# Patient Record
Sex: Female | Born: 1935 | Race: Black or African American | Hispanic: No | Smoking: Former smoker
Health system: Southern US, Community
[De-identification: ages and names within clinical notes are randomized; demographics above are authoritative.]

## PROBLEM LIST (undated history)

## (undated) DIAGNOSIS — R12 Heartburn: Secondary | ICD-10-CM

## (undated) DIAGNOSIS — K219 Gastro-esophageal reflux disease without esophagitis: Secondary | ICD-10-CM

## (undated) DIAGNOSIS — R51 Headache: Secondary | ICD-10-CM

## (undated) DIAGNOSIS — M1990 Unspecified osteoarthritis, unspecified site: Secondary | ICD-10-CM

## (undated) DIAGNOSIS — F419 Anxiety disorder, unspecified: Secondary | ICD-10-CM

## (undated) DIAGNOSIS — R0600 Dyspnea, unspecified: Secondary | ICD-10-CM

## (undated) DIAGNOSIS — N950 Postmenopausal bleeding: Secondary | ICD-10-CM

## (undated) DIAGNOSIS — M4310 Spondylolisthesis, site unspecified: Secondary | ICD-10-CM

## (undated) DIAGNOSIS — G4730 Sleep apnea, unspecified: Secondary | ICD-10-CM

## (undated) DIAGNOSIS — T7840XA Allergy, unspecified, initial encounter: Secondary | ICD-10-CM

## (undated) DIAGNOSIS — E039 Hypothyroidism, unspecified: Secondary | ICD-10-CM

## (undated) DIAGNOSIS — F0390 Unspecified dementia without behavioral disturbance: Secondary | ICD-10-CM

## (undated) DIAGNOSIS — C50919 Malignant neoplasm of unspecified site of unspecified female breast: Secondary | ICD-10-CM

## (undated) DIAGNOSIS — I10 Essential (primary) hypertension: Secondary | ICD-10-CM

## (undated) DIAGNOSIS — F329 Major depressive disorder, single episode, unspecified: Secondary | ICD-10-CM

## (undated) DIAGNOSIS — D649 Anemia, unspecified: Secondary | ICD-10-CM

## (undated) DIAGNOSIS — R252 Cramp and spasm: Secondary | ICD-10-CM

## (undated) DIAGNOSIS — R011 Cardiac murmur, unspecified: Secondary | ICD-10-CM

## (undated) DIAGNOSIS — G2581 Restless legs syndrome: Secondary | ICD-10-CM

## (undated) DIAGNOSIS — F32A Depression, unspecified: Secondary | ICD-10-CM

## (undated) DIAGNOSIS — N189 Chronic kidney disease, unspecified: Secondary | ICD-10-CM

## (undated) HISTORY — PX: OTHER SURGICAL HISTORY: SHX169

## (undated) HISTORY — DX: Spondylolisthesis, site unspecified: M43.10

## (undated) HISTORY — DX: Cramp and spasm: R25.2

## (undated) HISTORY — DX: Unspecified osteoarthritis, unspecified site: M19.90

## (undated) HISTORY — DX: Gastro-esophageal reflux disease without esophagitis: K21.9

## (undated) HISTORY — DX: Anxiety disorder, unspecified: F41.9

## (undated) HISTORY — DX: Restless legs syndrome: G25.81

## (undated) HISTORY — DX: Postmenopausal bleeding: N95.0

## (undated) HISTORY — DX: Hypothyroidism, unspecified: E03.9

## (undated) HISTORY — DX: Chronic kidney disease, unspecified: N18.9

## (undated) HISTORY — DX: Anemia, unspecified: D64.9

## (undated) HISTORY — DX: Major depressive disorder, single episode, unspecified: F32.9

## (undated) HISTORY — DX: Allergy, unspecified, initial encounter: T78.40XA

## (undated) HISTORY — DX: Cardiac murmur, unspecified: R01.1

## (undated) HISTORY — DX: Malignant neoplasm of unspecified site of unspecified female breast: C50.919

## (undated) HISTORY — DX: Depression, unspecified: F32.A

## (undated) HISTORY — DX: Heartburn: R12

## (undated) HISTORY — PX: BREAST LUMPECTOMY: SHX2

## (undated) HISTORY — DX: Essential (primary) hypertension: I10

## (undated) HISTORY — PX: CATARACT EXTRACTION, BILATERAL: SHX1313

## (undated) HISTORY — DX: Unspecified dementia, unspecified severity, without behavioral disturbance, psychotic disturbance, mood disturbance, and anxiety: F03.90

## (undated) HISTORY — PX: BREAST SURGERY: SHX581

---

## 2011-01-15 HISTORY — PX: BREAST SURGERY: SHX581

## 2013-09-13 LAB — HM MAMMOGRAPHY: HM Mammogram: NORMAL (ref 0–4)

## 2013-09-13 LAB — HM COLONOSCOPY

## 2014-01-22 DIAGNOSIS — I10 Essential (primary) hypertension: Secondary | ICD-10-CM | POA: Diagnosis not present

## 2014-01-22 DIAGNOSIS — M839 Adult osteomalacia, unspecified: Secondary | ICD-10-CM | POA: Diagnosis not present

## 2014-01-22 DIAGNOSIS — N184 Chronic kidney disease, stage 4 (severe): Secondary | ICD-10-CM | POA: Diagnosis not present

## 2014-01-25 DIAGNOSIS — I10 Essential (primary) hypertension: Secondary | ICD-10-CM | POA: Diagnosis not present

## 2014-01-25 DIAGNOSIS — M839 Adult osteomalacia, unspecified: Secondary | ICD-10-CM | POA: Diagnosis not present

## 2014-01-25 DIAGNOSIS — R7309 Other abnormal glucose: Secondary | ICD-10-CM | POA: Diagnosis not present

## 2014-01-25 DIAGNOSIS — N183 Chronic kidney disease, stage 3 (moderate): Secondary | ICD-10-CM | POA: Diagnosis not present

## 2014-01-25 DIAGNOSIS — E785 Hyperlipidemia, unspecified: Secondary | ICD-10-CM | POA: Diagnosis not present

## 2014-03-04 DIAGNOSIS — I10 Essential (primary) hypertension: Secondary | ICD-10-CM | POA: Diagnosis not present

## 2014-03-04 DIAGNOSIS — M25512 Pain in left shoulder: Secondary | ICD-10-CM | POA: Diagnosis not present

## 2014-03-19 DIAGNOSIS — R7309 Other abnormal glucose: Secondary | ICD-10-CM | POA: Diagnosis not present

## 2014-03-19 DIAGNOSIS — I10 Essential (primary) hypertension: Secondary | ICD-10-CM | POA: Diagnosis not present

## 2014-03-19 DIAGNOSIS — N183 Chronic kidney disease, stage 3 (moderate): Secondary | ICD-10-CM | POA: Diagnosis not present

## 2014-03-19 DIAGNOSIS — M839 Adult osteomalacia, unspecified: Secondary | ICD-10-CM | POA: Diagnosis not present

## 2014-03-19 DIAGNOSIS — E785 Hyperlipidemia, unspecified: Secondary | ICD-10-CM | POA: Diagnosis not present

## 2014-03-22 DIAGNOSIS — N183 Chronic kidney disease, stage 3 (moderate): Secondary | ICD-10-CM | POA: Diagnosis not present

## 2014-03-22 DIAGNOSIS — I10 Essential (primary) hypertension: Secondary | ICD-10-CM | POA: Diagnosis not present

## 2014-03-22 DIAGNOSIS — R7309 Other abnormal glucose: Secondary | ICD-10-CM | POA: Diagnosis not present

## 2014-03-22 DIAGNOSIS — M839 Adult osteomalacia, unspecified: Secondary | ICD-10-CM | POA: Diagnosis not present

## 2014-03-26 DIAGNOSIS — Z1231 Encounter for screening mammogram for malignant neoplasm of breast: Secondary | ICD-10-CM | POA: Diagnosis not present

## 2014-04-18 DIAGNOSIS — C50912 Malignant neoplasm of unspecified site of left female breast: Secondary | ICD-10-CM | POA: Diagnosis not present

## 2014-05-12 DIAGNOSIS — Z8669 Personal history of other diseases of the nervous system and sense organs: Secondary | ICD-10-CM | POA: Diagnosis not present

## 2014-05-12 DIAGNOSIS — H6122 Impacted cerumen, left ear: Secondary | ICD-10-CM | POA: Diagnosis not present

## 2014-05-12 DIAGNOSIS — R42 Dizziness and giddiness: Secondary | ICD-10-CM | POA: Diagnosis not present

## 2014-06-02 DIAGNOSIS — I10 Essential (primary) hypertension: Secondary | ICD-10-CM | POA: Diagnosis not present

## 2014-06-02 DIAGNOSIS — E785 Hyperlipidemia, unspecified: Secondary | ICD-10-CM | POA: Diagnosis not present

## 2014-06-02 DIAGNOSIS — H8113 Benign paroxysmal vertigo, bilateral: Secondary | ICD-10-CM | POA: Diagnosis not present

## 2014-06-02 DIAGNOSIS — L749 Eccrine sweat disorder, unspecified: Secondary | ICD-10-CM | POA: Diagnosis not present

## 2014-06-02 DIAGNOSIS — M25512 Pain in left shoulder: Secondary | ICD-10-CM | POA: Diagnosis not present

## 2014-09-14 DIAGNOSIS — M25551 Pain in right hip: Secondary | ICD-10-CM | POA: Diagnosis not present

## 2014-09-14 DIAGNOSIS — Z853 Personal history of malignant neoplasm of breast: Secondary | ICD-10-CM | POA: Diagnosis not present

## 2014-09-14 DIAGNOSIS — G2581 Restless legs syndrome: Secondary | ICD-10-CM | POA: Diagnosis not present

## 2014-09-14 DIAGNOSIS — I10 Essential (primary) hypertension: Secondary | ICD-10-CM | POA: Diagnosis not present

## 2014-10-04 DIAGNOSIS — M170 Bilateral primary osteoarthritis of knee: Secondary | ICD-10-CM | POA: Diagnosis not present

## 2014-10-05 DIAGNOSIS — M222X2 Patellofemoral disorders, left knee: Secondary | ICD-10-CM | POA: Diagnosis not present

## 2014-10-05 DIAGNOSIS — M222X1 Patellofemoral disorders, right knee: Secondary | ICD-10-CM | POA: Diagnosis not present

## 2014-10-05 DIAGNOSIS — F338 Other recurrent depressive disorders: Secondary | ICD-10-CM | POA: Diagnosis not present

## 2014-10-05 DIAGNOSIS — R262 Difficulty in walking, not elsewhere classified: Secondary | ICD-10-CM | POA: Diagnosis not present

## 2014-10-07 DIAGNOSIS — E2839 Other primary ovarian failure: Secondary | ICD-10-CM | POA: Diagnosis not present

## 2014-10-07 DIAGNOSIS — G2581 Restless legs syndrome: Secondary | ICD-10-CM | POA: Diagnosis not present

## 2014-10-07 DIAGNOSIS — F419 Anxiety disorder, unspecified: Secondary | ICD-10-CM | POA: Diagnosis not present

## 2014-10-07 DIAGNOSIS — I10 Essential (primary) hypertension: Secondary | ICD-10-CM | POA: Diagnosis not present

## 2014-10-07 DIAGNOSIS — Z0000 Encounter for general adult medical examination without abnormal findings: Secondary | ICD-10-CM | POA: Diagnosis not present

## 2014-10-07 HISTORY — DX: Restless legs syndrome: G25.81

## 2014-10-10 DIAGNOSIS — F338 Other recurrent depressive disorders: Secondary | ICD-10-CM | POA: Diagnosis not present

## 2014-10-10 DIAGNOSIS — R262 Difficulty in walking, not elsewhere classified: Secondary | ICD-10-CM | POA: Diagnosis not present

## 2014-10-10 DIAGNOSIS — M222X2 Patellofemoral disorders, left knee: Secondary | ICD-10-CM | POA: Diagnosis not present

## 2014-10-10 DIAGNOSIS — M222X1 Patellofemoral disorders, right knee: Secondary | ICD-10-CM | POA: Diagnosis not present

## 2014-10-12 DIAGNOSIS — R262 Difficulty in walking, not elsewhere classified: Secondary | ICD-10-CM | POA: Diagnosis not present

## 2014-10-12 DIAGNOSIS — M222X2 Patellofemoral disorders, left knee: Secondary | ICD-10-CM | POA: Diagnosis not present

## 2014-10-12 DIAGNOSIS — M222X1 Patellofemoral disorders, right knee: Secondary | ICD-10-CM | POA: Diagnosis not present

## 2014-10-12 DIAGNOSIS — F338 Other recurrent depressive disorders: Secondary | ICD-10-CM | POA: Diagnosis not present

## 2014-10-13 ENCOUNTER — Other Ambulatory Visit: Payer: Self-pay | Admitting: Family Medicine

## 2014-10-13 DIAGNOSIS — E2839 Other primary ovarian failure: Secondary | ICD-10-CM

## 2014-10-19 DIAGNOSIS — M222X1 Patellofemoral disorders, right knee: Secondary | ICD-10-CM | POA: Diagnosis not present

## 2014-10-19 DIAGNOSIS — F338 Other recurrent depressive disorders: Secondary | ICD-10-CM | POA: Diagnosis not present

## 2014-10-19 DIAGNOSIS — M222X2 Patellofemoral disorders, left knee: Secondary | ICD-10-CM | POA: Diagnosis not present

## 2014-10-19 DIAGNOSIS — R262 Difficulty in walking, not elsewhere classified: Secondary | ICD-10-CM | POA: Diagnosis not present

## 2014-10-25 DIAGNOSIS — R262 Difficulty in walking, not elsewhere classified: Secondary | ICD-10-CM | POA: Diagnosis not present

## 2014-10-25 DIAGNOSIS — M222X1 Patellofemoral disorders, right knee: Secondary | ICD-10-CM | POA: Diagnosis not present

## 2014-10-25 DIAGNOSIS — M222X2 Patellofemoral disorders, left knee: Secondary | ICD-10-CM | POA: Diagnosis not present

## 2014-10-25 DIAGNOSIS — F338 Other recurrent depressive disorders: Secondary | ICD-10-CM | POA: Diagnosis not present

## 2014-10-26 DIAGNOSIS — R2681 Unsteadiness on feet: Secondary | ICD-10-CM | POA: Diagnosis not present

## 2014-10-26 DIAGNOSIS — R42 Dizziness and giddiness: Secondary | ICD-10-CM | POA: Diagnosis not present

## 2014-10-26 DIAGNOSIS — Z23 Encounter for immunization: Secondary | ICD-10-CM | POA: Diagnosis not present

## 2014-10-26 DIAGNOSIS — R51 Headache: Secondary | ICD-10-CM | POA: Diagnosis not present

## 2014-10-26 DIAGNOSIS — I10 Essential (primary) hypertension: Secondary | ICD-10-CM | POA: Diagnosis not present

## 2014-10-31 ENCOUNTER — Ambulatory Visit
Admission: RE | Admit: 2014-10-31 | Discharge: 2014-10-31 | Disposition: A | Discharge status: Home / Self Care | Payer: Medicare Other | Source: Ambulatory Visit | Attending: Family Medicine | Admitting: Family Medicine

## 2014-10-31 DIAGNOSIS — E2839 Other primary ovarian failure: Secondary | ICD-10-CM

## 2014-10-31 DIAGNOSIS — Z780 Asymptomatic menopausal state: Secondary | ICD-10-CM | POA: Diagnosis not present

## 2014-10-31 LAB — HM DEXA SCAN

## 2014-11-10 ENCOUNTER — Other Ambulatory Visit: Payer: Self-pay | Admitting: Family Medicine

## 2014-11-10 DIAGNOSIS — R51 Headache: Principal | ICD-10-CM

## 2014-11-10 DIAGNOSIS — R519 Headache, unspecified: Secondary | ICD-10-CM

## 2014-11-10 DIAGNOSIS — R2681 Unsteadiness on feet: Secondary | ICD-10-CM

## 2014-11-10 DIAGNOSIS — R42 Dizziness and giddiness: Secondary | ICD-10-CM

## 2014-11-14 ENCOUNTER — Other Ambulatory Visit: Payer: Self-pay | Admitting: Family Medicine

## 2014-11-14 DIAGNOSIS — R51 Headache: Principal | ICD-10-CM

## 2014-11-14 DIAGNOSIS — R519 Headache, unspecified: Secondary | ICD-10-CM

## 2014-11-14 DIAGNOSIS — R2681 Unsteadiness on feet: Secondary | ICD-10-CM

## 2014-11-14 DIAGNOSIS — R42 Dizziness and giddiness: Secondary | ICD-10-CM

## 2014-12-19 ENCOUNTER — Other Ambulatory Visit: Payer: Medicare Other

## 2014-12-19 ENCOUNTER — Ambulatory Visit (INDEPENDENT_AMBULATORY_CARE_PROVIDER_SITE_OTHER): Payer: Medicare Other | Admitting: Family

## 2014-12-19 ENCOUNTER — Encounter: Payer: Self-pay | Admitting: Family

## 2014-12-19 VITALS — BP 142/68 | HR 56 | Temp 97.8°F | Resp 16 | Ht 62.0 in | Wt 202.8 lb

## 2014-12-19 DIAGNOSIS — R350 Frequency of micturition: Secondary | ICD-10-CM

## 2014-12-19 DIAGNOSIS — M62830 Muscle spasm of back: Secondary | ICD-10-CM | POA: Insufficient documentation

## 2014-12-19 LAB — POCT URINALYSIS DIPSTICK
Bilirubin, UA: NEGATIVE
Blood, UA: NEGATIVE
Glucose, UA: NEGATIVE
Leukocytes, UA: NEGATIVE
Nitrite, UA: NEGATIVE
Protein, UA: NEGATIVE
Spec Grav, UA: 1.025
Urobilinogen, UA: NEGATIVE
pH, UA: 5.5

## 2014-12-19 MED ORDER — LOSARTAN POTASSIUM 100 MG PO TABS: 100.0000 mg | ORAL_TABLET | Freq: Every day | ORAL | Status: DC

## 2014-12-19 NOTE — Progress Notes (Signed)
Subjective:    Patient ID: Regina Porter, female    DOB: 1934-08-01, 80 y.o.   MRN: [id number]  Chief Complaint  Patient presents with  . Establish Care    urinary frequency and back pain that goes across the middle of her back that radiates down to both of her hips, pain has been going on for months and does not know if it is muscle spasms    HPI:  Regina Porter is a 80 y.o. female who  has a past medical history of Thyroid disease; Hypertension; Depression; and Breast cancer ([place]). and presents today for an office visit to establish care.    1.) Back pain - Associated symptom of pain located in her upper back across her shoulder blades and occurs when she starts lifting something and has been going on for about 6 months. Pain is described as sharp that comes and goes. Modifying factors include Aleve which does help ease the the discomfort as well as sitting and resting. Severity is enough to take her breath away at times. Denies any trauma or sounds/sensations that were heard or felt. Notes that she is also has some lower back pain and urinary frequency.    No Known Allergies   No outpatient prescriptions prior to visit.   No facility-administered medications prior to visit.     Past Medical History  Diagnosis Date  . Thyroid disease   . Hypertension   . Depression   . Breast cancer ([place])      History reviewed. No pertinent past surgical history.   Family History  Problem Relation Age of Onset  . Hypertension Mother   . Arthritis Mother   . Hypertension Father      Social History   Social History  . Marital Status: Single    Spouse Name: N/A  . Number of Children: 5  . Years of Education: 14   Occupational History  . Retired    Social History Main Topics  . Smoking status: Former Smoker -- 0.10 packs/day for 20 years  . Smokeless tobacco: Never Used  . Alcohol Use: No  . Drug Use: No  . Sexual Activity: Not on file   Other Topics Concern  . Not on file    Social History Narrative   Denies abuse and feels safe at home.      Review of Systems  Constitutional: Negative for fever and chills.  Genitourinary: Positive for frequency. Negative for dysuria, hematuria and flank pain.  Musculoskeletal: Positive for back pain and neck stiffness.  Neurological: Negative for weakness, numbness and headaches.      Objective:    BP 142/68 mmHg  Pulse 56  Temp(Src) 97.8 F (36.6 C) (Oral)  Resp 16  Ht 5\' 2"  (1.575 m)  Wt 202 lb 12.8 oz (91.989 kg)  BMI 37.08 kg/m2  SpO2 97% Nursing note and vital signs reviewed.  Physical Exam  Constitutional: She is oriented to person, place, and time. She appears well-developed and well-nourished. No distress.  Cardiovascular: Normal rate, regular rhythm, normal heart sounds and intact distal pulses.   Pulmonary/Chest: Effort normal and breath sounds normal.  Musculoskeletal:       Back:  Neurological: She is alert and oriented to person, place, and time.  Skin: Skin is warm and dry.  Psychiatric: She has a normal mood and affect. Her behavior is normal. Judgment and thought content normal.       Assessment & Plan:   Problem List Items Addressed This Visit  Other   Muscle spasm of back    Symptoms and exam consistent with mid/upper back muscle spasm. Continue previously prescribed tizanidine. Recommend conservative treatment with moist heat and home exercise therapy. Follow up if symptoms worsen or fail to improve for further imaging.       Urinary frequency - Primary    In office UA negative for leukocytes, nitrites and hematuria. Urine sent for culture. Unlikely cystitis with antibiotics if needed pending urine culture. Cannot rule out overactive bladder. Follow up pending urine culture results.       Relevant Orders   POCT urinalysis dipstick (Completed)   Urine culture

## 2014-12-19 NOTE — Assessment & Plan Note (Signed)
Symptoms and exam consistent with mid/upper back muscle spasm. Continue previously prescribed tizanidine. Recommend conservative treatment with moist heat and home exercise therapy. Follow up if symptoms worsen or fail to improve for further imaging.

## 2014-12-19 NOTE — Patient Instructions (Addendum)
Thank you for choosing [facility].  Summary/Instructions:  Your prescription(s) have been submitted to your pharmacy or been printed and provided for you. Please take as directed and contact our office if you believe you are having problem(s) with the medication(s) or have any questions.  If your symptoms worsen or fail to improve, please contact our office for further instruction, or in case of emergency go directly to the emergency room at the closest medical facility.   Please use moist heat, icy/hot, and continue to take your muscle relaxor as needed.   Mid-Back Strain With Rehab  A strain is an injury in which a tendon or muscle is torn. The muscles and tendons of the mid-back are vulnerable to strains. However, these muscles and tendons are very strong and require a great force to be injured. The muscles of the mid-back are responsible for stabilizing the spinal column, as well as spinal twisting (rotation). Strains are classified into three categories. Grade 1 strains cause pain, but the tendon is not lengthened. Grade 2 strains include a lengthened ligament, due to the ligament being stretched or partially ruptured. With grade 2 strains there is still function, although the function may be decreased. Grade 3 strains involve a complete tear of the tendon or muscle, and function is usually impaired. SYMPTOMS   Pain in the middle of the back.  Pain that may affect only one side, and is worse with movement.  Muscle spasms, and often swelling in the back.  Loss of strength of the back muscles.  Crackling sound (crepitation) when the muscles are touched. CAUSES  Mid-back strains occur when a force is placed on the muscles or tendons that is greater than they can handle. Common causes of injury include:  Ongoing overuse of the muscle-tendon units in the middle back, usually from incorrect body posture.  A single violent injury or force applied to the back. RISK INCREASES  WITH:  Sports that involve twisting forces on the spine or a lot of bending at the waist (football, rugby, weightlifting, bowling, golf, tennis, speed skating, racquetball, swimming, running, gymnastics, diving).  Poor strength and flexibility.  Failure to warm up properly before activity.  Family history of low back pain or disk disorders.  Previous back injury or surgery (especially fusion). PREVENTION  Learn and use proper sports technique.  Warm up and stretch properly before activity.  Allow for adequate recovery between workouts.  Maintain physical fitness:  Strength, flexibility, and endurance.  Cardiovascular fitness. PROGNOSIS  If treated properly, mid-back strains usually heal within 6 weeks. RELATED COMPLICATIONS   Frequently recurring symptoms, resulting in a chronic problem. Properly treating the problem the first time decreases frequency of recurrence.  Chronic inflammation, scarring, and partial muscle-tendon tear.  Delayed healing or resolution of symptoms, especially if activity is resumed too soon.  Prolonged disability. TREATMENT Treatment first involves the use of ice and medicine, to reduce pain and inflammation. As the pain begins to subside, you may begin strengthening and stretching exercises to improve body posture and sport technique. These exercises may be performed at home or with a therapist. Severe injuries may require referral to a therapist for further evaluation and treatment, such as ultrasound. Corticosteroid injections may be given to help reduce inflammation. Biofeedback (watching monitors of your body processes) and psychotherapy may also be prescribed. Prolonged bed rest is felt to do more harm than good. Massage may help break the muscle spasms. Sometimes, an injection of cortisone, with or without local anesthetics, may be  given to help relieve the pain and spasms. MEDICATION   If pain medicine is needed, nonsteroidal anti-inflammatory  medicines (aspirin and ibuprofen), or other minor pain relievers (acetaminophen), are often advised.  Do not take pain medicine for 7 days before surgery.  Prescription pain relievers may be given, if your caregiver thinks they are needed. Use only as directed and only as much as you need.  Ointments applied to the skin may be helpful.  Corticosteroid injections may be given by your caregiver. These injections should be reserved for the most serious cases, because they may only be given a certain number of times. HEAT AND COLD:   Cold treatment (icing) should be applied for 10 to 15 minutes every 2 to 3 hours for inflammation and pain, and immediately after activity that aggravates your symptoms. Use ice packs or an ice massage.  Heat treatment may be used before performing stretching and strengthening activities prescribed by your caregiver, physical therapist, or athletic trainer. Use a heat pack or a warm water soak. SEEK IMMEDIATE MEDICAL CARE IF:  Symptoms get worse or do not improve in 2 to 4 weeks, despite treatment.  You develop numbness, weakness, or loss of bowel or bladder function.  New, unexplained symptoms develop. (Drugs used in treatment may produce side effects.) EXERCISES RANGE OF MOTION (ROM) AND STRETCHING EXERCISES - Mid-Back Strain These exercises may help you when beginning to rehabilitate your injury. In order to successfully resolve your symptoms, you must improve your posture. These exercises are designed to help reduce the forward-head and rounded-shoulder posture which contributes to this condition. Your symptoms may resolve with or without further involvement from your physician, physical therapist or athletic trainer. While completing these exercises, remember:   Restoring tissue flexibility helps normal motion to return to the joints. This allows healthier, less painful movement and activity.  An effective stretch should be held for at least 30 seconds.  A  stretch should never be painful. You should only feel a gentle lengthening or release in the stretched tissue. STRETCH - Axial Extension  Stand or sit on a firm surface. Assume a good posture: chest up, shoulders drawn back, stomach muscles slightly tense, knees unlocked (if standing) and feet hip width apart.  Slowly retract your chin, so your head slides back and your chin slightly lowers. Continue to look straight ahead.  You should feel a gentle stretch in the back of your head. Be certain not to feel an aggressive stretch since this can cause headaches later.  Hold for __________ seconds. Repeat __________ times. Complete this exercise __________ times per day. RANGE OF MOTION- Upper Thoracic Extension  Sit on a firm chair with a high back. Assume a good posture: chest up, shoulders drawn back, abdominal muscles slightly tense, and feet hip width apart. Place a small pillow or folded towel in the curve of your lower back, if you are having difficulty maintaining good posture.  Gently brace your neck with your hands, allowing your arms to rest on your chest.  Continue to support your neck and slowly extend your back over the chair. You will feel a stretch across your upper back.  Hold __________ seconds. Slowly return to the starting position. Repeat __________ times. Complete this exercise __________ times per day. RANGE OF MOTION- Mid-Thoracic Extension  Roll a towel so that it is about 4 inches in diameter.  Position the towel lengthwise. Lay on the towel so that your spine, but not your shoulder blades, are supported.  You  should feel your mid-back arching toward the floor. To increase the stretch, extend your arms away from your body.  Hold for __________ seconds. Repeat exercise __________ times, __________ times per day. STRENGTHENING EXERCISES - Mid-Back Strain These exercises may help you when beginning to rehabilitate your injury. They may resolve your symptoms with or  without further involvement from your physician, physical therapist or athletic trainer. While completing these exercises, remember:   Muscles can gain both the endurance and the strength needed for everyday activities through controlled exercises.  Complete these exercises as instructed by your physician, physical therapist or athletic trainer. Increase the resistance and repetitions only as guided by your caregiver.  You may experience muscle soreness or fatigue, but the pain or discomfort you are trying to eliminate should never worsen during these exercises. If this pain does worsen, stop and make certain you are following the directions exactly. If the pain is still present after adjustments, discontinue the exercise until you can discuss the trouble with your caregiver. STRENGTHENING - Quadruped, Opposite UE/LE Lift  Assume a hands and knees position on a firm surface. Keep your hands under your shoulders and your knees under your hips. You may place padding under your knees for comfort.  Find your neutral spine and gently tense your abdominal muscles so that you can maintain this position. Your shoulders and hips should form a rectangle that is parallel with the floor and is not twisted.  Keeping your trunk steady, lift your right hand no higher than your shoulder and then your left leg no higher than your hip. Make sure you are not holding your breath. Hold this position __________ seconds.  Continuing to keep your abdominal muscles tense and your back steady, slowly return to your starting position. Repeat with the opposite arm and leg. Repeat __________ times. Complete this exercise __________ times per day.  STRENGTH - Shoulder Extensors  Secure a rubber exercise band or tubing to a fixed object (table, pole) so that it is at the height of your shoulders when you are either standing, or sitting on a firm armless chair.  With a thumbs-up grip, grasp an end of the band in each hand.  Straighten your elbows and lift your hands straight in front of you at shoulder height. Step back away from the secured end of band, until it becomes tense.  Squeezing your shoulder blades together, pull your hands down to the sides of your thighs. Do not allow your hands to go behind you.  Hold for __________ seconds. Slowly ease the tension on the band, as you reverse the directions and return to the starting position. Repeat __________ times. Complete this exercise __________ times per day.  STRENGTH - Horizontal Abductors Choose one of the two positions to complete this exercise. Prone: lying on stomach:  Lie on your stomach on a firm surface so that your right / left arm overhangs the edge. Rest your forehead on your opposite forearm. With your palm facing the floor and your elbow straight, hold a __________ weight in your hand.  Squeeze your right / left shoulder blade to your mid-back spine and then slowly raise your arm to the height of the bed.  Hold for __________ seconds. Slowly reverse the directions and return to the starting position, controlling the weight as you lower your arm. Repeat __________ times. Complete this exercise __________ times per day. Standing:   Secure a rubber exercise band or tubing, so that it is at the height of your  shoulders when you are either standing, or sitting on a firm armless chair.  Grasp an end of the band in each hand and have your palms face each other. Straighten your elbows and lift your hands straight in front of you at shoulder height. Step back away from the secured end of band, until it becomes tense.  Squeeze your shoulder blades together. Keeping your elbows locked and your hands at shoulder height, spread your arms apart, forming a "T" shape with your body. Hold __________ seconds. Slowly ease the tension on the band, as you reverse the directions and return to the starting position. Repeat __________ times. Complete this exercise  __________ times per day. STRENGTH - Scapular Retractors and External Rotators, Rowing  Secure a rubber exercise band or tubing, so that it is at the height of your shoulders when you are either standing, or sitting on a firm armless chair.  With a palm-down grip, grasp an end of the band in each hand. Straighten your elbows and lift your hands straight in front of you at shoulder height. Step back away from the secured end of band, until it becomes tense.  Step 1: Squeeze your shoulder blades together. Bending your elbows, draw your hands to your chest as if you are rowing a boat. At the end of this motion, your hands and elbow should be at shoulder height and your elbows should be out to your sides.  Step 2: Rotate your shoulder to raise your hands above your head. Your forearms should be vertical and your upper arms should be horizontal.  Hold for __________ seconds. Slowly ease the tension on the band, as you reverse the directions and return to the starting position. Repeat __________ times. Complete this exercise __________ times per day.  POSTURE AND BODY MECHANICS CONSIDERATIONS - Mid-Back Strain Keeping correct posture when sitting, standing or completing your activities will reduce the stress put on different body tissues, allowing injured tissues a chance to heal and limiting painful experiences. The following are general guidelines for improved posture. Your physician or physical therapist will provide you with any instructions specific to your needs. While reading these guidelines, remember:  The exercises prescribed by your provider will help you have the flexibility and strength to maintain correct postures.  The correct posture provides the best environment for your joints to work. All of your joints have less wear and tear when properly supported by a spine with good posture. This means you will experience a healthier, less painful body.  Correct posture must be practiced with  all of your activities, especially prolonged sitting and standing. Correct posture is as important when doing repetitive low-stress activities (typing) as it is when doing a single heavy-load activity (lifting). PROPER SITTING POSTURE In order to minimize stress and discomfort on your spine, you must sit with correct posture. Sitting with good posture should be effortless for a healthy body. Returning to good posture is a gradual process. Many people can work toward this most comfortably by using various supports until they have the flexibility and strength to maintain this posture on their own. When sitting with proper posture, your ears will fall over your shoulders and your shoulders will fall over your hips. You should use the back of the chair to support your upper back. Your lower back will be in a neutral position, just slightly arched. You may place a small pillow or folded towel at the base of your low back for  support.  When working at  a desk, create an environment that supports good, upright posture. Without extra support, muscles fatigue and lead to excessive strain on joints and other tissues. Keep these recommendations in mind: CHAIR:  A chair should be able to slide under your desk when your back makes contact with the back of the chair. This allows you to work closely.  The chair's height should allow your eyes to be level with the upper part of your monitor and your hands to be slightly lower than your elbows. BODY POSITION  Your feet should make contact with the floor. If this is not possible, use a foot rest.  Keep your ears over your shoulders. This will reduce stress on your neck and lower back. INCORRECT SITTING POSTURES If you are feeling tired and unable to assume a healthy sitting posture, do not slouch or slump. This puts excessive strain on your back tissues, causing more damage and pain. Healthier options include:  Using more support, like a lumbar pillow.  Switching  tasks to something that requires you to be upright or walking.  Talking a brief walk.  Lying down to rest in a neutral-spine position. CORRECT STANDING POSTURES Proper standing posture should be assumed with all daily activities, even if they only take a few moments, like when brushing your teeth. As in sitting, your ears should fall over your shoulders and your shoulders should fall over your hips. You should keep a slight tension in your abdominal muscles to brace your spine. Your tailbone should point down to the ground, not behind your body, resulting in an over-extended swayback posture.  INCORRECT STANDING POSTURES Common incorrect standing postures include a forward head, locked knees, and an excessive swayback. WALKING Walk with an upright posture. Your ears, shoulders and hips should all line-up. CORRECT LIFTING TECHNIQUES DO :   Assume a wide stance. This will provide you more stability and the opportunity to get as close as possible to the object which you are lifting.  Tense your abdominals to brace your spine. Bend at the knees and hips. Keeping your back locked in a neutral-spine position, lift using your leg muscles. Lift with your legs, keeping your back straight.  Test the weight of unknown objects before attempting to lift them.  Try to keep your elbows locked down at your sides in order get the best strength from your shoulders when carrying an object.  Always ask for help when lifting heavy or awkward objects. INCORRECT LIFTING TECHNIQUES DO NOT:   Lock your knees when lifting, even if it is a small object.  Bend and twist. Pivot at your feet or move your feet when needing to change directions.  Assume that you can safely pick up even a paperclip without proper posture.   This information is not intended to replace advice given to you by your health care provider. Make sure you discuss any questions you have with your health care provider.   Document Released:  12/31/2004 Document Revised: 05/17/2014 Document Reviewed: 04/14/2008 [employer] Interactive Patient Education 2016 [employer]  Cervical Strain and Sprain With Rehab Cervical strain and sprain are injuries that commonly occur with "whiplash" injuries. Whiplash occurs when the neck is forcefully whipped backward or forward, such as during a motor vehicle accident or during contact sports. The muscles, ligaments, tendons, discs, and nerves of the neck are susceptible to injury when this occurs. RISK FACTORS Risk of having a whiplash injury increases if:  Osteoarthritis of the spine.  Situations that make head or neck  accidents or trauma more likely.  High-risk sports (football, rugby, wrestling, hockey, auto racing, gymnastics, diving, contact karate, or boxing).  Poor strength and flexibility of the neck.  Previous neck injury.  Poor tackling technique.  Improperly fitted or padded equipment. SYMPTOMS   Pain or stiffness in the front or back of neck or both.  Symptoms may present immediately or up to 24 hours after injury.  Dizziness, headache, nausea, and vomiting.  Muscle spasm with soreness and stiffness in the neck.  Tenderness and swelling at the injury site. PREVENTION  Learn and use proper technique (avoid tackling with the head, spearing, and head-butting; use proper falling techniques to avoid landing on the head).  Warm up and stretch properly before activity.  Maintain physical fitness:  Strength, flexibility, and endurance.  Cardiovascular fitness.  Wear properly fitted and padded protective equipment, such as padded soft collars, for participation in contact sports. PROGNOSIS  Recovery from cervical strain and sprain injuries is dependent on the extent of the injury. These injuries are usually curable in 1 week to 3 months with appropriate treatment.  RELATED COMPLICATIONS   Temporary numbness and weakness may occur if the nerve roots are damaged, and  this may persist until the nerve has completely healed.  Chronic pain due to frequent recurrence of symptoms.  Prolonged healing, especially if activity is resumed too soon (before complete recovery). TREATMENT  Treatment initially involves the use of ice and medication to help reduce pain and inflammation. It is also important to perform strengthening and stretching exercises and modify activities that worsen symptoms so the injury does not get worse. These exercises may be performed at home or with a therapist. For patients who experience severe symptoms, a soft, padded collar may be recommended to be worn around the neck.  Improving your posture may help reduce symptoms. Posture improvement includes pulling your chin and abdomen in while sitting or standing. If you are sitting, sit in a firm chair with your buttocks against the back of the chair. While sleeping, try replacing your pillow with a small towel rolled to 2 inches in diameter, or use a cervical pillow or soft cervical collar. Poor sleeping positions delay healing.  For patients with nerve root damage, which causes numbness or weakness, the use of a cervical traction apparatus may be recommended. Surgery is rarely necessary for these injuries. However, cervical strain and sprains that are present at birth (congenital) may require surgery. MEDICATION   If pain medication is necessary, nonsteroidal anti-inflammatory medications, such as aspirin and ibuprofen, or other minor pain relievers, such as acetaminophen, are often recommended.  Do not take pain medication for 7 days before surgery.  Prescription pain relievers may be given if deemed necessary by your caregiver. Use only as directed and only as much as you need. HEAT AND COLD:   Cold treatment (icing) relieves pain and reduces inflammation. Cold treatment should be applied for 10 to 15 minutes every 2 to 3 hours for inflammation and pain and immediately after any activity that  aggravates your symptoms. Use ice packs or an ice massage.  Heat treatment may be used prior to performing the stretching and strengthening activities prescribed by your caregiver, physical therapist, or athletic trainer. Use a heat pack or a warm soak. SEEK MEDICAL CARE IF:   Symptoms get worse or do not improve in 2 weeks despite treatment.  New, unexplained symptoms develop (drugs used in treatment may produce side effects). EXERCISES RANGE OF MOTION (ROM) AND STRETCHING EXERCISES -  Cervical Strain and Sprain These exercises may help you when beginning to rehabilitate your injury. In order to successfully resolve your symptoms, you must improve your posture. These exercises are designed to help reduce the forward-head and rounded-shoulder posture which contributes to this condition. Your symptoms may resolve with or without further involvement from your physician, physical therapist or athletic trainer. While completing these exercises, remember:   Restoring tissue flexibility helps normal motion to return to the joints. This allows healthier, less painful movement and activity.  An effective stretch should be held for at least 20 seconds, although you may need to begin with shorter hold times for comfort.  A stretch should never be painful. You should only feel a gentle lengthening or release in the stretched tissue. STRETCH- Axial Extensors  Lie on your back on the floor. You may bend your knees for comfort. Place a rolled-up hand towel or dish towel, about 2 inches in diameter, under the part of your head that makes contact with the floor.  Gently tuck your chin, as if trying to make a "double chin," until you feel a gentle stretch at the base of your head.  Hold __________ seconds. Repeat __________ times. Complete this exercise __________ times per day.  STRETCH - Axial Extension   Stand or sit on a firm surface. Assume a good posture: chest up, shoulders drawn back, abdominal  muscles slightly tense, knees unlocked (if standing) and feet hip width apart.  Slowly retract your chin so your head slides back and your chin slightly lowers. Continue to look straight ahead.  You should feel a gentle stretch in the back of your head. Be certain not to feel an aggressive stretch since this can cause headaches later.  Hold for __________ seconds. Repeat __________ times. Complete this exercise __________ times per day. STRETCH - Cervical Side Bend   Stand or sit on a firm surface. Assume a good posture: chest up, shoulders drawn back, abdominal muscles slightly tense, knees unlocked (if standing) and feet hip width apart.  Without letting your nose or shoulders move, slowly tip your right / left ear to your shoulder until your feel a gentle stretch in the muscles on the opposite side of your neck.  Hold __________ seconds. Repeat __________ times. Complete this exercise __________ times per day. STRETCH - Cervical Rotators   Stand or sit on a firm surface. Assume a good posture: chest up, shoulders drawn back, abdominal muscles slightly tense, knees unlocked (if standing) and feet hip width apart.  Keeping your eyes level with the ground, slowly turn your head until you feel a gentle stretch along the back and opposite side of your neck.  Hold __________ seconds. Repeat __________ times. Complete this exercise __________ times per day. RANGE OF MOTION - Neck Circles   Stand or sit on a firm surface. Assume a good posture: chest up, shoulders drawn back, abdominal muscles slightly tense, knees unlocked (if standing) and feet hip width apart.  Gently roll your head down and around from the back of one shoulder to the back of the other. The motion should never be forced or painful.  Repeat the motion 10-20 times, or until you feel the neck muscles relax and loosen. Repeat __________ times. Complete the exercise __________ times per day. STRENGTHENING EXERCISES -  Cervical Strain and Sprain These exercises may help you when beginning to rehabilitate your injury. They may resolve your symptoms with or without further involvement from your physician, physical therapist, or athletic trainer.  While completing these exercises, remember:   Muscles can gain both the endurance and the strength needed for everyday activities through controlled exercises.  Complete these exercises as instructed by your physician, physical therapist, or athletic trainer. Progress the resistance and repetitions only as guided.  You may experience muscle soreness or fatigue, but the pain or discomfort you are trying to eliminate should never worsen during these exercises. If this pain does worsen, stop and make certain you are following the directions exactly. If the pain is still present after adjustments, discontinue the exercise until you can discuss the trouble with your clinician. STRENGTH - Cervical Flexors, Isometric  Face a wall, standing about 6 inches away. Place a small pillow, a ball about 6-8 inches in diameter, or a folded towel between your forehead and the wall.  Slightly tuck your chin and gently push your forehead into the soft object. Push only with mild to moderate intensity, building up tension gradually. Keep your jaw and forehead relaxed.  Hold 10 to 20 seconds. Keep your breathing relaxed.  Release the tension slowly. Relax your neck muscles completely before you start the next repetition. Repeat __________ times. Complete this exercise __________ times per day. STRENGTH- Cervical Lateral Flexors, Isometric   Stand about 6 inches away from a wall. Place a small pillow, a ball about 6-8 inches in diameter, or a folded towel between the side of your head and the wall.  Slightly tuck your chin and gently tilt your head into the soft object. Push only with mild to moderate intensity, building up tension gradually. Keep your jaw and forehead relaxed.  Hold 10 to  20 seconds. Keep your breathing relaxed.  Release the tension slowly. Relax your neck muscles completely before you start the next repetition. Repeat __________ times. Complete this exercise __________ times per day. STRENGTH - Cervical Extensors, Isometric   Stand about 6 inches away from a wall. Place a small pillow, a ball about 6-8 inches in diameter, or a folded towel between the back of your head and the wall.  Slightly tuck your chin and gently tilt your head back into the soft object. Push only with mild to moderate intensity, building up tension gradually. Keep your jaw and forehead relaxed.  Hold 10 to 20 seconds. Keep your breathing relaxed.  Release the tension slowly. Relax your neck muscles completely before you start the next repetition. Repeat __________ times. Complete this exercise __________ times per day. POSTURE AND BODY MECHANICS CONSIDERATIONS - Cervical Strain and Sprain Keeping correct posture when sitting, standing or completing your activities will reduce the stress put on different body tissues, allowing injured tissues a chance to heal and limiting painful experiences. The following are general guidelines for improved posture. Your physician or physical therapist will provide you with any instructions specific to your needs. While reading these guidelines, remember:  The exercises prescribed by your provider will help you have the flexibility and strength to maintain correct postures.  The correct posture provides the optimal environment for your joints to work. All of your joints have less wear and tear when properly supported by a spine with good posture. This means you will experience a healthier, less painful body.  Correct posture must be practiced with all of your activities, especially prolonged sitting and standing. Correct posture is as important when doing repetitive low-stress activities (typing) as it is when doing a single heavy-load activity  (lifting). PROLONGED STANDING WHILE SLIGHTLY LEANING FORWARD When completing a task that requires you to  lean forward while standing in one place for a long time, place either foot up on a stationary 2- to 4-inch high object to help maintain the best posture. When both feet are on the ground, the low back tends to lose its slight inward curve. If this curve flattens (or becomes too large), then the back and your other joints will experience too much stress, fatigue more quickly, and can cause pain.  RESTING POSITIONS Consider which positions are most painful for you when choosing a resting position. If you have pain with flexion-based activities (sitting, bending, stooping, squatting), choose a position that allows you to rest in a less flexed posture. You would want to avoid curling into a fetal position on your side. If your pain worsens with extension-based activities (prolonged standing, working overhead), avoid resting in an extended position such as sleeping on your stomach. Most people will find more comfort when they rest with their spine in a more neutral position, neither too rounded nor too arched. Lying on a non-sagging bed on your side with a pillow between your knees, or on your back with a pillow under your knees will often provide some relief. Keep in mind, being in any one position for a prolonged period of time, no matter how correct your posture, can still lead to stiffness. WALKING Walk with an upright posture. Your ears, shoulders, and hips should all line up. OFFICE WORK When working at a desk, create an environment that supports good, upright posture. Without extra support, muscles fatigue and lead to excessive strain on joints and other tissues. CHAIR:  A chair should be able to slide under your desk when your back makes contact with the back of the chair. This allows you to work closely.  The chair's height should allow your eyes to be level with the upper part of your monitor  and your hands to be slightly lower than your elbows.  Body position:  Your feet should make contact with the floor. If this is not possible, use a foot rest.  Keep your ears over your shoulders. This will reduce stress on your neck and low back.   This information is not intended to replace advice given to you by your health care provider. Make sure you discuss any questions you have with your health care provider.   Document Released: 12/31/2004 Document Revised: 01/21/2014 Document Reviewed: 04/14/2008 Elsevier Interactive Patient Education [employer].

## 2014-12-19 NOTE — Assessment & Plan Note (Signed)
In office UA negative for leukocytes, nitrites and hematuria. Urine sent for culture. Unlikely cystitis with antibiotics if needed pending urine culture. Cannot rule out overactive bladder. Follow up pending urine culture results.

## 2014-12-20 LAB — URINE CULTURE
Colony Count: NO GROWTH
Organism ID, Bacteria: NO GROWTH

## 2014-12-21 ENCOUNTER — Telehealth: Payer: Self-pay | Admitting: Family

## 2014-12-21 NOTE — Telephone Encounter (Signed)
Please inform patient that her urine culture showed no evidence of infection. Therefore if she continues to experience symptoms please let us know.

## 2014-12-23 NOTE — Telephone Encounter (Signed)
Pt aware of results 

## 2014-12-29 ENCOUNTER — Telehealth: Payer: Self-pay | Admitting: Family

## 2014-12-29 NOTE — Telephone Encounter (Signed)
Pt called request to speak to the assistant concern about pain medication, she said its not working. Please advise.

## 2014-12-30 NOTE — Telephone Encounter (Signed)
Called pt know to verify what was going on. She states the pain medication that she is taking, which looks like is specifically for her restless legs, are not working for her back. She said that you told her to take that for her back and its not working

## 2015-01-02 MED ORDER — TIZANIDINE HCL 2 MG PO TABS: 4.0000 mg | ORAL_TABLET | Freq: Three times a day (TID) | ORAL | Status: DC | PRN

## 2015-01-02 NOTE — Telephone Encounter (Signed)
LVM letting pt know.  

## 2015-01-02 NOTE — Telephone Encounter (Signed)
I will send in the Zanaflex for her to take.

## 2015-01-03 ENCOUNTER — Telehealth: Payer: Self-pay | Admitting: Family

## 2015-01-03 MED ORDER — METHOCARBAMOL 500 MG PO TABS: 500.0000 mg | ORAL_TABLET | Freq: Three times a day (TID) | ORAL | Status: DC | PRN

## 2015-01-03 NOTE — Telephone Encounter (Signed)
Robaxin sent to pharmacy. Please have her discontinue taking the Zanaflex

## 2015-01-03 NOTE — Telephone Encounter (Signed)
Patient would like call back in regards to pain medication

## 2015-01-03 NOTE — Telephone Encounter (Signed)
Returned pts call and she stated that she told me the wrong medication. She has already taking the tizanidine and it was not working. Wants something else for her back. Please advise

## 2015-01-04 NOTE — Telephone Encounter (Signed)
Left detailed message letting pt know.

## 2015-01-13 DIAGNOSIS — I10 Essential (primary) hypertension: Secondary | ICD-10-CM | POA: Diagnosis not present

## 2015-01-13 DIAGNOSIS — M546 Pain in thoracic spine: Secondary | ICD-10-CM | POA: Diagnosis not present

## 2015-01-19 DIAGNOSIS — M546 Pain in thoracic spine: Secondary | ICD-10-CM | POA: Diagnosis not present

## 2015-02-01 ENCOUNTER — Ambulatory Visit: Payer: Medicare Other | Admitting: Family

## 2015-02-16 ENCOUNTER — Other Ambulatory Visit: Payer: Self-pay

## 2015-02-24 DIAGNOSIS — R262 Difficulty in walking, not elsewhere classified: Secondary | ICD-10-CM | POA: Diagnosis not present

## 2015-02-24 DIAGNOSIS — M546 Pain in thoracic spine: Secondary | ICD-10-CM | POA: Diagnosis not present

## 2015-02-24 DIAGNOSIS — R293 Abnormal posture: Secondary | ICD-10-CM | POA: Diagnosis not present

## 2015-02-24 DIAGNOSIS — Z7409 Other reduced mobility: Secondary | ICD-10-CM | POA: Diagnosis not present

## 2015-03-01 DIAGNOSIS — M546 Pain in thoracic spine: Secondary | ICD-10-CM | POA: Diagnosis not present

## 2015-03-01 DIAGNOSIS — R262 Difficulty in walking, not elsewhere classified: Secondary | ICD-10-CM | POA: Diagnosis not present

## 2015-03-01 DIAGNOSIS — Z7409 Other reduced mobility: Secondary | ICD-10-CM | POA: Diagnosis not present

## 2015-03-01 DIAGNOSIS — R293 Abnormal posture: Secondary | ICD-10-CM | POA: Diagnosis not present

## 2015-03-06 DIAGNOSIS — M546 Pain in thoracic spine: Secondary | ICD-10-CM | POA: Diagnosis not present

## 2015-03-06 DIAGNOSIS — Z7409 Other reduced mobility: Secondary | ICD-10-CM | POA: Diagnosis not present

## 2015-03-06 DIAGNOSIS — R293 Abnormal posture: Secondary | ICD-10-CM | POA: Diagnosis not present

## 2015-03-06 DIAGNOSIS — R262 Difficulty in walking, not elsewhere classified: Secondary | ICD-10-CM | POA: Diagnosis not present

## 2015-03-08 DIAGNOSIS — M546 Pain in thoracic spine: Secondary | ICD-10-CM | POA: Diagnosis not present

## 2015-03-08 DIAGNOSIS — R293 Abnormal posture: Secondary | ICD-10-CM | POA: Diagnosis not present

## 2015-03-08 DIAGNOSIS — Z7409 Other reduced mobility: Secondary | ICD-10-CM | POA: Diagnosis not present

## 2015-03-08 DIAGNOSIS — R262 Difficulty in walking, not elsewhere classified: Secondary | ICD-10-CM | POA: Diagnosis not present

## 2015-03-13 DIAGNOSIS — R293 Abnormal posture: Secondary | ICD-10-CM | POA: Diagnosis not present

## 2015-03-13 DIAGNOSIS — M546 Pain in thoracic spine: Secondary | ICD-10-CM | POA: Diagnosis not present

## 2015-03-13 DIAGNOSIS — R262 Difficulty in walking, not elsewhere classified: Secondary | ICD-10-CM | POA: Diagnosis not present

## 2015-03-13 DIAGNOSIS — Z7409 Other reduced mobility: Secondary | ICD-10-CM | POA: Diagnosis not present

## 2015-03-15 DIAGNOSIS — R262 Difficulty in walking, not elsewhere classified: Secondary | ICD-10-CM | POA: Diagnosis not present

## 2015-03-15 DIAGNOSIS — M546 Pain in thoracic spine: Secondary | ICD-10-CM | POA: Diagnosis not present

## 2015-03-15 DIAGNOSIS — R293 Abnormal posture: Secondary | ICD-10-CM | POA: Diagnosis not present

## 2015-03-15 DIAGNOSIS — Z7409 Other reduced mobility: Secondary | ICD-10-CM | POA: Diagnosis not present

## 2015-04-04 DIAGNOSIS — Z853 Personal history of malignant neoplasm of breast: Secondary | ICD-10-CM | POA: Diagnosis not present

## 2015-04-07 ENCOUNTER — Other Ambulatory Visit: Payer: Self-pay

## 2015-04-07 DIAGNOSIS — Z1231 Encounter for screening mammogram for malignant neoplasm of breast: Secondary | ICD-10-CM

## 2015-04-07 DIAGNOSIS — Z853 Personal history of malignant neoplasm of breast: Secondary | ICD-10-CM | POA: Diagnosis not present

## 2015-04-07 DIAGNOSIS — N950 Postmenopausal bleeding: Secondary | ICD-10-CM | POA: Diagnosis not present

## 2015-04-07 DIAGNOSIS — Z124 Encounter for screening for malignant neoplasm of cervix: Secondary | ICD-10-CM | POA: Diagnosis not present

## 2015-04-07 DIAGNOSIS — N939 Abnormal uterine and vaginal bleeding, unspecified: Secondary | ICD-10-CM | POA: Diagnosis not present

## 2015-04-07 DIAGNOSIS — N8501 Benign endometrial hyperplasia: Secondary | ICD-10-CM | POA: Diagnosis not present

## 2015-04-07 DIAGNOSIS — Z01419 Encounter for gynecological examination (general) (routine) without abnormal findings: Secondary | ICD-10-CM | POA: Diagnosis not present

## 2015-04-14 DIAGNOSIS — N950 Postmenopausal bleeding: Secondary | ICD-10-CM | POA: Diagnosis not present

## 2015-04-17 DIAGNOSIS — R4189 Other symptoms and signs involving cognitive functions and awareness: Secondary | ICD-10-CM | POA: Diagnosis not present

## 2015-04-17 DIAGNOSIS — I10 Essential (primary) hypertension: Secondary | ICD-10-CM | POA: Diagnosis not present

## 2015-04-17 DIAGNOSIS — G479 Sleep disorder, unspecified: Secondary | ICD-10-CM | POA: Diagnosis not present

## 2015-04-20 DIAGNOSIS — Z1231 Encounter for screening mammogram for malignant neoplasm of breast: Secondary | ICD-10-CM | POA: Diagnosis not present

## 2015-04-20 DIAGNOSIS — N939 Abnormal uterine and vaginal bleeding, unspecified: Secondary | ICD-10-CM | POA: Diagnosis not present

## 2015-04-20 DIAGNOSIS — C50919 Malignant neoplasm of unspecified site of unspecified female breast: Secondary | ICD-10-CM | POA: Diagnosis not present

## 2015-04-20 DIAGNOSIS — Z79810 Long term (current) use of selective estrogen receptor modulators (SERMs): Secondary | ICD-10-CM | POA: Diagnosis not present

## 2015-04-20 DIAGNOSIS — Z853 Personal history of malignant neoplasm of breast: Secondary | ICD-10-CM | POA: Diagnosis not present

## 2015-04-20 DIAGNOSIS — R079 Chest pain, unspecified: Secondary | ICD-10-CM | POA: Diagnosis not present

## 2015-04-20 DIAGNOSIS — Z79899 Other long term (current) drug therapy: Secondary | ICD-10-CM | POA: Diagnosis not present

## 2015-04-24 DIAGNOSIS — Z9841 Cataract extraction status, right eye: Secondary | ICD-10-CM | POA: Diagnosis not present

## 2015-04-24 DIAGNOSIS — N858 Other specified noninflammatory disorders of uterus: Secondary | ICD-10-CM | POA: Diagnosis not present

## 2015-04-24 DIAGNOSIS — N9489 Other specified conditions associated with female genital organs and menstrual cycle: Secondary | ICD-10-CM | POA: Diagnosis not present

## 2015-04-24 DIAGNOSIS — N189 Chronic kidney disease, unspecified: Secondary | ICD-10-CM | POA: Diagnosis not present

## 2015-04-24 DIAGNOSIS — I129 Hypertensive chronic kidney disease with stage 1 through stage 4 chronic kidney disease, or unspecified chronic kidney disease: Secondary | ICD-10-CM | POA: Diagnosis not present

## 2015-04-24 DIAGNOSIS — N950 Postmenopausal bleeding: Secondary | ICD-10-CM | POA: Diagnosis not present

## 2015-04-24 DIAGNOSIS — Z79899 Other long term (current) drug therapy: Secondary | ICD-10-CM | POA: Diagnosis not present

## 2015-04-24 DIAGNOSIS — E079 Disorder of thyroid, unspecified: Secondary | ICD-10-CM | POA: Diagnosis not present

## 2015-04-24 DIAGNOSIS — N840 Polyp of corpus uteri: Secondary | ICD-10-CM | POA: Diagnosis not present

## 2015-04-24 DIAGNOSIS — Z9842 Cataract extraction status, left eye: Secondary | ICD-10-CM | POA: Diagnosis not present

## 2015-04-24 DIAGNOSIS — Z87891 Personal history of nicotine dependence: Secondary | ICD-10-CM | POA: Diagnosis not present

## 2015-05-01 DIAGNOSIS — Z9889 Other specified postprocedural states: Secondary | ICD-10-CM | POA: Diagnosis not present

## 2015-05-01 DIAGNOSIS — N6459 Other signs and symptoms in breast: Secondary | ICD-10-CM | POA: Diagnosis not present

## 2015-05-01 DIAGNOSIS — R911 Solitary pulmonary nodule: Secondary | ICD-10-CM | POA: Diagnosis not present

## 2015-05-01 DIAGNOSIS — Z853 Personal history of malignant neoplasm of breast: Secondary | ICD-10-CM | POA: Diagnosis not present

## 2015-05-11 DIAGNOSIS — Z853 Personal history of malignant neoplasm of breast: Secondary | ICD-10-CM | POA: Diagnosis not present

## 2015-05-11 DIAGNOSIS — M898X9 Other specified disorders of bone, unspecified site: Secondary | ICD-10-CM | POA: Diagnosis not present

## 2015-05-11 DIAGNOSIS — Z1231 Encounter for screening mammogram for malignant neoplasm of breast: Secondary | ICD-10-CM | POA: Diagnosis not present

## 2015-05-17 DIAGNOSIS — R944 Abnormal results of kidney function studies: Secondary | ICD-10-CM | POA: Diagnosis not present

## 2015-05-25 DIAGNOSIS — Z9889 Other specified postprocedural states: Secondary | ICD-10-CM | POA: Diagnosis not present

## 2015-05-25 DIAGNOSIS — Z853 Personal history of malignant neoplasm of breast: Secondary | ICD-10-CM | POA: Diagnosis not present

## 2015-06-01 DIAGNOSIS — M5414 Radiculopathy, thoracic region: Secondary | ICD-10-CM | POA: Diagnosis not present

## 2015-06-01 DIAGNOSIS — M9903 Segmental and somatic dysfunction of lumbar region: Secondary | ICD-10-CM | POA: Diagnosis not present

## 2015-06-01 DIAGNOSIS — M5386 Other specified dorsopathies, lumbar region: Secondary | ICD-10-CM | POA: Diagnosis not present

## 2015-06-01 DIAGNOSIS — M9902 Segmental and somatic dysfunction of thoracic region: Secondary | ICD-10-CM | POA: Diagnosis not present

## 2015-06-01 DIAGNOSIS — M9901 Segmental and somatic dysfunction of cervical region: Secondary | ICD-10-CM | POA: Diagnosis not present

## 2015-06-01 DIAGNOSIS — M531 Cervicobrachial syndrome: Secondary | ICD-10-CM | POA: Diagnosis not present

## 2015-06-05 DIAGNOSIS — M9903 Segmental and somatic dysfunction of lumbar region: Secondary | ICD-10-CM | POA: Diagnosis not present

## 2015-06-05 DIAGNOSIS — M531 Cervicobrachial syndrome: Secondary | ICD-10-CM | POA: Diagnosis not present

## 2015-06-05 DIAGNOSIS — M9902 Segmental and somatic dysfunction of thoracic region: Secondary | ICD-10-CM | POA: Diagnosis not present

## 2015-06-05 DIAGNOSIS — M5386 Other specified dorsopathies, lumbar region: Secondary | ICD-10-CM | POA: Diagnosis not present

## 2015-06-05 DIAGNOSIS — M5414 Radiculopathy, thoracic region: Secondary | ICD-10-CM | POA: Diagnosis not present

## 2015-06-05 DIAGNOSIS — M9901 Segmental and somatic dysfunction of cervical region: Secondary | ICD-10-CM | POA: Diagnosis not present

## 2015-06-06 ENCOUNTER — Ambulatory Visit: Payer: Medicare Other | Admitting: Family Medicine

## 2015-06-14 DIAGNOSIS — M5386 Other specified dorsopathies, lumbar region: Secondary | ICD-10-CM | POA: Diagnosis not present

## 2015-06-14 DIAGNOSIS — M9902 Segmental and somatic dysfunction of thoracic region: Secondary | ICD-10-CM | POA: Diagnosis not present

## 2015-06-14 DIAGNOSIS — M531 Cervicobrachial syndrome: Secondary | ICD-10-CM | POA: Diagnosis not present

## 2015-06-14 DIAGNOSIS — M9903 Segmental and somatic dysfunction of lumbar region: Secondary | ICD-10-CM | POA: Diagnosis not present

## 2015-06-14 DIAGNOSIS — M5414 Radiculopathy, thoracic region: Secondary | ICD-10-CM | POA: Diagnosis not present

## 2015-06-14 DIAGNOSIS — M9901 Segmental and somatic dysfunction of cervical region: Secondary | ICD-10-CM | POA: Diagnosis not present

## 2015-06-16 DIAGNOSIS — M5414 Radiculopathy, thoracic region: Secondary | ICD-10-CM | POA: Diagnosis not present

## 2015-06-16 DIAGNOSIS — M9901 Segmental and somatic dysfunction of cervical region: Secondary | ICD-10-CM | POA: Diagnosis not present

## 2015-06-16 DIAGNOSIS — M9902 Segmental and somatic dysfunction of thoracic region: Secondary | ICD-10-CM | POA: Diagnosis not present

## 2015-06-16 DIAGNOSIS — M9903 Segmental and somatic dysfunction of lumbar region: Secondary | ICD-10-CM | POA: Diagnosis not present

## 2015-06-16 DIAGNOSIS — M531 Cervicobrachial syndrome: Secondary | ICD-10-CM | POA: Diagnosis not present

## 2015-06-16 DIAGNOSIS — M5386 Other specified dorsopathies, lumbar region: Secondary | ICD-10-CM | POA: Diagnosis not present

## 2015-06-19 DIAGNOSIS — M9903 Segmental and somatic dysfunction of lumbar region: Secondary | ICD-10-CM | POA: Diagnosis not present

## 2015-06-19 DIAGNOSIS — M531 Cervicobrachial syndrome: Secondary | ICD-10-CM | POA: Diagnosis not present

## 2015-06-19 DIAGNOSIS — M5386 Other specified dorsopathies, lumbar region: Secondary | ICD-10-CM | POA: Diagnosis not present

## 2015-06-19 DIAGNOSIS — M5414 Radiculopathy, thoracic region: Secondary | ICD-10-CM | POA: Diagnosis not present

## 2015-06-19 DIAGNOSIS — M9901 Segmental and somatic dysfunction of cervical region: Secondary | ICD-10-CM | POA: Diagnosis not present

## 2015-06-19 DIAGNOSIS — M9902 Segmental and somatic dysfunction of thoracic region: Secondary | ICD-10-CM | POA: Diagnosis not present

## 2015-06-21 DIAGNOSIS — M9902 Segmental and somatic dysfunction of thoracic region: Secondary | ICD-10-CM | POA: Diagnosis not present

## 2015-06-21 DIAGNOSIS — M9903 Segmental and somatic dysfunction of lumbar region: Secondary | ICD-10-CM | POA: Diagnosis not present

## 2015-06-21 DIAGNOSIS — M5414 Radiculopathy, thoracic region: Secondary | ICD-10-CM | POA: Diagnosis not present

## 2015-06-21 DIAGNOSIS — M531 Cervicobrachial syndrome: Secondary | ICD-10-CM | POA: Diagnosis not present

## 2015-06-21 DIAGNOSIS — M9901 Segmental and somatic dysfunction of cervical region: Secondary | ICD-10-CM | POA: Diagnosis not present

## 2015-06-21 DIAGNOSIS — M5386 Other specified dorsopathies, lumbar region: Secondary | ICD-10-CM | POA: Diagnosis not present

## 2015-06-23 ENCOUNTER — Ambulatory Visit: Payer: Medicare Other | Admitting: Family Medicine

## 2015-06-23 DIAGNOSIS — M5414 Radiculopathy, thoracic region: Secondary | ICD-10-CM | POA: Diagnosis not present

## 2015-06-23 DIAGNOSIS — M9903 Segmental and somatic dysfunction of lumbar region: Secondary | ICD-10-CM | POA: Diagnosis not present

## 2015-06-23 DIAGNOSIS — M9902 Segmental and somatic dysfunction of thoracic region: Secondary | ICD-10-CM | POA: Diagnosis not present

## 2015-06-23 DIAGNOSIS — M5386 Other specified dorsopathies, lumbar region: Secondary | ICD-10-CM | POA: Diagnosis not present

## 2015-06-23 DIAGNOSIS — M531 Cervicobrachial syndrome: Secondary | ICD-10-CM | POA: Diagnosis not present

## 2015-06-23 DIAGNOSIS — M9901 Segmental and somatic dysfunction of cervical region: Secondary | ICD-10-CM | POA: Diagnosis not present

## 2015-06-26 DIAGNOSIS — M9901 Segmental and somatic dysfunction of cervical region: Secondary | ICD-10-CM | POA: Diagnosis not present

## 2015-06-26 DIAGNOSIS — M5414 Radiculopathy, thoracic region: Secondary | ICD-10-CM | POA: Diagnosis not present

## 2015-06-26 DIAGNOSIS — M9902 Segmental and somatic dysfunction of thoracic region: Secondary | ICD-10-CM | POA: Diagnosis not present

## 2015-06-26 DIAGNOSIS — M5386 Other specified dorsopathies, lumbar region: Secondary | ICD-10-CM | POA: Diagnosis not present

## 2015-06-26 DIAGNOSIS — M9903 Segmental and somatic dysfunction of lumbar region: Secondary | ICD-10-CM | POA: Diagnosis not present

## 2015-06-26 DIAGNOSIS — M531 Cervicobrachial syndrome: Secondary | ICD-10-CM | POA: Diagnosis not present

## 2015-06-28 DIAGNOSIS — M9901 Segmental and somatic dysfunction of cervical region: Secondary | ICD-10-CM | POA: Diagnosis not present

## 2015-06-28 DIAGNOSIS — M9903 Segmental and somatic dysfunction of lumbar region: Secondary | ICD-10-CM | POA: Diagnosis not present

## 2015-06-28 DIAGNOSIS — M5386 Other specified dorsopathies, lumbar region: Secondary | ICD-10-CM | POA: Diagnosis not present

## 2015-06-28 DIAGNOSIS — M9902 Segmental and somatic dysfunction of thoracic region: Secondary | ICD-10-CM | POA: Diagnosis not present

## 2015-06-28 DIAGNOSIS — M531 Cervicobrachial syndrome: Secondary | ICD-10-CM | POA: Diagnosis not present

## 2015-06-28 DIAGNOSIS — M5414 Radiculopathy, thoracic region: Secondary | ICD-10-CM | POA: Diagnosis not present

## 2015-06-30 DIAGNOSIS — M9902 Segmental and somatic dysfunction of thoracic region: Secondary | ICD-10-CM | POA: Diagnosis not present

## 2015-06-30 DIAGNOSIS — M9903 Segmental and somatic dysfunction of lumbar region: Secondary | ICD-10-CM | POA: Diagnosis not present

## 2015-06-30 DIAGNOSIS — M531 Cervicobrachial syndrome: Secondary | ICD-10-CM | POA: Diagnosis not present

## 2015-06-30 DIAGNOSIS — M9901 Segmental and somatic dysfunction of cervical region: Secondary | ICD-10-CM | POA: Diagnosis not present

## 2015-06-30 DIAGNOSIS — M5386 Other specified dorsopathies, lumbar region: Secondary | ICD-10-CM | POA: Diagnosis not present

## 2015-06-30 DIAGNOSIS — M5414 Radiculopathy, thoracic region: Secondary | ICD-10-CM | POA: Diagnosis not present

## 2015-07-03 DIAGNOSIS — M9901 Segmental and somatic dysfunction of cervical region: Secondary | ICD-10-CM | POA: Diagnosis not present

## 2015-07-03 DIAGNOSIS — M5414 Radiculopathy, thoracic region: Secondary | ICD-10-CM | POA: Diagnosis not present

## 2015-07-03 DIAGNOSIS — M5386 Other specified dorsopathies, lumbar region: Secondary | ICD-10-CM | POA: Diagnosis not present

## 2015-07-03 DIAGNOSIS — M9902 Segmental and somatic dysfunction of thoracic region: Secondary | ICD-10-CM | POA: Diagnosis not present

## 2015-07-03 DIAGNOSIS — M531 Cervicobrachial syndrome: Secondary | ICD-10-CM | POA: Diagnosis not present

## 2015-07-03 DIAGNOSIS — M9903 Segmental and somatic dysfunction of lumbar region: Secondary | ICD-10-CM | POA: Diagnosis not present

## 2015-07-06 DIAGNOSIS — M5414 Radiculopathy, thoracic region: Secondary | ICD-10-CM | POA: Diagnosis not present

## 2015-07-06 DIAGNOSIS — M9903 Segmental and somatic dysfunction of lumbar region: Secondary | ICD-10-CM | POA: Diagnosis not present

## 2015-07-06 DIAGNOSIS — M531 Cervicobrachial syndrome: Secondary | ICD-10-CM | POA: Diagnosis not present

## 2015-07-06 DIAGNOSIS — M9901 Segmental and somatic dysfunction of cervical region: Secondary | ICD-10-CM | POA: Diagnosis not present

## 2015-07-06 DIAGNOSIS — M9902 Segmental and somatic dysfunction of thoracic region: Secondary | ICD-10-CM | POA: Diagnosis not present

## 2015-07-06 DIAGNOSIS — M5386 Other specified dorsopathies, lumbar region: Secondary | ICD-10-CM | POA: Diagnosis not present

## 2015-07-10 DIAGNOSIS — M531 Cervicobrachial syndrome: Secondary | ICD-10-CM | POA: Diagnosis not present

## 2015-07-10 DIAGNOSIS — M5414 Radiculopathy, thoracic region: Secondary | ICD-10-CM | POA: Diagnosis not present

## 2015-07-10 DIAGNOSIS — M9902 Segmental and somatic dysfunction of thoracic region: Secondary | ICD-10-CM | POA: Diagnosis not present

## 2015-07-10 DIAGNOSIS — M9901 Segmental and somatic dysfunction of cervical region: Secondary | ICD-10-CM | POA: Diagnosis not present

## 2015-07-10 DIAGNOSIS — M9903 Segmental and somatic dysfunction of lumbar region: Secondary | ICD-10-CM | POA: Diagnosis not present

## 2015-07-10 DIAGNOSIS — M5386 Other specified dorsopathies, lumbar region: Secondary | ICD-10-CM | POA: Diagnosis not present

## 2015-08-10 ENCOUNTER — Encounter: Payer: Self-pay | Admitting: Nurse Practitioner

## 2015-08-10 ENCOUNTER — Ambulatory Visit (INDEPENDENT_AMBULATORY_CARE_PROVIDER_SITE_OTHER): Payer: Medicare Other | Admitting: Nurse Practitioner

## 2015-08-10 VITALS — BP 132/68 | HR 64 | Temp 97.7°F | Resp 17 | Ht 62.5 in | Wt 194.6 lb

## 2015-08-10 DIAGNOSIS — F0390 Unspecified dementia without behavioral disturbance: Secondary | ICD-10-CM

## 2015-08-10 DIAGNOSIS — Z853 Personal history of malignant neoplasm of breast: Secondary | ICD-10-CM | POA: Insufficient documentation

## 2015-08-10 DIAGNOSIS — I10 Essential (primary) hypertension: Secondary | ICD-10-CM | POA: Diagnosis not present

## 2015-08-10 DIAGNOSIS — E034 Atrophy of thyroid (acquired): Secondary | ICD-10-CM

## 2015-08-10 DIAGNOSIS — M5136 Other intervertebral disc degeneration, lumbar region: Secondary | ICD-10-CM

## 2015-08-10 DIAGNOSIS — C50912 Malignant neoplasm of unspecified site of left female breast: Secondary | ICD-10-CM

## 2015-08-10 DIAGNOSIS — E038 Other specified hypothyroidism: Secondary | ICD-10-CM | POA: Diagnosis not present

## 2015-08-10 DIAGNOSIS — E039 Hypothyroidism, unspecified: Secondary | ICD-10-CM | POA: Insufficient documentation

## 2015-08-10 DIAGNOSIS — R12 Heartburn: Secondary | ICD-10-CM | POA: Diagnosis not present

## 2015-08-10 DIAGNOSIS — C50919 Malignant neoplasm of unspecified site of unspecified female breast: Secondary | ICD-10-CM | POA: Insufficient documentation

## 2015-08-10 DIAGNOSIS — F329 Major depressive disorder, single episode, unspecified: Secondary | ICD-10-CM | POA: Diagnosis not present

## 2015-08-10 DIAGNOSIS — F32A Depression, unspecified: Secondary | ICD-10-CM | POA: Insufficient documentation

## 2015-08-10 LAB — CBC WITH DIFFERENTIAL/PLATELET
Basophils Absolute: 0 cells/uL (ref 0–200)
Basophils Relative: 0 %
Eosinophils Absolute: 224 cells/uL (ref 15–500)
Eosinophils Relative: 4 %
HCT: 32.1 % — ABNORMAL LOW (ref 35.0–45.0)
Hemoglobin: 10.5 g/dL — ABNORMAL LOW (ref 11.7–15.5)
Lymphocytes Relative: 38 %
Lymphs Abs: 2128 cells/uL (ref 850–3900)
MCH: 29.7 pg (ref 27.0–33.0)
MCHC: 32.7 g/dL (ref 32.0–36.0)
MCV: 90.7 fL (ref 80.0–100.0)
MPV: 10.1 fL (ref 7.5–12.5)
Monocytes Absolute: 560 cells/uL (ref 200–950)
Monocytes Relative: 10 %
Neutro Abs: 2688 cells/uL (ref 1500–7800)
Neutrophils Relative %: 48 %
Platelets: 255 10*3/uL (ref 140–400)
RBC: 3.54 MIL/uL — ABNORMAL LOW (ref 3.80–5.10)
RDW: 14.5 % (ref 11.0–15.0)
WBC: 5.6 10*3/uL (ref 3.8–10.8)

## 2015-08-10 LAB — COMPLETE METABOLIC PANEL WITH GFR
ALT: 14 U/L (ref 6–29)
AST: 21 U/L (ref 10–35)
Albumin: 3.7 g/dL (ref 3.6–5.1)
Alkaline Phosphatase: 49 U/L (ref 33–130)
BUN: 26 mg/dL — ABNORMAL HIGH (ref 7–25)
CO2: 24 mmol/L (ref 20–31)
Calcium: 9.1 mg/dL (ref 8.6–10.4)
Chloride: 104 mmol/L (ref 98–110)
Creat: 1.86 mg/dL — ABNORMAL HIGH (ref 0.60–0.93)
GFR, Est African American: 29 mL/min — ABNORMAL LOW (ref >=60)
GFR, Est Non African American: 25 mL/min — ABNORMAL LOW (ref >=60)
Glucose, Bld: 97 mg/dL (ref 65–99)
Potassium: 4.4 mmol/L (ref 3.5–5.3)
Sodium: 138 mmol/L (ref 135–146)
Total Bilirubin: 0.2 mg/dL (ref 0.2–1.2)
Total Protein: 7.2 g/dL (ref 6.1–8.1)

## 2015-08-10 LAB — TSH: TSH: 0.83 mIU/L

## 2015-08-10 LAB — LIPID PANEL
Cholesterol: 148 mg/dL (ref 125–200)
HDL: 42 mg/dL — ABNORMAL LOW (ref >=46)
LDL Cholesterol: 81 mg/dL (ref <130)
Total CHOL/HDL Ratio: 3.5 Ratio (ref <=5.0)
Triglycerides: 125 mg/dL (ref <150)
VLDL: 25 mg/dL (ref <30)

## 2015-08-10 NOTE — Progress Notes (Signed)
PCP: Dorena Dew, FNP  Advanced Directive information Does patient have an advance directive?: No, Would patient like information on creating an advanced directive?: No - patient declined information  Allergies  Allergen Reactions  . Neomy-Bacit-Polymyx-Pramoxine     Not sure which antibiotic she has a reaction to    Chief Complaint  Patient presents with  . Medical Management of Chronic Issues    Establish as a new patient.      HPI: Patient is a 81 y.o. female seen in the office today to establish care.  Pt with hx of dementia, breast cancer following with oncology, htn, hypothyroidism, GERD, Depression and anxiety, insomnia and others  Previously at Dr Luciana Axe looking for another PCP Unsure of what she is taking, just looks at her list, does her own medication per pt however in previous note from PCP daughters are now helping her with this.  Has had a procedure that was like a hysterectomy but not one. (will await records) seeing Dr Lucita Lora GYN and Dr Bache Skene  Pt does have hx of breast cancer and noted vaginal bleeding.  Pt is from [place] so she is not sure of where a lot of her providers are located. Currently living in [place] at this time with her daughter.    Reports her only real issue is her back- been to chiropractors and PT nothing has helped. xrays done in January 2017 which shows degenerative changes.  Feels like it is getting worse. Not taking medication for this  Previous pain medication helped but she said she did not want to get addicted to them.    Review of Systems:  Review of Systems  Constitutional: Negative for activity change, appetite change, fatigue and unexpected weight change.  HENT: Positive for tinnitus. Negative for congestion and hearing loss.        Long standing hx of tinnitus   Eyes: Negative.   Respiratory: Negative for cough and shortness of breath.   Cardiovascular: Negative for chest pain, palpitations and leg swelling.   Gastrointestinal: Negative for abdominal pain, constipation and diarrhea.  Genitourinary: Negative for difficulty urinating and dysuria.  Musculoskeletal: Negative for arthralgias and myalgias.  Skin: Negative for color change and wound.  Allergic/Immunologic: Positive for environmental allergies.  Neurological: Negative for dizziness and weakness.  Psychiatric/Behavioral: Positive for confusion and sleep disturbance. Negative for agitation and behavioral problems. The patient is nervous/anxious.        Anxiety and depression controlled on medication    Past Medical History:  Diagnosis Date  . Breast cancer ([place])   . Dementia   . Depression   . Heartburn   . Hypertension   . Leg cramps   . Thyroid disease    Past Surgical History:  Procedure Laterality Date  . hysteroscopy biopsy     Social History:   reports that she has quit smoking. She has a 2.00 pack-year smoking history. She has never used smokeless tobacco. She reports that she does not drink alcohol or use drugs.  Family History  Problem Relation Age of Onset  . Hypertension Mother   . Arthritis Mother   . Hypertension Father   . Cancer Brother     Medications: Patient's Medications  New Prescriptions   No medications on file  Previous Medications   AMLODIPINE (NORVASC) 5 MG TABLET    Take 5 mg by mouth daily.   BUSPIRONE (BUSPAR) 7.5 MG TABLET    Take by mouth 2 (two) times daily.  COD LIVER OIL CAPS    Take 1 capsule by mouth 2 (two) times daily.   DONEPEZIL (ARICEPT) 5 MG TABLET    Take 5 mg by mouth at bedtime.   FLUOXETINE (PROZAC) 20 MG CAPSULE    Take 20 mg by mouth daily.   GARLIC 10 MG CAPS    Take 1 capsule by mouth 2 (two) times daily.   HYDROCHLOROTHIAZIDE (HYDRODIURIL) 25 MG TABLET    Take 25 mg by mouth daily.    LEVOTHYROXINE (SYNTHROID, LEVOTHROID) 100 MCG TABLET    Take 100 mcg by mouth daily before breakfast.   LOSARTAN (COZAAR) 50 MG TABLET    Take 50 mg by mouth daily.   MAGNESIUM ([employer]  TRIPLE MAGNESIUM COMPLEX) 400 MG CAPS    Take by mouth.   METOPROLOL SUCCINATE (TOPROL-XL) 50 MG 24 HR TABLET    Take 50 mg by mouth daily. Take with or immediately following a meal.   OMEPRAZOLE (PRILOSEC) 20 MG CAPSULE    Take 20 mg by mouth daily.   ROPINIROLE (REQUIP) 1 MG TABLET    Take 1 mg by mouth at bedtime.    TAMOXIFEN (NOLVADEX) 20 MG TABLET    Take 20 mg by mouth daily.   VITAMIN A (CVS VITAMIN A) 10000 UNIT CAPSULE    Take by mouth.  Modified Medications   No medications on file  Discontinued Medications   AMLODIPINE (NORVASC) 10 MG TABLET    Take 10 mg by mouth daily.   COD LIVER OIL 5000-500 UNIT/5ML OIL    Take by mouth.   GARLIC 10 MG CAPS    Take by mouth.   TIZANIDINE (ZANAFLEX) 4 MG CAPSULE    Take 4 mg by mouth at bedtime.    TRAZODONE (DESYREL) 50 MG TABLET    Take 50 mg by mouth at bedtime.     Physical Exam:  Vitals:   08/10/15 0928  BP: 132/68  Pulse: 64  Resp: 17  Temp: 97.7 F (36.5 C)  TempSrc: Oral  SpO2: 97%  Weight: 194 lb 9.6 oz (88.3 kg)  Height: 5' 2.5" (1.588 m)   Body mass index is 35.03 kg/m.  Physical Exam  Constitutional: She is oriented to person, place, and time. She appears well-developed and well-nourished. No distress.  HENT:  Head: Normocephalic and atraumatic.  Mouth/Throat: Oropharynx is clear and moist.  Eyes: Conjunctivae are normal. Pupils are equal, round, and reactive to light.  Neck: Normal range of motion. Neck supple.  Cardiovascular: Normal rate, regular rhythm and normal heart sounds.   Pulmonary/Chest: Effort normal and breath sounds normal.  Abdominal: Soft. Bowel sounds are normal.  Musculoskeletal: She exhibits no edema or tenderness.  Uses a cane due to balance and lower back pain  Neurological: She is alert and oriented to person, place, and time.  Skin: Skin is warm and dry. She is not diaphoretic.  Psychiatric: She has a normal mood and affect.  Memory loss noted    Labs reviewed: Basic Metabolic  Panel: No results for input(s): NA, K, CL, CO2, GLUCOSE, BUN, CREATININE, CALCIUM, MG, PHOS, TSH in the last 8760 hours. Liver Function Tests: No results for input(s): AST, ALT, ALKPHOS, BILITOT, PROT, ALBUMIN in the last 8760 hours. No results for input(s): LIPASE, AMYLASE in the last 8760 hours. No results for input(s): AMMONIA in the last 8760 hours. CBC: No results for input(s): WBC, NEUTROABS, HGB, HCT, MCV, PLT in the last 8760 hours. Lipid Panel: No results for input(s): CHOL, HDL,  LDLCALC, TRIG, CHOLHDL, LDLDIRECT in the last 8760 hours. TSH: No results for input(s): TSH in the last 8760 hours. A1C: No results found for: HGBA1C   Assessment/Plan 1. Hypothyroidism due to acquired atrophy of thyroid -conts on synthroid 100 mcg daily, will follow up TSH at this time - TSH  2. Essential hypertension -controlled on todays reading, will cont current medications and monitor  - COMPLETE METABOLIC PANEL WITH GFR - CBC with Differential/Platelet - Lipid panel  3. Heartburn Controlled on prilosec, to cont medication as well as diet modifications   4. Depression In remission, reports good control on prozac  5. Dementia, without behavioral disturbance Will get MMSE at next visit, memory loss apparent -conts on aricept 5 mg qhs, will increase to 10 mg at next visit   6. Malignant neoplasm of left female breast, unspecified site of breast ([employer]) Continues to follow up with oncology and gyn  7. Degenerative disc disease -may use tylenol 1000 mg q 8 hours as needed for pain  Will get blood work today, follow up in 4 weeks for EV with MMSE   Jessica K. Harle Battiest  [facility] [phone] 8 am - 5 pm) [phone] (after hours)

## 2015-08-10 NOTE — Patient Instructions (Addendum)
Try tylenol 1000 mg every 8 hours as needed for back pain  Will get blood work today  To follow up in 4 weeks for a complete physical

## 2015-08-16 ENCOUNTER — Other Ambulatory Visit: Payer: Self-pay | Admitting: *Deleted

## 2015-08-16 MED ORDER — HYDROCHLOROTHIAZIDE 25 MG PO TABS: 25.0000 mg | ORAL_TABLET | Freq: Every day | ORAL | 1 refills | Status: DC

## 2015-08-16 MED ORDER — OMEPRAZOLE 20 MG PO CPDR: 20.0000 mg | DELAYED_RELEASE_CAPSULE | Freq: Every day | ORAL | 1 refills | Status: DC

## 2015-08-16 MED ORDER — FLUOXETINE HCL 20 MG PO CAPS: 20.0000 mg | ORAL_CAPSULE | Freq: Every day | ORAL | 1 refills | Status: DC

## 2015-08-16 NOTE — Telephone Encounter (Signed)
Patient requested 90 day supply of medication be sent to pharmacy.

## 2015-09-11 ENCOUNTER — Ambulatory Visit (INDEPENDENT_AMBULATORY_CARE_PROVIDER_SITE_OTHER): Payer: Medicare Other | Admitting: Nurse Practitioner

## 2015-09-11 ENCOUNTER — Encounter: Payer: Self-pay | Admitting: Nurse Practitioner

## 2015-09-11 VITALS — BP 128/70 | HR 61 | Temp 98.0°F | Resp 12 | Ht 62.5 in | Wt 192.6 lb

## 2015-09-11 DIAGNOSIS — E034 Atrophy of thyroid (acquired): Secondary | ICD-10-CM

## 2015-09-11 DIAGNOSIS — E038 Other specified hypothyroidism: Secondary | ICD-10-CM | POA: Diagnosis not present

## 2015-09-11 DIAGNOSIS — N184 Chronic kidney disease, stage 4 (severe): Secondary | ICD-10-CM

## 2015-09-11 DIAGNOSIS — K219 Gastro-esophageal reflux disease without esophagitis: Secondary | ICD-10-CM

## 2015-09-11 DIAGNOSIS — Z0100 Encounter for examination of eyes and vision without abnormal findings: Secondary | ICD-10-CM

## 2015-09-11 DIAGNOSIS — F0390 Unspecified dementia without behavioral disturbance: Secondary | ICD-10-CM

## 2015-09-11 DIAGNOSIS — Z23 Encounter for immunization: Secondary | ICD-10-CM | POA: Diagnosis not present

## 2015-09-11 DIAGNOSIS — I10 Essential (primary) hypertension: Secondary | ICD-10-CM

## 2015-09-11 MED ORDER — OMEPRAZOLE 20 MG PO CPDR: 20.0000 mg | DELAYED_RELEASE_CAPSULE | Freq: Every day | ORAL | 1 refills | Status: DC

## 2015-09-11 MED ORDER — DONEPEZIL HCL 10 MG PO TABS: 10.0000 mg | ORAL_TABLET | Freq: Every day | ORAL | 3 refills | Status: DC

## 2015-09-11 MED ORDER — METOPROLOL SUCCINATE ER 50 MG PO TB24: 50.0000 mg | ORAL_TABLET | Freq: Every day | ORAL | 1 refills | Status: DC

## 2015-09-11 NOTE — Patient Instructions (Addendum)
To increase Aricept to 10 mg daily for memory  STOP hydrochlorothiazide due to elevated BUN/Cr (kidney) Bring blood pressure machine with you to next OV If possible take blood pressure at home (after you have taken medication)  and bring readings with you to next office visit   Remember you need to eat 3 healthy meals a day Try to have nutritious snacks between meals Ensure between meals daily

## 2015-09-11 NOTE — Progress Notes (Signed)
Careteam: Patient Care Team: [name], NP as PCP - General (Geriatric Medicine) Dustin Folks, MD as Consulting Physician (Hematology and Oncology) Joellyn Haff, MD as Consulting Physician (Obstetrics and Gynecology)  Advanced Directive information Does patient have an advance directive?: No, Would patient like information on creating an advanced directive?: No - patient declined information  Allergies  Allergen Reactions  . Neomy-Bacit-Polymyx-Pramoxine     Not sure which antibiotic she has a reaction to    Chief Complaint  Patient presents with  . Medical Management of Chronic Issues    1 month follow-up, MMSE 27/30- failed clock drawing. Last Annual 10/07/2014  . Referral    Eye doctor referral- prefers [employer] area  . Immunizations    Will wait on records from previous PCP     HPI: Patient is a 81 y.o. female seen in the office today for routine follow up. Physical not due until the end of September.  MMSE - Mini Mental State Exam 09/11/2015  Orientation to time 4  Orientation to Place 4  Registration 3  Attention/ Calculation 5  Recall 2  Language- name 2 objects 2  Language- repeat 1  Language- follow 3 step command 3  Language- read & follow direction 1  Write a sentence 1  Copy design 1  Total score 27  having a hard time remembering county, even though she has been told several times. Has lived here 1 year after a move from [place].   Increase acid reflux since she has been out of her medication.   Blood pressure - stable in office, does not take blood pressure at home BUN and Cr elevated on recent labs Reports she drinks a 4 glasses of water a day Does not take nsaids  Takes tylenol as needed for back pain, works well.  Back pain when she is up moving around.  Has had imagining done of back at old PCP- do not have records yet.   Think she has had her pneumococcal vaccine  Depression- in remission on prozac  Review  of Systems:  Review of Systems  Constitutional: Negative for activity change, appetite change, fatigue and unexpected weight change.  HENT: Positive for tinnitus. Negative for congestion and hearing loss.        Long standing hx of tinnitus   Eyes: Negative.   Respiratory: Negative for cough and shortness of breath.   Cardiovascular: Negative for chest pain, palpitations and leg swelling.  Gastrointestinal: Negative for abdominal pain, constipation and diarrhea.       GERD   Genitourinary: Negative for difficulty urinating and dysuria.  Musculoskeletal: Negative for arthralgias and myalgias.  Skin: Negative for color change and wound.  Allergic/Immunologic: Positive for environmental allergies.  Neurological: Negative for dizziness and weakness.  Psychiatric/Behavioral: Positive for confusion and sleep disturbance. Negative for agitation and behavioral problems. The patient is nervous/anxious.        Anxiety and depression controlled on medication    Past Medical History:  Diagnosis Date  . Breast cancer ([place])   . Dementia   . Depression   . Heartburn   . Hypertension   . Hypothyroidism   . Leg cramps    Past Surgical History:  Procedure Laterality Date  . hysteroscopy biopsy     Social History:   reports that she has quit smoking. She has a 2.00 pack-year smoking history. She has never used smokeless tobacco. She reports that she does not drink alcohol or use drugs.  Family History  Problem Relation Age of Onset  . Hypertension Mother   . Arthritis Mother   . Hypertension Father   . Cancer Brother     Medications: Patient's Medications  New Prescriptions   No medications on file  Previous Medications   AMLODIPINE (NORVASC) 5 MG TABLET    Take 5 mg by mouth daily.   BUSPIRONE (BUSPAR) 7.5 MG TABLET    Take by mouth 2 (two) times daily.    COD LIVER OIL CAPS    Take 1 capsule by mouth 2 (two) times daily.   DONEPEZIL (ARICEPT) 5 MG TABLET    Take 5 mg by mouth at  bedtime.   FLUOXETINE (PROZAC) 20 MG CAPSULE    Take 1 capsule (20 mg total) by mouth daily.   GARLIC 10 MG CAPS    Take 1 capsule by mouth 2 (two) times daily.   HYDROCHLOROTHIAZIDE (HYDRODIURIL) 25 MG TABLET    Take 1 tablet (25 mg total) by mouth daily.   LEVOTHYROXINE (SYNTHROID, LEVOTHROID) 100 MCG TABLET    Take 100 mcg by mouth daily before breakfast.   LOSARTAN (COZAAR) 50 MG TABLET    Take 50 mg by mouth daily.   MAGNESIUM ([employer]S TRIPLE MAGNESIUM COMPLEX) 400 MG CAPS    Take by mouth.   METOPROLOL SUCCINATE (TOPROL-XL) 50 MG 24 HR TABLET    Take 50 mg by mouth daily. Take with or immediately following a meal.   OMEPRAZOLE (PRILOSEC) 20 MG CAPSULE    Take 1 capsule (20 mg total) by mouth daily.   ROPINIROLE (REQUIP) 1 MG TABLET    Take 1 mg by mouth at bedtime.    TAMOXIFEN (NOLVADEX) 20 MG TABLET    Take 20 mg by mouth daily.   TRAZODONE (DESYREL) 50 MG TABLET    Take 50 mg by mouth at bedtime.   VITAMIN A (CVS VITAMIN A) 10000 UNIT CAPSULE    Take by mouth.  Modified Medications   No medications on file  Discontinued Medications   No medications on file     Physical Exam:  Vitals:   09/11/15 1352  BP: 128/70  Pulse: 61  Resp: 12  Temp: 98 F (36.7 C)  TempSrc: Oral  Weight: 192 lb 9.6 oz (87.4 kg)  Height: 5' 2.5" (1.588 m)   Body mass index is 34.67 kg/m.  Physical Exam  Constitutional: She is oriented to person, place, and time. She appears well-developed and well-nourished. No distress.  HENT:  Head: Normocephalic and atraumatic.  Mouth/Throat: Oropharynx is clear and moist.  Eyes: Conjunctivae are normal. Pupils are equal, round, and reactive to light.  Neck: Normal range of motion. Neck supple.  Cardiovascular: Normal rate, regular rhythm and normal heart sounds.   Pulmonary/Chest: Effort normal and breath sounds normal.  Abdominal: Soft. Bowel sounds are normal.  Musculoskeletal: She exhibits no edema or tenderness.  Uses a cane due to balance and lower  back pain  Neurological: She is alert and oriented to person, place, and time.  Skin: Skin is warm and dry. She is not diaphoretic.  Psychiatric: She has a normal mood and affect.  Memory loss noted    Labs reviewed: Basic Metabolic Panel:  Recent Labs  08/10/15 1013  NA 138  K 4.4  CL 104  CO2 24  GLUCOSE 97  BUN 26*  CREATININE 1.86*  CALCIUM 9.1  TSH 0.83   Liver Function Tests:  Recent Labs  08/10/15 1013  AST 21  ALT 14  ALKPHOS 49  BILITOT 0.2  PROT 7.2  ALBUMIN 3.7   No results for input(s): LIPASE, AMYLASE in the last 8760 hours. No results for input(s): AMMONIA in the last 8760 hours. CBC:  Recent Labs  08/10/15 1013  WBC 5.6  NEUTROABS 2,688  HGB 10.5*  HCT 32.1*  MCV 90.7  PLT 255   Lipid Panel:  Recent Labs  08/10/15 1013  CHOL 148  HDL 42*  LDLCALC 81  TRIG 125  CHOLHDL 3.5   TSH:  Recent Labs  08/10/15 1013  TSH 0.83   A1C: No results found for: HGBA1C   Assessment/Plan 1. Essential hypertension -stable at this time, however stopping HCTZ due to elevated BUN and CR  -to check blood pressure and bring to next OV -heart healthy diet -cont Toprol, norvasc and cozaar  - metoprolol succinate (TOPROL-XL) 50 MG 24 hr tablet; Take 1 tablet (50 mg total) by mouth daily. Take with or immediately following a meal.  Dispense: 90 tablet; Refill: 1  2. Gastroesophageal reflux disease without esophagitis -to cont lifestyle modifications and medication - omeprazole (PRILOSEC) 20 MG capsule; Take 1 capsule (20 mg total) by mouth daily.  Dispense: 90 capsule; Refill: 1  3. Dementia, without behavioral disturbance -MMSE of 27/30 however very poor historian, repeats same question multiple times during visit and unsure about most of her medical history.  -will increase aricept 10 mg daily - donepezil (ARICEPT) 10 MG tablet; Take 1 tablet (10 mg total) by mouth at bedtime.  Dispense: 30 tablet; Refill: 3  4. Hypothyroidism due to  acquired atrophy of thyroid TSH stable, cont current synthroid  5. CKD (chronic kidney disease) stage 4, GFR 15-29 ml/min (HCC) Reviewed care everywhere and it appears renal function has been stable from previous labs. Will stop HCTZ at this time due to elevated BUN/CR  6. Encounter for eye exam - Ambulatory referral to Ophthalmology   Follow up in 2 weeks on blood pressure  Chala Gul K. Harle Battiest  [facility] [phone] 8 am - 5 pm) [phone] (after hours)

## 2015-09-13 ENCOUNTER — Other Ambulatory Visit: Payer: Self-pay | Admitting: Nurse Practitioner

## 2015-09-13 NOTE — Telephone Encounter (Signed)
Please advise if you will be refilling this medication

## 2015-09-26 ENCOUNTER — Encounter: Payer: Self-pay | Admitting: Nurse Practitioner

## 2015-09-26 ENCOUNTER — Ambulatory Visit (INDEPENDENT_AMBULATORY_CARE_PROVIDER_SITE_OTHER): Payer: Medicare Other | Admitting: Nurse Practitioner

## 2015-09-26 VITALS — BP 132/82 | HR 72 | Temp 98.1°F | Resp 18 | Ht 63.0 in | Wt 197.2 lb

## 2015-09-26 DIAGNOSIS — C50912 Malignant neoplasm of unspecified site of left female breast: Secondary | ICD-10-CM | POA: Diagnosis not present

## 2015-09-26 DIAGNOSIS — I10 Essential (primary) hypertension: Secondary | ICD-10-CM

## 2015-09-26 DIAGNOSIS — F0390 Unspecified dementia without behavioral disturbance: Secondary | ICD-10-CM | POA: Diagnosis not present

## 2015-09-26 DIAGNOSIS — F329 Major depressive disorder, single episode, unspecified: Secondary | ICD-10-CM | POA: Diagnosis not present

## 2015-09-26 DIAGNOSIS — F32A Depression, unspecified: Secondary | ICD-10-CM

## 2015-09-26 NOTE — Patient Instructions (Addendum)
Decrease prozac to 10 mg (1/2 tablet) daily for 1 week, then decrease to 1/2 tablet every other day for 1 week then stop  Cont on losartan daily, metoprolol succinate daily, and amlodipine daily for blood pressure  Will follow up lab work today  need a bigger cuff to get an accurate blood pressure reading

## 2015-09-26 NOTE — Progress Notes (Signed)
Careteam: Patient Care Team: [name], NP as PCP - General (Geriatric Medicine) Dustin Folks, MD as Consulting Physician (Hematology and Oncology) Joellyn Haff, MD as Consulting Physician (Obstetrics and Gynecology)  Advanced Directive information Does patient have an advance directive?: No, Would patient like information on creating an advanced directive?: No - patient declined information  Allergies  Allergen Reactions  . Neomy-Bacit-Polymyx-Pramoxine     Not sure which antibiotic she has a reaction to    Chief Complaint  Patient presents with  . Medical Management of Chronic Issues    2 week follow up for blood pressure  . Other    Paperwork from [place]      HPI: Patient is a 81 y.o. female seen in the office today to follow up blood pressure. Was instructed to stop HTCZ at last visit, reports she did take it out but unsure of the name of medication she stopped when asked .   Blood pressure cuff does not correlate with manual blood pressure in office. Cuff appears too small.   [facility] script sends information due to pt being on prozac and tamoxifen being administered together and reduce the effectiveness of the tamoxifen.  She has been on these together for a few years Has been on prozac for many years due to depression. Has not had any depression recently. Mood is good and she is generally happy.   Increased Aricept to 10 mg daily, has tolerated well. No side effects noted Review of Systems:  Review of Systems  Constitutional: Negative for activity change, appetite change, fatigue and unexpected weight change.  HENT: Negative for congestion and hearing loss.        Long standing hx of tinnitus   Eyes: Negative.   Respiratory: Negative for cough and shortness of breath.   Cardiovascular: Negative for chest pain, palpitations and leg swelling.  Gastrointestinal: Negative for abdominal pain, constipation and diarrhea.       GERD     Genitourinary: Negative for difficulty urinating and dysuria.  Musculoskeletal: Negative for arthralgias and myalgias.  Skin: Negative for color change and wound.  Neurological: Negative for dizziness and weakness.  Psychiatric/Behavioral: Positive for confusion. Negative for agitation and behavioral problems. The patient is not nervous/anxious.        Anxiety and depression controlled on medication    Past Medical History:  Diagnosis Date  . Anemia   . Anxiety   . Arthritis   . Breast cancer ([place])   . Chronic kidney disease   . Dementia   . Depression   . Gastroesophageal reflux disease   . Heartburn   . Hypertension   . Hypothyroidism   . Leg cramps   . Post-menopause bleeding   . Restless leg syndrome 10/07/2014   Past Surgical History:  Procedure Laterality Date  . BREAST SURGERY Left    Lumpectomy   . CATARACT EXTRACTION, BILATERAL    . hysteroscopy biopsy     Social History:   reports that she has quit smoking. She has a 2.00 pack-year smoking history. She has never used smokeless tobacco. She reports that she does not drink alcohol or use drugs.  Family History  Problem Relation Age of Onset  . Hypertension Mother   . Arthritis Mother   . Heart disease Mother   . Hypertension Father   . Heart disease Father   . Cancer Brother   . Breast cancer Paternal Aunt     Medications: Patient's Medications  New Prescriptions  No medications on file  Previous Medications   AMLODIPINE (NORVASC) 5 MG TABLET    Take 5 mg by mouth daily.   ASPIRIN EC 81 MG TABLET    Take 81 mg by mouth daily.   BUSPIRONE (BUSPAR) 7.5 MG TABLET    Take by mouth 2 (two) times daily.    COD LIVER OIL CAPS    Take 1 capsule by mouth 2 (two) times daily.   DONEPEZIL (ARICEPT) 10 MG TABLET    Take 1 tablet (10 mg total) by mouth at bedtime.   FLUOXETINE (PROZAC) 20 MG CAPSULE    Take 1 capsule (20 mg total) by mouth daily.   GARLIC 10 MG CAPS    Take 1 capsule by mouth 2 (two) times  daily.   LEVOTHYROXINE (SYNTHROID, LEVOTHROID) 100 MCG TABLET    Take 100 mcg by mouth daily before breakfast.   LOSARTAN (COZAAR) 50 MG TABLET    Take 50 mg by mouth daily.   MAGNESIUM (CVS TRIPLE MAGNESIUM COMPLEX) 400 MG CAPS    Take by mouth.   METOPROLOL SUCCINATE (TOPROL-XL) 50 MG 24 HR TABLET    Take 1 tablet (50 mg total) by mouth daily. Take with or immediately following a meal.   OMEPRAZOLE (PRILOSEC) 20 MG CAPSULE    Take 1 capsule (20 mg total) by mouth daily.   ROPINIROLE (REQUIP) 1 MG TABLET    Take 1 mg by mouth at bedtime.    TAMOXIFEN (NOLVADEX) 20 MG TABLET    Take 20 mg by mouth daily.   TRAZODONE (DESYREL) 50 MG TABLET    Take 50 mg by mouth at bedtime.   VITAMIN A (CVS VITAMIN A) 10000 UNIT CAPSULE    Take by mouth.  Modified Medications   No medications on file  Discontinued Medications   HYDROCHLOROTHIAZIDE (HYDRODIURIL) 25 MG TABLET    Take 1 tablet (25 mg total) by mouth daily.     Physical Exam:  Vitals:   09/26/15 1416  BP: 132/82  Pulse: 72  Resp: 18  Temp: 98.1 F (36.7 C)  TempSrc: Oral  SpO2: 95%  Weight: 197 lb 3.2 oz (89.4 kg)  Height: 5\' 3"  (1.6 m)   Body mass index is 34.93 kg/m.  Physical Exam  Constitutional: She is oriented to person, place, and time. She appears well-developed and well-nourished. No distress.  HENT:  Head: Normocephalic and atraumatic.  Mouth/Throat: Oropharynx is clear and moist.  Eyes: Conjunctivae are normal. Pupils are equal, round, and reactive to light.  Neck: Normal range of motion. Neck supple.  Cardiovascular: Normal rate, regular rhythm and normal heart sounds.   Pulmonary/Chest: Effort normal and breath sounds normal.  Abdominal: Soft. Bowel sounds are normal.  Musculoskeletal: She exhibits no edema.  Neurological: She is alert and oriented to person, place, and time.  Skin: Skin is warm and dry. She is not diaphoretic.  Psychiatric: She has a normal mood and affect.  Memory loss noted    Labs  reviewed: Basic Metabolic Panel:  Recent Labs  08/10/15 1013  NA 138  K 4.4  CL 104  CO2 24  GLUCOSE 97  BUN 26*  CREATININE 1.86*  CALCIUM 9.1  TSH 0.83   Liver Function Tests:  Recent Labs  08/10/15 1013  AST 21  ALT 14  ALKPHOS 49  BILITOT 0.2  PROT 7.2  ALBUMIN 3.7   No results for input(s): LIPASE, AMYLASE in the last 8760 hours. No results for input(s): AMMONIA in the last  8760 hours. CBC:  Recent Labs  08/10/15 1013  WBC 5.6  NEUTROABS 2,688  HGB 10.5*  HCT 32.1*  MCV 90.7  PLT 255   Lipid Panel:  Recent Labs  08/10/15 1013  CHOL 148  HDL 42*  LDLCALC 81  TRIG 125  CHOLHDL 3.5   TSH:  Recent Labs  08/10/15 1013  TSH 0.83   A1C: No results found for: HGBA1C   Assessment/Plan 1. Essential hypertension -blood pressure controlled off HCTZ, to cont current regimen - Basic metabolic panel  2. Depression -to titrate off prozac, to notify for worsening anxiety or depression  3. Malignant neoplasm of left female breast, unspecified site of breast ([place]) conts on tamoxifen per oncology, prozac may decrease plasma concentrations of tamoxifen.   4. Dementia, without behavioral disturbance Has increased aricept to 10 mg daily, tolerating medication well without side effects  folllow up in 6 weeks  Hatsuko Bizzarro K. Harle Battiest  [facility] [phone] 8 am - 5 pm) [phone] (after hours)

## 2015-09-27 LAB — BASIC METABOLIC PANEL
BUN: 16 mg/dL (ref 7–25)
CO2: 24 mmol/L (ref 20–31)
Calcium: 9 mg/dL (ref 8.6–10.4)
Chloride: 109 mmol/L (ref 98–110)
Creat: 1.51 mg/dL — ABNORMAL HIGH (ref 0.60–0.93)
Glucose, Bld: 100 mg/dL — ABNORMAL HIGH (ref 65–99)
Potassium: 5.1 mmol/L (ref 3.5–5.3)
Sodium: 140 mmol/L (ref 135–146)

## 2015-09-28 ENCOUNTER — Telehealth: Payer: Self-pay

## 2015-09-28 MED ORDER — FLUOXETINE HCL 10 MG PO TABS: ORAL_TABLET | ORAL | 0 refills | Status: DC

## 2015-09-28 NOTE — Telephone Encounter (Signed)
Medication Concern   Patient was told to take 1/2 tablet of prozac- Prozac is a capsules how to titrate

## 2015-09-28 NOTE — Telephone Encounter (Signed)
RX sent, [name] aware

## 2015-09-28 NOTE — Telephone Encounter (Signed)
Okay to call in prozac 10 mg tablet #30/0, this way if she has trouble with the titration we can cut the 10 mg in half as well.

## 2015-10-02 ENCOUNTER — Telehealth: Payer: Self-pay | Admitting: *Deleted

## 2015-10-02 NOTE — Telephone Encounter (Signed)
At last visit she reported she was tolerating the aricept at 10 mg, has she started to titrate off prozac? This may be causing the headaches She can take the aricept during the day to help with the dreams She has been taken off her fluid pill because it was adversely effecting her kidney function She was referred to ophthalmology for diabetic eye exam

## 2015-10-02 NOTE — Telephone Encounter (Signed)
1. [name], called and stated that ever since the increase in patient's Aricept patient has been having headaches, nightmares and talking in her sleep.  2. Also wants to go back on her water pills, feels better on them. BP been running good  3. Also referral for eye exam, patient thought it was just to be for eye exam but the Dr. She was referred to is a retina specialist only.   Please Advise.

## 2015-10-03 NOTE — Telephone Encounter (Signed)
Elder Love stated that patient is taking her medications for over 40 years, thinks she is so use to taking the medications for so long she thinks she is giving some push back. Patient just started last night tapering off of the prozac. Patient stated that her mother and father died of fluid build up on the heart and that's why patient feels like she should be taking them. Patient would rather take all of her medications instead of not taking them. Diet is not so well, patient would rather take something to fix the problem. Daughter just wanted to let you know.

## 2015-10-03 NOTE — Telephone Encounter (Signed)
Noted, we are titration her off the prozac due to interaction with her medication for her breast cancer (makes medication less effective) if they do not want to titrate off and understand the risks she can cont the prozac and we will note that

## 2015-10-09 DIAGNOSIS — H40013 Open angle with borderline findings, low risk, bilateral: Secondary | ICD-10-CM | POA: Diagnosis not present

## 2015-10-09 DIAGNOSIS — H43393 Other vitreous opacities, bilateral: Secondary | ICD-10-CM | POA: Diagnosis not present

## 2015-10-27 ENCOUNTER — Encounter: Payer: Self-pay | Admitting: Nurse Practitioner

## 2015-10-28 ENCOUNTER — Other Ambulatory Visit: Payer: Self-pay | Admitting: Nurse Practitioner

## 2015-11-01 ENCOUNTER — Telehealth: Payer: Self-pay | Admitting: Nurse Practitioner

## 2015-11-01 NOTE — Telephone Encounter (Signed)
left msg asking pt if she can come at 1:15 on 10/30 to meet w/ nurse for AWV before seeing doctor. VDM (dee-dee)

## 2015-11-09 ENCOUNTER — Encounter: Payer: Self-pay | Admitting: Internal Medicine

## 2015-11-09 ENCOUNTER — Ambulatory Visit (INDEPENDENT_AMBULATORY_CARE_PROVIDER_SITE_OTHER): Payer: Medicare Other | Admitting: Internal Medicine

## 2015-11-09 ENCOUNTER — Ambulatory Visit
Admission: RE | Admit: 2015-11-09 | Discharge: 2015-11-09 | Disposition: A | Discharge status: Home / Self Care | Payer: Medicare Other | Source: Ambulatory Visit | Attending: Internal Medicine | Admitting: Internal Medicine

## 2015-11-09 VITALS — BP 150/60 | HR 60 | Temp 97.8°F | Ht 63.0 in | Wt 196.0 lb

## 2015-11-09 DIAGNOSIS — Z170 Estrogen receptor positive status [ER+]: Secondary | ICD-10-CM

## 2015-11-09 DIAGNOSIS — G3184 Mild cognitive impairment, so stated: Secondary | ICD-10-CM

## 2015-11-09 DIAGNOSIS — G4762 Sleep related leg cramps: Secondary | ICD-10-CM | POA: Diagnosis not present

## 2015-11-09 DIAGNOSIS — F325 Major depressive disorder, single episode, in full remission: Secondary | ICD-10-CM | POA: Diagnosis not present

## 2015-11-09 DIAGNOSIS — G8929 Other chronic pain: Secondary | ICD-10-CM

## 2015-11-09 DIAGNOSIS — M545 Low back pain, unspecified: Secondary | ICD-10-CM

## 2015-11-09 DIAGNOSIS — C50912 Malignant neoplasm of unspecified site of left female breast: Secondary | ICD-10-CM | POA: Diagnosis not present

## 2015-11-09 DIAGNOSIS — I10 Essential (primary) hypertension: Secondary | ICD-10-CM | POA: Diagnosis not present

## 2015-11-09 DIAGNOSIS — E034 Atrophy of thyroid (acquired): Secondary | ICD-10-CM | POA: Diagnosis not present

## 2015-11-09 DIAGNOSIS — M47816 Spondylosis without myelopathy or radiculopathy, lumbar region: Secondary | ICD-10-CM | POA: Diagnosis not present

## 2015-11-09 MED ORDER — DONEPEZIL HCL 5 MG PO TABS: 5.0000 mg | ORAL_TABLET | Freq: Every day | ORAL | 3 refills | Status: DC

## 2015-11-09 NOTE — Progress Notes (Signed)
Location:  [facility] clinic Provider:  Clarabel Marion L. Mariea Clonts, D.O., C.M.D. PCP:  Sherrie Mustache, NP  Code Status: full code Goals of Care:   Need to be addressed Advanced Directives 09/26/2015  Does patient have an advance directive? No  Would patient like information on creating an advanced directive? No - patient declined information   Chief Complaint  Patient presents with  . Follow-up    depression, discuss aricept dosage    HPI: Patient is a 81 y.o. female with h/o htn, depression, breast cancer on tamoxifen, and dementia seen today for medical management of chronic diseases/physician visit.  She is here to f/u on depression and possibly to change her aricept dosage.  She is feeling ok.  She is off the prozac now.  She has had some slight headaches, but dealing with inner ear infections and allergies with some rhinitis.    She says she started to get hallucination with the 10mg  of aricept--sounds like they were nightmares of people walking into her room and through windows and standing there by her bed.  Does take trazodone for sleep.  Falls asleep but the sleep is restless w/o it.    27/30 on memory test here in august.    Reflux better lately--no symptoms.  Takes tylenol regularly for back pain.  She calls it arthritis.  It's worse if she is going upstairs, standing a long time, better bending over.    She's had postmenopausal bleeding and had an endometrial polyp and atrophic endometrium after hysteroscopy and d/c.  No malignancy. She had a pap 11/09/15.   Past Medical History:  Diagnosis Date  . Anemia   . Anxiety   . Arthritis   . Breast cancer ([place])   . Chronic kidney disease   . Dementia   . Depression   . Gastroesophageal reflux disease   . Heartburn   . Hypertension   . Hypothyroidism   . Leg cramps   . Post-menopause bleeding   . Restless leg syndrome 10/07/2014    Past Surgical History:  Procedure Laterality Date  . BREAST SURGERY Left    Lumpectomy   .  CATARACT EXTRACTION, BILATERAL    . hysteroscopy biopsy      Allergies  Allergen Reactions  . Neomy-Bacit-Polymyx-Pramoxine     Not sure which antibiotic she has a reaction to      Medication List       Accurate as of 11/09/15  1:25 PM. Always use your most recent med list.          amLODipine 5 MG tablet Commonly known as:  NORVASC Take 5 mg by mouth daily.   aspirin EC 81 MG tablet Take 81 mg by mouth daily.   busPIRone 7.5 MG tablet Commonly known as:  BUSPAR Take by mouth 2 (two) times daily.   Cod Liver Oil Caps Take 1 capsule by mouth 2 (two) times daily.   [employer] TRIPLE MAGNESIUM COMPLEX 400 MG Caps Generic drug:  Magnesium Take by mouth.   [employer]S VITAMIN A 16109 UNIT capsule Generic drug:  vitamin A Take by mouth.   donepezil 10 MG tablet Commonly known as:  ARICEPT Take 1 tablet (10 mg total) by mouth at bedtime.   Garlic 10 MG Caps Take 1 capsule by mouth 2 (two) times daily.   levothyroxine 100 MCG tablet Commonly known as:  SYNTHROID, LEVOTHROID Take 100 mcg by mouth daily before breakfast.   losartan 50 MG tablet Commonly known as:  COZAAR Take 50 mg  by mouth daily.   metoprolol succinate 50 MG 24 hr tablet Commonly known as:  TOPROL-XL Take 1 tablet (50 mg total) by mouth daily. Take with or immediately following a meal.   omeprazole 20 MG capsule Commonly known as:  PRILOSEC Take 1 capsule (20 mg total) by mouth daily.   rOPINIRole 1 MG tablet Commonly known as:  REQUIP Take 1 mg by mouth at bedtime.   tamoxifen 20 MG tablet Commonly known as:  NOLVADEX Take 20 mg by mouth daily.   traZODone 50 MG tablet Commonly known as:  DESYREL Take 50 mg by mouth at bedtime.       Review of Systems:  Review of Systems  Constitutional: Negative for chills, fever, malaise/fatigue and weight loss.  HENT: Negative for congestion.   Eyes: Negative for blurred vision.  Respiratory: Negative for cough and shortness of breath.     Cardiovascular: Negative for chest pain, palpitations and leg swelling.  Gastrointestinal: Negative for abdominal pain, blood in stool, constipation and melena.  Genitourinary: Negative for dysuria.  Musculoskeletal: Positive for back pain and joint pain. Negative for falls.  Skin: Negative for itching and rash.  Neurological: Positive for headaches. Negative for dizziness, loss of consciousness and weakness.  Psychiatric/Behavioral: Positive for memory loss. Negative for depression. The patient is not nervous/anxious and does not have insomnia.     Health Maintenance  Topic Date Due  . TETANUS/TDAP  04/13/1955  . ZOSTAVAX  04/12/1996  . PNA vac Low Risk Adult (2 of 2 - PPSV23) 10/26/2015  . INFLUENZA VACCINE  Completed  . DEXA SCAN  Completed    Physical Exam: Vitals:   11/09/15 1311  BP: (!) 150/60  Pulse: 60  Temp: 97.8 F (36.6 C)  TempSrc: Oral  SpO2: 98%  Weight: 196 lb (88.9 kg)  Height: 5\' 3"  (1.6 m)   Body mass index is 34.72 kg/m. Physical Exam  Constitutional: She appears well-developed and well-nourished. No distress.  HENT:  Head: Normocephalic and atraumatic.  Cardiovascular: Normal rate, regular rhythm, normal heart sounds and intact distal pulses.   Pulmonary/Chest: Effort normal and breath sounds normal. No respiratory distress.  Abdominal: Soft. Bowel sounds are normal.  Musculoskeletal: Normal range of motion.  Tenderness of sacroiliac regions bilaterally, left knee  Neurological: She is alert.  Some mild short term memory loss  Skin: Skin is warm and dry.  Psychiatric: She has a normal mood and affect.    Labs reviewed: Basic Metabolic Panel:  Recent Labs  08/10/15 1013 09/26/15 1518  NA 138 140  K 4.4 5.1  CL 104 109  CO2 24 24  GLUCOSE 97 100*  BUN 26* 16  CREATININE 1.86* 1.51*  CALCIUM 9.1 9.0  TSH 0.83  --    Liver Function Tests:  Recent Labs  08/10/15 1013  AST 21  ALT 14  ALKPHOS 49  BILITOT 0.2  PROT 7.2  ALBUMIN  3.7   No results for input(s): LIPASE, AMYLASE in the last 8760 hours. No results for input(s): AMMONIA in the last 8760 hours. CBC:  Recent Labs  08/10/15 1013  WBC 5.6  NEUTROABS 2,688  HGB 10.5*  HCT 32.1*  MCV 90.7  PLT 255   Lipid Panel:  Recent Labs  08/10/15 1013  CHOL 148  HDL 42*  LDLCALC 81  TRIG 125  CHOLHDL 3.5   Assessment/Plan 1. Major depressive disorder with single episode, in full remission ([place]) -resolved, off prozac, monitor, continues buspar for anxiety  2. Mild cognitive  impairment with memory loss -did not tolerate 10mg  aricept due to nightmares so reduce dose to 5mg  -pt also wants 90 day supplies of all meds - donepezil (ARICEPT) 5 MG tablet; Take 1 tablet (5 mg total) by mouth at bedtime.  Dispense: 90 tablet; Refill: 3  3. Essential hypertension -bp elevated today with repeat still 140/70 -has appt coming up with Janett Billow to review this  4. Hypothyroidism due to acquired atrophy of thyroid -cont current levothyroxine,  Lab Results  Component Value Date   TSH 0.83 08/10/2015    5. Malignant neoplasm of left breast in female, estrogen receptor positive, unspecified site of breast ([place]) -s/p lumpectomy, on tamoxifen (had been held temporarily during postmenopausal bleeding workup and restarted)  6. Nocturnal leg cramps -ongoing, cont requip, would prefer not to increase due to risks of worsening confusion from higher doses  7. Chronic midline low back pain without sciatica - cont use of tylenol for pain - Ambulatory referral to Physical Therapy for stretches and exercises for her lower back - DG Lumbar Spine Complete; Future to r/o bone mets with her h/o breast cancer  Had flu shot at specialist thru [facility] 09/11/15  Labs/tests ordered:   Orders Placed This Encounter  Procedures  . DG Lumbar Spine Complete    Standing Status:   Future    Number of Occurrences:   1    Standing Expiration Date:   01/08/2017    Order Specific Question:    Reason for Exam (SYMPTOM  OR DIAGNOSIS REQUIRED)    Answer:   low back pain, worse with activity and prolonged standing, breast cancer on tamoxifen    Order Specific Question:   Preferred imaging location?    Answer:   [facility]  . Ambulatory referral to Physical Therapy    Referral Priority:   Routine    Referral Type:   Physical Medicine    Referral Reason:   Specialty Services Required    Requested Specialty:   Physical Therapy    Number of Visits Requested:   1   Next appt:  11/13/2015   Raykwon Hobbs L. Kani Chauvin, D.O. [facility] [address], [place] [address] Cell Phone (Mon-Fri 8am-5pm):  [phone] On Call:  [phone] & follow prompts after 5pm & weekends Office Phone:  [phone] Office Fax:  [phone]

## 2015-11-10 ENCOUNTER — Encounter: Payer: Self-pay | Admitting: *Deleted

## 2015-11-11 ENCOUNTER — Other Ambulatory Visit: Payer: Self-pay | Admitting: Nurse Practitioner

## 2015-11-13 ENCOUNTER — Ambulatory Visit (INDEPENDENT_AMBULATORY_CARE_PROVIDER_SITE_OTHER): Payer: Medicare Other

## 2015-11-13 ENCOUNTER — Ambulatory Visit (INDEPENDENT_AMBULATORY_CARE_PROVIDER_SITE_OTHER): Payer: Medicare Other | Admitting: Nurse Practitioner

## 2015-11-13 ENCOUNTER — Encounter: Payer: Self-pay | Admitting: Nurse Practitioner

## 2015-11-13 VITALS — BP 168/70 | HR 67 | Temp 98.3°F | Ht 64.0 in | Wt 195.0 lb

## 2015-11-13 VITALS — BP 152/74 | HR 67 | Temp 98.3°F | Resp 18 | Ht 64.0 in | Wt 195.0 lb

## 2015-11-13 DIAGNOSIS — G3184 Mild cognitive impairment, so stated: Secondary | ICD-10-CM

## 2015-11-13 DIAGNOSIS — I10 Essential (primary) hypertension: Secondary | ICD-10-CM | POA: Diagnosis not present

## 2015-11-13 DIAGNOSIS — Z23 Encounter for immunization: Secondary | ICD-10-CM

## 2015-11-13 DIAGNOSIS — Z0000 Encounter for general adult medical examination without abnormal findings: Secondary | ICD-10-CM | POA: Diagnosis not present

## 2015-11-13 MED ORDER — BUSPIRONE HCL 7.5 MG PO TABS: 7.5000 mg | ORAL_TABLET | Freq: Two times a day (BID) | ORAL | 1 refills | Status: DC

## 2015-11-13 MED ORDER — LEVOTHYROXINE SODIUM 100 MCG PO TABS
100.0000 ug | ORAL_TABLET | Freq: Every day | ORAL | 1 refills | Status: DC
Start: 2015-11-13 — End: 2016-05-05

## 2015-11-13 MED ORDER — AMLODIPINE BESYLATE 10 MG PO TABS: 10.0000 mg | ORAL_TABLET | Freq: Every day | ORAL | 3 refills | Status: DC

## 2015-11-13 MED ORDER — ROPINIROLE HCL 1 MG PO TABS: 1.0000 mg | ORAL_TABLET | Freq: Every day | ORAL | 1 refills | Status: DC

## 2015-11-13 MED ORDER — TIZANIDINE HCL 4 MG PO TABS: 4.0000 mg | ORAL_TABLET | Freq: Every day | ORAL | 1 refills | Status: DC

## 2015-11-13 NOTE — Progress Notes (Signed)
Subjective:   Regina Porter is a 81 y.o. female who presents for an Initial Medicare Annual Wellness Visit.  Review of Systems     Cardiac Risk Factors include: advanced age (>46men, >25 women);hypertension     Objective:    Today's Vitals   11/13/15 1318  BP: (!) 168/70  Pulse: 67  Temp: 98.3 F (36.8 C)  TempSrc: Oral  SpO2: 97%  Weight: 195 lb (88.5 kg)  Height: 5\' 4"  (1.626 m)  PainSc: 7    Body mass index is 33.47 kg/m.   Current Medications (verified) Outpatient Encounter Prescriptions as of 11/13/2015  Medication Sig  . acetaminophen (TYLENOL) 325 MG tablet Take 650 mg by mouth as needed.  Marland Kitchen amLODipine (NORVASC) 5 MG tablet Take 5 mg by mouth daily.  Marland Kitchen aspirin EC 81 MG tablet Take 81 mg by mouth daily.  . busPIRone (BUSPAR) 7.5 MG tablet Take by mouth 2 (two) times daily.   Marland Kitchen Cod Liver Oil CAPS Take 1 capsule by mouth 2 (two) times daily.  Marland Kitchen donepezil (ARICEPT) 5 MG tablet Take 1 tablet (5 mg total) by mouth at bedtime.  . Garlic 10 MG CAPS Take 1 capsule by mouth 2 (two) times daily.  Marland Kitchen levothyroxine (SYNTHROID, LEVOTHROID) 100 MCG tablet Take 100 mcg by mouth daily before breakfast.  . losartan (COZAAR) 50 MG tablet Take 50 mg by mouth daily.  . Magnesium (CVS TRIPLE MAGNESIUM COMPLEX) 400 MG CAPS Take by mouth.  . metoprolol succinate (TOPROL-XL) 50 MG 24 hr tablet Take 1 tablet (50 mg total) by mouth daily. Take with or immediately following a meal.  . omeprazole (PRILOSEC) 20 MG capsule Take 1 capsule (20 mg total) by mouth daily.  Marland Kitchen rOPINIRole (REQUIP) 1 MG tablet Take 1 mg by mouth at bedtime.   . tamoxifen (NOLVADEX) 20 MG tablet Take 20 mg by mouth daily.  Marland Kitchen tiZANidine (ZANAFLEX) 4 MG tablet Take 4 mg by mouth once.  . traZODone (DESYREL) 50 MG tablet TAKE 1 TABLET (50 MG TOTAL) BY MOUTH NIGHTLY. FOR SLEEP  . vitamin A (CVS VITAMIN A) 10000 UNIT capsule Take by mouth.   No facility-administered encounter medications on file as of 11/13/2015.      Allergies (verified) Neomy-bacit-polymyx-pramoxine   History: Past Medical History:  Diagnosis Date  . Anemia   . Anxiety   . Arthritis   . Breast cancer ([place])   . Chronic kidney disease   . Dementia   . Depression   . Gastroesophageal reflux disease   . Heartburn   . Hypertension   . Hypothyroidism   . Leg cramps   . Post-menopause bleeding   . Restless leg syndrome 10/07/2014   Past Surgical History:  Procedure Laterality Date  . BREAST SURGERY Left    Lumpectomy   . CATARACT EXTRACTION, BILATERAL    . hysteroscopy biopsy     Family History  Problem Relation Age of Onset  . Hypertension Mother   . Arthritis Mother   . Heart disease Mother   . Hypertension Father   . Heart disease Father   . Cancer Brother   . Breast cancer Paternal Aunt    Social History   Occupational History  . Retired    Social History Main Topics  . Smoking status: Former Smoker    Packs/day: 0.10    Years: 20.00  . Smokeless tobacco: Never Used  . Alcohol use No  . Drug use: No  . Sexual activity: No    Tobacco  Counseling Counseling given: No   Activities of Daily Living In your present state of health, do you have any difficulty performing the following activities: 11/13/2015  Hearing? N  Vision? N  Difficulty concentrating or making decisions? N  Walking or climbing stairs? N  Dressing or bathing? N  Doing errands, shopping? N  Preparing Food and eating ? N  Using the Toilet? N  In the past six months, have you accidently leaked urine? N  Do you have problems with loss of bowel control? N  Managing your Medications? N  Managing your Finances? N  Housekeeping or managing your Housekeeping? Y  Some recent data might be hidden    Immunizations and Health Maintenance Immunization History  Administered Date(s) Administered  . Influenza,inj,Quad PF,36+ Mos 09/11/2015  . Influenza-Unspecified 10/26/2014  . Pneumococcal Conjugate-13 10/26/2014  . Pneumococcal  Polysaccharide-23 11/13/2015   Health Maintenance Due  Topic Date Due  . TETANUS/TDAP  04[birth year]13/1955  . ZOSTAVAX  04/12/1996    Patient Care Team: [name], NP as PCP - General (Geriatric Medicine) Dustin Folks, MD as Consulting Physician (Hematology and Oncology) Joellyn Haff, MD as Consulting Physician (Obstetrics and Gynecology)  Indicate any recent Medical Services you may have received from other than [facility] providers in the past year (date may be approximate).     Assessment:   This is a routine wellness examination for Regina Porter.   Hearing/Vision screen Hearing Screening Comments: Unsure of last hearing screen.  Vision Screening Comments: Next vision exam is on 11/15/15.   Dietary issues and exercise activities discussed: Current Exercise Habits: The patient does not participate in regular exercise at present  Goals    . Increase water intake          Starting 11/13/15, I will attempt to increase my water intake from 4 glasses to 6 glasses per day and try to add in exercise (walking around house) dailly.       Depression Screen PHQ 2/9 Scores 11/13/2015 08/10/2015  PHQ - 2 Score 0 0    Fall Risk Fall Risk  11/13/2015 09/26/2015 09/11/2015 08/10/2015  Falls in the past year? No No No No    Cognitive Function: MMSE - Mini Mental State Exam 09/11/2015  Orientation to time 4  Orientation to Place 4  Registration 3  Attention/ Calculation 5  Recall 2  Language- name 2 objects 2  Language- repeat 1  Language- follow 3 step command 3  Language- read & follow direction 1  Write a sentence 1  Copy design 1  Total score 27        Screening Tests Health Maintenance  Topic Date Due  . TETANUS/TDAP  04[birth year]13/1955  . ZOSTAVAX  04/12/1996  . INFLUENZA VACCINE  Completed  . DEXA SCAN  Completed  . PNA vac Low Risk Adult  Completed      Plan:    I have personally reviewed and addressed the Medicare Annual Wellness questionnaire and have noted the  following in the patient's chart:  A. Medical and social history B. Use of alcohol, tobacco or illicit drugs  C. Current medications and supplements D. Functional ability and status E.  Nutritional status F.  Physical activity G. Advance directives H. List of other physicians I.  Hospitalizations, surgeries, and ER visits in previous 12 months J.  [place] to include hearing, vision, cognitive, depression L. Referrals and appointments - none  In addition, I have reviewed and discussed with patient certain preventive protocols, quality metrics,  and best practice recommendations. A written personalized care plan for preventive services as well as general preventive health recommendations were provided to patient.  See attached scanned questionnaire for additional information.   Signed,   Allyn Kenner, LPN Health Advisor    I reviewed health advisors note, was available for consultation and agree with documentation and plan.  Carlos American. Harle Battiest  [facility] [phone] 8 am - 5 pm) [phone] (after hours)

## 2015-11-13 NOTE — Patient Instructions (Signed)
Regina Porter , Thank you for taking time to come for your Medicare Wellness Visit. I appreciate your ongoing commitment to your health goals. Please review the following plan we discussed and let me know if I can assist you in the future.   These are the goals we discussed: Goals    . Increase water intake          Starting 11/13/15, I will attempt to increase my water intake from 4 glasses to 6 glasses per day and try to add in exercise (walking around house) dailly.        This is a list of the screening recommended for you and due dates:  Health Maintenance  Topic Date Due  . Tetanus Vaccine  04/13/1953  . Shingles Vaccine  04/12/1996  . Flu Shot  Completed  . DEXA scan (bone density measurement)  Completed  . Pneumonia vaccines  Completed  Preventive Care for Adults  A healthy lifestyle and preventive care can promote health and wellness. Preventive health guidelines for adults include the following key practices.  . A routine yearly physical is a good way to check with your health care provider about your health and preventive screening. It is a chance to share any concerns and updates on your health and to receive a thorough exam.  . Visit your dentist for a routine exam and preventive care every 6 months. Brush your teeth twice a day and floss once a day. Good oral hygiene prevents tooth decay and gum disease.  . The frequency of eye exams is based on your age, health, family medical history, use  of contact lenses, and other factors. Follow your health care provider's ecommendations for frequency of eye exams.  . Eat a healthy diet. Foods like vegetables, fruits, whole grains, low-fat dairy products, and lean protein foods contain the nutrients you need without too many calories. Decrease your intake of foods high in solid fats, added sugars, and salt. Eat the right amount of calories for you. Get information about a proper diet from your health care provider, if necessary.  .  Regular physical exercise is one of the most important things you can do for your health. Most adults should get at least 150 minutes of moderate-intensity exercise (any activity that increases your heart rate and causes you to sweat) each week. In addition, most adults need muscle-strengthening exercises on 2 or more days a week.  Silver Sneakers may be a benefit available to you. To determine eligibility, you may visit the website: www.silversneakers.com or contact program at 716 241 2834 Mon-Fri between 8AM-8PM.   . Maintain a healthy weight. The body mass index (BMI) is a screening tool to identify possible weight problems. It provides an estimate of body fat based on height and weight. Your health care provider can find your BMI and can help you achieve or maintain a healthy weight.   For adults 20 years and older: ? A BMI below 18.5 is considered underweight. ? A BMI of 18.5 to 24.9 is normal. ? A BMI of 25 to 29.9 is considered overweight. ? A BMI of 30 and above is considered obese.   . Maintain normal blood lipids and cholesterol levels by exercising and minimizing your intake of saturated fat. Eat a balanced diet with plenty of fruit and vegetables. Blood tests for lipids and cholesterol should begin at age 50 and be repeated every 5 years. If your lipid or cholesterol levels are high, you are over 50, or you are at  high risk for heart disease, you may need your cholesterol levels checked more frequently. Ongoing high lipid and cholesterol levels should be treated with medicines if diet and exercise are not working.  . If you smoke, find out from your health care provider how to quit. If you do not use tobacco, please do not start.  . If you choose to drink alcohol, please do not consume more than 2 drinks per day. One drink is considered to be 12 ounces (355 mL) of beer, 5 ounces (148 mL) of wine, or 1.5 ounces (44 mL) of liquor.  . If you are 43-1 years old, ask your health care  provider if you should take aspirin to prevent strokes.  . Use sunscreen. Apply sunscreen liberally and repeatedly throughout the day. You should seek shade when your shadow is shorter than you. Protect yourself by wearing long sleeves, pants, a wide-brimmed hat, and sunglasses year round, whenever you are outdoors.  . Once a month, do a whole body skin exam, using a mirror to look at the skin on your back. Tell your health care provider of new moles, moles that have irregular borders, moles that are larger than a pencil eraser, or moles that have changed in shape or color.

## 2015-11-13 NOTE — Progress Notes (Signed)
Careteam: Patient Care Team: [name], NP as PCP - General (Geriatric Medicine) Dustin Folks, MD as Consulting Physician (Hematology and Oncology) Joellyn Haff, MD as Consulting Physician (Obstetrics and Gynecology)  Advanced Directive information Does patient have an advance directive?: No, Would patient like information on creating an advanced directive?: Yes - Educational materials given  Allergies  Allergen Reactions  . Neomy-Bacit-Polymyx-Pramoxine     Not sure which antibiotic she has a reaction to    Chief Complaint  Patient presents with  . Medical Management of Chronic Issues    2 week follow up for blood pressure     HPI: Patient is a 81 y.o. female seen in the office today to follow up blood pressure.  Pt reports he eats food high in sodium. Eats a lot of popcorn and slim jims. Eating a lot of process foods.  Eating sausage.  Does not eat breakfast.  First meal is after 2pm but stays up late doing puzzles until 3 am (which is early bedtime).  Feels like she sleeps okay once she decides to go to sleep.  Not eating properly due to this sleep schedule. Lives with daughter.  No lower extremity edema.  No shortness of breath or chest pains.  Did not receive results on xray of lumbar spine Did receive a phone call to set up therapist  Review of Systems:  Review of Systems  Constitutional: Negative for activity change, appetite change, fatigue and unexpected weight change.  HENT:       Long standing hx of tinnitus   Eyes: Negative.   Respiratory: Negative for cough and shortness of breath.   Cardiovascular: Negative for chest pain, palpitations and leg swelling.  Gastrointestinal: Negative for constipation and diarrhea.       GERD   Genitourinary: Negative for difficulty urinating.  Musculoskeletal: Positive for back pain.  Skin: Negative for color change and wound.  Neurological: Negative for dizziness and weakness.    Psychiatric/Behavioral: Positive for confusion. Negative for agitation and behavioral problems. The patient is not nervous/anxious.        Anxiety and depression controlled on medication    Past Medical History:  Diagnosis Date  . Anemia   . Anxiety   . Arthritis   . Breast cancer ([place])   . Chronic kidney disease   . Dementia   . Depression   . Gastroesophageal reflux disease   . Heartburn   . Hypertension   . Hypothyroidism   . Leg cramps   . Post-menopause bleeding   . Restless leg syndrome 10/07/2014   Past Surgical History:  Procedure Laterality Date  . BREAST SURGERY Left    Lumpectomy   . CATARACT EXTRACTION, BILATERAL    . hysteroscopy biopsy     Social History:   reports that she has quit smoking. She has a 2.00 pack-year smoking history. She has never used smokeless tobacco. She reports that she does not drink alcohol or use drugs.  Family History  Problem Relation Age of Onset  . Hypertension Mother   . Arthritis Mother   . Heart disease Mother   . Hypertension Father   . Heart disease Father   . Cancer Brother   . Breast cancer Paternal Aunt     Medications: Patient's Medications  New Prescriptions   No medications on file  Previous Medications   ACETAMINOPHEN (TYLENOL) 325 MG TABLET    Take 650 mg by mouth as needed.   AMLODIPINE (NORVASC) 5 MG TABLET  Take 5 mg by mouth daily.   ASPIRIN EC 81 MG TABLET    Take 81 mg by mouth daily.   BUSPIRONE (BUSPAR) 7.5 MG TABLET    Take by mouth 2 (two) times daily.    COD LIVER OIL CAPS    Take 1 capsule by mouth 2 (two) times daily.   DONEPEZIL (ARICEPT) 5 MG TABLET    Take 1 tablet (5 mg total) by mouth at bedtime.   GARLIC 10 MG CAPS    Take 1 capsule by mouth 2 (two) times daily.   LEVOTHYROXINE (SYNTHROID, LEVOTHROID) 100 MCG TABLET    Take 100 mcg by mouth daily before breakfast.   LOSARTAN (COZAAR) 50 MG TABLET    Take 50 mg by mouth daily.   MAGNESIUM ([employer]S TRIPLE MAGNESIUM COMPLEX) 400 MG CAPS     Take by mouth.   METOPROLOL SUCCINATE (TOPROL-XL) 50 MG 24 HR TABLET    Take 1 tablet (50 mg total) by mouth daily. Take with or immediately following a meal.   OMEPRAZOLE (PRILOSEC) 20 MG CAPSULE    Take 1 capsule (20 mg total) by mouth daily.   ROPINIROLE (REQUIP) 1 MG TABLET    Take 1 mg by mouth at bedtime.    TAMOXIFEN (NOLVADEX) 20 MG TABLET    Take 20 mg by mouth daily.   TIZANIDINE (ZANAFLEX) 4 MG TABLET    Take 4 mg by mouth once.   TRAZODONE (DESYREL) 50 MG TABLET    TAKE 1 TABLET (50 MG TOTAL) BY MOUTH NIGHTLY. FOR SLEEP   VITAMIN A (CVS VITAMIN A) 10000 UNIT CAPSULE    Take by mouth.  Modified Medications   No medications on file  Discontinued Medications   No medications on file     Physical Exam:  Vitals:   11/13/15 1413  BP: (!) 152/74  Pulse: 67  Resp: 18  Temp: 98.3 F (36.8 C)  TempSrc: Oral  SpO2: 97%  Weight: 195 lb (88.5 kg)  Height: 5\' 4"  (1.626 m)   Body mass index is 33.47 kg/m.  Physical Exam  Constitutional: She is oriented to person, place, and time. She appears well-developed and well-nourished. No distress.  HENT:  Head: Normocephalic and atraumatic.  Mouth/Throat: Oropharynx is clear and moist.  Eyes: Conjunctivae are normal. Pupils are equal, round, and reactive to light.  Cardiovascular: Normal rate, regular rhythm and normal heart sounds.   Pulmonary/Chest: Effort normal and breath sounds normal.  Musculoskeletal: She exhibits no edema.  Neurological: She is alert and oriented to person, place, and time.  Skin: Skin is warm and dry. She is not diaphoretic.  Psychiatric: She has a normal mood and affect.  Mild short term memory loss    Labs reviewed: Basic Metabolic Panel:  Recent Labs  08/10/15 1013 09/26/15 1518  NA 138 140  K 4.4 5.1  CL 104 109  CO2 24 24  GLUCOSE 97 100*  BUN 26* 16  CREATININE 1.86* 1.51*  CALCIUM 9.1 9.0  TSH 0.83  --    Liver Function Tests:  Recent Labs  08/10/15 1013  AST 21  ALT 14    ALKPHOS 49  BILITOT 0.2  PROT 7.2  ALBUMIN 3.7   No results for input(s): LIPASE, AMYLASE in the last 8760 hours. No results for input(s): AMMONIA in the last 8760 hours. CBC:  Recent Labs  08/10/15 1013  WBC 5.6  NEUTROABS 2,688  HGB 10.5*  HCT 32.1*  MCV 90.7  PLT 255  Lipid Panel:  Recent Labs  08/10/15 1013  CHOL 148  HDL 42*  LDLCALC 81  TRIG 125  CHOLHDL 3.5   TSH:  Recent Labs  08/10/15 1013  TSH 0.83   A1C: No results found for: HGBA1C   Assessment/Plan 1. Essential hypertension -conts to be elevated, pt has poor diet. Discussed heart healthy, low sodium diet -will increase Norvasc at this time.  - amLODipine (NORVASC) 10 MG tablet; Take 1 tablet (10 mg total) by mouth daily.  Dispense: 30 tablet; Refill: 3  2. Mild cognitive impairment with memory loss conts on aricept 5 mg qhs, tolerating well.    Follow up 2-4 weeks  Jessica K. Harle Battiest  [facility] [phone] 8 am - 5 pm) [phone] (after hours)

## 2015-11-13 NOTE — Patient Instructions (Addendum)
Follow up on blood pressure in 2-4 weeks   Please notify your pharmacy of any medication that was stopped that is on automatic refill.   To increase norvasc to 10 mg daily for blood pressure  Please work on changing diet for better blood pressure control   DASH Eating Plan DASH stands for "Dietary Approaches to Stop Hypertension." The DASH eating plan is a healthy eating plan that has been shown to reduce high blood pressure (hypertension). Additional health benefits may include reducing the risk of type 2 diabetes mellitus, heart disease, and stroke. The DASH eating plan may also help with weight loss. WHAT DO I NEED TO KNOW ABOUT THE DASH EATING PLAN? For the DASH eating plan, you will follow these general guidelines:  Choose foods with a percent daily value for sodium of less than 5% (as listed on the food label).  Use salt-free seasonings or herbs instead of table salt or sea salt.  Check with your health care provider or pharmacist before using salt substitutes.  Eat lower-sodium products, often labeled as "lower sodium" or "no salt added."  Eat fresh foods.  Eat more vegetables, fruits, and low-fat dairy products.  Choose whole grains. Look for the word "whole" as the first word in the ingredient list.  Choose fish and skinless chicken or [place] more often than red meat. Limit fish, poultry, and meat to 6 oz (170 g) each day.  Limit sweets, desserts, sugars, and sugary drinks.  Choose heart-healthy fats.  Limit cheese to 1 oz (28 g) per day.  Eat more home-cooked food and less restaurant, buffet, and fast food.  Limit fried foods.  Cook foods using methods other than frying.  Limit canned vegetables. If you do use them, rinse them well to decrease the sodium.  When eating at a restaurant, ask that your food be prepared with less salt, or no salt if possible. WHAT FOODS CAN I EAT? Seek help from a dietitian for individual calorie needs. Grains Whole grain or whole  wheat bread. Brown rice. Whole grain or whole wheat pasta. Quinoa, bulgur, and whole grain cereals. Low-sodium cereals. Corn or whole wheat flour tortillas. Whole grain cornbread. Whole grain crackers. Low-sodium crackers. Vegetables Fresh or frozen vegetables (raw, steamed, roasted, or grilled). Low-sodium or reduced-sodium tomato and vegetable juices. Low-sodium or reduced-sodium tomato sauce and paste. Low-sodium or reduced-sodium canned vegetables.  Fruits All fresh, canned (in natural juice), or frozen fruits. Meat and Other Protein Products Ground beef (85% or leaner), grass-fed beef, or beef trimmed of fat. Skinless chicken or [place]. Ground chicken or [place]. Pork trimmed of fat. All fish and seafood. Eggs. Dried beans, peas, or lentils. Unsalted nuts and seeds. Unsalted canned beans. Dairy Low-fat dairy products, such as skim or 1% milk, 2% or reduced-fat cheeses, low-fat ricotta or cottage cheese, or plain low-fat yogurt. Low-sodium or reduced-sodium cheeses. Fats and Oils Tub margarines without trans fats. Light or reduced-fat mayonnaise and salad dressings (reduced sodium). Avocado. Safflower, olive, or canola oils. Natural peanut or almond butter. Other Unsalted popcorn and pretzels. The items listed above may not be a complete list of recommended foods or beverages. Contact your dietitian for more options. WHAT FOODS ARE NOT RECOMMENDED? Grains White bread. White pasta. White rice. Refined cornbread. Bagels and croissants. Crackers that contain trans fat. Vegetables Creamed or fried vegetables. Vegetables in a cheese sauce. Regular canned vegetables. Regular canned tomato sauce and paste. Regular tomato and vegetable juices. Fruits Dried fruits. Canned fruit in light or heavy syrup.  Fruit juice. Meat and Other Protein Products Fatty cuts of meat. Ribs, chicken wings, bacon, sausage, bologna, salami, chitterlings, fatback, hot dogs, bratwurst, and packaged luncheon meats. Salted  nuts and seeds. Canned beans with salt. Dairy Whole or 2% milk, cream, half-and-half, and cream cheese. Whole-fat or sweetened yogurt. Full-fat cheeses or blue cheese. Nondairy creamers and whipped toppings. Processed cheese, cheese spreads, or cheese curds. Condiments Onion and garlic salt, seasoned salt, table salt, and sea salt. Canned and packaged gravies. Worcestershire sauce. Tartar sauce. Barbecue sauce. Teriyaki sauce. Soy sauce, including reduced sodium. Steak sauce. Fish sauce. Oyster sauce. Cocktail sauce. Horseradish. Ketchup and mustard. Meat flavorings and tenderizers. Bouillon cubes. Hot sauce. Tabasco sauce. Marinades. Taco seasonings. Relishes. Fats and Oils Butter, stick margarine, lard, shortening, ghee, and bacon fat. Coconut, palm kernel, or palm oils. Regular salad dressings. Other Pickles and olives. Salted popcorn and pretzels. The items listed above may not be a complete list of foods and beverages to avoid. Contact your dietitian for more information. WHERE CAN I FIND MORE INFORMATION? National Heart, Lung, and Blood Institute: travelstabloid.com   This information is not intended to replace advice given to you by your health care provider. Make sure you discuss any questions you have with your health care provider.   Document Released: 12/20/2010 Document Revised: 01/21/2014 Document Reviewed: 11/04/2012 Elsevier Interactive Patient Education [employer].

## 2015-11-15 DIAGNOSIS — H409 Unspecified glaucoma: Secondary | ICD-10-CM | POA: Diagnosis not present

## 2015-11-23 ENCOUNTER — Encounter: Payer: Self-pay | Admitting: Physical Therapy

## 2015-11-23 ENCOUNTER — Ambulatory Visit: Payer: Medicare Other | Attending: Internal Medicine | Admitting: Physical Therapy

## 2015-11-23 DIAGNOSIS — R262 Difficulty in walking, not elsewhere classified: Secondary | ICD-10-CM | POA: Diagnosis not present

## 2015-11-23 DIAGNOSIS — M546 Pain in thoracic spine: Secondary | ICD-10-CM | POA: Insufficient documentation

## 2015-11-23 DIAGNOSIS — M545 Low back pain: Secondary | ICD-10-CM | POA: Insufficient documentation

## 2015-11-23 DIAGNOSIS — G8929 Other chronic pain: Secondary | ICD-10-CM | POA: Insufficient documentation

## 2015-11-23 NOTE — Therapy (Signed)
[place], [place], [address] Phone: [phone]   Fax:  [phone]  Physical Therapy Evaluation  Patient Details  Name: Regina Porter MRN: [id number] Date of Birth: 04-04-1934 Referring Provider: Sherrie Mustache  Encounter Date: 11/23/2015      PT End of Session - 11/23/15 1049    Visit Number 1   Date for PT Re-Evaluation 01/18/16   PT Start Time 1005   PT Stop Time 1055   PT Time Calculation (min) 50 min   Activity Tolerance Patient tolerated treatment well   Behavior During Therapy [facility] for tasks assessed/performed      Past Medical History:  Diagnosis Date  . Anemia   . Anxiety   . Arthritis   . Breast cancer ([place])   . Chronic kidney disease   . Dementia   . Depression   . Gastroesophageal reflux disease   . Heartburn   . Hypertension   . Hypothyroidism   . Leg cramps   . Post-menopause bleeding   . Restless leg syndrome 10/07/2014    Past Surgical History:  Procedure Laterality Date  . BREAST SURGERY Left    Lumpectomy   . CATARACT EXTRACTION, BILATERAL    . hysteroscopy biopsy      There were no vitals filed for this visit.       Subjective Assessment - 11/23/15 1004    Subjective Patient comes the Physical therapy with low back pain and bilateral periscapular pain which "started about 5 years ago". She states that the pain comes when she "puts pressure" through the lower extremities. Patient states that her doctor told her she has "bone spurs". She also states that reaching up into cabinets.   Limitations Lifting;Standing;Walking;House hold activities   How long can you stand comfortably? 5 minutes   How long can you walk comfortably? 100 feet   Diagnostic tests Xray: positive for degenerative changes and "bone spurs"   Patient Stated Goals reduce pain, "strengthening body"   Currently in Pain? No/denies  while sitting, no pain   Pain Score --  at worst: 10/10    Pain Location Back   Pain Orientation Left;Right   Pain Descriptors / Indicators Aching;Dull;Constant   Pain Onset Other (comment)  "about 5 years ago"   Pain Frequency Intermittent   Aggravating Factors  standing, walking, movement   Multiple Pain Sites Yes   Pain Score 8   Pain Location Scapula   Pain Orientation Left;Right   Pain Descriptors / Indicators Sharp   Pain Type Chronic pain   Pain Onset Other (comment)  "2 years or so"   Pain Frequency Intermittent            [facility] PT Assessment - 11/23/15 0001      Assessment   Medical Diagnosis LBP from Degenerative changes    Referring Provider Sherrie Mustache   Next MD Visit 2 weeks   Prior Therapy yes  1 year ago     Balance Screen   Has the patient fallen in the past 6 months No     [place] residence   Living Arrangements Children   Type of [place] to enter   Entrance Stairs-Number of Steps 12   Entrance Stairs-Rails Right  going up     Prior Function   Level of Independence Independent   Vocation Retired   Leisure jigsaw puzzles, reading, sewing  ROM / Strength   AROM / PROM / Strength AROM;Strength     AROM   AROM Assessment Site Lumbar   Lumbar Flexion WFL, "relief"   Lumbar Extension WFL, P!   Lumbar - Right Side [facility], P!   Lumbar - Left Side Bend WFL, P!   Lumbar - Right Rotation WFL, "relief"   Lumbar - Left Rotation WFL, "relief"     Strength   Strength Assessment Site Hip;Knee   Right/Left Hip Right;Left   Right Hip Flexion 4+/5   Right Hip ABduction 4+/5  seated   Right Hip ADduction 4+/5  seated   Left Hip Flexion 4+/5   Left Hip ABduction 4+/5  seated   Left Hip ADduction 4+/5  seated   Right/Left Knee Right;Left   Right Knee Flexion 5/5   Right Knee Extension 5/5   Left Knee Flexion 5/5   Left Knee Extension 5/5     Flexibility   Soft Tissue Assessment /Muscle Length yes  gastrocs tight   Hamstrings  lack 20 degrees    Piriformis "tight" pt rports moreso on rt     Palpation   Patella mobility -                   [facility] Adult PT Treatment/Exercise - 11/23/15 0001      Manual Therapy   Manual Therapy Passive ROM   Passive ROM Lumbar rotation, piriformis stretch                PT Education - 11/23/15 1048    Education provided Yes   Education Details HEP: gastroc stretch, piriformis stretch, lumbar rotation in supine   Person(s) Educated Patient   Methods Explanation;Handout   Comprehension Verbalized understanding;Verbal cues required          PT Short Term Goals - 11/23/15 1101      PT SHORT TERM GOAL #1   Title Patient will demonstrate proper recall of HEP.   Time 1   Period Weeks   Status New           PT Long Term Goals - 11/23/15 1102      PT LONG TERM GOAL #1   Title Patient will report decreased pain while perfroming standing activities by 50%.   Time 8   Period Weeks   Status New     PT LONG TERM GOAL #2   Title Patient will demonstrate proper body mechanics while perfroming squats and lifting exercises in order to translate to more functional measures in home environment.   Time 8   Period Weeks   Status New     PT LONG TERM GOAL #3   Title Patient will be able to reach neutral with HS 90/90 lenght test to improve her flexibility.   Time 8   Period Weeks   Status New               Plan - 11/23/15 1050    Clinical Impression Statement Patient tolerated treatment well. Patient is within funtional limits with all lumbar movement and reports symptom relief with lumbar flexion and rotation. LE stretngth is good and she does not report any increased pain with strength assessment. Patient educated on the results of her imaging and the meaning of "degenerative changes" and bone spurs.  Patient also voiced pain in the periscapular region and basic strengthening exercises should be incorporated to strengthen the upper back region  and shoulders.   Rehab Potential Good   PT  Frequency 2x / week   PT Duration 8 weeks   PT Treatment/Interventions ADLs/Self Care Home Management;Moist Heat;Electrical Stimulation;Cryotherapy;Gait training;Stair training;Functional mobility training;Patient/family education;Balance training;Neuromuscular re-education;Therapeutic exercise;Therapeutic activities;Manual techniques;Passive range of motion   PT Next Visit Plan lumbar rotation, TA activation, piriformis stretch, gastroc stretch, scap retract   PT Home Exercise Plan piriformis stretch, gastroc stretch, lumbar supine rotation   Consulted and Agree with Plan of Care Patient      Patient will benefit from skilled therapeutic intervention in order to improve the following deficits and impairments:  Decreased activity tolerance, Decreased endurance, Hypomobility, Difficulty walking, Improper body mechanics, Postural dysfunction, Pain, Decreased balance  Visit Diagnosis: Chronic bilateral low back pain, with sciatica presence unspecified  Pain in thoracic spine  Difficulty in walking, not elsewhere classified     Problem List Patient Active Problem List   Diagnosis Date Noted  . Hypothyroidism   . Hypertension   . Heartburn   . Depression   . Dementia   . Breast cancer ([place])   . Muscle spasm of back 12/19/2014  . Urinary frequency 12/19/2014    [name], SPT 11/23/2015, 11:07 AM  [place], [place], [address] Phone: [phone]   Fax:  [phone]  Name: Regina Porter MRN: [id number] Date of Birth: [birth year]

## 2015-11-28 ENCOUNTER — Ambulatory Visit: Payer: Medicare Other | Admitting: Physical Therapy

## 2015-11-28 ENCOUNTER — Encounter: Payer: Self-pay | Admitting: Physical Therapy

## 2015-11-28 DIAGNOSIS — G8929 Other chronic pain: Secondary | ICD-10-CM | POA: Diagnosis not present

## 2015-11-28 DIAGNOSIS — M546 Pain in thoracic spine: Secondary | ICD-10-CM

## 2015-11-28 DIAGNOSIS — M545 Low back pain: Principal | ICD-10-CM

## 2015-11-28 DIAGNOSIS — R262 Difficulty in walking, not elsewhere classified: Secondary | ICD-10-CM | POA: Diagnosis not present

## 2015-11-28 NOTE — Therapy (Signed)
[facility] [address] [place], [place], [address] Phone: [phone]   Fax:  [phone]  Physical Therapy Treatment  Patient Details  Name: Regina Porter MRN: [id number] Date of Birth: 1934/10/06 Referring Provider: Sherrie Mustache  Encounter Date: 11/28/2015      PT End of Session - 11/28/15 1052    Visit Number 2   Date for PT Re-Evaluation 01/18/16   PT Start Time 1020   PT Stop Time 1110   PT Time Calculation (min) 50 min   Activity Tolerance Patient tolerated treatment well;No increased pain   Behavior During Therapy WFL for tasks assessed/performed      Past Medical History:  Diagnosis Date  . Anemia   . Anxiety   . Arthritis   . Breast cancer ([place])   . Chronic kidney disease   . Dementia   . Depression   . Gastroesophageal reflux disease   . Heartburn   . Hypertension   . Hypothyroidism   . Leg cramps   . Post-menopause bleeding   . Restless leg syndrome 10/07/2014    Past Surgical History:  Procedure Laterality Date  . BREAST SURGERY Left    Lumpectomy   . CATARACT EXTRACTION, BILATERAL    . hysteroscopy biopsy      There were no vitals filed for this visit.      Subjective Assessment - 11/28/15 1023    Subjective Patient states that the initial eval stretches and heat felt "excellent".   Limitations Lifting;Standing;Walking;House hold activities   How long can you stand comfortably? 5 minutes   How long can you walk comfortably? 100 feet   Diagnostic tests Xray: positive for degenerative changes and "bone spurs"   Patient Stated Goals reduce pain, "strengthening body"   Currently in Pain? Yes   Pain Score 5    Pain Location Back   Pain Orientation Right;Left   Pain Descriptors / Indicators Aching;Sore   Pain Type Chronic pain   Pain Onset Other (comment)                         [facility] Adult PT Treatment/Exercise - 11/28/15 0001      High Level Balance    High Level Balance Activities Other (comment)  static stadn on airex, tandem stand on airex   High Level Balance Comments 45 sec stands     Exercises   Exercises Knee/Hip;Lumbar;Shoulder     Lumbar Exercises: Stretches   Lower Trunk Rotation 60 seconds     Lumbar Exercises: Aerobic   Stationary Bike NuStep 6 minutes     Lumbar Exercises: Supine   Other Supine Lumbar Exercises TA activation, 5 sec hold, 10 times, pelvic tilt 3 sec hold, 10 times   Other Supine Lumbar Exercises TA activation with marches x20,      Knee/Hip Exercises: Standing   Knee Flexion AROM;Strengthening;Both  marching, 20x     Knee/Hip Exercises: Supine   Hip Adduction Isometric Strengthening;1 set;Both     Shoulder Exercises: Supine   Flexion 10 reps  with pelvic tilt, red ball     Manual Therapy   Manual Therapy Passive ROM   Passive ROM Gastroc, HS, Piriformis, SKC                PT Education - 11/28/15 1052    Education provided No          PT Short Term Goals - 11/23/15 1101  PT SHORT TERM GOAL #1   Title Patient will demonstrate proper recall of HEP.   Time 1   Period Weeks   Status New           PT Long Term Goals - 11/23/15 1102      PT LONG TERM GOAL #1   Title Patient will report decreased pain while perfroming standing activities by 50%.   Time 8   Period Weeks   Status New     PT LONG TERM GOAL #2   Title Patient will demonstrate proper body mechanics while perfroming squats and lifting exercises in order to translate to more functional measures in home environment.   Time 8   Period Weeks   Status New     PT LONG TERM GOAL #3   Title Patient will be able to reach neutral with HS 90/90 lenght test to improve her flexibility.   Time 8   Period Weeks   Status New               Plan - 11/28/15 1053    Clinical Impression Statement Patient tolerated treatment well and was able to complete all exercises without increased pain/symptoms. Patient  was able to perform airex standing progression with stand by assist and reported "medium" difficulty. Patient demonstrated proper recall with pelvic tilt and TA activation exercises. Continue to preogress per patient tolerance.   Rehab Potential Good   PT Frequency 2x / week   PT Duration 8 weeks   PT Treatment/Interventions ADLs/Self Care Home Management;Moist Heat;Electrical Stimulation;Cryotherapy;Gait training;Stair training;Functional mobility training;Patient/family education;Balance training;Neuromuscular re-education;Therapeutic exercise;Therapeutic activities;Manual techniques;Passive range of motion   PT Next Visit Plan lumbar rotation, TA activation/piriformis stretch, gastroc stretch, scap retract   PT Home Exercise Plan piriformis stretch, gastroc stretch, lumbar supine rotation   Consulted and Agree with Plan of Care Patient      Patient will benefit from skilled therapeutic intervention in order to improve the following deficits and impairments:  Decreased activity tolerance, Decreased endurance, Hypomobility, Difficulty walking, Improper body mechanics, Postural dysfunction, Pain, Decreased balance  Visit Diagnosis: Chronic bilateral low back pain, with sciatica presence unspecified  Pain in thoracic spine  Difficulty in walking, not elsewhere classified     Problem List Patient Active Problem List   Diagnosis Date Noted  . Hypothyroidism   . Hypertension   . Heartburn   . Depression   . Dementia   . Breast cancer ([place])   . Muscle spasm of back 12/19/2014  . Urinary frequency 12/19/2014    Regina Porter, SPT 11/28/2015, 11:02 AM  [place] [address] [place] [address] [place], [place], [address] Phone: [phone]   Fax:  [phone]  Name: Regina Porter MRN: [id number] Date of Birth: November 13, 1934

## 2015-12-01 ENCOUNTER — Ambulatory Visit: Payer: Medicare Other | Admitting: Physical Therapy

## 2015-12-04 ENCOUNTER — Ambulatory Visit: Payer: Medicare Other | Admitting: Nurse Practitioner

## 2015-12-04 ENCOUNTER — Encounter: Payer: Self-pay | Admitting: Nurse Practitioner

## 2015-12-05 ENCOUNTER — Encounter: Payer: Self-pay | Admitting: Physical Therapy

## 2015-12-05 ENCOUNTER — Ambulatory Visit: Payer: Medicare Other | Admitting: Physical Therapy

## 2015-12-05 DIAGNOSIS — G8929 Other chronic pain: Secondary | ICD-10-CM | POA: Diagnosis not present

## 2015-12-05 DIAGNOSIS — R262 Difficulty in walking, not elsewhere classified: Secondary | ICD-10-CM

## 2015-12-05 DIAGNOSIS — M545 Low back pain: Principal | ICD-10-CM

## 2015-12-05 DIAGNOSIS — M546 Pain in thoracic spine: Secondary | ICD-10-CM

## 2015-12-05 NOTE — Therapy (Signed)
[address] [place], [place], [address] Phone: [phone]   Fax:  [phone]  Physical Therapy Treatment  Patient Details  Name: Regina Porter MRN: [id number] Date of Birth: [birth year] Referring Provider: Sherrie Mustache  Encounter Date: 12/05/2015      PT End of Session - 12/05/15 1137    Visit Number 3   Date for PT Re-Evaluation 01/18/16   PT Start Time 1100   PT Stop Time 1157   PT Time Calculation (min) 57 min   Activity Tolerance Patient tolerated treatment well;No increased pain   Behavior During Therapy WFL for tasks assessed/performed      Past Medical History:  Diagnosis Date  . Anemia   . Anxiety   . Arthritis   . Breast cancer ([place])   . Chronic kidney disease   . Dementia   . Depression   . Gastroesophageal reflux disease   . Heartburn   . Hypertension   . Hypothyroidism   . Leg cramps   . Post-menopause bleeding   . Restless leg syndrome 10/07/2014    Past Surgical History:  Procedure Laterality Date  . BREAST SURGERY Left    Lumpectomy   . CATARACT EXTRACTION, BILATERAL    . hysteroscopy biopsy      There were no vitals filed for this visit.      Subjective Assessment - 12/05/15 1101    Subjective Pt reports that it has been painful and miserable    Currently in Pain? Yes   Pain Score 9    Pain Location Back                         [facility] Adult PT Treatment/Exercise - 12/05/15 0001      Lumbar Exercises: Stretches   Passive Hamstring Stretch 3 reps;10 seconds   Single Knee to Chest Stretch 1 rep;10 seconds   Double Knee to Chest Stretch 2 reps;10 seconds   Lower Trunk Rotation 3 reps;10 seconds     Lumbar Exercises: Standing   Shoulder Extension 15 reps;Theraband   Theraband Level (Shoulder Extension) Level 2 (Red)   Other Standing Lumbar Exercises Shoulder hori abduction red x15   Other Standing Lumbar Exercises overhead ext red ball x10     Lumbar Exercises: Seated   Sit to Stand 10 reps  no UE support     Lumbar Exercises: Supine   Bridge 15 reps;2 seconds  x2   Other Supine Lumbar Exercises Seated sit fit pelvic rotations x10 each;  on sit fit Tband Rows red 2x15      Knee/Hip Exercises: Aerobic   Stationary Bike L0 x6 min      Modalities   Modalities Moist Heat     Moist Heat Therapy   Number Minutes Moist Heat 15 Minutes   Moist Heat Location Lumbar Spine                  PT Short Term Goals - 11/23/15 1101      PT SHORT TERM GOAL #1   Title Patient will demonstrate proper recall of HEP.   Time 1   Period Weeks   Status New           PT Long Term Goals - 12/05/15 1140      PT LONG TERM GOAL #1   Title Patient will report decreased pain while perfroming standing activities by 50%.   Status On-going  PT LONG TERM GOAL #2   Title Patient will demonstrate proper body mechanics while perfroming squats and lifting exercises in order to translate to more functional measures in home environment.   Status On-going     PT LONG TERM GOAL #3   Title Patient will be able to reach neutral with HS 90/90 lenght test to improve her flexibility.   Status On-going               Plan - 12/05/15 1137    Clinical Impression Statement Pt entered clinic reporting 9/10 pain. Pt tolerated addition therapeutic exercises without c/o increase pain.  Pt does require cues to maintain appropriate posture throughout treatment session.    Rehab Potential Good   PT Frequency 2x / week   PT Duration 8 weeks   PT Treatment/Interventions ADLs/Self Care Home Management;Moist Heat;Electrical Stimulation;Cryotherapy;Gait training;Stair training;Functional mobility training;Patient/family education;Balance training;Neuromuscular re-education;Therapeutic exercise;Therapeutic activities;Manual techniques;Passive range of motion   PT Next Visit Plan lumbar rotation, TA activation/piriformis stretch, gastroc  stretch, scap retract      Patient will benefit from skilled therapeutic intervention in order to improve the following deficits and impairments:  Decreased activity tolerance, Decreased endurance, Hypomobility, Difficulty walking, Improper body mechanics, Postural dysfunction, Pain, Decreased balance  Visit Diagnosis: Chronic bilateral low back pain, with sciatica presence unspecified  Pain in thoracic spine  Difficulty in walking, not elsewhere classified     Problem List Patient Active Problem List   Diagnosis Date Noted  . Hypothyroidism   . Hypertension   . Heartburn   . Depression   . Dementia   . Breast cancer ([place])   . Muscle spasm of back 12/19/2014  . Urinary frequency 12/19/2014    Scot Jun, PTA 12/05/2015, 11:40 AM  [place] [address] [place], [place], [address] Phone: [phone]   Fax:  [phone]  Name: Regina Porter MRN: [id number] Date of Birth: 07-21-1934

## 2015-12-12 ENCOUNTER — Ambulatory Visit: Payer: Medicare Other | Admitting: Physical Therapy

## 2015-12-12 ENCOUNTER — Encounter: Payer: Self-pay | Admitting: Physical Therapy

## 2015-12-12 DIAGNOSIS — M546 Pain in thoracic spine: Secondary | ICD-10-CM

## 2015-12-12 DIAGNOSIS — G8929 Other chronic pain: Secondary | ICD-10-CM

## 2015-12-12 DIAGNOSIS — M545 Low back pain: Secondary | ICD-10-CM | POA: Diagnosis not present

## 2015-12-12 DIAGNOSIS — R262 Difficulty in walking, not elsewhere classified: Secondary | ICD-10-CM | POA: Diagnosis not present

## 2015-12-12 NOTE — Therapy (Signed)
[place] [address] [place], [place], [address] Phone: [phone]   Fax:  [phone]  Physical Therapy Treatment  Patient Details  Name: Regina Porter MRN: [id number] Date of Birth: 1934-09-20 Referring Provider: Sherrie Mustache  Encounter Date: 12/12/2015      PT End of Session - 12/12/15 1227    Visit Number 4   Date for PT Re-Evaluation 01/18/16   PT Start Time 0932   PT Stop Time 1237   PT Time Calculation (min) 52 min   Activity Tolerance Patient tolerated treatment well;No increased pain   Behavior During Therapy WFL for tasks assessed/performed      Past Medical History:  Diagnosis Date  . Anemia   . Anxiety   . Arthritis   . Breast cancer ([place])   . Chronic kidney disease   . Dementia   . Depression   . Gastroesophageal reflux disease   . Heartburn   . Hypertension   . Hypothyroidism   . Leg cramps   . Post-menopause bleeding   . Restless leg syndrome 10/07/2014    Past Surgical History:  Procedure Laterality Date  . BREAST SURGERY Left    Lumpectomy   . CATARACT EXTRACTION, BILATERAL    . hysteroscopy biopsy      There were no vitals filed for this visit.      Subjective Assessment - 12/12/15 1146    Subjective "We feeling like a 5"   Currently in Pain? Yes   Pain Score 5/10 in low back                          [facility] Adult PT Treatment/Exercise - 12/12/15 0001      Lumbar Exercises: Stretches   Passive Hamstring Stretch 3 reps;10 seconds   Single Knee to Chest Stretch 10 seconds;2 reps   Piriformis Stretch 3 reps;10 seconds     Lumbar Exercises: Aerobic   Stationary Bike NuStep L2 6 minutes     Lumbar Exercises: Seated   Long Arc Quad on Chair 2 sets;10 reps   LAQ on Chair Weights (lbs) 3   Hip Flexion on Ball 10 reps  3lb, not on ball 3x10   Sit to Stand 10 reps  x2, [place] assist from knees      Lumbar Exercises: Supine   Bridge 15 reps;2 seconds   Other Supine Lumbar Exercises Seated Tband rows black 2x15; seated back ext black tband 2x10      Modalities   Modalities Moist Heat     Moist Heat Therapy   Number Minutes Moist Heat 10 Minutes   Moist Heat Location Lumbar Spine                  PT Short Term Goals - 12/12/15 1152      PT SHORT TERM GOAL #1   Title Patient will demonstrate proper recall of HEP.   Status Achieved           PT Long Term Goals - 12/05/15 1140      PT LONG TERM GOAL #1   Title Patient will report decreased pain while performing standing activities by 50%.   Status On-going     PT LONG TERM GOAL #2   Title Patient will demonstrate proper body mechanics while performing squats and lifting exercises in order to translate to more functional measures in home environment.   Status On-going     PT  LONG TERM GOAL #3   Title Patient will be able to reach neutral with HS 90/90 lenght test to improve her flexibility.   Status On-going               Plan - 12/12/15 1228    Clinical Impression Statement Pt able to complete all off today's exercises without c/o symptoms. She does  report quad fatigue after sit to stand interventions.    Rehab Potential Good   PT Frequency 2x / week   PT Duration 8 weeks   PT Treatment/Interventions ADLs/Self Care Home Management;Moist Heat;Electrical Stimulation;Cryotherapy;Gait training;Stair training;Functional mobility training;Patient/family education;Balance training;Neuromuscular re-education;Therapeutic exercise;Therapeutic activities;Manual techniques;Passive range of motion   PT Next Visit Plan lumbar rotation, TA activation/piriformis stretch, gastroc stretch, scap retract      Patient will benefit from skilled therapeutic intervention in order to improve the following deficits and impairments:  Decreased activity tolerance, Decreased endurance, Hypomobility, Difficulty walking, Improper body mechanics, Postural dysfunction, Pain, Decreased  balance  Visit Diagnosis: Chronic bilateral low back pain, with sciatica presence unspecified  Pain in thoracic spine  Difficulty in walking, not elsewhere classified     Problem List Patient Active Problem List   Diagnosis Date Noted  . Hypothyroidism   . Hypertension   . Heartburn   . Depression   . Dementia   . Breast cancer ([place])   . Muscle spasm of back 12/19/2014  . Urinary frequency 12/19/2014    Scot Jun 12/12/2015, 12:30 PM  [place] [address] [place], [place], [address] Phone: [phone]   Fax:  [phone]  Name: Regina Porter MRN: [id number] Date of Birth: 1934/04/22

## 2015-12-14 ENCOUNTER — Ambulatory Visit: Payer: Medicare Other | Admitting: Physical Therapy

## 2015-12-14 ENCOUNTER — Encounter: Payer: Self-pay | Admitting: Physical Therapy

## 2015-12-14 DIAGNOSIS — R262 Difficulty in walking, not elsewhere classified: Secondary | ICD-10-CM

## 2015-12-14 DIAGNOSIS — M546 Pain in thoracic spine: Secondary | ICD-10-CM

## 2015-12-14 DIAGNOSIS — G8929 Other chronic pain: Secondary | ICD-10-CM

## 2015-12-14 DIAGNOSIS — M545 Low back pain: Principal | ICD-10-CM

## 2015-12-14 NOTE — Therapy (Signed)
[place] [address] [address] [place], [place], [address] Phone: [phone]   Fax:  [phone]  Physical Therapy Treatment  Patient Details  Name: Regina Porter MRN: [id number] Date of Birth: 13-Jun-1934 Referring Provider: Sherrie Mustache  Encounter Date: 12/14/2015      PT End of Session - 12/14/15 1225    Visit Number 5   Date for PT Re-Evaluation 01/18/16   PT Start Time 4680   PT Stop Time 3212   PT Time Calculation (min) 50 min   Activity Tolerance Patient tolerated treatment well;No increased pain   Behavior During Therapy WFL for tasks assessed/performed      Past Medical History:  Diagnosis Date  . Anemia   . Anxiety   . Arthritis   . Breast cancer ([place])   . Chronic kidney disease   . Dementia   . Depression   . Gastroesophageal reflux disease   . Heartburn   . Hypertension   . Hypothyroidism   . Leg cramps   . Post-menopause bleeding   . Restless leg syndrome 10/07/2014    Past Surgical History:  Procedure Laterality Date  . BREAST SURGERY Left    Lumpectomy   . CATARACT EXTRACTION, BILATERAL    . hysteroscopy biopsy      There were no vitals filed for this visit.      Subjective Assessment - 12/14/15 1146    Subjective "Im feeling better in my back"   Currently in Pain? Yes   Pain Score 4    Pain Location Back   Pain Orientation Left;Right                         [facility] Adult PT Treatment/Exercise - 12/14/15 0001      Lumbar Exercises: Stretches   Passive Hamstring Stretch 4 reps;10 seconds     Lumbar Exercises: Aerobic   Stationary Bike NuStep L3 6 minutes     Lumbar Exercises: Standing   Row Theraband;15 reps  x2   Theraband Level (Row) Level 2 (Red)   Shoulder Extension Theraband;10 reps  x2   Theraband Level (Shoulder Extension) Level 2 (Red)   Other Standing Lumbar Exercises Rows & Lats 20lb 2x10     Lumbar Exercises: Seated   Long Arc Quad on Chair 2  sets;10 reps   LAQ on Chair Weights (lbs) 3     Knee/Hip Exercises: Seated   Other Seated Knee/Hip Exercises 2x10   Marching Weights 3 lbs.   Hamstring Curl Both;2 sets;15 reps   Hamstring Limitations red Tband      Modalities   Modalities Moist Heat     Moist Heat Therapy   Number Minutes Moist Heat 10 Minutes   Moist Heat Location Lumbar Spine     Manual Therapy   Manual Therapy Passive ROM                  PT Short Term Goals - 12/12/15 1152      PT SHORT TERM GOAL #1   Title Patient will demonstrate proper recall of HEP.   Status Achieved           PT Long Term Goals - 12/14/15 1227      PT LONG TERM GOAL #1   Title Patient will report decreased pain while perfroming standing activities by 50%.   Status Partially Met     PT LONG TERM GOAL #2   Title Patient will  demonstrate proper body mechanics while perfroming squats and lifting exercises in order to translate to more functional measures in home environment.   Status On-going     PT LONG TERM GOAL #3   Title Patient will be able to reach neutral with HS 90/90 lenght test to improve her flexibility.   Status On-going               Plan - 12/14/15 1226    Clinical Impression Statement Pt able to progress to more intense interventions without subjective reports of increase pain. PT does report quat fatigue after LAQ and seated marching   Rehab Potential Good   PT Frequency 2x / week   PT Duration 8 weeks   PT Treatment/Interventions ADLs/Self Care Home Management;Moist Heat;Electrical Stimulation;Cryotherapy;Gait training;Stair training;Functional mobility training;Patient/family education;Balance training;Neuromuscular re-education;Therapeutic exercise;Therapeutic activities;Manual techniques;Passive range of motion   PT Next Visit Plan lumbar rotation, TA activation/piriformis stretch, gastroc stretch, scap retract      Patient will benefit from skilled therapeutic intervention in order  to improve the following deficits and impairments:  Decreased activity tolerance, Decreased endurance, Hypomobility, Difficulty walking, Improper body mechanics, Postural dysfunction, Pain, Decreased balance  Visit Diagnosis: Chronic bilateral low back pain, with sciatica presence unspecified  Pain in thoracic spine  Difficulty in walking, not elsewhere classified     Problem List Patient Active Problem List   Diagnosis Date Noted  . Hypothyroidism   . Hypertension   . Heartburn   . Depression   . Dementia   . Breast cancer ([place])   . Muscle spasm of back 12/19/2014  . Urinary frequency 12/19/2014    Scot Jun 12/14/2015, 12:28 PM  [facility] [place], [place], [address] Phone: [phone]   Fax:  [phone]  Name: Regina Porter MRN: [id number] Date of Birth: 1934-04-09

## 2015-12-27 ENCOUNTER — Other Ambulatory Visit: Payer: Self-pay | Admitting: Nurse Practitioner

## 2015-12-28 ENCOUNTER — Ambulatory Visit: Payer: Medicare Other | Admitting: Physical Therapy

## 2015-12-29 ENCOUNTER — Encounter: Payer: Self-pay | Admitting: Physical Therapy

## 2015-12-29 ENCOUNTER — Ambulatory Visit: Payer: Medicare Other | Attending: Internal Medicine | Admitting: Physical Therapy

## 2015-12-29 DIAGNOSIS — M545 Low back pain: Secondary | ICD-10-CM | POA: Insufficient documentation

## 2015-12-29 DIAGNOSIS — R262 Difficulty in walking, not elsewhere classified: Secondary | ICD-10-CM | POA: Diagnosis not present

## 2015-12-29 DIAGNOSIS — M546 Pain in thoracic spine: Secondary | ICD-10-CM

## 2015-12-29 DIAGNOSIS — G8929 Other chronic pain: Secondary | ICD-10-CM | POA: Diagnosis not present

## 2015-12-29 NOTE — Therapy (Signed)
[place] [address] [place], [place], [address] Phone: [phone]   Fax:  [phone]  Physical Therapy Treatment  Patient Details  Name: Regina Porter MRN: [id number] Date of Birth: May 05, 1934 Referring Provider: Sherrie Mustache  Encounter Date: 12/29/2015      PT End of Session - 12/29/15 1044    Visit Number 6   Date for PT Re-Evaluation 01/18/16   PT Start Time 6962   PT Stop Time 1055   PT Time Calculation (min) 41 min   Activity Tolerance Patient tolerated treatment well;No increased pain   Behavior During Therapy WFL for tasks assessed/performed      Past Medical History:  Diagnosis Date  . Anemia   . Anxiety   . Arthritis   . Breast cancer ([place])   . Chronic kidney disease   . Dementia   . Depression   . Gastroesophageal reflux disease   . Heartburn   . Hypertension   . Hypothyroidism   . Leg cramps   . Post-menopause bleeding   . Restless leg syndrome 10/07/2014    Past Surgical History:  Procedure Laterality Date  . BREAST SURGERY Left    Lumpectomy   . CATARACT EXTRACTION, BILATERAL    . hysteroscopy biopsy      There were no vitals filed for this visit.      Subjective Assessment - 12/29/15 1012    Subjective "I think I been doing pretty good"   Currently in Pain? Yes   Pain Score 6    Pain Location Back   Pain Orientation Lower                         [facility] Adult PT Treatment/Exercise - 12/29/15 0001      Lumbar Exercises: Aerobic   Stationary Bike NuStep L3 6 minutes     Lumbar Exercises: Machines for Strengthening   Cybex Knee Extension 10lb 2x10   Cybex Knee Flexion 20lb 2x10   Other Lumbar Machine Exercise Rows & Lats 20lb 2x10     Lumbar Exercises: Standing   Row Theraband;15 reps  x2   Theraband Level (Row) Level 3 (Green)   Shoulder Extension Theraband;15 reps  22   Theraband Level (Shoulder Extension) Level 3 (Green)     Knee/Hip  Exercises: Aerobic   Stationary Bike L0 x 5 min    Nustep L3 x6 min      Modalities   Modalities Moist Heat     Moist Heat Therapy   Number Minutes Moist Heat 10 Minutes   Moist Heat Location Lumbar Spine                  PT Short Term Goals - 12/12/15 1152      PT SHORT TERM GOAL #1   Title Patient will demonstrate proper recall of HEP.   Status Achieved           PT Long Term Goals - 12/29/15 1046      PT LONG TERM GOAL #1   Title Patient will report decreased pain while perfroming standing activities by 50%.     PT LONG TERM GOAL #2   Title Patient will demonstrate proper body mechanics while perfroming squats and lifting exercises in order to translate to more functional measures in home environment.   Status Partially Met     PT LONG TERM GOAL #3   Title Patient will be able to reach  neutral with HS 90/90 lenght test to improve her flexibility.   Status On-going               Plan - 12/29/15 1049    Clinical Impression Statement Pt reports that she has been unable to attend therapy due to transportation issues. She reports overall improvement but still has some low back pain. completed all of today's interventions well but does show signs of fatigue.   Rehab Potential Good   PT Frequency 2x / week   PT Duration 8 weeks   PT Treatment/Interventions ADLs/Self Care Home Management;Moist Heat;Electrical Stimulation;Cryotherapy;Gait training;Stair training;Functional mobility training;Patient/family education;Balance training;Neuromuscular re-education;Therapeutic exercise;Therapeutic activities;Manual techniques;Passive range of motion   PT Next Visit Plan lumbar rotation, TA activation/piriformis stretch, gastroc stretch, scap retract      Patient will benefit from skilled therapeutic intervention in order to improve the following deficits and impairments:  Decreased activity tolerance, Decreased endurance, Hypomobility, Difficulty walking, Improper  body mechanics, Postural dysfunction, Pain, Decreased balance  Visit Diagnosis: Chronic bilateral low back pain, with sciatica presence unspecified  Pain in thoracic spine  Difficulty in walking, not elsewhere classified     Problem List Patient Active Problem List   Diagnosis Date Noted  . Hypothyroidism   . Hypertension   . Heartburn   . Depression   . Dementia   . Breast cancer ([place])   . Muscle spasm of back 12/19/2014  . Urinary frequency 12/19/2014    Scot Jun 12/29/2015, 10:51 AM  [place], [place], [address] Phone: [phone]   Fax:  [phone]  Name: Regina Porter MRN: [id number] Date of Birth: Oct 30, 1934

## 2016-01-04 ENCOUNTER — Ambulatory Visit: Payer: Medicare Other | Admitting: Physical Therapy

## 2016-01-04 ENCOUNTER — Encounter: Payer: Self-pay | Admitting: Physical Therapy

## 2016-01-04 DIAGNOSIS — R262 Difficulty in walking, not elsewhere classified: Secondary | ICD-10-CM | POA: Diagnosis not present

## 2016-01-04 DIAGNOSIS — M546 Pain in thoracic spine: Secondary | ICD-10-CM | POA: Diagnosis not present

## 2016-01-04 DIAGNOSIS — G8929 Other chronic pain: Secondary | ICD-10-CM

## 2016-01-04 DIAGNOSIS — M545 Low back pain: Principal | ICD-10-CM

## 2016-01-04 NOTE — Therapy (Signed)
[address] [place], [place], [address] Phone: [phone]   Fax:  [phone]  Physical Therapy Treatment  Patient Details  Name: Regina Porter MRN: [id number] Date of Birth: [birth year] Referring Provider: Sherrie Mustache  Encounter Date: 01/04/2016      PT End of Session - 01/04/16 1054    Visit Number 7   Date for PT Re-Evaluation 01/18/16   PT Start Time 1016   PT Stop Time 1102   PT Time Calculation (min) 46 min   Activity Tolerance Patient tolerated treatment well;No increased pain   Behavior During Therapy WFL for tasks assessed/performed      Past Medical History:  Diagnosis Date  . Anemia   . Anxiety   . Arthritis   . Breast cancer ([place])   . Chronic kidney disease   . Dementia   . Depression   . Gastroesophageal reflux disease   . Heartburn   . Hypertension   . Hypothyroidism   . Leg cramps   . Post-menopause bleeding   . Restless leg syndrome 10/07/2014    Past Surgical History:  Procedure Laterality Date  . BREAST SURGERY Left    Lumpectomy   . CATARACT EXTRACTION, BILATERAL    . hysteroscopy biopsy      There were no vitals filed for this visit.      Subjective Assessment - 01/04/16 1018    Subjective Pt states she has been doin gvery good. She has been able to walk without much pain at all.   Currently in Pain? Yes   Pain Score 3    Pain Location Back                         [facility] Adult PT Treatment/Exercise - 01/04/16 0001      High Level Balance   High Level Balance Activities Other (comment)   High Level Balance Comments setaed on physioball; side to side, rotation clockwise and counterclockwise   lost balance once     Lumbar Exercises: Aerobic   Stationary Bike NuStep lvl 4, 6 minutes     Lumbar Exercises: Machines for Strengthening   Cybex Knee Extension 10 lb, 2x10   Cybex Knee Flexion 20 lb 2x10   Leg Press 20 lb, 2x10   Other Lumbar  Machine Exercise Rows and Lats 20 lb 2x10     Lumbar Exercises: Standing   Shoulder Extension Strengthening;Both;10 reps;Theraband  2 sets   Theraband Level (Shoulder Extension) Level 3 (Green)     Moist Heat Therapy   Number Minutes Moist Heat 10 Minutes   Moist Heat Location Lumbar Spine                PT Education - 01/04/16 1054    Education provided No          PT Short Term Goals - 12/12/15 1152      PT SHORT TERM GOAL #1   Title Patient will demonstrate proper recall of HEP.   Status Achieved           PT Long Term Goals - 01/04/16 1056      PT LONG TERM GOAL #1   Status Partially Met     PT LONG TERM GOAL #2   Status On-going     PT LONG TERM GOAL #3   Status On-going               Plan -  01/04/16 1054    Clinical Impression Statement Pt tolerated treatment well and was able to complete all exercises without increased pain. Pt lost balance on physioball one time and needed min assist to regain balance. Pt was then able to complete the exercise with contact guard assist. Progress per pt tolerance.   Rehab Potential Good   PT Frequency 2x / week   PT Duration 8 weeks   PT Treatment/Interventions ADLs/Self Care Home Management;Moist Heat;Electrical Stimulation;Cryotherapy;Gait training;Stair training;Functional mobility training;Patient/family education;Balance training;Neuromuscular re-education;Therapeutic exercise;Therapeutic activities;Manual techniques;Passive range of motion   PT Next Visit Plan lumbar rotation, TA activation/piriformis stretch, gastroc stretch, scap retract   Consulted and Agree with Plan of Care Patient      Patient will benefit from skilled therapeutic intervention in order to improve the following deficits and impairments:  Decreased activity tolerance, Decreased endurance, Hypomobility, Difficulty walking, Improper body mechanics, Postural dysfunction, Pain, Decreased balance  Visit Diagnosis: Chronic bilateral  low back pain, with sciatica presence unspecified  Pain in thoracic spine  Difficulty in walking, not elsewhere classified     Problem List Patient Active Problem List   Diagnosis Date Noted  . Hypothyroidism   . Hypertension   . Heartburn   . Depression   . Dementia   . Breast cancer ([place])   . Muscle spasm of back 12/19/2014  . Urinary frequency 12/19/2014    [name], SPT 01/04/2016, 10:57 AM  [address] [place], [place], [address] Phone: [phone]   Fax:  [phone]  Name: Regina Porter MRN: [id number] Date of Birth: Sep 14, 1934

## 2016-01-16 ENCOUNTER — Telehealth: Payer: Self-pay | Admitting: Physical Therapy

## 2016-01-16 NOTE — Telephone Encounter (Signed)
01/16/16 pt called and cxl all PT appts made at this time

## 2016-01-17 ENCOUNTER — Ambulatory Visit: Payer: Medicare Other | Admitting: Physical Therapy

## 2016-01-22 ENCOUNTER — Ambulatory Visit: Payer: Medicare Other | Admitting: Physical Therapy

## 2016-01-25 ENCOUNTER — Other Ambulatory Visit: Payer: Self-pay | Admitting: Nurse Practitioner

## 2016-01-25 NOTE — Telephone Encounter (Signed)
Okay to give 30 day supply but must schedule follow up for any additional refills.

## 2016-02-07 ENCOUNTER — Other Ambulatory Visit: Payer: Self-pay | Admitting: Nurse Practitioner

## 2016-02-12 ENCOUNTER — Other Ambulatory Visit: Payer: Self-pay | Admitting: *Deleted

## 2016-02-12 DIAGNOSIS — I10 Essential (primary) hypertension: Secondary | ICD-10-CM

## 2016-02-12 MED ORDER — AMLODIPINE BESYLATE 10 MG PO TABS: 10.0000 mg | ORAL_TABLET | Freq: Every day | ORAL | 0 refills | Status: DC

## 2016-02-12 NOTE — Telephone Encounter (Signed)
[facility]  

## 2016-02-25 ENCOUNTER — Other Ambulatory Visit: Payer: Self-pay | Admitting: Nurse Practitioner

## 2016-02-26 NOTE — Telephone Encounter (Signed)
Okay to refill without additional refills until follow up

## 2016-03-07 ENCOUNTER — Other Ambulatory Visit: Payer: Self-pay | Admitting: Nurse Practitioner

## 2016-03-07 DIAGNOSIS — K219 Gastro-esophageal reflux disease without esophagitis: Secondary | ICD-10-CM

## 2016-03-07 DIAGNOSIS — I10 Essential (primary) hypertension: Secondary | ICD-10-CM

## 2016-03-08 ENCOUNTER — Other Ambulatory Visit: Payer: Self-pay | Admitting: *Deleted

## 2016-03-08 DIAGNOSIS — I10 Essential (primary) hypertension: Secondary | ICD-10-CM

## 2016-03-11 ENCOUNTER — Other Ambulatory Visit: Payer: Self-pay | Admitting: *Deleted

## 2016-03-11 DIAGNOSIS — I10 Essential (primary) hypertension: Secondary | ICD-10-CM

## 2016-03-11 NOTE — Telephone Encounter (Signed)
error 

## 2016-04-10 DIAGNOSIS — Z853 Personal history of malignant neoplasm of breast: Secondary | ICD-10-CM | POA: Diagnosis not present

## 2016-04-15 ENCOUNTER — Ambulatory Visit (INDEPENDENT_AMBULATORY_CARE_PROVIDER_SITE_OTHER): Payer: Medicare Other | Admitting: Nurse Practitioner

## 2016-04-15 ENCOUNTER — Encounter: Payer: Self-pay | Admitting: Nurse Practitioner

## 2016-04-15 VITALS — BP 148/74 | HR 74 | Temp 98.7°F | Resp 17 | Ht 64.0 in | Wt 194.4 lb

## 2016-04-15 DIAGNOSIS — G8929 Other chronic pain: Secondary | ICD-10-CM

## 2016-04-15 DIAGNOSIS — G301 Alzheimer's disease with late onset: Secondary | ICD-10-CM | POA: Diagnosis not present

## 2016-04-15 DIAGNOSIS — C50912 Malignant neoplasm of unspecified site of left female breast: Secondary | ICD-10-CM | POA: Diagnosis not present

## 2016-04-15 DIAGNOSIS — M545 Low back pain, unspecified: Secondary | ICD-10-CM

## 2016-04-15 DIAGNOSIS — Z170 Estrogen receptor positive status [ER+]: Secondary | ICD-10-CM | POA: Diagnosis not present

## 2016-04-15 DIAGNOSIS — F0280 Dementia in other diseases classified elsewhere without behavioral disturbance: Secondary | ICD-10-CM

## 2016-04-15 DIAGNOSIS — R002 Palpitations: Secondary | ICD-10-CM

## 2016-04-15 DIAGNOSIS — I10 Essential (primary) hypertension: Secondary | ICD-10-CM

## 2016-04-15 LAB — COMPLETE METABOLIC PANEL WITH GFR
ALT: 15 U/L (ref 6–29)
AST: 21 U/L (ref 10–35)
Albumin: 3.7 g/dL (ref 3.6–5.1)
Alkaline Phosphatase: 47 U/L (ref 33–130)
BUN: 22 mg/dL (ref 7–25)
CO2: 25 mmol/L (ref 20–31)
Calcium: 9.1 mg/dL (ref 8.6–10.4)
Chloride: 106 mmol/L (ref 98–110)
Creat: 1.83 mg/dL — ABNORMAL HIGH (ref 0.60–0.88)
GFR, Est African American: 30 mL/min — ABNORMAL LOW (ref >=60)
GFR, Est Non African American: 26 mL/min — ABNORMAL LOW (ref >=60)
Glucose, Bld: 97 mg/dL (ref 65–99)
Potassium: 4.4 mmol/L (ref 3.5–5.3)
Sodium: 139 mmol/L (ref 135–146)
Total Bilirubin: 0.2 mg/dL (ref 0.2–1.2)
Total Protein: 7 g/dL (ref 6.1–8.1)

## 2016-04-15 LAB — CBC WITH DIFFERENTIAL/PLATELET
Basophils Absolute: 0 cells/uL (ref 0–200)
Basophils Relative: 0 %
Eosinophils Absolute: 100 cells/uL (ref 15–500)
Eosinophils Relative: 2 %
HCT: 33.6 % — ABNORMAL LOW (ref 35.0–45.0)
Hemoglobin: 10.8 g/dL — ABNORMAL LOW (ref 11.7–15.5)
Lymphocytes Relative: 44 %
Lymphs Abs: 2200 cells/uL (ref 850–3900)
MCH: 29.9 pg (ref 27.0–33.0)
MCHC: 32.1 g/dL (ref 32.0–36.0)
MCV: 93.1 fL (ref 80.0–100.0)
MPV: 10.1 fL (ref 7.5–12.5)
Monocytes Absolute: 500 cells/uL (ref 200–950)
Monocytes Relative: 10 %
Neutro Abs: 2200 cells/uL (ref 1500–7800)
Neutrophils Relative %: 44 %
Platelets: 261 10*3/uL (ref 140–400)
RBC: 3.61 MIL/uL — ABNORMAL LOW (ref 3.80–5.10)
RDW: 14.3 % (ref 11.0–15.0)
WBC: 5 10*3/uL (ref 3.8–10.8)

## 2016-04-15 NOTE — Progress Notes (Signed)
Careteam: Patient Care Team: [name], NP as PCP - General (Geriatric Medicine) Dustin Folks, MD as Consulting Physician (Hematology and Oncology) Joellyn Haff, MD as Consulting Physician (Obstetrics and Gynecology)  Advanced Directive information Does Patient Have a Medical Advance Directive?: No  Allergies  Allergen Reactions  . Fish-Derived Products     shellfish  . Neomy-Bacit-Polymyx-Pramoxine     Not sure which antibiotic she has a reaction to    Chief Complaint  Patient presents with  . Medical Management of Chronic Issues    dizzines with heart flutters x 1 month.   . Other    Would like referral to back specialist     HPI: Patient is a 82 y.o. female seen in the office today for routine follow up. Felt it was need for a check up. Missed follow up appt.  At last visit norvasc was increase to 10 mg daily, reports she did this.  Daughter here at visit today but does not normally bring her.  Did not bring medication into visit.  Has tired to cut back on sodium intake.  Having palpitations, no pain noted.  No shortness of breath.   Chronic back pain, worse when she gets up. Takes tylenol with good effect however would like to see a back specialist to see if anything can be done to take the pain away. PT helped but she does not cont with the exercise.  Does not wish to go to orthopedic     Review of Systems:  Review of Systems  Constitutional: Negative for activity change, appetite change, fatigue and unexpected weight change.  HENT: Positive for tinnitus.        Long standing hx of tinnitus   Eyes: Negative.   Respiratory: Negative for cough and shortness of breath.   Cardiovascular: Positive for palpitations. Negative for chest pain and leg swelling.  Gastrointestinal: Negative for constipation and diarrhea.       GERD   Genitourinary: Negative for difficulty urinating.  Musculoskeletal: Positive for back pain.  Skin: Negative for  color change and wound.  Neurological: Positive for dizziness and light-headedness. Negative for weakness.  Psychiatric/Behavioral: Positive for confusion. Negative for agitation and behavioral problems. The patient is not nervous/anxious.        Anxiety and depression controlled on medication    Past Medical History:  Diagnosis Date  . Anemia   . Anxiety   . Arthritis   . Breast cancer ([place])   . Chronic kidney disease   . Dementia   . Depression   . Gastroesophageal reflux disease   . Heartburn   . Hypertension   . Hypothyroidism   . Leg cramps   . Post-menopause bleeding   . Restless leg syndrome 10/07/2014   Past Surgical History:  Procedure Laterality Date  . BREAST SURGERY Left    Lumpectomy   . CATARACT EXTRACTION, BILATERAL    . hysteroscopy biopsy     Social History:   reports that she has quit smoking. She has a 2.00 pack-year smoking history. She has never used smokeless tobacco. She reports that she does not drink alcohol or use drugs.  Family History  Problem Relation Age of Onset  . Hypertension Mother   . Arthritis Mother   . Heart disease Mother   . Hypertension Father   . Heart disease Father   . Cancer Brother   . Breast cancer Paternal Aunt     Medications: Patient's Medications  New Prescriptions  No medications on file  Previous Medications   ACETAMINOPHEN (TYLENOL) 325 MG TABLET    Take 650 mg by mouth as needed.   AMLODIPINE (NORVASC) 10 MG TABLET    Take 1 tablet (10 mg total) by mouth daily. Patient needs to schedule an appointment before anymore refills can be given   ASPIRIN EC 81 MG TABLET    Take 81 mg by mouth daily.   BUSPIRONE (BUSPAR) 7.5 MG TABLET    Take 1 tablet (7.5 mg total) by mouth 2 (two) times daily.   COD LIVER OIL CAPS    Take 1 capsule by mouth 2 (two) times daily.   DONEPEZIL (ARICEPT) 5 MG TABLET    Take 1 tablet (5 mg total) by mouth at bedtime.   GARLIC 10 MG CAPS    Take 1 capsule by mouth 2 (two) times daily.    LEVOTHYROXINE (SYNTHROID, LEVOTHROID) 100 MCG TABLET    Take 1 tablet (100 mcg total) by mouth daily before breakfast.   LOSARTAN (COZAAR) 50 MG TABLET    Take 50 mg by mouth daily.   MAGNESIUM ([facility]S TRIPLE MAGNESIUM COMPLEX) 400 MG CAPS    Take by mouth.   METOPROLOL SUCCINATE (TOPROL-XL) 50 MG 24 HR TABLET    TAKE 1 TABLET BY MOUTH DAILY WITH OR IMMEDIATELY FOLLOWING A MEAL   OMEPRAZOLE (PRILOSEC) 20 MG CAPSULE    TAKE ONE CAPSULE BY MOUTH DAILY   ROPINIROLE (REQUIP) 1 MG TABLET    Take 1 tablet (1 mg total) by mouth at bedtime.   TAMOXIFEN (NOLVADEX) 20 MG TABLET    Take 20 mg by mouth daily.   TIZANIDINE (ZANAFLEX) 4 MG TABLET    Take 1 tablet (4 mg total) by mouth daily.   TRAZODONE (DESYREL) 50 MG TABLET    Take 1 tablet (50 mg total) by mouth at bedtime.   VITAMIN A (CVS VITAMIN A) 10000 UNIT CAPSULE    Take by mouth.  Modified Medications   No medications on file  Discontinued Medications   No medications on file     Physical Exam:  Vitals:   04/15/16 1338 04/15/16 1357 04/15/16 1358  BP: (!) 148/74 (!) 142/70 (!) 148/74  Pulse: 64 62 74  Resp: 17    Temp: 98.7 F (37.1 C)    TempSrc: Oral    SpO2: 98%    Weight: 194 lb 6.4 oz (88.2 kg)    Height: '5\' 4"'$  (1.626 m)     Body mass index is 33.37 kg/m.  Physical Exam  Constitutional: She is oriented to person, place, and time. She appears well-developed and well-nourished. No distress.  HENT:  Head: Normocephalic and atraumatic.  Mouth/Throat: Oropharynx is clear and moist.  Eyes: Conjunctivae are normal. Pupils are equal, round, and reactive to light.  Cardiovascular: Normal rate, regular rhythm and normal heart sounds.   Pulmonary/Chest: Effort normal and breath sounds normal.  Abdominal: Soft. Bowel sounds are normal.  Musculoskeletal: She exhibits no edema or tenderness.  Neurological: She is alert and oriented to person, place, and time.  Skin: Skin is warm and dry. She is not diaphoretic.  Psychiatric: She has a  normal mood and affect.  Mild short term memory loss    Labs reviewed: Basic Metabolic Panel:  Recent Labs  08/10/15 1013 09/26/15 1518  NA 138 140  K 4.4 5.1  CL 104 109  CO2 24 24  GLUCOSE 97 100*  BUN 26* 16  CREATININE 1.86* 1.51*  CALCIUM 9.1 9.0  TSH 0.83  --    Liver Function Tests:  Recent Labs  08/10/15 1013  AST 21  ALT 14  ALKPHOS 49  BILITOT 0.2  PROT 7.2  ALBUMIN 3.7   No results for input(s): LIPASE, AMYLASE in the last 8760 hours. No results for input(s): AMMONIA in the last 8760 hours. CBC:  Recent Labs  08/10/15 1013  WBC 5.6  NEUTROABS 2,688  HGB 10.5*  HCT 32.1*  MCV 90.7  PLT 255   Lipid Panel:  Recent Labs  08/10/15 1013  CHOL 148  HDL 42*  LDLCALC 81  TRIG 125  CHOLHDL 3.5   TSH:  Recent Labs  08/10/15 1013  TSH 0.83   A1C: No results found for: HGBA1C   Assessment/Plan 1. Essential hypertension Blood pressure remains elevated however improved from previous, reports she is complaint with medication.  - EKG 12-Lead with noted to T wave abnormality, no previous EKG to compare, inverted t wave in lead 1,2, V3-6 -noted to have palpitations but no chest pains.  -referral to cardiology made  2. Late onset Alzheimer's disease without behavioral disturbance conts on aricept 5 mg daily. Will cont current regimen at this time.   3. Palpitation - Ambulatory referral to Cardiology - CMP with eGFR - CBC with Differential/Platelets - TSH  4. Malignant neoplasm of left breast in female, estrogen receptor positive, unspecified site of breast [facility]) Following with Dr Wendee Beavers oncologist, conts on tamoxifen.   5. Chronic midline low back pain without sciatica Would like a second opinion on her back pain, been to orthopedic due to pain and physical therapy. Tylenol is effective but wants something to take away the pain. Discussed recent xray of lumbar spine with pt and daughter. Would like referral. To cont tylenol PRN,  heat, and exercise.  - Ambulatory referral to Neurosurgery  Edyn Popoca K. Harle Battiest  [facility] [phone] 8 am - 5 pm) [phone] (after hours)

## 2016-04-15 NOTE — Patient Instructions (Signed)
Follow up in 3 months for routine follow up, sooner if needed

## 2016-04-16 DIAGNOSIS — Z961 Presence of intraocular lens: Secondary | ICD-10-CM | POA: Diagnosis not present

## 2016-04-16 DIAGNOSIS — H04123 Dry eye syndrome of bilateral lacrimal glands: Secondary | ICD-10-CM | POA: Diagnosis not present

## 2016-04-16 LAB — TSH: TSH: 0.54 mIU/L

## 2016-04-19 ENCOUNTER — Encounter: Payer: Self-pay | Admitting: Physician Assistant

## 2016-04-22 ENCOUNTER — Encounter (INDEPENDENT_AMBULATORY_CARE_PROVIDER_SITE_OTHER): Payer: Self-pay

## 2016-04-22 ENCOUNTER — Encounter: Payer: Self-pay | Admitting: Physician Assistant

## 2016-04-22 ENCOUNTER — Ambulatory Visit (INDEPENDENT_AMBULATORY_CARE_PROVIDER_SITE_OTHER): Payer: Medicare Other | Admitting: Physician Assistant

## 2016-04-22 VITALS — BP 112/58 | HR 67 | Ht 64.0 in | Wt 193.0 lb

## 2016-04-22 DIAGNOSIS — I10 Essential (primary) hypertension: Secondary | ICD-10-CM | POA: Diagnosis not present

## 2016-04-22 DIAGNOSIS — R0989 Other specified symptoms and signs involving the circulatory and respiratory systems: Secondary | ICD-10-CM | POA: Diagnosis not present

## 2016-04-22 DIAGNOSIS — R002 Palpitations: Secondary | ICD-10-CM | POA: Diagnosis not present

## 2016-04-22 NOTE — Progress Notes (Signed)
Cardiology Office Note    Date:  04/22/2016   ID:  Regina Porter, DOB 07-18-1934, MRN [id number]  PCP:  [name], NP  Cardiologist: new to establish with Dr. Irish Lack  Chief Complaint  Patient presents with  . New Patient (Initial Visit)    History of Present Illness:  Regina Porter is a 82 y.o. female with history of hypertension, anxiety depression, breast CA, chronic back pain, Alzheimer's, referred to [place] by primary care for evaluation of palpitations. Labs 04/15/16 creatinine 1.83 potassium 4.4 hemoglobin 10.8, TSH normal at 0.54. EKG not in system but reports those T-wave abnormality with inversion in 1, 2, V3 through V6. No prior EKGs to compare to.  Patient comes in today accompanied by her daughter. She has had palpitations for many years that usually occur once every 3 or 4 months. She had an upper respiratory infection and was taken Tylenol Sinus with a decongestant and developed 2 weeks of fast irregular heartbeats that were much harder than usual. She had associated dizziness with it. Since then it has lessened and she only had one episode that occurred last night and lasted less than 1 minute. She is not very active because of chronic back pain. She denies any chest pain, tightness, dyspnea, dyspnea on exertion or syncope. She has no edema. She quit smoking 6 years ago but has 30-pack-year history of cigarettes.     Past Medical History:  Diagnosis Date  . Anemia   . Anxiety   . Arthritis   . Breast cancer ([place])   . Chronic kidney disease   . Dementia   . Depression   . Gastroesophageal reflux disease   . Heartburn   . Hypertension   . Hypothyroidism   . Leg cramps   . Post-menopause bleeding   . Restless leg syndrome 10/07/2014    Past Surgical History:  Procedure Laterality Date  . BREAST SURGERY Left    Lumpectomy   . CATARACT EXTRACTION, BILATERAL    . hysteroscopy biopsy      Current Medications: Outpatient Medications Prior to Visit  Medication Sig  Dispense Refill  . acetaminophen (TYLENOL) 325 MG tablet Take 650 mg by mouth as needed.    Marland Kitchen aspirin EC 81 MG tablet Take 81 mg by mouth daily.    . busPIRone (BUSPAR) 7.5 MG tablet Take 1 tablet (7.5 mg total) by mouth 2 (two) times daily. 180 tablet 1  . Cod Liver Oil CAPS Take 1 capsule by mouth 2 (two) times daily.    . Garlic 10 MG CAPS Take 1 capsule by mouth 2 (two) times daily.    Marland Kitchen levothyroxine (SYNTHROID, LEVOTHROID) 100 MCG tablet Take 1 tablet (100 mcg total) by mouth daily before breakfast. 90 tablet 1  . losartan (COZAAR) 50 MG tablet Take 50 mg by mouth daily.    . Magnesium (CVS TRIPLE MAGNESIUM COMPLEX) 400 MG CAPS Take by mouth.    . metoprolol succinate (TOPROL-XL) 50 MG 24 hr tablet TAKE 1 TABLET BY MOUTH DAILY WITH OR IMMEDIATELY FOLLOWING A MEAL 90 tablet 1  . omeprazole (PRILOSEC) 20 MG capsule TAKE ONE CAPSULE BY MOUTH DAILY 90 capsule 1  . rOPINIRole (REQUIP) 1 MG tablet Take 1 tablet (1 mg total) by mouth at bedtime. 90 tablet 1  . tamoxifen (NOLVADEX) 20 MG tablet Take 20 mg by mouth daily.    Marland Kitchen tiZANidine (ZANAFLEX) 4 MG tablet Take 1 tablet (4 mg total) by mouth daily. 90 tablet 1  . traZODone (  DESYREL) 50 MG tablet Take 1 tablet (50 mg total) by mouth at bedtime. 30 tablet 0  . vitamin A (CVS VITAMIN A) 10000 UNIT capsule Take by mouth.    Marland Kitchen amLODipine (NORVASC) 10 MG tablet Take 1 tablet (10 mg total) by mouth daily. Patient needs to schedule an appointment before anymore refills can be given (Patient not taking: Reported on 04/22/2016) 30 tablet 0  . donepezil (ARICEPT) 5 MG tablet Take 1 tablet (5 mg total) by mouth at bedtime. (Patient not taking: Reported on 04/22/2016) 90 tablet 3   No facility-administered medications prior to visit.      Allergies:   Fish-derived products and Neomy-bacit-polymyx-pramoxine   Social History   Social History  . Marital status: Widowed    Spouse name: N/A  . Number of children: 5  . Years of education: 14   Occupational  History  . Retired    Social History Main Topics  . Smoking status: Former Smoker    Packs/day: 0.10    Years: 20.00  . Smokeless tobacco: Never Used  . Alcohol use No  . Drug use: No  . Sexual activity: No   Other Topics Concern  . None   Social History Narrative   Diet?       Do you drink/eat things with caffeine? yes      Marital status?                widow                    What year were you married?      Do you live in a house, apartment, assisted living, condo, trailer, etc.? house      Is it one or more stories? yes      How many persons live in your home? 2      Do you have any pets in your home? (please list) no      Current or past profession:      Do you exercise?         no                             Type & how often? none      Do you have a living will? no   Do you have a DNR form?  no                                If not, do you want to discuss one? no      Do you have signed POA/HPOA for forms?         Family History:  The patient's family history includes Arthritis in her mother; Breast cancer in her paternal aunt; Cancer in her brother; Heart disease in her father and mother; Hypertension in her father and mother.   ROS:   Please see the history of present illness.    Review of Systems  Constitution: Negative.  HENT: Negative.   Eyes: Negative.   Cardiovascular: Positive for palpitations.  Respiratory: Negative.   Hematologic/Lymphatic: Negative.   Musculoskeletal: Positive for back pain and myalgias. Negative for joint pain.  Gastrointestinal: Negative.   Genitourinary: Negative.   Neurological: Positive for dizziness.   All other systems reviewed and are negative.   PHYSICAL EXAM:   VS:  BP (!) 112/58   Pulse 67  Ht 5\' 4"  (1.626 m)   Wt 193 lb (87.5 kg)   SpO2 97%   BMI 33.13 kg/m   Physical Exam  GEN: Well nourished, well developed, in no acute distress  Neck:Left carotid and subclavian bruit no JVD,  or  masses Cardiac:RRR; no murmurs, rubs, or gallops  Respiratory:  clear to auscultation bilaterally, normal work of breathing GI: soft, nontender, nondistended, + BS Ext: without cyanosis, clubbing, or edema, Good distal pulses bilaterally Neuro:  Alert and Oriented x 3,  Psych: euthymic mood, full affect  Wt Readings from Last 3 Encounters:  04/22/16 193 lb (87.5 kg)  04/15/16 194 lb 6.4 oz (88.2 kg)  11/13/15 195 lb (88.5 kg)      Studies/Labs Reviewed:   EKG:  EKG is  ordered today.  The ekg ordered today demonstrates  Normal sinus rhythm at 64 bpm with first-degree AV block nonspecific ST-T wave changes  Recent Labs: 04/15/2016: ALT 15; BUN 22; Creat 1.83; Hemoglobin 10.8; Platelets 261; Potassium 4.4; Sodium 139; TSH 0.54   Lipid Panel    Component Value Date/Time   CHOL 148 08/10/2015 1013   TRIG 125 08/10/2015 1013   HDL 42 (L) 08/10/2015 1013   CHOLHDL 3.5 08/10/2015 1013   VLDL 25 08/10/2015 1013   LDLCALC 81 08/10/2015 1013    Additional studies/ records that were reviewed today include:  Labs reviewed above    ASSESSMENT:    1. Palpitations   2. Left carotid bruit   3. Essential hypertension      PLAN:  In order of problems listed above:  Palpitations for many years but became more frequent and harder with an upper respiratory infection and taking Tylenol Sinus with a decongestant. EKG without acute change. TSH normal. Discussed with Dr. Caryl Comes who concurs the patient should have about monitor to rule out atrial fibrillation or other arrhythmia. No further use of decongestants. Decrease caffeine intake. Also check 2-D echo to evaluate structural heart. F/U with Dr. Irish Lack to become established.  Left carotid bruit check Dopplers  Essential hypertension well controlled    Medication Adjustments/Labs and Tests Ordered: Current medicines are reviewed at length with the patient today.  Concerns regarding medicines are outlined above.  Medication changes,  Labs and Tests ordered today are listed in the Patient Instructions below. Patient Instructions  Medication Instructions:  None Ordered   Labwork: None Ordered   Testing/Procedures: Your physician has recommended that you wear an event monitor. Event monitors are medical devices that record the heart's electrical activity. Doctors most often [place] these monitors to diagnose arrhythmias. Arrhythmias are problems with the speed or rhythm of the heartbeat. The monitor is a small, portable device. You can wear one while you do your normal daily activities. This is usually used to diagnose what is causing palpitations/syncope (passing out).  Your physician has requested that you have an echocardiogram. Echocardiography is a painless test that uses sound waves to create images of your heart. It provides your doctor with information about the size and shape of your heart and how well your heart's chambers and valves are working. This procedure takes approximately one hour. There are no restrictions for this procedure.  Your physician has requested that you have a carotid duplex. This test is an ultrasound of the carotid arteries in your neck. It looks at blood flow through these arteries that supply the brain with blood. Allow one hour for this exam. There are no restrictions or special instructions.  Follow-Up: Your physician recommends that you schedule a follow-up appointment in: 1-2 months with Dr. Irish Lack  Any Other Special Instructions Will Be Listed Below (If Applicable).     If you need a refill on your cardiac medications before your next appointment, please call your pharmacy.      Signed, Ermalinda Barrios, PA-C  04/22/2016 1:56 PM    [facility] [place], [place], [place]  [address] Phone: [phone]; Fax: [phone]

## 2016-04-22 NOTE — Patient Instructions (Signed)
Medication Instructions:  None Ordered   Labwork: None Ordered   Testing/Procedures: Your physician has recommended that you wear an event monitor. Event monitors are medical devices that record the heart's electrical activity. Doctors most often [place] these monitors to diagnose arrhythmias. Arrhythmias are problems with the speed or rhythm of the heartbeat. The monitor is a small, portable device. You can wear one while you do your normal daily activities. This is usually used to diagnose what is causing palpitations/syncope (passing out).  Your physician has requested that you have an echocardiogram. Echocardiography is a painless test that uses sound waves to create images of your heart. It provides your doctor with information about the size and shape of your heart and how well your heart's chambers and valves are working. This procedure takes approximately one hour. There are no restrictions for this procedure.  Your physician has requested that you have a carotid duplex. This test is an ultrasound of the carotid arteries in your neck. It looks at blood flow through these arteries that supply the brain with blood. Allow one hour for this exam. There are no restrictions or special instructions.        Follow-Up: Your physician recommends that you schedule a follow-up appointment in: 1-2 months with Dr. Irish Lack  Any Other Special Instructions Will Be Listed Below (If Applicable).     If you need a refill on your cardiac medications before your next appointment, please call your pharmacy.

## 2016-04-23 NOTE — Addendum Note (Signed)
Addended by: Della Goo C on: 04/23/2016 01:37 PM   Modules accepted: Orders

## 2016-05-05 ENCOUNTER — Other Ambulatory Visit: Payer: Self-pay | Admitting: Nurse Practitioner

## 2016-05-07 ENCOUNTER — Ambulatory Visit (HOSPITAL_COMMUNITY): Payer: Medicare Other | Attending: Cardiovascular Disease

## 2016-05-07 ENCOUNTER — Other Ambulatory Visit: Payer: Self-pay

## 2016-05-07 ENCOUNTER — Ambulatory Visit (INDEPENDENT_AMBULATORY_CARE_PROVIDER_SITE_OTHER): Payer: Medicare Other

## 2016-05-07 ENCOUNTER — Other Ambulatory Visit: Payer: Self-pay | Admitting: Nurse Practitioner

## 2016-05-07 DIAGNOSIS — R002 Palpitations: Secondary | ICD-10-CM | POA: Diagnosis not present

## 2016-05-07 DIAGNOSIS — I10 Essential (primary) hypertension: Secondary | ICD-10-CM

## 2016-05-07 DIAGNOSIS — I340 Nonrheumatic mitral (valve) insufficiency: Secondary | ICD-10-CM | POA: Insufficient documentation

## 2016-05-10 ENCOUNTER — Ambulatory Visit (HOSPITAL_COMMUNITY)
Admission: RE | Admit: 2016-05-10 | Discharge: 2016-05-10 | Disposition: A | Discharge status: Home / Self Care | Payer: Medicare Other | Source: Ambulatory Visit | Attending: Cardiovascular Disease | Admitting: Cardiovascular Disease

## 2016-05-10 DIAGNOSIS — I6523 Occlusion and stenosis of bilateral carotid arteries: Secondary | ICD-10-CM | POA: Insufficient documentation

## 2016-05-10 DIAGNOSIS — R0989 Other specified symptoms and signs involving the circulatory and respiratory systems: Secondary | ICD-10-CM | POA: Diagnosis not present

## 2016-05-13 ENCOUNTER — Telehealth: Payer: Self-pay | Admitting: Physician Assistant

## 2016-05-13 DIAGNOSIS — I10 Essential (primary) hypertension: Secondary | ICD-10-CM | POA: Diagnosis not present

## 2016-05-13 DIAGNOSIS — Z6832 Body mass index (BMI) 32.0-32.9, adult: Secondary | ICD-10-CM | POA: Diagnosis not present

## 2016-05-13 DIAGNOSIS — M549 Dorsalgia, unspecified: Secondary | ICD-10-CM | POA: Diagnosis not present

## 2016-05-13 NOTE — Telephone Encounter (Signed)
Returned pts call and discussed her VAS US Carotid results. Pt verbalized appreciation for the call.

## 2016-05-13 NOTE — Telephone Encounter (Signed)
New Message ° °Pt voiced returning nurses call. °

## 2016-05-13 NOTE — Telephone Encounter (Signed)
-----   Message from Imogene Burn, PA-C sent at 05/13/2016  7:58 AM EDT ----- 1-39% bilateral carotid stenosis. Stable. Repeat in 1 yr

## 2016-05-14 ENCOUNTER — Encounter: Payer: Self-pay | Admitting: Interventional Cardiology

## 2016-05-15 ENCOUNTER — Other Ambulatory Visit: Payer: Self-pay | Admitting: Nurse Practitioner

## 2016-05-15 DIAGNOSIS — I10 Essential (primary) hypertension: Secondary | ICD-10-CM

## 2016-05-27 ENCOUNTER — Encounter (HOSPITAL_COMMUNITY): Payer: Self-pay | Admitting: Emergency Medicine

## 2016-05-27 ENCOUNTER — Emergency Department (HOSPITAL_COMMUNITY)
Admission: EM | Admit: 2016-05-27 | Discharge: 2016-05-28 | Disposition: A | Discharge status: Home / Self Care | Payer: Medicare Other | Attending: Emergency Medicine | Admitting: Emergency Medicine

## 2016-05-27 ENCOUNTER — Emergency Department (HOSPITAL_COMMUNITY): Payer: Medicare Other

## 2016-05-27 DIAGNOSIS — Z7982 Long term (current) use of aspirin: Secondary | ICD-10-CM | POA: Insufficient documentation

## 2016-05-27 DIAGNOSIS — E039 Hypothyroidism, unspecified: Secondary | ICD-10-CM | POA: Insufficient documentation

## 2016-05-27 DIAGNOSIS — Z87891 Personal history of nicotine dependence: Secondary | ICD-10-CM | POA: Diagnosis not present

## 2016-05-27 DIAGNOSIS — R42 Dizziness and giddiness: Secondary | ICD-10-CM | POA: Diagnosis not present

## 2016-05-27 DIAGNOSIS — I129 Hypertensive chronic kidney disease with stage 1 through stage 4 chronic kidney disease, or unspecified chronic kidney disease: Secondary | ICD-10-CM | POA: Diagnosis not present

## 2016-05-27 DIAGNOSIS — N189 Chronic kidney disease, unspecified: Secondary | ICD-10-CM | POA: Insufficient documentation

## 2016-05-27 DIAGNOSIS — R9431 Abnormal electrocardiogram [ECG] [EKG]: Secondary | ICD-10-CM | POA: Diagnosis not present

## 2016-05-27 DIAGNOSIS — Z853 Personal history of malignant neoplasm of breast: Secondary | ICD-10-CM | POA: Diagnosis not present

## 2016-05-27 LAB — COMPREHENSIVE METABOLIC PANEL
ALT: 24 U/L (ref 14–54)
AST: 31 U/L (ref 15–41)
Albumin: 3.9 g/dL (ref 3.5–5.0)
Alkaline Phosphatase: 51 U/L (ref 38–126)
Anion gap: 7 (ref 5–15)
BUN: 11 mg/dL (ref 6–20)
CO2: 23 mmol/L (ref 22–32)
Calcium: 9.2 mg/dL (ref 8.9–10.3)
Chloride: 108 mmol/L (ref 101–111)
Creatinine, Ser: 1.57 mg/dL — ABNORMAL HIGH (ref 0.44–1.00)
GFR calc Af Amer: 35 mL/min — ABNORMAL LOW (ref >60)
GFR calc non Af Amer: 30 mL/min — ABNORMAL LOW (ref >60)
Glucose, Bld: 110 mg/dL — ABNORMAL HIGH (ref 65–99)
Potassium: 4.1 mmol/L (ref 3.5–5.1)
Sodium: 138 mmol/L (ref 135–145)
Total Bilirubin: 0.3 mg/dL (ref 0.3–1.2)
Total Protein: 7.4 g/dL (ref 6.5–8.1)

## 2016-05-27 LAB — I-STAT CHEM 8, ED
BUN: 12 mg/dL (ref 6–20)
Calcium, Ion: 1.21 mmol/L (ref 1.15–1.40)
Chloride: 107 mmol/L (ref 101–111)
Creatinine, Ser: 1.6 mg/dL — ABNORMAL HIGH (ref 0.44–1.00)
Glucose, Bld: 110 mg/dL — ABNORMAL HIGH (ref 65–99)
HCT: 36 % (ref 36.0–46.0)
Hemoglobin: 12.2 g/dL (ref 12.0–15.0)
Potassium: 4.2 mmol/L (ref 3.5–5.1)
Sodium: 142 mmol/L (ref 135–145)
TCO2: 25 mmol/L (ref 0–100)

## 2016-05-27 LAB — CBC
HCT: 36.3 % (ref 36.0–46.0)
Hemoglobin: 12.2 g/dL (ref 12.0–15.0)
MCH: 31 pg (ref 26.0–34.0)
MCHC: 33.6 g/dL (ref 30.0–36.0)
MCV: 92.1 fL (ref 78.0–100.0)
Platelets: 236 10*3/uL (ref 150–400)
RBC: 3.94 MIL/uL (ref 3.87–5.11)
RDW: 14 % (ref 11.5–15.5)
WBC: 4.6 10*3/uL (ref 4.0–10.5)

## 2016-05-27 LAB — URINALYSIS, ROUTINE W REFLEX MICROSCOPIC
Bilirubin Urine: NEGATIVE
Glucose, UA: NEGATIVE mg/dL
Hgb urine dipstick: NEGATIVE
Ketones, ur: NEGATIVE mg/dL
Leukocytes, UA: NEGATIVE
Nitrite: NEGATIVE
Protein, ur: 100 mg/dL — AB
Specific Gravity, Urine: 1.014 (ref 1.005–1.030)
pH: 6 (ref 5.0–8.0)

## 2016-05-27 LAB — DIFFERENTIAL
Basophils Absolute: 0 10*3/uL (ref 0.0–0.1)
Basophils Relative: 0 %
Eosinophils Absolute: 0.1 10*3/uL (ref 0.0–0.7)
Eosinophils Relative: 1 %
Lymphocytes Relative: 41 %
Lymphs Abs: 1.9 10*3/uL (ref 0.7–4.0)
Monocytes Absolute: 0.3 10*3/uL (ref 0.1–1.0)
Monocytes Relative: 6 %
Neutro Abs: 2.4 10*3/uL (ref 1.7–7.7)
Neutrophils Relative %: 52 %

## 2016-05-27 LAB — APTT: aPTT: 31 seconds (ref 24–36)

## 2016-05-27 LAB — I-STAT TROPONIN, ED: Troponin i, poc: 0 ng/mL (ref 0.00–0.08)

## 2016-05-27 LAB — PROTIME-INR
INR: 1.04
Prothrombin Time: 13.6 seconds (ref 11.4–15.2)

## 2016-05-27 NOTE — ED Triage Notes (Signed)
Pt presents to ED for assessment dizziness since this morning at 2am, intermittent in nature.  No other neuro dificts noted.  Denies headache, denies werakness, denies numbness.  Hx of vertigo, states it feels similar.

## 2016-05-28 LAB — CBG MONITORING, ED: Glucose-Capillary: 80 mg/dL (ref 65–99)

## 2016-05-28 MED ORDER — MECLIZINE HCL 25 MG PO TABS
25.0000 mg | ORAL_TABLET | Freq: Once | ORAL | Status: AC
  Administered 2016-05-28: 25 mg via ORAL
  Filled 2016-05-28: qty 1

## 2016-05-28 MED ORDER — MECLIZINE HCL 25 MG PO TABS: 25.0000 mg | ORAL_TABLET | Freq: Three times a day (TID) | ORAL | 0 refills | Status: DC | PRN

## 2016-05-28 NOTE — Discharge Instructions (Signed)

## 2016-05-28 NOTE — ED Provider Notes (Signed)
[facility] DEPT Provider Note   CSN: [id number] Arrival date & time: 05/27/16  1825 By signing my name below, I, Georgette Shell, attest that this documentation has been prepared under the direction and in the presence of Ripley Fraise, MD. Electronically Signed: Georgette Shell, ED Scribe. 05/28/16. 12:22 AM.  History   Chief Complaint Chief Complaint  Patient presents with  . Dizziness    HPI The history is provided by the patient. No language interpreter was used.   HPI Comments: Regina Porter is a 82 y.o. female with h/o anemia, breast cancer, dementia, GERD, CKD, and HTN, who presents to the Emergency Department complaining of sudden onset, intermittent dizziness beginning yesterday ~2am. Pt also has associated generalized weakness. She had just showered at the time of onset. Pt reports that her initial episode lasted less than five minutes, noting she heard "noises" in her head. She also had tinnitus at the initial onset but notes it has since resolved. States the episodes following the first episode were more mild. Pt reports that her symptoms seem to worsen with movement. Has h/o vertigo and notes this feels similar. No h/o MI or CVA. Pt denies fever, chills, headache, neck pain, vomiting, visual disturbances, chest pain, abdominal pain, focal numbness or weakness, difficulty speaking, hearing loss, or any other associated symptoms.   Per chart review, pt is followed by a cardiologist for heart palpitations and has a Holter monitor in place at this time.  PCP: [name], NP Cardiologist: Murrell Converse  Past Medical History:  Diagnosis Date  . Anemia   . Anxiety   . Arthritis   . Breast cancer ([place])   . Chronic kidney disease   . Dementia   . Depression   . Gastroesophageal reflux disease   . Heartburn   . Hypertension   . Hypothyroidism   . Leg cramps   . Post-menopause bleeding   . Restless leg syndrome 10/07/2014    Patient Active Problem List   Diagnosis Date Noted  . Palpitations 04/22/2016  . Left carotid bruit 04/22/2016  . Hypothyroidism   . Hypertension   . Heartburn   . Depression   . Dementia   . Breast cancer ([place])   . Muscle spasm of back 12/19/2014  . Urinary frequency 12/19/2014    Past Surgical History:  Procedure Laterality Date  . BREAST SURGERY Left    Lumpectomy   . CATARACT EXTRACTION, BILATERAL    . hysteroscopy biopsy      OB History    No data available       Home Medications    Prior to Admission medications   Medication Sig Start Date End Date Taking? Authorizing Provider  acetaminophen (TYLENOL) 325 MG tablet Take 650 mg by mouth as needed.    [provider]  amLODipine (NORVASC) 10 MG tablet Take 1 tablet by mouth every day. 05/16/16   [name], NP  aspirin EC 81 MG tablet Take 81 mg by mouth daily.    [provider]  busPIRone (BUSPAR) 7.5 MG tablet Take 1 tablet (7.5 mg total) by mouth 2 (two) times daily. 11/13/15   [name], NP  Cod Liver Oil CAPS Take 1 capsule by mouth 2 (two) times daily.    [provider]  donepezil (ARICEPT) 10 MG tablet Take 10 mg by mouth at bedtime.    [provider]  donepezil (ARICEPT) 5 MG tablet Take 1 tablet (5 mg total) by mouth at bedtime. Patient  not taking: Reported on 04/22/2016 11/09/15   Reed, Tiffany L, DO  Garlic 10 MG CAPS Take 1 capsule by mouth 2 (two) times daily.    [provider]  levothyroxine (SYNTHROID, LEVOTHROID) 100 MCG tablet TAKE 1 TABLET (100 MCG TOTAL) BY MOUTH DAILY BEFORE BREAKFAST. 05/06/16   [name], NP  losartan (COZAAR) 50 MG tablet Take 50 mg by mouth daily.    [provider]  Magnesium ([employer] TRIPLE MAGNESIUM COMPLEX) 400 MG CAPS Take by mouth.    [provider]  metoprolol succinate (TOPROL-XL) 50 MG 24 hr tablet TAKE 1 TABLET BY MOUTH DAILY WITH OR IMMEDIATELY FOLLOWING A MEAL 03/07/16   [name], NP  omeprazole  (PRILOSEC) 20 MG capsule TAKE ONE CAPSULE BY MOUTH DAILY 03/07/16   [name], NP  rOPINIRole (REQUIP) 1 MG tablet Take 1 tablet (1 mg total) by mouth at bedtime. 11/13/15   [name], NP  tamoxifen (NOLVADEX) 20 MG tablet Take 20 mg by mouth daily.    [provider]  tiZANidine (ZANAFLEX) 4 MG tablet Take 1 tablet (4 mg total) by mouth daily. 11/13/15   [name], NP  traZODone (DESYREL) 50 MG tablet Take 1 tablet (50 mg total) by mouth at bedtime. 01/25/16   [name], NP  vitamin A ([employer]S VITAMIN A) 10000 UNIT capsule Take by mouth.    [provider]    Family History Family History  Problem Relation Age of Onset  . Hypertension Mother   . Arthritis Mother   . Heart disease Mother   . Hypertension Father   . Heart disease Father   . Cancer Brother   . Breast cancer Paternal Aunt     Social History Social History  Substance Use Topics  . Smoking status: Former Smoker    Packs/day: 0.10    Years: 20.00  . Smokeless tobacco: Never Used  . Alcohol use No     Allergies   Fish-derived products and Neomy-bacit-polymyx-pramoxine   Review of Systems Review of Systems  Constitutional: Negative for chills and fever.  HENT: Positive for tinnitus. Negative for hearing loss.   Eyes: Negative for visual disturbance.  Cardiovascular: Negative for chest pain.  Gastrointestinal: Negative for abdominal pain and vomiting.  Musculoskeletal: Negative for neck pain.  Neurological: Positive for dizziness and weakness. Negative for speech difficulty and headaches.  All other systems reviewed and are negative.    Physical Exam Updated Vital Signs BP (!) 173/45 (BP Location: Right Arm)   Pulse 70   Temp 98.1 F (36.7 C)   Resp 17   Ht 5\' 5"  (1.651 m)   Wt 190 lb (86.2 kg)   SpO2 99%   BMI 31.62 kg/m  Patient wearing holter monitor Physical Exam CONSTITUTIONAL: Elderly, no acute distress HEAD: Normocephalic/atraumatic EYES:  EOMI/PERRL, no nystagmus,no ptosis ENMT: Mucous membranes moist, bilateral TM clear/intact NECK: supple no meningeal signs, no loud bruits CV: S1/S2 noted, no harsh murmurs noted LUNGS: Lungs are clear to auscultation bilaterally, no apparent distress ABDOMEN: soft, nontender, no rebound or guarding GU:no cva tenderness NEURO:Awake/alert, face symmetric, no arm or leg drift is noted Equal 5/5 strength with shoulder abduction, elbow flex/extension Equal 5/5 strength with hip flexion,knee flex/extension, foot dorsi/plantar flexion Cranial nerves 3/4/5/6/07/22/08/11/12 tested and intact No past pointing Sensation to light touch intact in all extremities EXTREMITIES: pulses normal, full ROM SKIN: warm, color normal PSYCH: no abnormalities of mood noted   ED Treatments / Results  DIAGNOSTIC STUDIES: Oxygen Saturation is 99% on RA, normal by my interpretation.   COORDINATION OF CARE: 12:22 AM-Discussed next steps with pt. Pt verbalized understanding and is agreeable with the plan.   Labs (all labs ordered are listed, but only abnormal results are displayed) Labs Reviewed  URINALYSIS, ROUTINE W REFLEX MICROSCOPIC - Abnormal; Notable for the following:       Result Value   APPearance HAZY (*)    Protein, ur 100 (*)    Bacteria, UA RARE (*)    Squamous Epithelial / LPF 0-5 (*)    All other components within normal limits  COMPREHENSIVE METABOLIC PANEL - Abnormal; Notable for the following:    Glucose, Bld 110 (*)    Creatinine, Ser 1.57 (*)    GFR calc non Af Amer 30 (*)    GFR calc Af Amer 35 (*)    All other components within normal limits  I-STAT CHEM 8, ED - Abnormal; Notable for the following:    Creatinine, Ser 1.60 (*)    Glucose, Bld 110 (*)    All other components within normal limits  CBC  PROTIME-INR  APTT  DIFFERENTIAL  I-STAT TROPOININ, ED  CBG MONITORING, ED    EKG  EKG Interpretation  Date/Time:  Monday May 27 2016 19:29:21 EDT Ventricular Rate:  73 PR  Interval:  242 QRS Duration: 76 QT Interval:  392 QTC Calculation: 431 R Axis:   53 Text Interpretation:  Sinus rhythm with 1st degree A-V block Nonspecific T wave abnormality Abnormal ECG No previous ECGs available Confirmed by Ripley Fraise [phone]) on 05/28/2016 12:11:09 AM       Radiology Ct Head Wo Contrast  Result Date: 05/27/2016 CLINICAL DATA:  Pt presents to ED for assessment dizziness since this morning at 2am, intermittent in nature. No other neuro deficits noted. Denies headache, denies weakness, denies numbness. Hx of vertigo, states it feels similar. EXAM: CT HEAD WITHOUT CONTRAST TECHNIQUE: Contiguous axial images were obtained from the base of the skull through the vertex without intravenous contrast. COMPARISON:  None. FINDINGS: Brain: There is mild central and cortical atrophy. There is no intra or extra-axial fluid collection or mass lesion. The basilar cisterns and ventricles have a normal appearance. There is no CT evidence for acute infarction or hemorrhage. Vascular: There is atherosclerotic calcification of the carotid siphons. Skull: Normal. Negative for fracture or focal lesion. Sinuses/Orbits: No acute finding. Other: None IMPRESSION: 1. Mild atrophy. 2.  No evidence for acute intracranial abnormality. Electronically Signed   By: Nolon Nations M.D.   On: 05/27/2016 21:51    Procedures Procedures    Medications Ordered in ED Medications  meclizine (ANTIVERT) tablet 25 mg (25 mg Oral Given 05/28/16 0104)     Initial Impression / Assessment and Plan / ED Course  I have reviewed the triage vital signs and the nursing notes.  Pertinent labs  results that were available during my care of the patient were reviewed by me and considered in my medical decision making (see chart for details).     12:38 AM Pt stable, appears c/w vertigo tPA in stroke considered but not given due to: Onset over 3-4.5hours EKG on this visit, urgent care visit and also previous  cardiology visit reviewed and unchanged Urgent care notes reviewed Will give antivert and reassess   4:08 AM Pt improved She is awake/alert, no focal weakness She is ambulatory with her cane She reports feeling well Pt already seeing cardiology for recent palpitations and has holter  monitor in place.  For tonight's episode, I don't feel this is cardiac in nature Suspect peripheral vertigo My suspicion for stroke is low given history/exam  Will start antivert Precautions discussed concerning this med with patient/daughter  We discussed strict ER return precautions and need to call [phone] if any new weakness or difficulty walking  Final Clinical Impressions(s) / ED Diagnoses   Final diagnoses:  Vertigo    New Prescriptions New Prescriptions   MECLIZINE (ANTIVERT) 25 MG TABLET    Take 1 tablet (25 mg total) by mouth 3 (three) times daily as needed for dizziness.   I personally performed the services described in this documentation, which was scribed in my presence. The recorded information has been reviewed and is accurate.        Ripley Fraise, MD 05/28/16 [phone]

## 2016-05-28 NOTE — ED Notes (Signed)
Pt ambulated to the bathroom with her cane. Pt had not problems and reports that is her normal gait.

## 2016-05-28 NOTE — ED Notes (Signed)
Pt complains of some dizziness that started 22 hours ago. Pt states that she has a history of vertigo but does not take medicines for same.

## 2016-05-31 ENCOUNTER — Ambulatory Visit: Payer: Medicare Other | Admitting: Interventional Cardiology

## 2016-06-03 ENCOUNTER — Telehealth: Payer: Self-pay | Admitting: *Deleted

## 2016-06-03 NOTE — Telephone Encounter (Signed)
This is OTC, please make a follow up when able

## 2016-06-03 NOTE — Telephone Encounter (Signed)
I spoke with patient to let her know that she could get this medication OTC and she stated that she wanted a prescription so that she did not have to pay for it OTC. As I was asking patient to schedule a follow up appointment, she hung up the phone.

## 2016-06-03 NOTE — Telephone Encounter (Signed)
Patient called and requested a refill on a medication, Meclizine, that the ER gave her last Monday. Patient stated that she has inner ear and needs the medication.  I tried to schedule patient an appointment to follow up from the ER visit but patient refused and stated that she couldn't get in here. Wants a Rx called to pharmacy.  Please Advise.

## 2016-06-12 ENCOUNTER — Other Ambulatory Visit: Payer: Self-pay | Admitting: Neurosurgery

## 2016-06-12 DIAGNOSIS — M549 Dorsalgia, unspecified: Secondary | ICD-10-CM

## 2016-06-14 ENCOUNTER — Other Ambulatory Visit: Payer: Self-pay | Admitting: Nurse Practitioner

## 2016-06-14 DIAGNOSIS — I10 Essential (primary) hypertension: Secondary | ICD-10-CM

## 2016-06-18 DIAGNOSIS — R928 Other abnormal and inconclusive findings on diagnostic imaging of breast: Secondary | ICD-10-CM | POA: Diagnosis not present

## 2016-06-18 LAB — HM MAMMOGRAPHY

## 2016-06-19 ENCOUNTER — Encounter: Payer: Self-pay | Admitting: *Deleted

## 2016-06-20 ENCOUNTER — Other Ambulatory Visit: Payer: Self-pay | Admitting: Nurse Practitioner

## 2016-06-21 ENCOUNTER — Other Ambulatory Visit: Payer: Self-pay

## 2016-06-21 DIAGNOSIS — I10 Essential (primary) hypertension: Secondary | ICD-10-CM

## 2016-06-21 MED ORDER — AMLODIPINE BESYLATE 10 MG PO TABS: ORAL_TABLET | ORAL | 5 refills | Status: DC

## 2016-06-25 ENCOUNTER — Ambulatory Visit
Admission: RE | Admit: 2016-06-25 | Discharge: 2016-06-25 | Disposition: A | Discharge status: Home / Self Care | Payer: Medicare Other | Source: Ambulatory Visit | Attending: Neurosurgery | Admitting: Neurosurgery

## 2016-06-25 DIAGNOSIS — M549 Dorsalgia, unspecified: Secondary | ICD-10-CM

## 2016-06-25 DIAGNOSIS — M48061 Spinal stenosis, lumbar region without neurogenic claudication: Secondary | ICD-10-CM | POA: Diagnosis not present

## 2016-07-02 ENCOUNTER — Other Ambulatory Visit: Payer: Self-pay | Admitting: Nurse Practitioner

## 2016-07-02 DIAGNOSIS — I10 Essential (primary) hypertension: Secondary | ICD-10-CM

## 2016-07-03 ENCOUNTER — Other Ambulatory Visit: Payer: Self-pay | Admitting: *Deleted

## 2016-07-03 MED ORDER — LOSARTAN POTASSIUM 50 MG PO TABS: 50.0000 mg | ORAL_TABLET | Freq: Every day | ORAL | 3 refills | Status: DC

## 2016-07-03 NOTE — Telephone Encounter (Signed)
Patient requested refill Faxed to pharmacy.  

## 2016-07-24 DIAGNOSIS — M48062 Spinal stenosis, lumbar region with neurogenic claudication: Secondary | ICD-10-CM | POA: Diagnosis not present

## 2016-07-24 DIAGNOSIS — M4316 Spondylolisthesis, lumbar region: Secondary | ICD-10-CM | POA: Diagnosis not present

## 2016-07-24 DIAGNOSIS — Z6833 Body mass index (BMI) 33.0-33.9, adult: Secondary | ICD-10-CM | POA: Diagnosis not present

## 2016-07-24 DIAGNOSIS — R030 Elevated blood-pressure reading, without diagnosis of hypertension: Secondary | ICD-10-CM | POA: Diagnosis not present

## 2016-07-29 ENCOUNTER — Other Ambulatory Visit: Payer: Self-pay | Admitting: Nurse Practitioner

## 2016-08-17 ENCOUNTER — Other Ambulatory Visit: Payer: Self-pay | Admitting: Nurse Practitioner

## 2016-08-26 ENCOUNTER — Other Ambulatory Visit: Payer: Self-pay | Admitting: Nurse Practitioner

## 2016-08-29 ENCOUNTER — Other Ambulatory Visit: Payer: Self-pay | Admitting: *Deleted

## 2016-08-29 DIAGNOSIS — K219 Gastro-esophageal reflux disease without esophagitis: Secondary | ICD-10-CM

## 2016-08-29 MED ORDER — OMEPRAZOLE 20 MG PO CPDR: 20.0000 mg | DELAYED_RELEASE_CAPSULE | Freq: Every day | ORAL | 2 refills | Status: DC

## 2016-08-29 NOTE — Telephone Encounter (Signed)
[facility]  

## 2016-08-30 ENCOUNTER — Encounter: Payer: Self-pay | Admitting: Nurse Practitioner

## 2016-08-30 ENCOUNTER — Ambulatory Visit (INDEPENDENT_AMBULATORY_CARE_PROVIDER_SITE_OTHER): Payer: Medicare Other | Admitting: Nurse Practitioner

## 2016-08-30 VITALS — BP 162/86 | HR 78 | Temp 98.1°F | Resp 17 | Ht 65.0 in | Wt 205.4 lb

## 2016-08-30 DIAGNOSIS — I6523 Occlusion and stenosis of bilateral carotid arteries: Secondary | ICD-10-CM | POA: Insufficient documentation

## 2016-08-30 DIAGNOSIS — I10 Essential (primary) hypertension: Secondary | ICD-10-CM

## 2016-08-30 DIAGNOSIS — G8929 Other chronic pain: Secondary | ICD-10-CM | POA: Diagnosis not present

## 2016-08-30 DIAGNOSIS — N183 Chronic kidney disease, stage 3 unspecified: Secondary | ICD-10-CM | POA: Insufficient documentation

## 2016-08-30 DIAGNOSIS — M545 Low back pain, unspecified: Secondary | ICD-10-CM | POA: Insufficient documentation

## 2016-08-30 MED ORDER — TRAMADOL HCL 50 MG PO TABS: 50.0000 mg | ORAL_TABLET | Freq: Three times a day (TID) | ORAL | 0 refills | Status: DC | PRN

## 2016-08-30 NOTE — Progress Notes (Signed)
Careteam: Patient Care Team: [name], NP as PCP - General (Geriatric Medicine) Dustin Folks, MD as Consulting Physician (Hematology and Oncology) Joellyn Haff, MD as Consulting Physician (Obstetrics and Gynecology)  Advanced Directive information Does Patient Have a Medical Advance Directive?: No  Allergies  Allergen Reactions  . Fish-Derived Products     shellfish  . Neomy-Bacit-Polymyx-Pramoxine     Not sure which antibiotic she has a reaction to    Chief Complaint  Patient presents with  . Acute Visit    Pt is being seen due to medication concerns. She was prescribed meloxicam 7.5 mg tablet (unsure of presriber name) on 08/19/16 and it has been making her dizzy and off balance.   . Other    Daughter in room     HPI: Patient is a 82 y.o. female seen in the office today to discuss new medication. Pt seeing Dr Kathyrn Sheriff with neurosurgery. Pt was previously taking diclofenac but had her very dizzy therefore she was changed to meloxicam.  Reports her pain is somewhat better- been on meloxicam for 1 week. Felt like her blood pressure was up so she wanted this checked today.  Does not check blood pressure at home. Has a blood pressure cuff.  Has a little bit of pressure in the back of her head which she contributes to bp being up but also reports dizziness and went to ED for this, thought to be peripheral vertigo. Pt also reports blood pressure fluctuates a lot (reviewed epic and blood pressure varies from 170s-110/50-80s) Reports low sodium diet.  States she will occasionally have a headache.  Went to cardiology due to palpitations and Holter monitor did not show any arrhythmias. Appears she missed follow up with cardiology office.      Review of Systems:  Review of Systems  Constitutional: Negative for chills, fever, malaise/fatigue and weight loss.  HENT: Negative for congestion.   Eyes: Negative for blurred vision.  Respiratory: Negative for  cough and shortness of breath.   Cardiovascular: Negative for chest pain, palpitations and leg swelling.  Gastrointestinal: Negative for abdominal pain, blood in stool, constipation and melena.  Genitourinary: Negative for dysuria.  Musculoskeletal: Positive for back pain and joint pain. Negative for falls.  Skin: Negative for itching and rash.  Neurological: Positive for dizziness and headaches. Negative for loss of consciousness and weakness.  Psychiatric/Behavioral: Positive for memory loss. Negative for depression. The patient is not nervous/anxious and does not have insomnia.     Past Medical History:  Diagnosis Date  . Anemia   . Anxiety   . Arthritis   . Breast cancer ([place])   . Chronic kidney disease   . Dementia   . Depression   . Gastroesophageal reflux disease   . Heartburn   . Hypertension   . Hypothyroidism   . Leg cramps   . Post-menopause bleeding   . Restless leg syndrome 10/07/2014   Past Surgical History:  Procedure Laterality Date  . BREAST SURGERY Left    Lumpectomy   . CATARACT EXTRACTION, BILATERAL    . hysteroscopy biopsy     Social History:   reports that she has quit smoking. She has a 2.00 pack-year smoking history. She has never used smokeless tobacco. She reports that she does not drink alcohol or use drugs.  Family History  Problem Relation Age of Onset  . Hypertension Mother   . Arthritis Mother   . Heart disease Mother   . Hypertension Father   .  Heart disease Father   . Cancer Brother   . Breast cancer Paternal Aunt     Medications: Patient's Medications  New Prescriptions   No medications on file  Previous Medications   ACETAMINOPHEN (TYLENOL) 325 MG TABLET    Take 650 mg by mouth as needed.   AMLODIPINE (NORVASC) 10 MG TABLET    Take 1 tablet by mouth every day.   ASPIRIN EC 81 MG TABLET    Take 81 mg by mouth daily.   BUSPIRONE (BUSPAR) 7.5 MG TABLET    TAKE 1 TABLET (7.5 MG TOTAL) BY MOUTH 2 (TWO) TIMES DAILY.   COD LIVER OIL  CAPS    Take 1 capsule by mouth 2 (two) times daily.   DONEPEZIL (ARICEPT) 5 MG TABLET    Take 1 tablet (5 mg total) by mouth at bedtime.   GARLIC 10 MG CAPS    Take 1 capsule by mouth 2 (two) times daily.   LEVOTHYROXINE (SYNTHROID, LEVOTHROID) 100 MCG TABLET    TAKE 1 TABLET (100 MCG TOTAL) BY MOUTH DAILY BEFORE BREAKFAST.   LOSARTAN (COZAAR) 50 MG TABLET    Take 1 tablet (50 mg total) by mouth daily.   MAGNESIUM ([employer]S TRIPLE MAGNESIUM COMPLEX) 400 MG CAPS    Take by mouth.   MELOXICAM (MOBIC) 7.5 MG TABLET    Take 1 tablet by mouth daily.   METOPROLOL SUCCINATE (TOPROL-XL) 50 MG 24 HR TABLET    TAKE 1 TABLET BY MOUTH DAILY WITH OR IMMEDIATELY FOLLOWING A MEAL   OMEPRAZOLE (PRILOSEC) 20 MG CAPSULE    Take 1 capsule (20 mg total) by mouth daily.   ROPINIROLE (REQUIP) 1 MG TABLET    TAKE 1 TABLET (1 MG TOTAL) BY MOUTH AT BEDTIME.   TRAZODONE (DESYREL) 50 MG TABLET    TAKE 1 TABLET (50 MG TOTAL) BY MOUTH AT BEDTIME.   VITAMIN A ([employer]S VITAMIN A) 10000 UNIT CAPSULE    Take by mouth.  Modified Medications   No medications on file  Discontinued Medications   DONEPEZIL (ARICEPT) 10 MG TABLET    Take 10 mg by mouth at bedtime.   MECLIZINE (ANTIVERT) 25 MG TABLET    Take 1 tablet (25 mg total) by mouth 3 (three) times daily as needed for dizziness.   TAMOXIFEN (NOLVADEX) 20 MG TABLET    Take 20 mg by mouth daily.   TIZANIDINE (ZANAFLEX) 4 MG TABLET    Take 1 tablet (4 mg total) by mouth daily.     Physical Exam:  Vitals:   08/30/16 1101  BP: (!) 162/86  Pulse: 78  Resp: 17  Temp: 98.1 F (36.7 C)  TempSrc: Oral  SpO2: 98%  Weight: 205 lb 6.4 oz (93.2 kg)  Height: _0  (1.651 m)   Body mass index is 34.18 kg/m.  Physical Exam  Constitutional: She appears well-developed and well-nourished. No distress.  HENT:  Head: Normocephalic and atraumatic.  Mouth/Throat: Oropharynx is clear and moist.  Eyes: Pupils are equal, round, and reactive to light. Conjunctivae are normal.    Cardiovascular: Normal rate, regular rhythm and normal heart sounds.   Pulmonary/Chest: Effort normal and breath sounds normal.  Abdominal: Soft. Bowel sounds are normal.  Musculoskeletal: She exhibits no edema or tenderness.  Neurological: She is alert.  Skin: Skin is warm and dry. She is not diaphoretic.  Psychiatric: She has a normal mood and affect.  Mild short term memory loss    Labs reviewed: Basic Metabolic Panel:  Recent Labs  09/26/15 1518 04/15/16 1447 05/27/16 2000 05/27/16 2020  NA 140 139 138 142  K 5.1 4.4 4.1 4.2  CL 109 106 108 107  CO2 _0 --   GLUCOSE 100* 97 110* 110*  BUN _1 CREATININE 1.51* 1.83* 1.57* 1.60*  CALCIUM 9.0 9.1 9.2  --   TSH  --  0.54  --   --    Liver Function Tests:  Recent Labs  04/15/16 1447 05/27/16 2000  AST 21 31  ALT 15 24  ALKPHOS 47 51  BILITOT 0.2 0.3  PROT 7.0 7.4  ALBUMIN 3.7 3.9   No results for input(s): LIPASE, AMYLASE in the last 8760 hours. No results for input(s): AMMONIA in the last 8760 hours. CBC:  Recent Labs  04/15/16 1447 05/27/16 2000 05/27/16 2020  WBC 5.0 4.6  --   NEUTROABS 2,200 2.4  --   HGB 10.8* 12.2 12.2  HCT 33.6* 36.3 36.0  MCV 93.1 92.1  --   PLT 261 236  --    Lipid Panel: No results for input(s): CHOL, HDL, LDLCALC, TRIG, CHOLHDL, LDLDIRECT in the last 8760 hours. TSH:  Recent Labs  04/15/16 1447  TSH 0.54   A1C: No results found for: HGBA1C   Assessment/Plan 1. Essential hypertension -elevated today however blood pressure labile.  -to cont low sodium diet and medication -will stop meloxicam and have pt take blood pressures at home. To bring to follow up visit.  - CMP with eGFR; Future - CBC with Differential/Platelets; Future  2. Chronic midline low back pain without sciatica -neurospinal specialist placed her on meloxicam however due to CKD and elevated BP will stop at this time.  -will try tramadol- may need to increase frequency and does if  current not effective and without adverse effect.  - traMADol (ULTRAM) 50 MG tablet; Take 1 tablet (50 mg total) by mouth every 8 (eight) hours as needed.  Dispense: 30 tablet; Refill: 0  3. Bilateral carotid artery stenosis -carotid US stable, to follow up in a year per cardiologist  - Lipid Panel; Future  4. CKD (chronic kidney disease) stage 3, GFR 30-59 ml/min -to Avoid NSAIDS, Cr ranges from 1.6-1.8 in the last year.  -encouraged proper hydration with water.  -will follow up lab, may need nephrology referral if GFR does down on recheck.  - CMP with eGFR; Future - CBC with Differential/Platelets; Future  Next appt: 2 weeks with lab work before. Sooner if needed  [employer]. Harle Battiest  [facility] [phone] 8 am - 5 pm) [phone] (after hours)

## 2016-08-30 NOTE — Patient Instructions (Addendum)
STOP meloxicam   To use tramadol as needed for pain  To take blood pressure at home after she has been sitting for at least 5 mins and at least 30 mins after medication  2 week appt for blood pressure check (call sooner if needed)

## 2016-09-02 ENCOUNTER — Telehealth: Payer: Self-pay

## 2016-09-02 NOTE — Telephone Encounter (Signed)
-----   Message from [name], NP sent at 08/30/2016  4:07 PM EDT ----- When you call on Monday will you set her up with fasting lab work prior to next appt- it looks like it has been over a year since she has had FASTING labs. I have already placed the orders. Thanks.

## 2016-09-02 NOTE — Telephone Encounter (Signed)
I called patient to schedule a fasting lab appointment per Janett Billow prior to next appointment on 09/24/16.   Left a message for patient to call the office.

## 2016-09-04 ENCOUNTER — Telehealth: Payer: Self-pay

## 2016-09-04 NOTE — Telephone Encounter (Signed)
I spoke with patient and she stated that she would pick up handicap application when she comes in for appointment on 09/24/16.   She stated that she had just taken her blood pressure and it was 150/58. She stated that she is not having any dizziness or headaches. Asked patient to call if blood pressure readings continued to be high.

## 2016-09-04 NOTE — Telephone Encounter (Signed)
I called patient to let her know that a handicap placard application has been completed and is ready to be picked up.   I also wanted to check and see how patient's blood pressure has been running. It was very high at last appointment.

## 2016-09-04 NOTE — Telephone Encounter (Signed)
Noted, thank you

## 2016-09-22 ENCOUNTER — Other Ambulatory Visit: Payer: Self-pay | Admitting: Nurse Practitioner

## 2016-09-24 ENCOUNTER — Encounter: Payer: Self-pay | Admitting: Nurse Practitioner

## 2016-09-24 ENCOUNTER — Other Ambulatory Visit: Payer: Self-pay

## 2016-09-24 ENCOUNTER — Ambulatory Visit (INDEPENDENT_AMBULATORY_CARE_PROVIDER_SITE_OTHER): Payer: Medicare Other | Admitting: Nurse Practitioner

## 2016-09-24 VITALS — BP 124/78 | HR 68 | Temp 98.2°F | Resp 17 | Ht 65.0 in | Wt 198.2 lb

## 2016-09-24 DIAGNOSIS — G8929 Other chronic pain: Secondary | ICD-10-CM | POA: Diagnosis not present

## 2016-09-24 DIAGNOSIS — I6523 Occlusion and stenosis of bilateral carotid arteries: Secondary | ICD-10-CM | POA: Diagnosis not present

## 2016-09-24 DIAGNOSIS — M545 Low back pain, unspecified: Secondary | ICD-10-CM

## 2016-09-24 DIAGNOSIS — I10 Essential (primary) hypertension: Secondary | ICD-10-CM | POA: Diagnosis not present

## 2016-09-24 MED ORDER — TRAMADOL HCL 50 MG PO TABS: 50.0000 mg | ORAL_TABLET | Freq: Three times a day (TID) | ORAL | 0 refills | Status: DC | PRN

## 2016-09-24 NOTE — Progress Notes (Signed)
Careteam: Patient Care Team: [name], NP as PCP - General (Geriatric Medicine) Dustin Folks, MD as Consulting Physician (Hematology and Oncology) Joellyn Haff, MD as Consulting Physician (Obstetrics and Gynecology) Consuella Lose, MD as Consulting Physician (Neurosurgery)  Advanced Directive information Does Patient Have a Medical Advance Directive?: No  Allergies  Allergen Reactions  . Fish-Derived Products     shellfish  . Neomy-Bacit-Polymyx-Pramoxine     Not sure which antibiotic she has a reaction to    Chief Complaint  Patient presents with  . Follow-up    Pt is being seen for a 2 week follow up on blood pressure.   . Other    Daughter in room     HPI: Patient is a 82 y.o. female seen in the office today to follow up blood pressure.  pts taking blood pressure at home which were elevated however cuff is too small and did not correlate with manual reading today.  At last visit we stopped Meloxicam due to CKD and using tramadol which is effective. Does not take it close to bedtime because it causes nightmares.  Feels like pain is controlled at this time.   Looking into CAP program for more assistance at home. Daughter feels like she needs more help because she can not climb up and down the stairs. Needs help fixing food. Assistance getting in and out of bath tub due to pain and feeling unsteady. Laundry also on another level.  Back is a major problem.   Having headaches, has been having them for about a year. Feels like it due to equilibrium being off. Reports head is sore as well. Headache is mostly central on the top of her head. Occasionally will have blurred vision. Also has dizziness too. Apparently MRI of Aaron Edelman was ordered in 2016 but she never got this done.  CT head in May Mild atrophy without evidence for acute intracranial abnormality which was ordered in the ED when she had dizziness.   Review of Systems:  Review of Systems    Constitutional: Negative for chills, fever, malaise/fatigue and weight loss.  HENT: Negative for congestion.   Eyes: Negative for blurred vision.  Respiratory: Negative for cough and shortness of breath.   Cardiovascular: Negative for chest pain, palpitations and leg swelling.  Gastrointestinal: Negative for abdominal pain, blood in stool, constipation and melena.  Genitourinary: Negative for dysuria.  Musculoskeletal: Positive for back pain and joint pain. Negative for falls.  Skin: Negative for itching and rash.  Neurological: Positive for dizziness and headaches. Negative for loss of consciousness and weakness.       Denies headache and dizziness at this time  Psychiatric/Behavioral: Positive for memory loss. Negative for depression. The patient is not nervous/anxious and does not have insomnia.     Past Medical History:  Diagnosis Date  . Anemia   . Anterolisthesis    grade 1: L3-4  . Anxiety   . Arthritis   . Breast cancer ([place])   . Chronic kidney disease   . Dementia   . Depression   . Gastroesophageal reflux disease   . Heartburn   . Hypertension   . Hypothyroidism   . Leg cramps   . Post-menopause bleeding   . Restless leg syndrome 10/07/2014   Past Surgical History:  Procedure Laterality Date  . BREAST SURGERY Left    Lumpectomy   . CATARACT EXTRACTION, BILATERAL    . hysteroscopy biopsy     Social History:   reports  that she has quit smoking. She has a 2.00 pack-year smoking history. She has never used smokeless tobacco. She reports that she does not drink alcohol or use drugs.  Family History  Problem Relation Age of Onset  . Hypertension Mother   . Arthritis Mother   . Heart disease Mother   . Hypertension Father   . Heart disease Father   . Cancer Brother   . Breast cancer Paternal Aunt     Medications: Patient's Medications  New Prescriptions   No medications on file  Previous Medications   ACETAMINOPHEN (TYLENOL) 325 MG CAPS    Take 2  capsules by mouth every 6 (six) hours as needed.   AMLODIPINE (NORVASC) 10 MG TABLET    Take 1 tablet by mouth every day.   ASPIRIN EC 81 MG TABLET    Take 81 mg by mouth daily.   BUSPIRONE (BUSPAR) 7.5 MG TABLET    TAKE 1 TABLET (7.5 MG TOTAL) BY MOUTH 2 (TWO) TIMES DAILY.   COD LIVER OIL CAPS    Take 1 capsule by mouth 2 (two) times daily.   DONEPEZIL (ARICEPT) 5 MG TABLET    Take 1 tablet (5 mg total) by mouth at bedtime.   GARLIC 10 MG CAPS    Take 1 capsule by mouth 2 (two) times daily.   LEVOTHYROXINE (SYNTHROID, LEVOTHROID) 100 MCG TABLET    TAKE 1 TABLET (100 MCG TOTAL) BY MOUTH DAILY BEFORE BREAKFAST.   LOSARTAN (COZAAR) 50 MG TABLET    Take 1 tablet (50 mg total) by mouth daily.   MAGNESIUM (CVS TRIPLE MAGNESIUM COMPLEX) 400 MG CAPS    Take by mouth.   METOPROLOL SUCCINATE (TOPROL-XL) 50 MG 24 HR TABLET    TAKE 1 TABLET BY MOUTH DAILY WITH OR IMMEDIATELY FOLLOWING A MEAL   OMEPRAZOLE (PRILOSEC) 20 MG CAPSULE    Take 1 capsule (20 mg total) by mouth daily.   ROPINIROLE (REQUIP) 1 MG TABLET    TAKE 1 TABLET (1 MG TOTAL) BY MOUTH AT BEDTIME.   TRAMADOL (ULTRAM) 50 MG TABLET    Take 1 tablet (50 mg total) by mouth every 8 (eight) hours as needed.   TRAZODONE (DESYREL) 50 MG TABLET    TAKE 1 TABLET (50 MG TOTAL) BY MOUTH AT BEDTIME.   VITAMIN A (CVS VITAMIN A) 10000 UNIT CAPSULE    Take by mouth.  Modified Medications   No medications on file  Discontinued Medications   ACETAMINOPHEN (TYLENOL) 325 MG TABLET    Take 650 mg by mouth as needed.     Physical Exam:  Vitals:   09/24/16 1527  BP: 124/78  Pulse: 68  Resp: 17  Temp: 98.2 F (36.8 C)  TempSrc: Oral  SpO2: 97%  Weight: 198 lb 3.2 oz (89.9 kg)  Height: 5\' 5"  (1.651 m)   Body mass index is 32.98 kg/m.  Physical Exam  Constitutional: She appears well-developed and well-nourished. No distress.  HENT:  Head: Normocephalic and atraumatic.  Mouth/Throat: Oropharynx is clear and moist.  Eyes: Pupils are equal, round,  and reactive to light. Conjunctivae are normal.  Cardiovascular: Normal rate and regular rhythm.   Murmur heard. Pulmonary/Chest: Effort normal and breath sounds normal.  Abdominal: Soft. Bowel sounds are normal.  Musculoskeletal: She exhibits no edema or tenderness.  Neurological: She is alert.  Skin: Skin is warm and dry. She is not diaphoretic.  Psychiatric: She has a normal mood and affect.  Mild short term memory loss   Labs  reviewed: Basic Metabolic Panel:  Recent Labs  09/26/15 1518 04/15/16 1447 05/27/16 2000 05/27/16 2020  NA 140 139 138 142  K 5.1 4.4 4.1 4.2  CL 109 106 108 107  CO2 24 25 23   --   GLUCOSE 100* 97 110* 110*  BUN 16 22 11 12   CREATININE 1.51* 1.83* 1.57* 1.60*  CALCIUM 9.0 9.1 9.2  --   TSH  --  0.54  --   --    Liver Function Tests:  Recent Labs  04/15/16 1447 05/27/16 2000  AST 21 31  ALT 15 24  ALKPHOS 47 51  BILITOT 0.2 0.3  PROT 7.0 7.4  ALBUMIN 3.7 3.9   No results for input(s): LIPASE, AMYLASE in the last 8760 hours. No results for input(s): AMMONIA in the last 8760 hours. CBC:  Recent Labs  04/15/16 1447 05/27/16 2000 05/27/16 2020  WBC 5.0 4.6  --   NEUTROABS 2,200 2.4  --   HGB 10.8* 12.2 12.2  HCT 33.6* 36.3 36.0  MCV 93.1 92.1  --   PLT 261 236  --    Lipid Panel: No results for input(s): CHOL, HDL, LDLCALC, TRIG, CHOLHDL, LDLDIRECT in the last 8760 hours. TSH:  Recent Labs  04/15/16 1447  TSH 0.54   A1C: No results found for: HGBA1C   Assessment/Plan 1. Essential hypertension Improved at visit today, will have pt get larger cuff at home to take blood pressure readings. To cont on norvasc, cozaar and toprol XL, low sodium diet given  2. Chronic midline low back pain without sciatica -controlled on tramadol however chronic pain inhibits her ADLs at times. Request CAP assistance which her daughter is looking into  Next appt: fasting labs when able and follow up in 3 months.  Carlos American. Harle Battiest  [facility] [phone] 8 am - 5 pm) [phone] (after hours)

## 2016-09-24 NOTE — Patient Instructions (Signed)
To make appt for fasting blood work.   DASH Eating Plan DASH stands for "Dietary Approaches to Stop Hypertension." The DASH eating plan is a healthy eating plan that has been shown to reduce high blood pressure (hypertension). It may also reduce your risk for type 2 diabetes, heart disease, and stroke. The DASH eating plan may also help with weight loss. What are tips for following this plan? General guidelines  Avoid eating more than 2,300 mg (milligrams) of salt (sodium) a day. If you have hypertension, you may need to reduce your sodium intake to 1,500 mg a day.  Limit alcohol intake to no more than 1 drink a day for nonpregnant women and 2 drinks a day for men. One drink equals 12 oz of beer, 5 oz of wine, or 1 oz of hard liquor.  Work with your health care provider to maintain a healthy body weight or to lose weight. Ask what an ideal weight is for you.  Get at least 30 minutes of exercise that causes your heart to beat faster (aerobic exercise) most days of the week. Activities may include walking, swimming, or biking.  Work with your health care provider or diet and nutrition specialist (dietitian) to adjust your eating plan to your individual calorie needs. Reading food labels  Check food labels for the amount of sodium per serving. Choose foods with less than 5 percent of the Daily Value of sodium. Generally, foods with less than 300 mg of sodium per serving fit into this eating plan.  To find whole grains, look for the word "whole" as the first word in the ingredient list. Shopping  Buy products labeled as "low-sodium" or "no salt added."  Buy fresh foods. Avoid canned foods and premade or frozen meals. Cooking  Avoid adding salt when cooking. Use salt-free seasonings or herbs instead of table salt or sea salt. Check with your health care provider or pharmacist before using salt substitutes.  Do not fry foods. Cook foods using healthy methods such as baking, boiling,  grilling, and broiling instead.  Cook with heart-healthy oils, such as olive, canola, soybean, or sunflower oil. Meal planning   Eat a balanced diet that includes: ? 5 or more servings of fruits and vegetables each day. At each meal, try to fill half of your plate with fruits and vegetables. ? Up to 6-8 servings of whole grains each day. ? Less than 6 oz of lean meat, poultry, or fish each day. A 3-oz serving of meat is about the same size as a deck of cards. One egg equals 1 oz. ? 2 servings of low-fat dairy each day. ? A serving of nuts, seeds, or beans 5 times each week. ? Heart-healthy fats. Healthy fats called Omega-3 fatty acids are found in foods such as flaxseeds and coldwater fish, like sardines, salmon, and mackerel.  Limit how much you eat of the following: ? Canned or prepackaged foods. ? Food that is high in trans fat, such as fried foods. ? Food that is high in saturated fat, such as fatty meat. ? Sweets, desserts, sugary drinks, and other foods with added sugar. ? Full-fat dairy products.  Do not salt foods before eating.  Try to eat at least 2 vegetarian meals each week.  Eat more home-cooked food and less restaurant, buffet, and fast food.  When eating at a restaurant, ask that your food be prepared with less salt or no salt, if possible. What foods are recommended? The items listed may not be  a complete list. Talk with your dietitian about what dietary choices are best for you. Grains Whole-grain or whole-wheat bread. Whole-grain or whole-wheat pasta. Brown rice. Modena Morrow. Bulgur. Whole-grain and low-sodium cereals. Pita bread. Low-fat, low-sodium crackers. Whole-wheat flour tortillas. Vegetables Fresh or frozen vegetables (raw, steamed, roasted, or grilled). Low-sodium or reduced-sodium tomato and vegetable juice. Low-sodium or reduced-sodium tomato sauce and tomato paste. Low-sodium or reduced-sodium canned vegetables. Fruits All fresh, dried, or frozen  fruit. Canned fruit in natural juice (without added sugar). Meat and other protein foods Skinless chicken or [place]. Ground chicken or [place]. Pork with fat trimmed off. Fish and seafood. Egg whites. Dried beans, peas, or lentils. Unsalted nuts, nut butters, and seeds. Unsalted canned beans. Lean cuts of beef with fat trimmed off. Low-sodium, lean deli meat. Dairy Low-fat (1%) or fat-free (skim) milk. Fat-free, low-fat, or reduced-fat cheeses. Nonfat, low-sodium ricotta or cottage cheese. Low-fat or nonfat yogurt. Low-fat, low-sodium cheese. Fats and oils Soft margarine without trans fats. Vegetable oil. Low-fat, reduced-fat, or light mayonnaise and salad dressings (reduced-sodium). Canola, safflower, olive, soybean, and sunflower oils. Avocado. Seasoning and other foods Herbs. Spices. Seasoning mixes without salt. Unsalted popcorn and pretzels. Fat-free sweets. What foods are not recommended? The items listed may not be a complete list. Talk with your dietitian about what dietary choices are best for you. Grains Baked goods made with fat, such as croissants, muffins, or some breads. Dry pasta or rice meal packs. Vegetables Creamed or fried vegetables. Vegetables in a cheese sauce. Regular canned vegetables (not low-sodium or reduced-sodium). Regular canned tomato sauce and paste (not low-sodium or reduced-sodium). Regular tomato and vegetable juice (not low-sodium or reduced-sodium). Angie Fava. Olives. Fruits Canned fruit in a light or heavy syrup. Fried fruit. Fruit in cream or butter sauce. Meat and other protein foods Fatty cuts of meat. Ribs. Fried meat. Berniece Salines. Sausage. Bologna and other processed lunch meats. Salami. Fatback. Hotdogs. Bratwurst. Salted nuts and seeds. Canned beans with added salt. Canned or smoked fish. Whole eggs or egg yolks. Chicken or [place] with skin. Dairy Whole or 2% milk, cream, and half-and-half. Whole or full-fat cream cheese. Whole-fat or sweetened yogurt. Full-fat  cheese. Nondairy creamers. Whipped toppings. Processed cheese and cheese spreads. Fats and oils Butter. Stick margarine. Lard. Shortening. Ghee. Bacon fat. Tropical oils, such as coconut, palm kernel, or palm oil. Seasoning and other foods Salted popcorn and pretzels. Onion salt, garlic salt, seasoned salt, table salt, and sea salt. Worcestershire sauce. Tartar sauce. Barbecue sauce. Teriyaki sauce. Soy sauce, including reduced-sodium. Steak sauce. Canned and packaged gravies. Fish sauce. Oyster sauce. Cocktail sauce. Horseradish that you find on the shelf. Ketchup. Mustard. Meat flavorings and tenderizers. Bouillon cubes. Hot sauce and Tabasco sauce. Premade or packaged marinades. Premade or packaged taco seasonings. Relishes. Regular salad dressings. Where to find more information:  National Heart, Lung, and [place]: https://wilson-eaton.com/  American Heart Association: www.heart.org Summary  The DASH eating plan is a healthy eating plan that has been shown to reduce high blood pressure (hypertension). It may also reduce your risk for type 2 diabetes, heart disease, and stroke.  With the DASH eating plan, you should limit salt (sodium) intake to 2,300 mg a day. If you have hypertension, you may need to reduce your sodium intake to 1,500 mg a day.  When on the DASH eating plan, aim to eat more fresh fruits and vegetables, whole grains, lean proteins, low-fat dairy, and heart-healthy fats.  Work with your health care provider or diet and nutrition specialist (  dietitian) to adjust your eating plan to your individual calorie needs. This information is not intended to replace advice given to you by your health care provider. Make sure you discuss any questions you have with your health care provider. Document Released: 12/20/2010 Document Revised: 12/25/2015 Document Reviewed: 12/25/2015 Elsevier Interactive Patient Education  2017 [employer].

## 2016-09-25 ENCOUNTER — Other Ambulatory Visit: Payer: Medicare Other

## 2016-09-25 DIAGNOSIS — N183 Chronic kidney disease, stage 3 unspecified: Secondary | ICD-10-CM

## 2016-09-25 DIAGNOSIS — I6523 Occlusion and stenosis of bilateral carotid arteries: Secondary | ICD-10-CM

## 2016-09-25 DIAGNOSIS — I10 Essential (primary) hypertension: Secondary | ICD-10-CM

## 2016-09-25 LAB — COMPLETE METABOLIC PANEL WITH GFR
AG Ratio: 1.2 (calc) (ref 1.0–2.5)
ALT: 19 U/L (ref 6–29)
AST: 24 U/L (ref 10–35)
Albumin: 3.7 g/dL (ref 3.6–5.1)
Alkaline phosphatase (APISO): 44 U/L (ref 33–130)
BUN/Creatinine Ratio: 11 (calc) (ref 6–22)
BUN: 20 mg/dL (ref 7–25)
CO2: 26 mmol/L (ref 20–32)
Calcium: 9.4 mg/dL (ref 8.6–10.4)
Chloride: 105 mmol/L (ref 98–110)
Creat: 1.74 mg/dL — ABNORMAL HIGH (ref 0.60–0.88)
GFR, Est African American: 32 mL/min/{1.73_m2} — ABNORMAL LOW (ref >=60)
GFR, Est Non African American: 27 mL/min/{1.73_m2} — ABNORMAL LOW (ref >=60)
Globulin: 3.2 g/dL (calc) (ref 1.9–3.7)
Glucose, Bld: 102 mg/dL — ABNORMAL HIGH (ref 65–99)
Potassium: 4.7 mmol/L (ref 3.5–5.3)
Sodium: 139 mmol/L (ref 135–146)
Total Bilirubin: 0.3 mg/dL (ref 0.2–1.2)
Total Protein: 6.9 g/dL (ref 6.1–8.1)

## 2016-09-25 LAB — CBC WITH DIFFERENTIAL/PLATELET
Basophils Absolute: 19 cells/uL (ref 0–200)
Basophils Relative: 0.5 %
Eosinophils Absolute: 89 cells/uL (ref 15–500)
Eosinophils Relative: 2.4 %
HCT: 32.5 % — ABNORMAL LOW (ref 35.0–45.0)
Hemoglobin: 11.3 g/dL — ABNORMAL LOW (ref 11.7–15.5)
Lymphs Abs: 1983 cells/uL (ref 850–3900)
MCH: 31.1 pg (ref 27.0–33.0)
MCHC: 34.8 g/dL (ref 32.0–36.0)
MCV: 89.5 fL (ref 80.0–100.0)
MPV: 10.2 fL (ref 7.5–12.5)
Monocytes Relative: 9.4 %
Neutro Abs: 1262 cells/uL — ABNORMAL LOW (ref 1500–7800)
Neutrophils Relative %: 34.1 %
Platelets: 237 10*3/uL (ref 140–400)
RBC: 3.63 10*6/uL — ABNORMAL LOW (ref 3.80–5.10)
RDW: 13.2 % (ref 11.0–15.0)
Total Lymphocyte: 53.6 %
WBC mixed population: 348 cells/uL (ref 200–950)
WBC: 3.7 10*3/uL — ABNORMAL LOW (ref 3.8–10.8)

## 2016-09-25 LAB — LIPID PANEL
Cholesterol: 171 mg/dL (ref <200)
HDL: 37 mg/dL — ABNORMAL LOW (ref >50)
LDL Cholesterol (Calc): 112 mg/dL (calc) — ABNORMAL HIGH
Non-HDL Cholesterol (Calc): 134 mg/dL (calc) — ABNORMAL HIGH (ref <130)
Total CHOL/HDL Ratio: 4.6 (calc) (ref <5.0)
Triglycerides: 109 mg/dL (ref <150)

## 2016-09-26 ENCOUNTER — Other Ambulatory Visit: Payer: Self-pay | Admitting: Nurse Practitioner

## 2016-09-26 DIAGNOSIS — N183 Chronic kidney disease, stage 3 unspecified: Secondary | ICD-10-CM

## 2016-10-21 ENCOUNTER — Other Ambulatory Visit: Payer: Self-pay | Admitting: Nurse Practitioner

## 2016-10-22 ENCOUNTER — Other Ambulatory Visit: Payer: Self-pay | Admitting: Nurse Practitioner

## 2016-10-24 ENCOUNTER — Other Ambulatory Visit: Payer: Self-pay | Admitting: Internal Medicine

## 2016-10-24 DIAGNOSIS — G3184 Mild cognitive impairment, so stated: Secondary | ICD-10-CM

## 2016-10-31 ENCOUNTER — Other Ambulatory Visit: Payer: Self-pay | Admitting: Nurse Practitioner

## 2016-11-28 ENCOUNTER — Other Ambulatory Visit: Payer: Self-pay | Admitting: Nurse Practitioner

## 2016-11-28 DIAGNOSIS — F325 Major depressive disorder, single episode, in full remission: Secondary | ICD-10-CM

## 2016-11-28 MED ORDER — SERTRALINE HCL 25 MG PO TABS
25.0000 mg | ORAL_TABLET | Freq: Every day | ORAL | 2 refills | Status: DC
Start: 2016-11-28 — End: 2016-12-26

## 2016-11-28 NOTE — Progress Notes (Signed)
Patient stated that she only has 3 more days of buspirone. Patient verbalized understanding of starting the zoloft. Patient scheduled a follow up appt.

## 2016-11-28 NOTE — Progress Notes (Signed)
Please call pt and notify her Buspirone is on national backorder. We will need to change this medication. To start zoloft 25 mg daily-- may go ahead and start this but to decrease buspirone to 7.5 mg daily until complete.  Will need 4 week follow up due to medication change

## 2016-11-29 DIAGNOSIS — N2581 Secondary hyperparathyroidism of renal origin: Secondary | ICD-10-CM | POA: Diagnosis not present

## 2016-11-29 DIAGNOSIS — F0390 Unspecified dementia without behavioral disturbance: Secondary | ICD-10-CM | POA: Diagnosis not present

## 2016-11-29 DIAGNOSIS — I129 Hypertensive chronic kidney disease with stage 1 through stage 4 chronic kidney disease, or unspecified chronic kidney disease: Secondary | ICD-10-CM | POA: Diagnosis not present

## 2016-11-29 DIAGNOSIS — C50919 Malignant neoplasm of unspecified site of unspecified female breast: Secondary | ICD-10-CM | POA: Diagnosis not present

## 2016-11-29 DIAGNOSIS — D631 Anemia in chronic kidney disease: Secondary | ICD-10-CM | POA: Diagnosis not present

## 2016-11-29 DIAGNOSIS — I739 Peripheral vascular disease, unspecified: Secondary | ICD-10-CM | POA: Diagnosis not present

## 2016-11-29 DIAGNOSIS — F419 Anxiety disorder, unspecified: Secondary | ICD-10-CM | POA: Diagnosis not present

## 2016-11-29 DIAGNOSIS — E039 Hypothyroidism, unspecified: Secondary | ICD-10-CM | POA: Diagnosis not present

## 2016-11-29 DIAGNOSIS — N390 Urinary tract infection, site not specified: Secondary | ICD-10-CM | POA: Diagnosis not present

## 2016-11-29 DIAGNOSIS — F329 Major depressive disorder, single episode, unspecified: Secondary | ICD-10-CM | POA: Diagnosis not present

## 2016-11-29 DIAGNOSIS — N183 Chronic kidney disease, stage 3 (moderate): Secondary | ICD-10-CM | POA: Diagnosis not present

## 2016-11-29 DIAGNOSIS — G2581 Restless legs syndrome: Secondary | ICD-10-CM | POA: Diagnosis not present

## 2016-12-02 DIAGNOSIS — N183 Chronic kidney disease, stage 3 (moderate): Secondary | ICD-10-CM | POA: Diagnosis not present

## 2016-12-03 ENCOUNTER — Other Ambulatory Visit: Payer: Self-pay | Admitting: Nephrology

## 2016-12-03 DIAGNOSIS — N183 Chronic kidney disease, stage 3 unspecified: Secondary | ICD-10-CM

## 2016-12-10 ENCOUNTER — Ambulatory Visit
Admission: RE | Admit: 2016-12-10 | Discharge: 2016-12-10 | Disposition: A | Discharge status: Home / Self Care | Payer: Medicare Other | Source: Ambulatory Visit | Attending: Nephrology | Admitting: Nephrology

## 2016-12-10 DIAGNOSIS — N189 Chronic kidney disease, unspecified: Secondary | ICD-10-CM | POA: Diagnosis not present

## 2016-12-10 DIAGNOSIS — N183 Chronic kidney disease, stage 3 unspecified: Secondary | ICD-10-CM

## 2016-12-11 ENCOUNTER — Other Ambulatory Visit: Payer: Self-pay | Admitting: Nurse Practitioner

## 2016-12-12 ENCOUNTER — Other Ambulatory Visit: Payer: Self-pay | Admitting: Nurse Practitioner

## 2016-12-12 DIAGNOSIS — I10 Essential (primary) hypertension: Secondary | ICD-10-CM

## 2016-12-17 ENCOUNTER — Other Ambulatory Visit: Payer: Self-pay | Admitting: Nurse Practitioner

## 2016-12-24 ENCOUNTER — Ambulatory Visit: Payer: Medicare Other | Admitting: Nurse Practitioner

## 2016-12-25 ENCOUNTER — Ambulatory Visit (INDEPENDENT_AMBULATORY_CARE_PROVIDER_SITE_OTHER): Payer: Medicare Other | Admitting: Nurse Practitioner

## 2016-12-25 ENCOUNTER — Encounter: Payer: Self-pay | Admitting: Nurse Practitioner

## 2016-12-25 ENCOUNTER — Telehealth: Payer: Self-pay | Admitting: *Deleted

## 2016-12-25 VITALS — BP 168/78 | HR 86 | Temp 98.3°F | Resp 17 | Ht 65.0 in | Wt 200.0 lb

## 2016-12-25 DIAGNOSIS — F325 Major depressive disorder, single episode, in full remission: Secondary | ICD-10-CM

## 2016-12-25 DIAGNOSIS — M545 Low back pain, unspecified: Secondary | ICD-10-CM

## 2016-12-25 DIAGNOSIS — N183 Chronic kidney disease, stage 3 unspecified: Secondary | ICD-10-CM

## 2016-12-25 DIAGNOSIS — I6523 Occlusion and stenosis of bilateral carotid arteries: Secondary | ICD-10-CM

## 2016-12-25 DIAGNOSIS — G8929 Other chronic pain: Secondary | ICD-10-CM | POA: Diagnosis not present

## 2016-12-25 DIAGNOSIS — I10 Essential (primary) hypertension: Secondary | ICD-10-CM

## 2016-12-25 DIAGNOSIS — K219 Gastro-esophageal reflux disease without esophagitis: Secondary | ICD-10-CM | POA: Diagnosis not present

## 2016-12-25 MED ORDER — HYDRALAZINE HCL 10 MG PO TABS: 10.0000 mg | ORAL_TABLET | Freq: Three times a day (TID) | ORAL | 1 refills | Status: DC

## 2016-12-25 MED ORDER — FAMOTIDINE 20 MG PO TABS: 20.0000 mg | ORAL_TABLET | Freq: Two times a day (BID) | ORAL | 1 refills | Status: DC

## 2016-12-25 NOTE — Progress Notes (Signed)
Careteam: Patient Care Team: [name], NP as PCP - General (Geriatric Medicine) Dustin Folks, MD as Consulting Physician (Hematology and Oncology) Joellyn Haff, MD as Consulting Physician (Obstetrics and Gynecology) Consuella Lose, MD as Consulting Physician (Neurosurgery)  Advanced Directive information    Allergies  Allergen Reactions  . Fish-Derived Products     shellfish  . Neomy-Bacit-Polymyx-Pramoxine     Not sure which antibiotic she has a reaction to    Chief Complaint  Patient presents with  . Follow-up    Pt is being seen for a 4 week follow up on medication changes. Pt was confused as to why she was changed.      HPI: Patient is a 82 y.o. female seen in the office today to follow up medication changes.  Pt has followed up wth nephrologist since last visit with medication changes as well. Recommended coming off of omeprazole and to start zantac or pepcid Zantac did not work at [employer] working some but not helping enough- reports nausea and increase GERD symptoms - taking 20 mg daily  Blood pressure not controlled and therefore losartan was increased to 50 mg by mouth twice daily.  Reports blood pressure is around the same at home.   pts buspar was on back order therefore medication had to be changed. Pt is confused about her medication. Reports she had been talking buspar and has not started zoloft yet.    Review of Systems:  Review of Systems  Constitutional: Negative for chills, fever, malaise/fatigue and weight loss.  HENT: Negative for congestion.   Eyes: Negative for blurred vision.  Respiratory: Positive for shortness of breath (with activity). Negative for cough.   Cardiovascular: Negative for chest pain, palpitations and leg swelling.  Gastrointestinal: Positive for heartburn. Negative for abdominal pain, blood in stool, constipation and melena.  Genitourinary: Negative for dysuria.  Musculoskeletal: Positive for back  pain and joint pain. Negative for falls.  Skin: Negative for itching and rash.  Neurological: Negative for dizziness, loss of consciousness, weakness and headaches.  Psychiatric/Behavioral: Positive for memory loss. Negative for depression. The patient is not nervous/anxious and does not have insomnia.     Past Medical History:  Diagnosis Date  . Anemia   . Anterolisthesis    grade 1: L3-4  . Anxiety   . Arthritis   . Breast cancer ([place])   . Chronic kidney disease   . Dementia   . Depression   . Gastroesophageal reflux disease   . Heartburn   . Hypertension   . Hypothyroidism   . Leg cramps   . Post-menopause bleeding   . Restless leg syndrome 10/07/2014   Past Surgical History:  Procedure Laterality Date  . BREAST SURGERY Left    Lumpectomy   . CATARACT EXTRACTION, BILATERAL    . hysteroscopy biopsy     Social History:   reports that she has quit smoking. She has a 2.00 pack-year smoking history. she has never used smokeless tobacco. She reports that she does not drink alcohol or use drugs.  Family History  Problem Relation Age of Onset  . Hypertension Mother   . Arthritis Mother   . Heart disease Mother   . Hypertension Father   . Heart disease Father   . Cancer Brother   . Breast cancer Paternal Aunt     Medications:   Medication List        Accurate as of 12/25/16  3:12 PM. Always use your most recent med  list.          amLODipine 10 MG tablet Commonly known as:  NORVASC TAKE 1 TABLET BY MOUTH EVERY DAY   aspirin EC 81 MG tablet   Cod Liver Oil Caps   CVS TRIPLE MAGNESIUM COMPLEX 400 MG Caps Generic drug:  Magnesium   CVS VITAMIN A 26378 UNIT capsule Generic drug:  vitamin A   donepezil 5 MG tablet Commonly known as:  ARICEPT TAKE 1 TABLET (5 MG TOTAL) BY MOUTH AT BEDTIME.   Garlic 10 MG Caps   levothyroxine 100 MCG tablet Commonly known as:  SYNTHROID, LEVOTHROID TAKE 1 TABLET (100 MCG TOTAL) BY MOUTH DAILY BEFORE BREAKFAST.     losartan 50 MG tablet Commonly known as:  COZAAR TAKE 1 TABLET BY MOUTH EVERY DAY   metoprolol succinate 50 MG 24 hr tablet Commonly known as:  TOPROL-XL TAKE 1 TABLET BY MOUTH DAILY WITH OR IMMEDIATELY FOLLOWING A MEAL   rOPINIRole 1 MG tablet Commonly known as:  REQUIP TAKE 1 TABLET (1 MG TOTAL) BY MOUTH AT BEDTIME.   sertraline 25 MG tablet Commonly known as:  ZOLOFT Take 1 tablet (25 mg total) daily by mouth.   traMADol 50 MG tablet Commonly known as:  ULTRAM Take 1 tablet (50 mg total) by mouth every 8 (eight) hours as needed.   traZODone 50 MG tablet Commonly known as:  DESYREL TAKE 1 TABLET (50 MG TOTAL) BY MOUTH AT BEDTIME.   TYLENOL 325 MG Caps Generic drug:  Acetaminophen        Physical Exam:  Vitals:   12/25/16 1504  BP: (!) 168/78  Pulse: 86  Resp: 17  Temp: 98.3 F (36.8 C)  TempSrc: Oral  SpO2: 97%  Weight: 200 lb (90.7 kg)  Height: 5\' 5"  (1.651 m)   Body mass index is 33.28 kg/m.  Physical Exam  Constitutional: She appears well-developed and well-nourished. No distress.  HENT:  Head: Normocephalic and atraumatic.  Mouth/Throat: Oropharynx is clear and moist.  Eyes: Conjunctivae are normal. Pupils are equal, round, and reactive to light.  Cardiovascular: Normal rate and regular rhythm.  Murmur heard. Pulmonary/Chest: Effort normal and breath sounds normal.  Abdominal: Soft. Bowel sounds are normal.  Musculoskeletal: She exhibits no edema or tenderness.  Neurological: She is alert.  Skin: Skin is warm and dry. She is not diaphoretic.  Psychiatric: She has a normal mood and affect.  Mild short term memory loss    Labs reviewed: Basic Metabolic Panel: Recent Labs    04/15/16 1447 05/27/16 2000 05/27/16 2020 09/25/16 0822  NA 139 138 142 139  K 4.4 4.1 4.2 4.7  CL 106 108 107 105  CO2 25 23  --  26  GLUCOSE 97 110* 110* 102*  BUN 22 11 12 20   CREATININE 1.83* 1.57* 1.60* 1.74*  CALCIUM 9.1 9.2  --  9.4  TSH 0.54  --   --    --    Liver Function Tests: Recent Labs    04/15/16 1447 05/27/16 2000 09/25/16 0822  AST 21 31 24   ALT 15 24 19   ALKPHOS 47 51  --   BILITOT 0.2 0.3 0.3  PROT 7.0 7.4 6.9  ALBUMIN 3.7 3.9  --    No results for input(s): LIPASE, AMYLASE in the last 8760 hours. No results for input(s): AMMONIA in the last 8760 hours. CBC: Recent Labs    04/15/16 1447 05/27/16 2000 05/27/16 2020 09/25/16 0822  WBC 5.0 4.6  --  3.7*  NEUTROABS  2,200 2.4  --  1,262*  HGB 10.8* 12.2 12.2 11.3*  HCT 33.6* 36.3 36.0 32.5*  MCV 93.1 92.1  --  89.5  PLT 261 236  --  237   Lipid Panel: Recent Labs    09/25/16 0822  CHOL 171  HDL 37*  TRIG 109  CHOLHDL 4.6   TSH: Recent Labs    04/15/16 1447  TSH 0.54   A1C: No results found for: HGBA1C   Assessment/Plan 1. Chronic midline low back pain without sciatica Pt very inactive due to back pain therefore easily short of breath. Discussed medication regimen to help better control pain, can use tramadol every 6 hours if needed for pain if tylenol not effective so she can be more physically active without having pain. Also recommended to cont exercises by PT which she has not done.  2. Essential hypertension -not controlled, encouraged dash diet -cont current regimen but will add hydralazine.  -to take blood pressure and record and bring to follow up visit  - hydrALAZINE (APRESOLINE) 10 MG tablet; Take 1 tablet (10 mg total) by mouth 3 (three) times daily.  Dispense: 90 tablet; Refill: 1  3. CKD (chronic kidney disease) stage 3, GFR 30-59 ml/min (HCC) -consulted with nephrologist, PPI stopped and medication adjusted for blood pressure. Encourage proper hydration and to avoid NSAIDS (Aleve, Advil, Motrin, Ibuprofen)   4. Gastroesophageal reflux disease without esophagitis -taken of PPI due to CKD - famotidine (PEPCID) 20 MG tablet; Take 1 tablet (20 mg total) by mouth 2 (two) times daily. For acid reflux  Dispense: 60 tablet; Refill: 1  5.  Major depressive disorder with single episode, in full remission ([place]) Has ran on of buspar but has not started zoloft  -plans to start zoloft at this time  Next appt: 1 month for mood and blood pressure Jessica K. Harle Battiest  [facility] [phone] 8 am - 5 pm) [phone] (after hours)

## 2016-12-25 NOTE — Telephone Encounter (Signed)
Patient called and stated that they were just here but forgot to ask for a refill on Tramadol. Needs Refill.  Pended Rx. [place] Database Verified.

## 2016-12-25 NOTE — Patient Instructions (Signed)
For BLOOD PRESSURE -add hydralazine 10 mg by mouth three times daily for blood pressure ~every 8 hours -continue all other medication as prescribed.  -DASH diet -to take blood pressure ~1 hour after you have taken your medication -make sure you have been sitting at least 5 minutes when you take blood pressure -record blood pressure and HR and bring to next office visit in 1 month  For Acid Reflux -to increase Famotine (pepcid) 20 mg by mouth to twice daily   For back pain May use tramadol during the day if needed  Try to increase activity slowly- goal is 30 mins 5 days a week.      DASH Eating Plan DASH stands for "Dietary Approaches to Stop Hypertension." The DASH eating plan is a healthy eating plan that has been shown to reduce high blood pressure (hypertension). It may also reduce your risk for type 2 diabetes, heart disease, and stroke. The DASH eating plan may also help with weight loss. What are tips for following this plan? General guidelines  Avoid eating more than 2,300 mg (milligrams) of salt (sodium) a day. If you have hypertension, you may need to reduce your sodium intake to 1,500 mg a day.  Limit alcohol intake to no more than 1 drink a day for nonpregnant women and 2 drinks a day for men. One drink equals 12 oz of beer, 5 oz of wine, or 1 oz of hard liquor.  Work with your health care provider to maintain a healthy body weight or to lose weight. Ask what an ideal weight is for you.  Get at least 30 minutes of exercise that causes your heart to beat faster (aerobic exercise) most days of the week. Activities may include walking, swimming, or biking.  Work with your health care provider or diet and nutrition specialist (dietitian) to adjust your eating plan to your individual calorie needs. Reading food labels  Check food labels for the amount of sodium per serving. Choose foods with less than 5 percent of the Daily Value of sodium. Generally, foods with less than  300 mg of sodium per serving fit into this eating plan.  To find whole grains, look for the word "whole" as the first word in the ingredient list. Shopping  Buy products labeled as "low-sodium" or "no salt added."  Buy fresh foods. Avoid canned foods and premade or frozen meals. Cooking  Avoid adding salt when cooking. Use salt-free seasonings or herbs instead of table salt or sea salt. Check with your health care provider or pharmacist before using salt substitutes.  Do not fry foods. Cook foods using healthy methods such as baking, boiling, grilling, and broiling instead.  Cook with heart-healthy oils, such as olive, canola, soybean, or sunflower oil. Meal planning   Eat a balanced diet that includes: ? 5 or more servings of fruits and vegetables each day. At each meal, try to fill half of your plate with fruits and vegetables. ? Up to 6-8 servings of whole grains each day. ? Less than 6 oz of lean meat, poultry, or fish each day. A 3-oz serving of meat is about the same size as a deck of cards. One egg equals 1 oz. ? 2 servings of low-fat dairy each day. ? A serving of nuts, seeds, or beans 5 times each week. ? Heart-healthy fats. Healthy fats called Omega-3 fatty acids are found in foods such as flaxseeds and coldwater fish, like sardines, salmon, and mackerel.  Limit how much you eat of  the following: ? Canned or prepackaged foods. ? Food that is high in trans fat, such as fried foods. ? Food that is high in saturated fat, such as fatty meat. ? Sweets, desserts, sugary drinks, and other foods with added sugar. ? Full-fat dairy products.  Do not salt foods before eating.  Try to eat at least 2 vegetarian meals each week.  Eat more home-cooked food and less restaurant, buffet, and fast food.  When eating at a restaurant, ask that your food be prepared with less salt or no salt, if possible. What foods are recommended? The items listed may not be a complete list. Talk with  your dietitian about what dietary choices are best for you. Grains Whole-grain or whole-wheat bread. Whole-grain or whole-wheat pasta. Brown rice. [place]. Bulgur. Whole-grain and low-sodium cereals. Pita bread. Low-fat, low-sodium crackers. Whole-wheat flour tortillas. Vegetables Fresh or frozen vegetables (raw, steamed, roasted, or grilled). Low-sodium or reduced-sodium tomato and vegetable juice. Low-sodium or reduced-sodium tomato sauce and tomato paste. Low-sodium or reduced-sodium canned vegetables. Fruits All fresh, dried, or frozen fruit. Canned fruit in natural juice (without added sugar). Meat and other protein foods Skinless chicken or [place]. Ground chicken or [place]. Pork with fat trimmed off. Fish and seafood. Egg whites. Dried beans, peas, or lentils. Unsalted nuts, nut butters, and seeds. Unsalted canned beans. Lean cuts of beef with fat trimmed off. Low-sodium, lean deli meat. Dairy Low-fat (1%) or fat-free (skim) milk. Fat-free, low-fat, or reduced-fat cheeses. Nonfat, low-sodium ricotta or cottage cheese. Low-fat or nonfat yogurt. Low-fat, low-sodium cheese. Fats and oils Soft margarine without trans fats. Vegetable oil. Low-fat, reduced-fat, or light mayonnaise and salad dressings (reduced-sodium). Canola, safflower, olive, soybean, and sunflower oils. Avocado. Seasoning and other foods Herbs. Spices. Seasoning mixes without salt. Unsalted popcorn and pretzels. Fat-free sweets. What foods are not recommended? The items listed may not be a complete list. Talk with your dietitian about what dietary choices are best for you. Grains Baked goods made with fat, such as croissants, muffins, or some breads. Dry pasta or rice meal packs. Vegetables Creamed or fried vegetables. Vegetables in a cheese sauce. Regular canned vegetables (not low-sodium or reduced-sodium). Regular canned tomato sauce and paste (not low-sodium or reduced-sodium). Regular tomato and vegetable juice  (not low-sodium or reduced-sodium). Angie Fava. Olives. Fruits Canned fruit in a light or heavy syrup. Fried fruit. Fruit in cream or butter sauce. Meat and other protein foods Fatty cuts of meat. Ribs. Fried meat. Berniece Salines. Sausage. Bologna and other processed lunch meats. Salami. Fatback. Hotdogs. Bratwurst. Salted nuts and seeds. Canned beans with added salt. Canned or smoked fish. Whole eggs or egg yolks. Chicken or [place] with skin. Dairy Whole or 2% milk, cream, and half-and-half. Whole or full-fat cream cheese. Whole-fat or sweetened yogurt. Full-fat cheese. Nondairy creamers. Whipped toppings. Processed cheese and cheese spreads. Fats and oils Butter. Stick margarine. Lard. Shortening. Ghee. Bacon fat. Tropical oils, such as coconut, palm kernel, or palm oil. Seasoning and other foods Salted popcorn and pretzels. Onion salt, garlic salt, seasoned salt, table salt, and sea salt. Worcestershire sauce. Tartar sauce. Barbecue sauce. Teriyaki sauce. Soy sauce, including reduced-sodium. Steak sauce. Canned and packaged gravies. Fish sauce. Oyster sauce. Cocktail sauce. Horseradish that you find on the shelf. Ketchup. Mustard. Meat flavorings and tenderizers. Bouillon cubes. Hot sauce and Tabasco sauce. Premade or packaged marinades. Premade or packaged taco seasonings. Relishes. Regular salad dressings. Where to find more information:  National Heart, Lung, and [place]: https://wilson-eaton.com/  American Heart Association:  www.heart.org Summary  The DASH eating plan is a healthy eating plan that has been shown to reduce high blood pressure (hypertension). It may also reduce your risk for type 2 diabetes, heart disease, and stroke.  With the DASH eating plan, you should limit salt (sodium) intake to 2,300 mg a day. If you have hypertension, you may need to reduce your sodium intake to 1,500 mg a day.  When on the DASH eating plan, aim to eat more fresh fruits and vegetables, whole grains, lean  proteins, low-fat dairy, and heart-healthy fats.  Work with your health care provider or diet and nutrition specialist (dietitian) to adjust your eating plan to your individual calorie needs. This information is not intended to replace advice given to you by your health care provider. Make sure you discuss any questions you have with your health care provider. Document Released: 12/20/2010 Document Revised: 12/25/2015 Document Reviewed: 12/25/2015 Elsevier Interactive Patient Education  2017 [employer].

## 2016-12-26 ENCOUNTER — Telehealth: Payer: Self-pay | Admitting: *Deleted

## 2016-12-26 MED ORDER — TRAMADOL HCL 50 MG PO TABS: 50.0000 mg | ORAL_TABLET | Freq: Three times a day (TID) | ORAL | 0 refills | Status: DC | PRN

## 2016-12-26 NOTE — Telephone Encounter (Signed)
Patient called and stated that the pharmacy gave her Buspar yesterday. Patient has not started the Zoloft yet and would like to discontinue the Zoloft and continue taking the Buspar 7.5mg  twice daily. Please Advise.

## 2016-12-26 NOTE — Telephone Encounter (Signed)
Patient notified and agreed. Medication list updated.  

## 2016-12-26 NOTE — Telephone Encounter (Signed)
Noted, thank you

## 2016-12-26 NOTE — Telephone Encounter (Signed)
If they have a supply at the pharmacy that is fine but it was our understanding this was backordered.  If this is the case to change medication list

## 2016-12-31 ENCOUNTER — Telehealth: Payer: Self-pay | Admitting: *Deleted

## 2016-12-31 MED ORDER — LOSARTAN POTASSIUM 50 MG PO TABS: 50.0000 mg | ORAL_TABLET | Freq: Two times a day (BID) | ORAL | 3 refills | Status: DC

## 2016-12-31 NOTE — Telephone Encounter (Signed)
Yes, please refill at losartan 50 mg by mouth twice daily thank you

## 2016-12-31 NOTE — Telephone Encounter (Signed)
Patient called requesting a refill on her Losartan. Patient stated that it has been doubled per her kidney Dr. Edythe Clarity Losartan 50 mg twice daily I reviewed Medication List and it still only states Losartan 50mg  daily. I reviewed last OV note 12/25/16 and the HPI stated "Blood pressure not controlled and therefore Losartan was increased to 50mg  by mouth twice daily" but this was not in the current medication list. Is this ok to update and refill. Please Advise.

## 2016-12-31 NOTE — Telephone Encounter (Signed)
Medication list updated and refill faxed to pharmacy.

## 2017-01-02 ENCOUNTER — Other Ambulatory Visit: Payer: Self-pay | Admitting: Nurse Practitioner

## 2017-01-03 DIAGNOSIS — F419 Anxiety disorder, unspecified: Secondary | ICD-10-CM | POA: Diagnosis not present

## 2017-01-03 DIAGNOSIS — K219 Gastro-esophageal reflux disease without esophagitis: Secondary | ICD-10-CM | POA: Diagnosis not present

## 2017-01-03 DIAGNOSIS — D631 Anemia in chronic kidney disease: Secondary | ICD-10-CM | POA: Diagnosis not present

## 2017-01-03 DIAGNOSIS — E039 Hypothyroidism, unspecified: Secondary | ICD-10-CM | POA: Diagnosis not present

## 2017-01-03 DIAGNOSIS — N183 Chronic kidney disease, stage 3 (moderate): Secondary | ICD-10-CM | POA: Diagnosis not present

## 2017-01-03 DIAGNOSIS — I739 Peripheral vascular disease, unspecified: Secondary | ICD-10-CM | POA: Diagnosis not present

## 2017-01-03 DIAGNOSIS — C50919 Malignant neoplasm of unspecified site of unspecified female breast: Secondary | ICD-10-CM | POA: Diagnosis not present

## 2017-01-03 DIAGNOSIS — F329 Major depressive disorder, single episode, unspecified: Secondary | ICD-10-CM | POA: Diagnosis not present

## 2017-01-03 DIAGNOSIS — N390 Urinary tract infection, site not specified: Secondary | ICD-10-CM | POA: Diagnosis not present

## 2017-01-03 DIAGNOSIS — I129 Hypertensive chronic kidney disease with stage 1 through stage 4 chronic kidney disease, or unspecified chronic kidney disease: Secondary | ICD-10-CM | POA: Diagnosis not present

## 2017-01-03 DIAGNOSIS — N2581 Secondary hyperparathyroidism of renal origin: Secondary | ICD-10-CM | POA: Diagnosis not present

## 2017-01-03 DIAGNOSIS — G2581 Restless legs syndrome: Secondary | ICD-10-CM | POA: Diagnosis not present

## 2017-01-13 ENCOUNTER — Ambulatory Visit: Payer: Self-pay | Admitting: Nurse Practitioner

## 2017-01-14 HISTORY — PX: OTHER SURGICAL HISTORY: SHX169

## 2017-01-22 ENCOUNTER — Encounter: Payer: Self-pay | Admitting: Nurse Practitioner

## 2017-01-22 ENCOUNTER — Ambulatory Visit (INDEPENDENT_AMBULATORY_CARE_PROVIDER_SITE_OTHER): Payer: Medicare Other | Admitting: Nurse Practitioner

## 2017-01-22 VITALS — BP 132/82 | HR 70 | Temp 98.3°F | Resp 18 | Ht 65.0 in | Wt 193.0 lb

## 2017-01-22 DIAGNOSIS — N183 Chronic kidney disease, stage 3 unspecified: Secondary | ICD-10-CM

## 2017-01-22 DIAGNOSIS — M545 Low back pain, unspecified: Secondary | ICD-10-CM

## 2017-01-22 DIAGNOSIS — F325 Major depressive disorder, single episode, in full remission: Secondary | ICD-10-CM

## 2017-01-22 DIAGNOSIS — I10 Essential (primary) hypertension: Secondary | ICD-10-CM

## 2017-01-22 DIAGNOSIS — Z23 Encounter for immunization: Secondary | ICD-10-CM

## 2017-01-22 DIAGNOSIS — G8929 Other chronic pain: Secondary | ICD-10-CM | POA: Diagnosis not present

## 2017-01-22 DIAGNOSIS — K219 Gastro-esophageal reflux disease without esophagitis: Secondary | ICD-10-CM | POA: Diagnosis not present

## 2017-01-22 DIAGNOSIS — E039 Hypothyroidism, unspecified: Secondary | ICD-10-CM

## 2017-01-22 DIAGNOSIS — G4720 Circadian rhythm sleep disorder, unspecified type: Secondary | ICD-10-CM

## 2017-01-22 LAB — BASIC METABOLIC PANEL WITH GFR
BUN/Creatinine Ratio: 12 (calc) (ref 6–22)
BUN: 26 mg/dL — ABNORMAL HIGH (ref 7–25)
CO2: 27 mmol/L (ref 20–32)
Calcium: 9.5 mg/dL (ref 8.6–10.4)
Chloride: 101 mmol/L (ref 98–110)
Creat: 2.23 mg/dL — ABNORMAL HIGH (ref 0.60–0.88)
GFR, Est African American: 23 mL/min/{1.73_m2} — ABNORMAL LOW (ref >=60)
GFR, Est Non African American: 20 mL/min/{1.73_m2} — ABNORMAL LOW (ref >=60)
Glucose, Bld: 99 mg/dL (ref 65–139)
Potassium: 4.7 mmol/L (ref 3.5–5.3)
Sodium: 136 mmol/L (ref 135–146)

## 2017-01-22 LAB — CBC WITH DIFFERENTIAL/PLATELET
Basophils Absolute: 30 cells/uL (ref 0–200)
Basophils Relative: 0.6 %
Eosinophils Absolute: 70 cells/uL (ref 15–500)
Eosinophils Relative: 1.4 %
HCT: 32.1 % — ABNORMAL LOW (ref 35.0–45.0)
Hemoglobin: 11 g/dL — ABNORMAL LOW (ref 11.7–15.5)
Lymphs Abs: 2275 cells/uL (ref 850–3900)
MCH: 30.5 pg (ref 27.0–33.0)
MCHC: 34.3 g/dL (ref 32.0–36.0)
MCV: 88.9 fL (ref 80.0–100.0)
MPV: 10.8 fL (ref 7.5–12.5)
Monocytes Relative: 8.4 %
Neutro Abs: 2205 cells/uL (ref 1500–7800)
Neutrophils Relative %: 44.1 %
Platelets: 250 10*3/uL (ref 140–400)
RBC: 3.61 10*6/uL — ABNORMAL LOW (ref 3.80–5.10)
RDW: 13.4 % (ref 11.0–15.0)
Total Lymphocyte: 45.5 %
WBC mixed population: 420 cells/uL (ref 200–950)
WBC: 5 10*3/uL (ref 3.8–10.8)

## 2017-01-22 LAB — TSH: TSH: 1.93 mIU/L (ref 0.40–4.50)

## 2017-01-22 NOTE — Patient Instructions (Addendum)
Melatonin 3 mg by mouth daily at bedtime  Minimize naps during the day No caffeine close to bedtime Continue activity during the day Bedtime routines are important.  Minimize lighting in room.  Only get in to the bed to sleep   Follow up with Dr Kathyrn Sheriff @ [facility]  Can use tylenol 500 mg 2 tablets every 8 hours as needed for pain Can also use tramadol with this.   Food Choices for Gastroesophageal Reflux Disease, Adult When you have gastroesophageal reflux disease (GERD), the foods you eat and your eating habits are very important. Choosing the right foods can help ease your discomfort. What guidelines do I need to follow?  Choose fruits, vegetables, whole grains, and low-fat dairy products.  Choose low-fat meat, fish, and poultry.  Limit fats such as oils, salad dressings, butter, nuts, and avocado.  Keep a food diary. This helps you identify foods that cause symptoms.  Avoid foods that cause symptoms. These may be different for everyone.  Eat small meals often instead of 3 large meals a day.  Eat your meals slowly, in a place where you are relaxed.  Limit fried foods.  Cook foods using methods other than frying.  Avoid drinking alcohol.  Avoid drinking large amounts of liquids with your meals.  Avoid bending over or lying down until 2-3 hours after eating. What foods are not recommended? These are some foods and drinks that may make your symptoms worse: Vegetables Tomatoes. Tomato juice. Tomato and spaghetti sauce. Chili peppers. Onion and garlic. Horseradish. Fruits Oranges, grapefruit, and lemon (fruit and juice). Meats High-fat meats, fish, and poultry. This includes hot dogs, ribs, ham, sausage, salami, and bacon. Dairy Whole milk and chocolate milk. Sour cream. Cream. Butter. Ice cream. Cream cheese. Drinks Coffee and tea. Bubbly (carbonated) drinks or energy drinks. Condiments Hot sauce. Barbecue sauce. Sweets/Desserts Chocolate  and cocoa. Donuts. Peppermint and spearmint. Fats and Oils High-fat foods. This includes [place] fries and potato chips. Other Vinegar. Strong spices. This includes black pepper, white pepper, red pepper, cayenne, curry powder, cloves, ginger, and chili powder. The items listed above may not be a complete list of foods and drinks to avoid. Contact your dietitian for more information. This information is not intended to replace advice given to you by your health care provider. Make sure you discuss any questions you have with your health care provider. Document Released: 07/02/2011 Document Revised: 06/08/2015 Document Reviewed: 11/04/2012 Elsevier Interactive Patient Education  2017 [employer].

## 2017-01-22 NOTE — Progress Notes (Signed)
Careteam: Patient Care Team: [name], NP as PCP - General (Geriatric Medicine) Dustin Folks, MD as Consulting Physician (Hematology and Oncology) Joellyn Haff, MD as Consulting Physician (Obstetrics and Gynecology) Consuella Lose, MD as Consulting Physician (Neurosurgery)  Advanced Directive information    Allergies  Allergen Reactions  . Fish-Derived Products     shellfish  . Neomy-Bacit-Polymyx-Pramoxine     Not sure which antibiotic she has a reaction to    Chief Complaint  Patient presents with  . Follow-up    Pt is being seen for a 4 week follow up on blood pressure, mood, and GERD.      HPI: Patient is a 83 y.o. female seen in the office today for follow up on HTN, depression and GERD  Depression/anxiety- pharmacy called and stated buspar was on back ordered and needed alterative therefore zoloft was prescribed but pt kept receiving buspar and got refills.   Ongoing pain in her lower back- saw neurosurgery who recommended NSAID but she could not take due to CKD, she has not followed up with them since.   HTN- blood pressure has been good at home.   Reports no energy and weak. Has been going on for a few months but getting worse. Good appetite. Taking more time to do the things she needs to do. Having a lot of pain as well. Feels like pain is more why she is weak.   Sleep- taking trazodone for sleep but it is not helping. Getting only about 2 hours then can not go back to sleep.  No increase in anxiety or stress.  Minimal activity during the day but started doing exercises.  GERD- still having trouble with increase acid, has tired to cut foods which have been beneficial    Review of Systems:  Review of Systems  Constitutional: Negative for chills, fever, malaise/fatigue and weight loss.  HENT: Negative for congestion.   Eyes: Negative for blurred vision.  Respiratory: Positive for shortness of breath (with activity). Negative for  cough.   Cardiovascular: Negative for chest pain, palpitations and leg swelling.  Gastrointestinal: Positive for heartburn. Negative for abdominal pain, blood in stool, constipation and melena.  Genitourinary: Negative for dysuria.  Musculoskeletal: Positive for back pain and joint pain. Negative for falls.  Skin: Negative for itching and rash.  Neurological: Negative for dizziness, loss of consciousness, weakness and headaches.  Psychiatric/Behavioral: Positive for memory loss. Negative for depression. The patient is not nervous/anxious and does not have insomnia.     Past Medical History:  Diagnosis Date  . Anemia   . Anterolisthesis    grade 1: L3-4  . Anxiety   . Arthritis   . Breast cancer ([place])   . Chronic kidney disease   . Dementia   . Depression   . Gastroesophageal reflux disease   . Heartburn   . Hypertension   . Hypothyroidism   . Leg cramps   . Post-menopause bleeding   . Restless leg syndrome 10/07/2014   Past Surgical History:  Procedure Laterality Date  . BREAST SURGERY Left    Lumpectomy   . CATARACT EXTRACTION, BILATERAL    . hysteroscopy biopsy     Social History:   reports that she has quit smoking. She has a 2.00 pack-year smoking history. she has never used smokeless tobacco. She reports that she does not drink alcohol or use drugs.  Family History  Problem Relation Age of Onset  . Hypertension Mother   . Arthritis Mother   .  Heart disease Mother   . Hypertension Father   . Heart disease Father   . Cancer Brother   . Breast cancer Paternal Aunt     Medications:   Medication List        Accurate as of 01/22/17  2:40 PM. Always use your most recent med list.          amLODipine 10 MG tablet Commonly known as:  NORVASC TAKE 1 TABLET BY MOUTH EVERY DAY   aspirin EC 81 MG tablet   busPIRone 7.5 MG tablet Commonly known as:  BUSPAR   Cod Liver Oil Caps   CVS TRIPLE MAGNESIUM COMPLEX 400 MG Caps Generic drug:  Magnesium   CVS  VITAMIN A 39030 UNIT capsule Generic drug:  vitamin A   donepezil 5 MG tablet Commonly known as:  ARICEPT TAKE 1 TABLET (5 MG TOTAL) BY MOUTH AT BEDTIME.   famotidine 20 MG tablet Commonly known as:  PEPCID Take 1 tablet (20 mg total) by mouth 2 (two) times daily. For acid reflux   Garlic 10 MG Caps   hydrALAZINE 10 MG tablet Commonly known as:  APRESOLINE Take 1 tablet (10 mg total) by mouth 3 (three) times daily.   levothyroxine 100 MCG tablet Commonly known as:  SYNTHROID, LEVOTHROID TAKE 1 TABLET (100 MCG TOTAL) BY MOUTH DAILY BEFORE BREAKFAST.   losartan 50 MG tablet Commonly known as:  COZAAR Take 1 tablet (50 mg total) by mouth 2 (two) times daily.   metoprolol succinate 50 MG 24 hr tablet Commonly known as:  TOPROL-XL TAKE 1 TABLET BY MOUTH DAILY WITH OR IMMEDIATELY FOLLOWING A MEAL   rOPINIRole 1 MG tablet Commonly known as:  REQUIP TAKE 1 TABLET (1 MG TOTAL) BY MOUTH AT BEDTIME.   traMADol 50 MG tablet Commonly known as:  ULTRAM Take 1 tablet (50 mg total) by mouth every 8 (eight) hours as needed.   traZODone 50 MG tablet Commonly known as:  DESYREL TAKE 1 TABLET (50 MG TOTAL) BY MOUTH AT BEDTIME.   TYLENOL 325 MG Caps Generic drug:  Acetaminophen        Physical Exam:  Vitals:   01/22/17 1433  BP: 132/82  Pulse: 70  Resp: 18  Temp: 98.3 F (36.8 C)  TempSrc: Oral  SpO2: 97%  Weight: 193 lb (87.5 kg)  Height: '5\' 5"'$  (1.651 m)   Body mass index is 32.12 kg/m.  Physical Exam  Constitutional: She appears well-developed and well-nourished. No distress.  HENT:  Head: Normocephalic and atraumatic.  Mouth/Throat: Oropharynx is clear and moist.  Eyes: Conjunctivae are normal. Pupils are equal, round, and reactive to light.  Cardiovascular: Normal rate and regular rhythm.  Murmur heard. Pulmonary/Chest: Effort normal and breath sounds normal.  Abdominal: Soft. Bowel sounds are normal.  Musculoskeletal: She exhibits no edema or tenderness.    Neurological: She is alert.  Skin: Skin is warm and dry. She is not diaphoretic.  Psychiatric: She has a normal mood and affect.  Mild short term memory loss    Labs reviewed: Basic Metabolic Panel: Recent Labs    04/15/16 1447 05/27/16 2000 05/27/16 2020 09/25/16 0822  NA 139 138 142 139  K 4.4 4.1 4.2 4.7  CL 106 108 107 105  CO2 25 23  --  26  GLUCOSE 97 110* 110* 102*  BUN '22 11 12 20  '$ CREATININE 1.83* 1.57* 1.60* 1.74*  CALCIUM 9.1 9.2  --  9.4  TSH 0.54  --   --   --  Liver Function Tests: Recent Labs    04/15/16 1447 05/27/16 2000 09/25/16 0822  AST '21 31 24  '$ ALT '15 24 19  '$ ALKPHOS 47 51  --   BILITOT 0.2 0.3 0.3  PROT 7.0 7.4 6.9  ALBUMIN 3.7 3.9  --    No results for input(s): LIPASE, AMYLASE in the last 8760 hours. No results for input(s): AMMONIA in the last 8760 hours. CBC: Recent Labs    04/15/16 1447 05/27/16 2000 05/27/16 2020 09/25/16 0822  WBC 5.0 4.6  --  3.7*  NEUTROABS 2,200 2.4  --  1,262*  HGB 10.8* 12.2 12.2 11.3*  HCT 33.6* 36.3 36.0 32.5*  MCV 93.1 92.1  --  89.5  PLT 261 236  --  237   Lipid Panel: Recent Labs    09/25/16 0822  CHOL 171  HDL 37*  TRIG 109  CHOLHDL 4.6   TSH: Recent Labs    04/15/16 1447  TSH 0.54   A1C: No results found for: HGBA1C   Assessment/Plan 1. Gastroesophageal reflux disease without esophagitis -encouraged diet modifications along with pepcid.   2. CKD (chronic kidney disease) stage 3, GFR 30-59 ml/min (HCC) -following with nephrologist, Encourage proper hydration and to avoid NSAIDS (Aleve, Advil, Motrin, Ibuprofen)   - BMP with eGFR  3. Essential hypertension -has improved with current regimen, to cont all medication and DASH diet.  - CBC with Differential/Platelets  4. Major depressive disorder with single episode, in full remission ([place]) Stable on current regimen.   5. Chronic midline low back pain without sciatica Ongoing, never followed up with neuro spine center after  initial visit. Now with persistent ongoing pain. Encouraged to follow up with them at this time. -cont tylenol and tramdol prn at this time   6. Hypothyroidism, unspecified type -on synthroid 100 mcg, will follow up TSH to make sure this is in normal range due to increase fatigue.  - TSH  7. Need for immunization against influenza - Flu vaccine HIGH DOSE PF (Fluzone High dose)  8. Sleep wake cycle disorder Melatonin 3 mg by mouth daily at bedtime  Minimize naps during the day No caffeine close to bedtime Continue activity during the day Bedtime routines are important.  Minimize lighting in room.  Only get in to the bed to sleep   Follow up: 4 months sooner if needed  Paola Aleshire K. Harle Battiest  [facility] [phone] 8 am - 5 pm) [phone] (after hours)

## 2017-01-29 ENCOUNTER — Telehealth: Payer: Self-pay

## 2017-01-29 DIAGNOSIS — M545 Low back pain, unspecified: Secondary | ICD-10-CM

## 2017-01-29 DIAGNOSIS — G8929 Other chronic pain: Secondary | ICD-10-CM

## 2017-01-29 NOTE — Telephone Encounter (Signed)
Patient's daughter called to ask if patient can be referred to another ortho for her back pain. She would like a second opinion for treatment options.

## 2017-01-29 NOTE — Telephone Encounter (Signed)
Orthopedic referral placed for second opinion

## 2017-01-29 NOTE — Telephone Encounter (Signed)
I spoke with patient's daughter and she verbalized understanding. She stated that she would await call to schedule appointment.

## 2017-02-05 ENCOUNTER — Telehealth: Payer: Self-pay

## 2017-02-05 NOTE — Telephone Encounter (Signed)
Patient called c/o dryness and itching. Patient thinks this is coming from hydralazine  Patient also questions if the combination of hydralazine and furosemide (prescribed by kidney specialist- Dr.Detering) can relate to her concerns.  Side note- patient was unable to provide the dose of furosemide, patient states she took the pills out of the bottle and put them in her pill box    Please advise

## 2017-02-06 NOTE — Telephone Encounter (Signed)
Patient notified and agreed.  

## 2017-02-06 NOTE — Telephone Encounter (Signed)
Kidney disease can cause itching; to use anti-itch lotion- gold bond, Eucerin

## 2017-02-08 ENCOUNTER — Other Ambulatory Visit: Payer: Self-pay | Admitting: Nurse Practitioner

## 2017-02-08 DIAGNOSIS — K219 Gastro-esophageal reflux disease without esophagitis: Secondary | ICD-10-CM

## 2017-02-08 DIAGNOSIS — I10 Essential (primary) hypertension: Secondary | ICD-10-CM

## 2017-02-10 ENCOUNTER — Other Ambulatory Visit: Payer: Self-pay | Admitting: Nurse Practitioner

## 2017-02-13 DIAGNOSIS — N183 Chronic kidney disease, stage 3 (moderate): Secondary | ICD-10-CM | POA: Diagnosis not present

## 2017-02-13 DIAGNOSIS — I129 Hypertensive chronic kidney disease with stage 1 through stage 4 chronic kidney disease, or unspecified chronic kidney disease: Secondary | ICD-10-CM | POA: Diagnosis not present

## 2017-02-17 DIAGNOSIS — Z853 Personal history of malignant neoplasm of breast: Secondary | ICD-10-CM | POA: Diagnosis not present

## 2017-02-19 ENCOUNTER — Ambulatory Visit: Payer: Medicare Other | Admitting: Nurse Practitioner

## 2017-02-20 ENCOUNTER — Ambulatory Visit (INDEPENDENT_AMBULATORY_CARE_PROVIDER_SITE_OTHER): Payer: Medicare Other | Admitting: Orthopedic Surgery

## 2017-02-22 ENCOUNTER — Other Ambulatory Visit: Payer: Self-pay | Admitting: Nurse Practitioner

## 2017-03-07 ENCOUNTER — Other Ambulatory Visit: Payer: Self-pay | Admitting: *Deleted

## 2017-03-07 MED ORDER — LOSARTAN POTASSIUM 50 MG PO TABS: 50.0000 mg | ORAL_TABLET | Freq: Every day | ORAL | 1 refills | Status: DC

## 2017-03-07 NOTE — Telephone Encounter (Signed)
[facility] requested 90 day supply to be sent to patient's pharmacy.

## 2017-03-09 ENCOUNTER — Other Ambulatory Visit: Payer: Self-pay | Admitting: Nurse Practitioner

## 2017-03-09 DIAGNOSIS — F325 Major depressive disorder, single episode, in full remission: Secondary | ICD-10-CM

## 2017-03-15 ENCOUNTER — Other Ambulatory Visit: Payer: Self-pay | Admitting: Nurse Practitioner

## 2017-03-15 DIAGNOSIS — M545 Low back pain, unspecified: Secondary | ICD-10-CM

## 2017-03-15 DIAGNOSIS — G8929 Other chronic pain: Secondary | ICD-10-CM

## 2017-03-17 NOTE — Telephone Encounter (Signed)
A medication refill was received from pharmacy for tramadol 50 mg. Rx was called in to pharmacy after verifying last fill date, provider, and quantity on [place] database.   

## 2017-03-21 ENCOUNTER — Encounter (INDEPENDENT_AMBULATORY_CARE_PROVIDER_SITE_OTHER): Payer: Self-pay | Admitting: Orthopaedic Surgery

## 2017-03-21 ENCOUNTER — Ambulatory Visit (INDEPENDENT_AMBULATORY_CARE_PROVIDER_SITE_OTHER): Payer: Medicare Other | Admitting: Orthopaedic Surgery

## 2017-03-21 ENCOUNTER — Other Ambulatory Visit: Payer: Self-pay | Admitting: Internal Medicine

## 2017-03-21 VITALS — BP 150/56 | HR 67 | Ht 64.0 in | Wt 189.0 lb

## 2017-03-21 DIAGNOSIS — M48062 Spinal stenosis, lumbar region with neurogenic claudication: Secondary | ICD-10-CM | POA: Diagnosis not present

## 2017-03-25 ENCOUNTER — Other Ambulatory Visit: Payer: Self-pay | Admitting: Nurse Practitioner

## 2017-03-25 ENCOUNTER — Encounter (INDEPENDENT_AMBULATORY_CARE_PROVIDER_SITE_OTHER): Payer: Self-pay | Admitting: Orthopaedic Surgery

## 2017-03-25 DIAGNOSIS — F325 Major depressive disorder, single episode, in full remission: Secondary | ICD-10-CM

## 2017-03-25 DIAGNOSIS — M48062 Spinal stenosis, lumbar region with neurogenic claudication: Secondary | ICD-10-CM | POA: Insufficient documentation

## 2017-03-25 NOTE — Progress Notes (Signed)
Office Visit Note   Patient: Regina Porter           Date of Birth: 15-May-1934           MRN: [id number] Visit Date: 03/21/2017              Requested by: [name], NP [place], [place] [id number] PCP: [name], NP   Assessment & Plan: Visit Diagnoses:  1. Spinal stenosis of lumbar region with neurogenic claudication     Plan: MRI scan is reviewed with the patient and her daughter.  She has severe stenosis at L3-4 with instability shifting on flexion extension x-rays of 3 mm.  I discussed with her options include continue use of cane and walker versus decompression and fusion.  We discussed pathophysiology of spinal stenosis.  Moderate stenosis at L4-5 which could potentially progress with time.  I discussed with her I agree with previous surgical recommendation for single level fusion after decompression at the L3-4 level.  Follow-Up Instructions: No Follow-up on file.   Orders:  No orders of the defined types were placed in this encounter.  No orders of the defined types were placed in this encounter.     Procedures: No procedures performed   Clinical Data: No additional findings.   Subjective: Chief Complaint  Patient presents with  . Lower Back - Pain    HPI 83 year old female seen for second opinion with chronic back pain.  She states the pain starts below the shoulder blades and extends down the lumbar spine.  She has pain in both legs right equal left.  She denies numbness or tingling.  States onset was several years ago no known injury.  Been through physical therapy tried epidural injections without relief.  She is used Tylenol extra strength and also tramadol with some relief.  No bowel or bladder symptoms, no fever or chills.  Lumbar MRI scan 06/25/2016 showed grade 1 anterolisthesis with severe spinal stenosis.  Marked subarticular stenosis.  Moderate stenosis at L4-5.  She has pain with standing pain when she walks and states she can  walk 20-50 feet and then has to stop and sit down.  She gets relief leaning forward and now does not go to the grocery store.  Her daughter is present with her today.  Single level decompression and fusion is been discussed with her by Dr. Kathyrn Sheriff.  She has 3 mm of shift on lateral flexion-extension lumbar x-rays at the L3-4 level.  Patient has been on anti-inflammatories without relief.  Patient is here for second opinion concerning recommendations for surgery with single level decompression and fusion at L3-4.  Review of Systems positive for urinary frequency, hypothyroidism, hypertension, depression, early dementia, history of breast cancer, left carotid bruit.  Bilateral carotid artery stenosis.  Lumbar stenosis with anterolisthesis at L3-4.  Stage III kidney disease.  Positive for neurogenic claudication.   Objective: Vital Signs: BP (!) 150/56   Pulse 67   Ht 5\' 4"  (1.626 m)   Wt 189 lb (85.7 kg)   BMI 32.44 kg/m   Physical Exam  Constitutional: She is oriented to person, place, and time. She appears well-developed.  HENT:  Head: Normocephalic.  Right Ear: External ear normal.  Left Ear: External ear normal.  Eyes: Pupils are equal, round, and reactive to light.  Neck: No tracheal deviation present. No thyromegaly present.  Cardiovascular: Normal rate.  Pulmonary/Chest: Effort normal.  Abdominal: Soft.  Neurological: She is alert and oriented to  person, place, and time.  Skin: Skin is warm and dry.  Psychiatric: She has a normal mood and affect. Her behavior is normal.    Ortho Exam patient ambulates with a cane.  Gastrocsoleus, anterior tib quads hamstrings no deficit.  Knee and ankle jerk 1+ and symmetrical.  Negative logroll to the hips.  Good cervical range of motion negative or meet.  Specialty Comments:  No specialty comments available.  Imaging: No results found.   PMFS History: Patient Active Problem List   Diagnosis Date Noted  . Bilateral carotid artery  stenosis 08/30/2016  . Chronic midline low back pain without sciatica 08/30/2016  . CKD (chronic kidney disease) stage 3, GFR 30-59 ml/min (HCC) 08/30/2016  . Palpitations 04/22/2016  . Left carotid bruit 04/22/2016  . Hypothyroidism   . Hypertension   . Heartburn   . Depression   . Dementia   . Breast cancer ([place])   . Muscle spasm of back 12/19/2014  . Urinary frequency 12/19/2014   Past Medical History:  Diagnosis Date  . Anemia   . Anterolisthesis    grade 1: L3-4  . Anxiety   . Arthritis   . Breast cancer ([place])   . Chronic kidney disease   . Dementia   . Depression   . Gastroesophageal reflux disease   . Heartburn   . Hypertension   . Hypothyroidism   . Leg cramps   . Post-menopause bleeding   . Restless leg syndrome 10/07/2014    Family History  Problem Relation Age of Onset  . Hypertension Mother   . Arthritis Mother   . Heart disease Mother   . Hypertension Father   . Heart disease Father   . Cancer Brother   . Breast cancer Paternal Aunt     Past Surgical History:  Procedure Laterality Date  . BREAST SURGERY Left    Lumpectomy   . CATARACT EXTRACTION, BILATERAL    . hysteroscopy biopsy     Social History   Occupational History  . Occupation: Retired  Tobacco Use  . Smoking status: Former Smoker    Packs/day: 0.10    Years: 20.00    Pack years: 2.00  . Smokeless tobacco: Never Used  Substance and Sexual Activity  . Alcohol use: No    Alcohol/week: 0.0 oz  . Drug use: No  . Sexual activity: No

## 2017-03-26 ENCOUNTER — Telehealth: Payer: Self-pay | Admitting: *Deleted

## 2017-03-26 NOTE — Telephone Encounter (Signed)
Patient called and left message on Clinical intake stating that she wanted to know why her medication was not approved/refilled.  (not sure what medication she is referring to)  I tried calling patient back and left message on voicemail to return call. Awaiting RC.

## 2017-03-27 NOTE — Telephone Encounter (Signed)
Patient was calling about Sertraline being denied. I reviewed last OV note and patient was to continue with the Buspar since she was receiving it. Will continue with Buspar. Patient understood and agreed.

## 2017-04-01 ENCOUNTER — Telehealth (INDEPENDENT_AMBULATORY_CARE_PROVIDER_SITE_OTHER): Payer: Self-pay | Admitting: Orthopaedic Surgery

## 2017-04-01 NOTE — Telephone Encounter (Signed)
Please advise 

## 2017-04-01 NOTE — Telephone Encounter (Signed)
Patient's daughter Regina Porter calling to schedule back surgery for her. I don't have a surgery sheet.

## 2017-04-04 NOTE — Telephone Encounter (Signed)
Debbie, see message from Dr. Lorin Mercy.  I will forward the surgery sheet to you.

## 2017-04-04 NOTE — Telephone Encounter (Signed)
Pt and daughter request we do her procedure. Will need med clearance due to abnormal renal labs.

## 2017-04-06 ENCOUNTER — Other Ambulatory Visit: Payer: Self-pay | Admitting: Nurse Practitioner

## 2017-04-06 DIAGNOSIS — I10 Essential (primary) hypertension: Secondary | ICD-10-CM

## 2017-04-07 ENCOUNTER — Encounter: Payer: Self-pay | Admitting: Nurse Practitioner

## 2017-04-07 ENCOUNTER — Ambulatory Visit (INDEPENDENT_AMBULATORY_CARE_PROVIDER_SITE_OTHER): Payer: Medicare Other | Admitting: Nurse Practitioner

## 2017-04-07 VITALS — BP 136/82 | HR 73 | Temp 98.0°F | Ht 64.0 in | Wt 182.0 lb

## 2017-04-07 DIAGNOSIS — M545 Low back pain, unspecified: Secondary | ICD-10-CM

## 2017-04-07 DIAGNOSIS — Z01818 Encounter for other preprocedural examination: Secondary | ICD-10-CM | POA: Diagnosis not present

## 2017-04-07 DIAGNOSIS — N183 Chronic kidney disease, stage 3 unspecified: Secondary | ICD-10-CM

## 2017-04-07 DIAGNOSIS — I10 Essential (primary) hypertension: Secondary | ICD-10-CM | POA: Diagnosis not present

## 2017-04-07 DIAGNOSIS — K219 Gastro-esophageal reflux disease without esophagitis: Secondary | ICD-10-CM

## 2017-04-07 DIAGNOSIS — G8929 Other chronic pain: Secondary | ICD-10-CM

## 2017-04-07 DIAGNOSIS — R002 Palpitations: Secondary | ICD-10-CM | POA: Diagnosis not present

## 2017-04-07 NOTE — Patient Instructions (Addendum)
TO MAKE APPT WITH CARDIOLOGIST FOR CARDIAC clearance Dr. Conception Oms D. Irish Lack, MD Cardiologist in [place], [place] Address: [address], [place], [place] [address] Phone: [phone]  To start colace or senna S daily for constipation Increase fluid intake  To make dietary modifications for acid reflux Can try zantac (rantidine) 150 mg by mouth daily  tums as needed   Food Choices for Gastroesophageal Reflux Disease, Adult When you have gastroesophageal reflux disease (GERD), the foods you eat and your eating habits are very important. Choosing the right foods can help ease the discomfort of GERD. Consider working with a diet and nutrition specialist (dietitian) to help you make healthy food choices. What general guidelines should I follow? Eating plan  Choose healthy foods low in fat, such as fruits, vegetables, whole grains, low-fat dairy products, and lean meat, fish, and poultry.  Eat frequent, small meals instead of three large meals each day. Eat your meals slowly, in a relaxed setting. Avoid bending over or lying down until 2-3 hours after eating.  Limit high-fat foods such as fatty meats or fried foods.  Limit your intake of oils, butter, and shortening to less than 8 teaspoons each day.  Avoid the following: ? Foods that cause symptoms. These may be different for different people. Keep a food diary to keep track of foods that cause symptoms. ? Alcohol. ? Drinking large amounts of liquid with meals. ? Eating meals during the 2-3 hours before bed.  Cook foods using methods other than frying. This may include baking, grilling, or broiling. Lifestyle   Maintain a healthy weight. Ask your health care provider what weight is healthy for you. If you need to lose weight, work with your health care provider to do so safely.  Exercise for at least 30 minutes on 5 or more days each week, or as told by your health care provider.  Avoid wearing clothes that fit  tightly around your waist and chest.  Do not use any products that contain nicotine or tobacco, such as cigarettes and e-cigarettes. If you need help quitting, ask your health care provider.  Sleep with the head of your bed raised. Use a wedge under the mattress or blocks under the bed frame to raise the head of the bed. What foods are not recommended? The items listed may not be a complete list. Talk with your dietitian about what dietary choices are best for you. Grains Pastries or quick breads with added fat. [place] toast. Vegetables Deep fried vegetables. [place] fries. Any vegetables prepared with added fat. Any vegetables that cause symptoms. For some people this may include tomatoes and tomato products, chili peppers, onions and garlic, and horseradish. Fruits Any fruits prepared with added fat. Any fruits that cause symptoms. For some people this may include citrus fruits, such as oranges, grapefruit, pineapple, and lemons. Meats and other protein foods High-fat meats, such as fatty beef or pork, hot dogs, ribs, ham, sausage, salami and bacon. Fried meat or protein, including fried fish and fried chicken. Nuts and nut butters. Dairy Whole milk and chocolate milk. Sour cream. Cream. Ice cream. Cream cheese. Milk shakes. Beverages Coffee and tea, with or without caffeine. Carbonated beverages. Sodas. Energy drinks. Fruit juice made with acidic fruits (such as orange or grapefruit). Tomato juice. Alcoholic drinks. Fats and oils Butter. Margarine. Shortening. Ghee. Sweets and desserts Chocolate and cocoa. Donuts. Seasoning and other foods Pepper. Peppermint and spearmint. Any condiments, herbs, or seasonings that cause symptoms. For some people,  this may include curry, hot sauce, or vinegar-based salad dressings. Summary  When you have gastroesophageal reflux disease (GERD), food and lifestyle choices are very important to help ease the discomfort of GERD.  Eat frequent, small meals  instead of three large meals each day. Eat your meals slowly, in a relaxed setting. Avoid bending over or lying down until 2-3 hours after eating.  Limit high-fat foods such as fatty meat or fried foods. This information is not intended to replace advice given to you by your health care provider. Make sure you discuss any questions you have with your health care provider. Document Released: 12/31/2004 Document Revised: 01/02/2016 Document Reviewed: 01/02/2016 Elsevier Interactive Patient Education  2018 [employer].   co Constipation, Adult Constipation is when a person has fewer bowel movements in a week than normal, has difficulty having a bowel movement, or has stools that are dry, hard, or larger than normal. Constipation may be caused by an underlying condition. It may become worse with age if a person takes certain medicines and does not take in enough fluids. Follow these instructions at home: Eating and drinking   Eat foods that have a lot of fiber, such as fresh fruits and vegetables, whole grains, and beans.  Limit foods that are high in fat, low in fiber, or overly processed, such as french fries, hamburgers, cookies, candies, and soda.  Drink enough fluid to keep your urine clear or pale yellow. General instructions  Exercise regularly or as told by your health care provider.  Go to the restroom when you have the urge to go. Do not hold it in.  Take over-the-counter and prescription medicines only as told by your health care provider. These include any fiber supplements.  Practice pelvic floor retraining exercises, such as deep breathing while relaxing the lower abdomen and pelvic floor relaxation during bowel movements.  Watch your condition for any changes.  Keep all follow-up visits as told by your health care provider. This is important. Contact a health care provider if:  You have pain that gets worse.  You have a fever.  You do not have a bowel movement  after 4 days.  You vomit.  You are not hungry.  You lose weight.  You are bleeding from the anus.  You have thin, pencil-like stools. Get help right away if:  You have a fever and your symptoms suddenly get worse.  You leak stool or have blood in your stool.  Your abdomen is bloated.  You have severe pain in your abdomen.  You feel dizzy or you faint. This information is not intended to replace advice given to you by your health care provider. Make sure you discuss any questions you have with your health care provider. Document Released: 09/29/2003 Document Revised: 07/21/2015 Document Reviewed: 06/21/2015 Elsevier Interactive Patient Education  2018 [employer].

## 2017-04-07 NOTE — Progress Notes (Signed)
Careteam: Patient Care Team: [name], NP as PCP - General (Geriatric Medicine) Dustin Folks, MD as Consulting Physician (Hematology and Oncology) Esperanza Heir, MD as Consulting Physician (Obstetrics and Gynecology) Consuella Lose, MD as Consulting Physician (Neurosurgery)  Advanced Directive information    Allergies  Allergen Reactions  . Fish-Derived Products     shellfish  . Neomy-Bacit-Polymyx-Pramoxine     Not sure which antibiotic she has a reaction to    Chief Complaint  Patient presents with  . Acute Visit    Pt is being seen for surgical clearance for L3 fusion and decompression. Surgery to be done by Dr. Rodell Perna.   . Other    Daughter in room     HPI: Patient is a 83 y.o. female seen in the office today for surgical evaluation.  Pt  With hx of bilateral carotid artery stenosis, htn, CKD, hypothyroid, depression, dementia, GERD Pt planning to have L3 fusion and decompression by Dr Rodell Perna, has not been scheduled until she was evaluated medically. Reports pain in back worsening throughout the day- tylenol not effective and can not take NSAIDs due to CKD Following with nephrologist due to worsening renal disease.  No recent or remote chest pains, discomfort or palpitations.  No shortness of breath, cough or congestion.  No pulmonary hx Ongoing dizziness which she has had off and on for years but this is unchanged   Review of Systems:  Review of Systems  Constitutional: Negative for chills, fever, malaise/fatigue and weight loss.  HENT: Negative for congestion.   Eyes: Negative for blurred vision.  Respiratory: Negative for cough and shortness of breath.   Cardiovascular: Negative for chest pain, palpitations and leg swelling.  Gastrointestinal: Positive for heartburn. Negative for abdominal pain, blood in stool, constipation and melena.  Genitourinary: Negative for dysuria.  Musculoskeletal: Positive for back pain and joint  pain. Negative for falls.  Skin: Negative for itching and rash.  Neurological: Negative for dizziness, loss of consciousness, weakness and headaches.  Psychiatric/Behavioral: Positive for memory loss. Negative for depression. The patient is not nervous/anxious and does not have insomnia.     Past Medical History:  Diagnosis Date  . Anemia   . Anterolisthesis    grade 1: L3-4  . Anxiety   . Arthritis   . Breast cancer ([place])   . Chronic kidney disease   . Dementia   . Depression   . Gastroesophageal reflux disease   . Heartburn   . Hypertension   . Hypothyroidism   . Leg cramps   . Post-menopause bleeding   . Restless leg syndrome 10/07/2014   Past Surgical History:  Procedure Laterality Date  . BREAST SURGERY Left    Lumpectomy   . CATARACT EXTRACTION, BILATERAL    . hysteroscopy biopsy     Social History:   reports that she has quit smoking. She has a 2.00 pack-year smoking history. She has never used smokeless tobacco. She reports that she does not drink alcohol or use drugs.  Family History  Problem Relation Age of Onset  . Hypertension Mother   . Arthritis Mother   . Heart disease Mother   . Hypertension Father   . Heart disease Father   . Cancer Brother   . Breast cancer Paternal Aunt     Medications: Patient's Medications  New Prescriptions   No medications on file  Previous Medications   ACETAMINOPHEN (TYLENOL) 325 MG CAPS    Take 2 capsules by mouth every  6 (six) hours as needed.   AMLODIPINE (NORVASC) 10 MG TABLET    TAKE 1 TABLET BY MOUTH EVERY DAY   ASPIRIN EC 81 MG TABLET    Take 81 mg by mouth daily.   BUMETANIDE (BUMEX) 2 MG TABLET    Take 2 mg by mouth daily.   BUSPIRONE (BUSPAR) 7.5 MG TABLET    TAKE 1 TABLET BY MOUTH TWICE A DAY   COD LIVER OIL CAPS    Take 1 capsule by mouth 2 (two) times daily.   DONEPEZIL (ARICEPT) 5 MG TABLET    TAKE 1 TABLET (5 MG TOTAL) BY MOUTH AT BEDTIME.   FAMOTIDINE (PEPCID) 20 MG TABLET    TAKE 1 TABLET (20 MG  TOTAL) BY MOUTH 2 (TWO) TIMES DAILY. FOR ACID REFLUX   GARLIC 10 MG CAPS    Take 1 capsule by mouth 2 (two) times daily.   HYDRALAZINE (APRESOLINE) 10 MG TABLET    TAKE 1 TABLET BY MOUTH THREE TIMES A DAY   LEVOTHYROXINE (SYNTHROID, LEVOTHROID) 100 MCG TABLET    TAKE 1 TABLET (100 MCG TOTAL) BY MOUTH DAILY BEFORE BREAKFAST.   LOSARTAN (COZAAR) 50 MG TABLET    Take 1 tablet (50 mg total) by mouth daily.   METOPROLOL SUCCINATE (TOPROL-XL) 50 MG 24 HR TABLET    TAKE 1 TABLET BY MOUTH DAILY WITH OR IMMEDIATELY FOLLOWING A MEAL   ROPINIROLE (REQUIP) 1 MG TABLET    TAKE 1 TABLET (1 MG TOTAL) BY MOUTH AT BEDTIME.   TAMOXIFEN (NOLVADEX) 20 MG TABLET    Take 20 mg by mouth daily.   TRAMADOL (ULTRAM) 50 MG TABLET    TAKE 1 TABLET BY MOUTH EVERY 8 HOURS AS NEEDED   TRAZODONE (DESYREL) 50 MG TABLET    TAKE 1 TABLET (50 MG TOTAL) BY MOUTH AT BEDTIME.   VITAMIN A ([employer] VITAMIN A) 10000 UNIT CAPSULE    Take by mouth.  Modified Medications   No medications on file  Discontinued Medications   MAGNESIUM (CVS TRIPLE MAGNESIUM COMPLEX) 400 MG CAPS    Take by mouth.     Physical Exam:  Vitals:   04/07/17 1057  BP: 136/82  Pulse: 73  Temp: 98 F (36.7 C)  TempSrc: Oral  SpO2: 99%  Weight: 182 lb (82.6 kg)  Height: 5\' 4"  (1.626 m)   Body mass index is 31.24 kg/m.  Physical Exam  Constitutional: She appears well-developed and well-nourished. No distress.  HENT:  Head: Normocephalic and atraumatic.  Mouth/Throat: Oropharynx is clear and moist.  Eyes: Pupils are equal, round, and reactive to light. Conjunctivae are normal.  Cardiovascular: Normal rate and regular rhythm.  Murmur heard. Pulmonary/Chest: Effort normal and breath sounds normal.  Abdominal: Soft. Bowel sounds are normal.  Musculoskeletal: She exhibits no edema or tenderness.  Neurological: She is alert.  Skin: Skin is warm and dry. She is not diaphoretic.  Psychiatric: She has a normal mood and affect.  Mild short term memory loss      Labs reviewed: Basic Metabolic Panel: Recent Labs    04/15/16 1447  05/27/16 2000 05/27/16 2020 09/25/16 0822 01/22/17 1508  NA 139  --  138 142 139 136  K 4.4  --  4.1 4.2 4.7 4.7  CL 106  --  108 107 105 101  CO2 25  --  23  --  26 27  GLUCOSE 97  --  110* 110* 102* 99  BUN 22  --  11 12 20  26*  CREATININE 1.83*   < > 1.57* 1.60* 1.74* 2.23*  CALCIUM 9.1  --  9.2  --  9.4 9.5  TSH 0.54  --   --   --   --  1.93   < > = values in this interval not displayed.   Liver Function Tests: Recent Labs    04/15/16 1447 05/27/16 2000 09/25/16 0822  AST 21 31 24   ALT 15 24 19   ALKPHOS 47 51  --   BILITOT 0.2 0.3 0.3  PROT 7.0 7.4 6.9  ALBUMIN 3.7 3.9  --    No results for input(s): LIPASE, AMYLASE in the last 8760 hours. No results for input(s): AMMONIA in the last 8760 hours. CBC: Recent Labs    05/27/16 2000 05/27/16 2020 09/25/16 0822 01/22/17 1508  WBC 4.6  --  3.7* 5.0  NEUTROABS 2.4  --  1,262* 2,205  HGB 12.2 12.2 11.3* 11.0*  HCT 36.3 36.0 32.5* 32.1*  MCV 92.1  --  89.5 88.9  PLT 236  --  237 250   Lipid Panel: Recent Labs    09/25/16 0822  CHOL 171  HDL 37*  LDLCALC 112*  TRIG 109  CHOLHDL 4.6   TSH: Recent Labs    04/15/16 1447 01/22/17 1508  TSH 0.54 1.93   A1C: No results found for: HGBA1C   Assessment/Plan 1. Essential hypertension -stable, continue current regimen.   2. Preop exam for internal medicine -discussed risk of surgery in regards to memory loss and renal disease- slower recovery and clearing medications. Memory may worsen and not return to baseline. May have complicated recovery course.  - COMPLETE METABOLIC PANEL WITH GFR - CBC with Differential/Platelets  3. Palpitation  -no recent palpitations however was seen by cardiologist April 2018 with recommendations for follow up, pt never followed up. Will have her make follow up prior to surgery.   4. Gastroesophageal reflux disease without esophagitis Ongoing, PPI  stopped due to progressive renal disease, has tried pepcid and zantac without relief. To use tums PRN  5. CKD (chronic kidney disease) stage 3, GFR 30-59 ml/min (HCC) -will follow up lab at this time. Pt avoiding NSAIDs and staying hydrated.   6. Chronic midline low back pain without sciatica Worsening pain,  L3 fusion and decompression by Dr Rodell Perna planned but not scheduled at this time.   Pt will need to follow up with cardiologist prior to surgery being scheduled.   Carlos American. [place], [facility] [phone]

## 2017-04-08 ENCOUNTER — Ambulatory Visit: Payer: Self-pay

## 2017-04-08 LAB — CBC WITH DIFFERENTIAL/PLATELET
Basophils Absolute: 31 cells/uL (ref 0–200)
Basophils Relative: 0.6 %
Eosinophils Absolute: 82 cells/uL (ref 15–500)
Eosinophils Relative: 1.6 %
HCT: 34.8 % — ABNORMAL LOW (ref 35.0–45.0)
Hemoglobin: 11.7 g/dL (ref 11.7–15.5)
Lymphs Abs: 1831 cells/uL (ref 850–3900)
MCH: 30.5 pg (ref 27.0–33.0)
MCHC: 33.6 g/dL (ref 32.0–36.0)
MCV: 90.6 fL (ref 80.0–100.0)
MPV: 10.3 fL (ref 7.5–12.5)
Monocytes Relative: 10 %
Neutro Abs: 2647 cells/uL (ref 1500–7800)
Neutrophils Relative %: 51.9 %
Platelets: 281 10*3/uL (ref 140–400)
RBC: 3.84 10*6/uL (ref 3.80–5.10)
RDW: 13.1 % (ref 11.0–15.0)
Total Lymphocyte: 35.9 %
WBC mixed population: 510 cells/uL (ref 200–950)
WBC: 5.1 10*3/uL (ref 3.8–10.8)

## 2017-04-08 LAB — COMPLETE METABOLIC PANEL WITH GFR
AG Ratio: 1.1 (calc) (ref 1.0–2.5)
ALT: 14 U/L (ref 6–29)
AST: 22 U/L (ref 10–35)
Albumin: 4.1 g/dL (ref 3.6–5.1)
Alkaline phosphatase (APISO): 42 U/L (ref 33–130)
BUN/Creatinine Ratio: 10 (calc) (ref 6–22)
BUN: 36 mg/dL — ABNORMAL HIGH (ref 7–25)
CO2: 30 mmol/L (ref 20–32)
Calcium: 9.9 mg/dL (ref 8.6–10.4)
Chloride: 97 mmol/L — ABNORMAL LOW (ref 98–110)
Creat: 3.67 mg/dL — ABNORMAL HIGH (ref 0.60–0.88)
GFR, Est African American: 13 mL/min/{1.73_m2} — ABNORMAL LOW (ref >=60)
GFR, Est Non African American: 11 mL/min/{1.73_m2} — ABNORMAL LOW (ref >=60)
Globulin: 3.8 g/dL (calc) — ABNORMAL HIGH (ref 1.9–3.7)
Glucose, Bld: 109 mg/dL — ABNORMAL HIGH (ref 65–99)
Potassium: 4.9 mmol/L (ref 3.5–5.3)
Sodium: 137 mmol/L (ref 135–146)
Total Bilirubin: 0.4 mg/dL (ref 0.2–1.2)
Total Protein: 7.9 g/dL (ref 6.1–8.1)

## 2017-04-09 ENCOUNTER — Other Ambulatory Visit: Payer: Self-pay | Admitting: Nurse Practitioner

## 2017-04-10 ENCOUNTER — Other Ambulatory Visit: Payer: Self-pay | Admitting: Nurse Practitioner

## 2017-04-10 DIAGNOSIS — I10 Essential (primary) hypertension: Secondary | ICD-10-CM

## 2017-04-11 ENCOUNTER — Telehealth: Payer: Self-pay | Admitting: *Deleted

## 2017-04-11 NOTE — Telephone Encounter (Signed)
   [facility] Pre-operative Risk Assessment    Request for surgical clearance:  1. What type of surgery is being performed? L 3 -4 FUSION DECOMPRESSION  2. When is this surgery scheduled? TBD  What type of clearance is required (medical clearance vs. Pharmacy clearance to hold med vs. Both)? MEDICAL  3. Are there any medications that need to be held prior to surgery and how long?  4. Practice name and name of physician performing surgery? [facility] DR MARK YATES  5. What is your office phone and fax number? [place] [phone] 6. Anesthesia type (None, local, MAC, general) ? NOT LISTED    Devra Dopp 04/11/2017, 4:24 PM  _________________________________________________________________   (provider comments below)

## 2017-04-15 DIAGNOSIS — E039 Hypothyroidism, unspecified: Secondary | ICD-10-CM | POA: Diagnosis not present

## 2017-04-15 DIAGNOSIS — C50919 Malignant neoplasm of unspecified site of unspecified female breast: Secondary | ICD-10-CM | POA: Diagnosis not present

## 2017-04-15 DIAGNOSIS — K219 Gastro-esophageal reflux disease without esophagitis: Secondary | ICD-10-CM | POA: Diagnosis not present

## 2017-04-15 DIAGNOSIS — I129 Hypertensive chronic kidney disease with stage 1 through stage 4 chronic kidney disease, or unspecified chronic kidney disease: Secondary | ICD-10-CM | POA: Diagnosis not present

## 2017-04-15 DIAGNOSIS — N179 Acute kidney failure, unspecified: Secondary | ICD-10-CM | POA: Diagnosis not present

## 2017-04-15 DIAGNOSIS — I739 Peripheral vascular disease, unspecified: Secondary | ICD-10-CM | POA: Diagnosis not present

## 2017-04-15 DIAGNOSIS — N183 Chronic kidney disease, stage 3 (moderate): Secondary | ICD-10-CM | POA: Diagnosis not present

## 2017-04-15 DIAGNOSIS — G2581 Restless legs syndrome: Secondary | ICD-10-CM | POA: Diagnosis not present

## 2017-04-15 DIAGNOSIS — F329 Major depressive disorder, single episode, unspecified: Secondary | ICD-10-CM | POA: Diagnosis not present

## 2017-04-15 DIAGNOSIS — N2581 Secondary hyperparathyroidism of renal origin: Secondary | ICD-10-CM | POA: Diagnosis not present

## 2017-04-15 DIAGNOSIS — D631 Anemia in chronic kidney disease: Secondary | ICD-10-CM | POA: Diagnosis not present

## 2017-04-15 DIAGNOSIS — N390 Urinary tract infection, site not specified: Secondary | ICD-10-CM | POA: Diagnosis not present

## 2017-04-17 NOTE — Telephone Encounter (Addendum)
Left message to call back.  Kerin Ransom PA-C 04/17/2017 3:27 PM   2nd message left  Rosaria Ferries, PA-C 04/18/2017 2:31 PM [place] [phone]

## 2017-04-18 NOTE — Telephone Encounter (Signed)
   Primary Sargeant, MD (seen by Amie Portland 04/2016 and to follow with Dr Irish Lack)  Chart reviewed as part of pre-operative protocol coverage. Because of Regina Porter's past medical history and time since last visit, he/she will require a follow-up visit in order to better assess preoperative cardiovascular risk.  Spoke with the patient and she prefers that we contact her daughter to schedule the appointment. Toribio Harbour, [phone]  Pre-op covering staff: - Please schedule appointment and call patient to inform them. - Please contact requesting surgeon's office via preferred method (i.e, phone, fax) to inform them of need for appointment prior to surgery.  Rosaria Ferries, PA-C  04/18/2017, 2:37 PM

## 2017-04-18 NOTE — Telephone Encounter (Signed)
No call needed patient has upcoming aapointment with Dr Irish Lack on 04/24/2017; patient aware.

## 2017-04-24 ENCOUNTER — Encounter: Payer: Self-pay | Admitting: Interventional Cardiology

## 2017-04-24 ENCOUNTER — Ambulatory Visit (INDEPENDENT_AMBULATORY_CARE_PROVIDER_SITE_OTHER): Payer: Medicare Other | Admitting: Interventional Cardiology

## 2017-04-24 VITALS — BP 144/62 | HR 67 | Ht 64.0 in | Wt 169.0 lb

## 2017-04-24 DIAGNOSIS — Z01810 Encounter for preprocedural cardiovascular examination: Secondary | ICD-10-CM | POA: Diagnosis not present

## 2017-04-24 DIAGNOSIS — I5189 Other ill-defined heart diseases: Secondary | ICD-10-CM | POA: Diagnosis not present

## 2017-04-24 DIAGNOSIS — N184 Chronic kidney disease, stage 4 (severe): Secondary | ICD-10-CM

## 2017-04-24 DIAGNOSIS — I10 Essential (primary) hypertension: Secondary | ICD-10-CM

## 2017-04-24 DIAGNOSIS — I6523 Occlusion and stenosis of bilateral carotid arteries: Secondary | ICD-10-CM

## 2017-04-24 NOTE — Progress Notes (Signed)
Cardiology Office Note   Date:  04/24/2017   ID:  Regina Porter, DOB 1934-09-18, MRN [id number]  PCP:  [name], NP    No chief complaint on file.    Wt Readings from Last 3 Encounters:  04/24/17 169 lb (76.7 kg)  04/07/17 182 lb (82.6 kg)  03/21/17 189 lb (85.7 kg)       History of Present Illness: Regina Porter is a 83 y.o. female who is being seen today for the evaluation of preoperative cariovascular eval at the request of [name], NP.  Per PMD: "Pt planning to have L3 fusion and decompression by Dr Rodell Perna, has not been scheduled until she was evaluated medically. Reports pain in back worsening throughout the day- tylenol not effective and can not take NSAIDs due to CKD Following with nephrologist due to worsening renal disease.  No recent or remote chest pains, discomfort or palpitations.  No shortness of breath, cough or congestion. " ( On 04/07/17)  She confirms this history today.  She does walk up stairs but is limited most by back pain.  No chest pain.  She had a cardiac evaluation in 2018.  Monitor showed no arrhythmias.  Carotid Doppler showed minimal plaque bilaterally.  Echocardiogram showed normal LV function with age appropriate diastolic dysfunction.      Past Medical History:  Diagnosis Date  . Anemia   . Anterolisthesis    grade 1: L3-4  . Anxiety   . Arthritis   . Breast cancer ([place])   . Chronic kidney disease   . Dementia   . Depression   . Gastroesophageal reflux disease   . Heartburn   . Hypertension   . Hypothyroidism   . Leg cramps   . Post-menopause bleeding   . Restless leg syndrome 10/07/2014    Past Surgical History:  Procedure Laterality Date  . BREAST SURGERY Left    Lumpectomy   . CATARACT EXTRACTION, BILATERAL    . hysteroscopy biopsy       Current Outpatient Medications  Medication Sig Dispense Refill  . Acetaminophen (TYLENOL) 325 MG CAPS Take 2 capsules by mouth every 6 (six) hours as needed.      Marland Kitchen amLODipine (NORVASC) 10 MG tablet TAKE 1 TABLET BY MOUTH EVERY DAY 30 tablet 5  . aspirin EC 81 MG tablet Take 81 mg by mouth daily.    . bumetanide (BUMEX) 2 MG tablet Take 2 mg by mouth daily.  6  . busPIRone (BUSPAR) 7.5 MG tablet TAKE 1 TABLET BY MOUTH TWICE A DAY 180 tablet 0  . Cod Liver Oil CAPS Take 1 capsule by mouth 2 (two) times daily.    Marland Kitchen donepezil (ARICEPT) 5 MG tablet TAKE 1 TABLET (5 MG TOTAL) BY MOUTH AT BEDTIME. 90 tablet 3  . famotidine (PEPCID) 20 MG tablet TAKE 1 TABLET (20 MG TOTAL) BY MOUTH 2 (TWO) TIMES DAILY. FOR ACID REFLUX 60 tablet 3  . FLUoxetine (PROZAC) 20 MG capsule Take 20 mg by mouth daily.    . hydrALAZINE (APRESOLINE) 10 MG tablet TAKE 1 TABLET BY MOUTH THREE TIMES A DAY 90 tablet 6  . levothyroxine (SYNTHROID, LEVOTHROID) 100 MCG tablet TAKE 1 TABLET (100 MCG TOTAL) BY MOUTH DAILY BEFORE BREAKFAST. 90 tablet 1  . losartan (COZAAR) 50 MG tablet Take 1 tablet (50 mg total) by mouth daily. 90 tablet 1  . metoprolol succinate (TOPROL-XL) 50 MG 24 hr tablet TAKE 1 TABLET BY MOUTH DAILY WITH OR IMMEDIATELY  FOLLOWING A MEAL 90 tablet 1  . omeprazole (PRILOSEC) 20 MG capsule Take 20 mg by mouth daily.    Marland Kitchen rOPINIRole (REQUIP) 1 MG tablet TAKE 1 TABLET (1 MG TOTAL) BY MOUTH AT BEDTIME. 90 tablet 0  . tamoxifen (NOLVADEX) 20 MG tablet Take 20 mg by mouth daily.  11  . traMADol (ULTRAM) 50 MG tablet TAKE 1 TABLET BY MOUTH EVERY 8 HOURS AS NEEDED 90 tablet 0  . traZODone (DESYREL) 50 MG tablet TAKE 1 TABLET (50 MG TOTAL) BY MOUTH AT BEDTIME. 30 tablet 3  . vitamin A ([employer] VITAMIN A) 10000 UNIT capsule Take by mouth.     No current facility-administered medications for this visit.     Allergies:   Fish-derived products and Neomy-bacit-polymyx-pramoxine    Social History:  The patient  reports that she has quit smoking. She has a 2.00 pack-year smoking history. She has never used smokeless tobacco. She reports that she does not drink alcohol or use drugs.   Family  History:  The patient's family history includes Arthritis in her mother; Breast cancer in her paternal aunt; Cancer in her brother; Heart disease in her father and mother; Hypertension in her father and mother.    ROS:  Please see the history of present illness.   Otherwise, review of systems are positive for back pain.   All other systems are reviewed and negative.    PHYSICAL EXAM: VS:  BP (!) 144/62   Pulse 67   Ht 5\' 4"  (1.626 m)   Wt 169 lb (76.7 kg)   SpO2 93%   BMI 29.01 kg/m  , BMI Body mass index is 29.01 kg/m. GEN: Well nourished, well developed, in no acute distress  HEENT: normal  Neck: no JVD, carotid bruits, or masses Cardiac: RRR; 2/6 systolic murmurs, rubs, or gallops,no edema  Respiratory:  clear to auscultation bilaterally, normal work of breathing GI: soft, nontender, nondistended, + BS MS: no deformity or atrophy  Skin: warm and dry, no rash Neuro:  Strength and sensation are intact Psych: euthymic mood, full affect   EKG:   The ekg ordered today demonstrates NSR, nonspecific ST changes   Recent Labs: 01/22/2017: TSH 1.93 04/07/2017: ALT 14; BUN 36; Creat 3.67; Hemoglobin 11.7; Platelets 281; Potassium 4.9; Sodium 137   Lipid Panel    Component Value Date/Time   CHOL 171 09/25/2016 0822   TRIG 109 09/25/2016 0822   HDL 37 (L) 09/25/2016 0822   CHOLHDL 4.6 09/25/2016 0822   VLDL 25 08/10/2015 1013   LDLCALC 112 (H) 09/25/2016 2671     Other studies Reviewed: Additional studies/ records that were reviewed today with results demonstrating: echo, monitor and carotid Doppler reviewed.   ASSESSMENT AND PLAN:  1. Preoperative cardiovascular examination: No further cardiac testing needed before back surgery.  Her main risk factors are related to age.  I do not think her perioperative risk for cardiac complication can be lowered. 2. Chronic renal insufficiency, stage IV: I would be very hesitant to do any testing involving dye due to fear of worsening her  kidney function.  Avoid nephrotoxins. 3. Hypertension: Borderline control.  Hypertension meds may be limited by renal function. 4. Okay to hold aspirin 7 days prior to surgery. 5. Diastolic dysfunction: She appears euvolemic.   Current medicines are reviewed at length with the patient today.  The patient concerns regarding her medicines were addressed.  The following changes have been made:  No change  Labs/ tests ordered today include:  No orders of the defined types were placed in this encounter.   Recommend 150 minutes/week of aerobic exercise Low fat, low carb, high fiber diet recommended  Disposition:   FU as needed   Signed, Larae Grooms, MD  04/24/2017 10:20 AM    [address], [place],   [address] Phone: [phone]; Fax: [phone]

## 2017-04-24 NOTE — Patient Instructions (Addendum)
Medication Instructions:  Your physician recommends that you continue on your current medications as directed. Please refer to the Current Medication list given to you today.   Labwork: None ordered  Testing/Procedures: None ordered  Follow-Up: Your physician wants you to follow-up with Dr. Irish Lack AS NEEDED   Any Other Special Instructions Will Be Listed Below (If Applicable).  You have been cleared for surgery   If you need a refill on your cardiac medications before your next appointment, please call your pharmacy.

## 2017-04-26 ENCOUNTER — Other Ambulatory Visit: Payer: Self-pay | Admitting: Nurse Practitioner

## 2017-04-26 DIAGNOSIS — M545 Low back pain, unspecified: Secondary | ICD-10-CM

## 2017-04-26 DIAGNOSIS — G8929 Other chronic pain: Secondary | ICD-10-CM

## 2017-04-28 ENCOUNTER — Ambulatory Visit (INDEPENDENT_AMBULATORY_CARE_PROVIDER_SITE_OTHER): Payer: Medicare Other | Admitting: Nurse Practitioner

## 2017-04-28 ENCOUNTER — Ambulatory Visit: Payer: Self-pay | Admitting: Nurse Practitioner

## 2017-04-28 ENCOUNTER — Encounter: Payer: Self-pay | Admitting: Nurse Practitioner

## 2017-04-28 ENCOUNTER — Ambulatory Visit (INDEPENDENT_AMBULATORY_CARE_PROVIDER_SITE_OTHER): Payer: Medicare Other

## 2017-04-28 VITALS — BP 132/60 | HR 73 | Temp 98.3°F | Ht 64.0 in | Wt 184.0 lb

## 2017-04-28 DIAGNOSIS — G4720 Circadian rhythm sleep disorder, unspecified type: Secondary | ICD-10-CM | POA: Diagnosis not present

## 2017-04-28 DIAGNOSIS — N183 Chronic kidney disease, stage 3 unspecified: Secondary | ICD-10-CM

## 2017-04-28 DIAGNOSIS — Z0000 Encounter for general adult medical examination without abnormal findings: Secondary | ICD-10-CM

## 2017-04-28 DIAGNOSIS — H9319 Tinnitus, unspecified ear: Secondary | ICD-10-CM | POA: Diagnosis not present

## 2017-04-28 DIAGNOSIS — M545 Low back pain, unspecified: Secondary | ICD-10-CM

## 2017-04-28 DIAGNOSIS — G8929 Other chronic pain: Secondary | ICD-10-CM | POA: Diagnosis not present

## 2017-04-28 DIAGNOSIS — F325 Major depressive disorder, single episode, in full remission: Secondary | ICD-10-CM

## 2017-04-28 DIAGNOSIS — I6523 Occlusion and stenosis of bilateral carotid arteries: Secondary | ICD-10-CM | POA: Diagnosis not present

## 2017-04-28 DIAGNOSIS — I10 Essential (primary) hypertension: Secondary | ICD-10-CM

## 2017-04-28 DIAGNOSIS — K219 Gastro-esophageal reflux disease without esophagitis: Secondary | ICD-10-CM

## 2017-04-28 MED ORDER — TRAMADOL HCL 50 MG PO TABS: 50.0000 mg | ORAL_TABLET | Freq: Three times a day (TID) | ORAL | 0 refills | Status: DC | PRN

## 2017-04-28 NOTE — Patient Instructions (Signed)
To take tylenol routinely 3 times daily and add in tramadol as needed for added benefit  Melatonin 3 mg by mouth every night at bedtime

## 2017-04-28 NOTE — Progress Notes (Signed)
Subjective:   Regina Porter is a 83 y.o. female who presents for Medicare Annual (Subsequent) preventive examination.  Last AWV-11/13/2015    Objective:     Vitals: BP 132/60 (BP Location: Left Arm, Patient Position: Sitting)   Pulse 73   Temp 98.3 F (36.8 C) (Oral)   Ht 5\' 4"  (1.626 m)   Wt 184 lb (83.5 kg)   SpO2 99%   BMI 31.58 kg/m   Body mass index is 31.58 kg/m.  Advanced Directives 04/28/2017 09/24/2016 08/30/2016 05/27/2016 04/15/2016 11/13/2015 11/13/2015  Does Patient Have a Medical Advance Directive? No No No No No No No  Would patient like information on creating a medical advance directive? Yes (MAU/Ambulatory/Procedural Areas - Information given) - - - - Yes - Educational materials given Yes - Scientist, clinical (histocompatibility and immunogenetics) given    Tobacco Social History   Tobacco Use  Smoking Status Former Smoker  . Packs/day: 0.10  . Years: 20.00  . Pack years: 2.00  Smokeless Tobacco Never Used     Counseling given: Not Answered   Clinical Intake:  Pre-visit preparation completed: No  Pain : No/denies pain     Nutritional Risks: None Diabetes: No  How often do you need to have someone help you when you read instructions, pamphlets, or other written materials from your doctor or pharmacy?: 1 - Never What is the last grade level you completed in school?: Some college  Interpreter Needed?: No  Information entered by :: Tyson Dense, RN  Past Medical History:  Diagnosis Date  . Anemia   . Anterolisthesis    grade 1: L3-4  . Anxiety   . Arthritis   . Breast cancer ([place])   . Chronic kidney disease   . Dementia   . Depression   . Gastroesophageal reflux disease   . Heartburn   . Hypertension   . Hypothyroidism   . Leg cramps   . Post-menopause bleeding   . Restless leg syndrome 10/07/2014   Past Surgical History:  Procedure Laterality Date  . BREAST SURGERY Left    Lumpectomy   . CATARACT EXTRACTION, BILATERAL    . hysteroscopy biopsy     Family History    Problem Relation Age of Onset  . Hypertension Mother   . Arthritis Mother   . Heart disease Mother   . Hypertension Father   . Heart disease Father   . Cancer Brother   . Breast cancer Paternal Aunt    Social History   Socioeconomic History  . Marital status: Widowed    Spouse name: Not on file  . Number of children: 5  . Years of education: 110  . Highest education level: Not on file  Occupational History  . Occupation: Retired  Scientific laboratory technician  . Financial resource strain: Not hard at all  . Food insecurity:    Worry: Never true    Inability: Never true  . Transportation needs:    Medical: No    Non-medical: No  Tobacco Use  . Smoking status: Former Smoker    Packs/day: 0.10    Years: 20.00    Pack years: 2.00  . Smokeless tobacco: Never Used  Substance and Sexual Activity  . Alcohol use: No    Alcohol/week: 0.0 oz  . Drug use: No  . Sexual activity: Never  Lifestyle  . Physical activity:    Days per week: 0 days    Minutes per session: 0 min  . Stress: Only a little  Relationships  . Social  connections:    Talks on phone: More than three times a week    Gets together: More than three times a week    Attends religious service: Never    Active member of club or organization: No    Attends meetings of clubs or organizations: Never    Relationship status: Widowed  Other Topics Concern  . Not on file  Social History Narrative   Diet?       Do you drink/eat things with caffeine? yes      Marital status?                widow                    What year were you married?      Do you live in a house, apartment, assisted living, condo, trailer, etc.? house      Is it one or more stories? yes      How many persons live in your home? 2      Do you have any pets in your home? (please list) no      Current or past profession:      Do you exercise?         no                             Type & how often? none      Do you have a living will? no   Do you have  a DNR form?  no                                If not, do you want to discuss one? no      Do you have signed POA/HPOA for forms?     Outpatient Encounter Medications as of 04/28/2017  Medication Sig  . Acetaminophen (TYLENOL) 325 MG CAPS Take 2 capsules by mouth every 6 (six) hours as needed.  Marland Kitchen amLODipine (NORVASC) 10 MG tablet TAKE 1 TABLET BY MOUTH EVERY DAY  . aspirin EC 81 MG tablet Take 81 mg by mouth daily.  . busPIRone (BUSPAR) 7.5 MG tablet TAKE 1 TABLET BY MOUTH TWICE A DAY  . Cod Liver Oil CAPS Take 1 capsule by mouth 2 (two) times daily.  Marland Kitchen donepezil (ARICEPT) 5 MG tablet TAKE 1 TABLET (5 MG TOTAL) BY MOUTH AT BEDTIME.  Marland Kitchen FLUoxetine (PROZAC) 20 MG capsule Take 20 mg by mouth daily.  . hydrALAZINE (APRESOLINE) 10 MG tablet TAKE 1 TABLET BY MOUTH THREE TIMES A DAY  . levothyroxine (SYNTHROID, LEVOTHROID) 100 MCG tablet TAKE 1 TABLET (100 MCG TOTAL) BY MOUTH DAILY BEFORE BREAKFAST.  Marland Kitchen losartan (COZAAR) 50 MG tablet Take 1 tablet (50 mg total) by mouth daily.  . metoprolol succinate (TOPROL-XL) 50 MG 24 hr tablet TAKE 1 TABLET BY MOUTH DAILY WITH OR IMMEDIATELY FOLLOWING A MEAL  . ranitidine (ZANTAC) 150 MG tablet Take 150 mg by mouth 2 (two) times daily.  Marland Kitchen rOPINIRole (REQUIP) 1 MG tablet TAKE 1 TABLET (1 MG TOTAL) BY MOUTH AT BEDTIME.  . tamoxifen (NOLVADEX) 20 MG tablet Take 20 mg by mouth daily.  . traMADol (ULTRAM) 50 MG tablet TAKE 1 TABLET BY MOUTH EVERY 8 HOURS AS NEEDED  . traZODone (DESYREL) 50 MG tablet TAKE 1 TABLET (50 MG TOTAL) BY MOUTH AT BEDTIME.  . vitamin  A ([employer] VITAMIN A) 10000 UNIT capsule Take by mouth.  . [DISCONTINUED] omeprazole (PRILOSEC) 20 MG capsule Take 20 mg by mouth daily.  . bumetanide (BUMEX) 2 MG tablet Take 2 mg by mouth daily.  . [DISCONTINUED] famotidine (PEPCID) 20 MG tablet TAKE 1 TABLET (20 MG TOTAL) BY MOUTH 2 (TWO) TIMES DAILY. FOR ACID REFLUX (Patient not taking: Reported on 04/28/2017)   No facility-administered encounter medications on  file as of 04/28/2017.     Activities of Daily Living In your present state of health, do you have any difficulty performing the following activities: 04/28/2017  Hearing? N  Vision? N  Difficulty concentrating or making decisions? Y  Walking or climbing stairs? N  Dressing or bathing? N  Doing errands, shopping? N  Preparing Food and eating ? N  Using the Toilet? N  In the past six months, have you accidently leaked urine? N  Do you have problems with loss of bowel control? N  Managing your Medications? N  Managing your Finances? N  Housekeeping or managing your Housekeeping? N  Some recent data might be hidden    Patient Care Team: [name], NP as PCP - General (Geriatric Medicine) Jettie Booze, MD as PCP - Cardiology (Cardiology) Dustin Folks, MD as Consulting Physician (Hematology and Oncology) Esperanza Heir, MD as Consulting Physician (Obstetrics and Gynecology) Consuella Lose, MD as Consulting Physician (Neurosurgery) Deterding, Jeneen Rinks, MD as Consulting Physician (Nephrology)    Assessment:   This is a routine wellness examination for [place].  Exercise Activities and Dietary recommendations Current Exercise Habits: The patient does not participate in regular exercise at present, Exercise limited by: None identified  Goals    None      Fall Risk Fall Risk  04/28/2017 04/07/2017 01/22/2017 12/25/2016 09/24/2016  Falls in the past year? No No No No No   Is the patient's home free of loose throw rugs in walkways, pet beds, electrical cords, etc?   yes      Grab bars in the bathroom? yes      Handrails on the stairs?   yes      Adequate lighting?   yes   Depression Screen PHQ 2/9 Scores 04/28/2017 11/13/2015 08/10/2015  PHQ - 2 Score 1 0 0     Cognitive Function MMSE - Mini Mental State Exam 04/28/2017 09/11/2015  Orientation to time 5 4  Orientation to Place 5 4  Registration 3 3  Attention/ Calculation 5 5  Recall 1 2  Language-  name 2 objects 2 2  Language- repeat 1 1  Language- follow 3 step command 3 3  Language- read & follow direction 1 1  Write a sentence 1 1  Copy design 1 1  Total score 28 27        Immunization History  Administered Date(s) Administered  . Influenza, High Dose Seasonal PF 01/22/2017  . Influenza,inj,Quad PF,6+ Mos 10/26/2014, 09/11/2015  . Influenza-Unspecified 12/03/2013, 10/26/2014  . Pneumococcal Conjugate-13 10/26/2014  . Pneumococcal Polysaccharide-23 12/03/2013, 11/13/2015  . Tdap 01/14/2009  . Zoster 12/03/2013    Qualifies for Shingles Vaccine? Yes, educated and declined  Screening Tests Health Maintenance  Topic Date Due  . TETANUS/TDAP  04/13/1953  . MAMMOGRAM  06/18/2017  . INFLUENZA VACCINE  08/14/2017  . DEXA SCAN  Completed  . PNA vac Low Risk Adult  Completed    Cancer Screenings: Lung: Low Dose CT Chest recommended if Age 25-80 years, 30 pack-year currently smoking OR have quit w/in  15years. Patient does not qualify. Breast:  Up to date on Mammogram? Yes   Up to date of Bone Density/Dexa? Yes Colorectal: up to date  Additional Screenings:  Hepatitis C Screening: declined     Plan:    I have personally reviewed and addressed the Medicare Annual Wellness questionnaire and have noted the following in the patient's chart:  A. Medical and social history B. Use of alcohol, tobacco or illicit drugs  C. Current medications and supplements D. Functional ability and status E.  Nutritional status F.  Physical activity G. Advance directives H. List of other physicians I.  Hospitalizations, surgeries, and ER visits in previous 12 months J.  [place] to include hearing, vision, cognitive, depression L. Referrals and appointments - none  In addition, I have reviewed and discussed with patient certain preventive protocols, quality metrics, and best practice recommendations. A written personalized care plan for preventive services as well as  general preventive health recommendations were provided to patient.  See attached scanned questionnaire for additional information.   Signed,   Tyson Dense, RN Nurse Health Advisor  Patient Concerns: dizziness, tinnitus, feeling weak intermittently

## 2017-04-28 NOTE — Progress Notes (Signed)
Careteam: Patient Care Team: [name], NP as PCP - General (Geriatric Medicine) Jettie Booze, MD as PCP - Cardiology (Cardiology) Dustin Folks, MD as Consulting Physician (Hematology and Oncology) Esperanza Heir, MD as Consulting Physician (Obstetrics and Gynecology) Consuella Lose, MD as Consulting Physician (Neurosurgery) Deterding, Jeneen Rinks, MD as Consulting Physician (Nephrology)  Advanced Directive information    Allergies  Allergen Reactions  . Fish-Derived Products     shellfish  . Neomy-Bacit-Polymyx-Pramoxine     Not sure which antibiotic she has a reaction to    Chief Complaint  Patient presents with  . Medical Management of Chronic Issues    71month follow-up, AWV completed today, MMSE 28/30 (did not pass clock drawing), - depression, -fall  . Advance Care Planning    No ACP, information given to patient   . Medication Management    Patient not taking Bumex, did not know what it was for. Please advise   . Medication Refill    Rx for tramadol pended: [facility] Database verified     HPI: Patient is a 83 y.o. female seen in the office today for routine follow up.  Pt recently seen for pre-op clearance- and sent to cardiology and nephrology for evaluation.  Pt with hx  bilateral carotid artery stenosis, htn, CKD, hypothyroid, depression, dementia, GERD have L3 fusion and decompression by Dr Rodell Perna, has not been scheduled until she was evaluated medically. Reports pain in back worsening throughout the day- tylenol not effective and can not take NSAIDs due to CKD.  Pt with worsening renal disease - followed up with nephrologist and Cr had improved to 2.73 from 3.67 in office  Clarified bumex, she is taking.  Overall stomach/GERD has improved.   Anxiety/insomnia - mood has been good. Taking buspar, prozac on medication list but prozac has not been prescribed since 2016 and she is not taking. Taking trazodone 50 mg by mouth at bedtime for  sleep.   Taking requip 1 mg daily for sleep/RLS  Chronic back pain- using tramadol 50 mg by mouth TID which helps a lot with the pain, pain still back when walking and moving.  Does not use tylenol routinely.   HTN- taking norvasc, hydralazine, losartan, metoprolol and bumex  Hypothyroid- TSH stable in January, continues on synthroid 100 mcg daily.   Memory- stable, continues on aricept 5 mg daily   Chronic intermediate tinnitus and dizziness; had to go to ED in 2018, reports she has had this for over 10 years but started on magnesium which helped this issue, she has forgotten about it but when nephrologist took her off medication all symptoms came back and now "head is going crazy"  Review of Systems:  Review of Systems  Constitutional: Negative for chills, fever, malaise/fatigue and weight loss.  HENT: Positive for hearing loss and tinnitus. Negative for congestion.   Eyes: Negative for blurred vision.  Respiratory: Negative for cough and shortness of breath.   Cardiovascular: Negative for chest pain, palpitations and leg swelling.  Gastrointestinal: Positive for heartburn (improved). Negative for abdominal pain, blood in stool, constipation and melena.  Genitourinary: Negative for dysuria.  Musculoskeletal: Positive for back pain and joint pain. Negative for falls.  Skin: Negative for itching and rash.  Neurological: Positive for dizziness. Negative for loss of consciousness, weakness and headaches.  Psychiatric/Behavioral: Positive for memory loss. Negative for depression. The patient has insomnia (chronic sleep issues). The patient is not nervous/anxious.     Past Medical History:  Diagnosis Date  .  Anemia   . Anterolisthesis    grade 1: L3-4  . Anxiety   . Arthritis   . Breast cancer ([place])   . Chronic kidney disease   . Dementia   . Depression   . Gastroesophageal reflux disease   . Heartburn   . Hypertension   . Hypothyroidism   . Leg cramps   . Post-menopause  bleeding   . Restless leg syndrome 10/07/2014   Past Surgical History:  Procedure Laterality Date  . BREAST SURGERY Left    Lumpectomy   . CATARACT EXTRACTION, BILATERAL    . hysteroscopy biopsy     Social History:   reports that she has quit smoking. She has a 2.00 pack-year smoking history. She has never used smokeless tobacco. She reports that she does not drink alcohol or use drugs.  Family History  Problem Relation Age of Onset  . Hypertension Mother   . Arthritis Mother   . Heart disease Mother   . Hypertension Father   . Heart disease Father   . Cancer Brother   . Breast cancer Paternal Aunt     Medications: Patient's Medications  New Prescriptions   No medications on file  Previous Medications   ACETAMINOPHEN (TYLENOL) 325 MG CAPS    Take 2 capsules by mouth every 6 (six) hours as needed.   AMLODIPINE (NORVASC) 10 MG TABLET    TAKE 1 TABLET BY MOUTH EVERY DAY   ASPIRIN EC 81 MG TABLET    Take 81 mg by mouth daily.   BUMETANIDE (BUMEX) 2 MG TABLET    Take 2 mg by mouth daily.   BUSPIRONE (BUSPAR) 7.5 MG TABLET    TAKE 1 TABLET BY MOUTH TWICE A DAY   COD LIVER OIL CAPS    Take 1 capsule by mouth 2 (two) times daily.   DONEPEZIL (ARICEPT) 5 MG TABLET    TAKE 1 TABLET (5 MG TOTAL) BY MOUTH AT BEDTIME.   HYDRALAZINE (APRESOLINE) 10 MG TABLET    TAKE 1 TABLET BY MOUTH THREE TIMES A DAY   LEVOTHYROXINE (SYNTHROID, LEVOTHROID) 100 MCG TABLET    TAKE 1 TABLET (100 MCG TOTAL) BY MOUTH DAILY BEFORE BREAKFAST.   LOSARTAN (COZAAR) 50 MG TABLET    Take 1 tablet (50 mg total) by mouth daily.   METOPROLOL SUCCINATE (TOPROL-XL) 50 MG 24 HR TABLET    TAKE 1 TABLET BY MOUTH DAILY WITH OR IMMEDIATELY FOLLOWING A MEAL   RANITIDINE (ZANTAC) 150 MG TABLET    Take 150 mg by mouth 2 (two) times daily.   ROPINIROLE (REQUIP) 1 MG TABLET    TAKE 1 TABLET (1 MG TOTAL) BY MOUTH AT BEDTIME.   TAMOXIFEN (NOLVADEX) 20 MG TABLET    Take 20 mg by mouth daily.   TRAZODONE (DESYREL) 50 MG TABLET     TAKE 1 TABLET (50 MG TOTAL) BY MOUTH AT BEDTIME.   VITAMIN A ([facility] VITAMIN A) 10000 UNIT CAPSULE    Take by mouth.  Modified Medications   Modified Medication Previous Medication   TRAMADOL (ULTRAM) 50 MG TABLET traMADol (ULTRAM) 50 MG tablet      Take 1 tablet (50 mg total) by mouth every 8 (eight) hours as needed.    TAKE 1 TABLET BY MOUTH EVERY 8 HOURS AS NEEDED  Discontinued Medications   FLUOXETINE (PROZAC) 20 MG CAPSULE    Take 20 mg by mouth daily.     Physical Exam:  Vitals:   04/28/17 1458  BP: 132/60  Pulse:  73  Temp: 98.3 F (36.8 C)  TempSrc: Oral  SpO2: 99%  Weight: 184 lb (83.5 kg)  Height: 5\' 4"  (1.626 m)   Body mass index is 31.58 kg/m.  Physical Exam  Constitutional: She appears well-developed and well-nourished. No distress.  HENT:  Head: Normocephalic and atraumatic.  Right Ear: External ear normal.  Left Ear: External ear normal.  Nose: Nose normal.  Mouth/Throat: Oropharynx is clear and moist. No oropharyngeal exudate.  Eyes: Pupils are equal, round, and reactive to light. Conjunctivae are normal.  Cardiovascular: Normal rate and regular rhythm.  Murmur heard. Pulmonary/Chest: Effort normal and breath sounds normal.  Abdominal: Soft. Bowel sounds are normal.  Musculoskeletal: She exhibits no edema or tenderness.  Neurological: She is alert.  Skin: Skin is warm and dry. She is not diaphoretic.  Psychiatric: She has a normal mood and affect.  Mild short term memory loss    Labs reviewed: Basic Metabolic Panel: Recent Labs    09/25/16 0822 01/22/17 1508 04/07/17 1206  NA 139 136 137  K 4.7 4.7 4.9  CL 105 101 97*  CO2 26 27 30   GLUCOSE 102* 99 109*  BUN 20 26* 36*  CREATININE 1.74* 2.23* 3.67*  CALCIUM 9.4 9.5 9.9  TSH  --  1.93  --    Liver Function Tests: Recent Labs    05/27/16 2000 09/25/16 0822 04/07/17 1206  AST 31 24 22   ALT 24 19 14   ALKPHOS 51  --   --   BILITOT 0.3 0.3 0.4  PROT 7.4 6.9 7.9  ALBUMIN 3.9  --   --     No results for input(s): LIPASE, AMYLASE in the last 8760 hours. No results for input(s): AMMONIA in the last 8760 hours. CBC: Recent Labs    09/25/16 0822 01/22/17 1508 04/07/17 1206  WBC 3.7* 5.0 5.1  NEUTROABS 1,262* 2,205 2,647  HGB 11.3* 11.0* 11.7  HCT 32.5* 32.1* 34.8*  MCV 89.5 88.9 90.6  PLT 237 250 281   Lipid Panel: Recent Labs    09/25/16 0822  CHOL 171  HDL 37*  LDLCALC 112*  TRIG 109  CHOLHDL 4.6   TSH: Recent Labs    01/22/17 1508  TSH 1.93   A1C: No results found for: HGBA1C   Assessment/Plan 1. Chronic midline low back pain without sciatica -awaiting surgery, ultram has been effective in helping relieve pain but does not totally alleviate.  - traMADol (ULTRAM) 50 MG tablet; Take 1 tablet (50 mg total) by mouth every 8 (eight) hours as needed.  Dispense: 90 tablet; Refill: 0  2. Gastroesophageal reflux disease without esophagitis Improved at this time  3. Tinnitus, unspecified laterality Recently worsening of symptoms  - Ambulatory referral to ENT  4. Wellness examination AWV today, MMSE stable.  Encouraged proper diet and exercise.   5. CKD (chronic kidney disease) stage 3, GFR 30-59 ml/min (HCC) Worsening renal disease noted on recent lab, following with nephrology, recent BUN/Cr improved on recent lab.  6. Essential hypertension Blood pressure well controlled on current regimen, will continue losartan, metoprolol succinate, hydralazine, norvasc 10 mg by mouth daily and bumex  7. Major depressive disorder with single episode, in full remission ([place]) Stable, continues on trazodone and buspar twice daily which has been helping mood.   8. Sleep-wake cycle disorder Stable, continues on trazodone   Next appt: 09/22/2017 [facility]. [place], [facility] [phone]

## 2017-04-28 NOTE — Patient Instructions (Addendum)
[name] , Thank you for taking time to come for your Medicare Wellness Visit. I appreciate your ongoing commitment to your health goals. Please review the following plan we discussed and let me know if I can assist you in the future.   Screening recommendations/referrals: Colonoscopy excluded, you are over age 81 Mammogram up to date, due 06/19/2017 Bone Density up to date Recommended yearly ophthalmology/optometry visit for glaucoma screening and checkup Recommended yearly dental visit for hygiene and checkup  Vaccinations: Influenza vaccine up to date, due 2019 fall season Pneumococcal vaccine up to date, completed Tdap vaccine up to date, due 01/15/2019 Shingles vaccine due.declined  Advanced directives: Advance directive discussed with you today. I have provided a copy for you to complete at home and have notarized. Once this is complete please bring a copy in to our office so we can scan it into your chart.  Conditions/risks identified: none  Next appointment: Tyson Dense, RN 05/01/2018 @ 12:45pm   Preventive Care 65 Years and Older, Female Preventive care refers to lifestyle choices and visits with your health care provider that can promote health and wellness. What does preventive care include?  A yearly physical exam. This is also called an annual well check.  Dental exams once or twice a year.  Routine eye exams. Ask your health care provider how often you should have your eyes checked.  Personal lifestyle choices, including:  Daily care of your teeth and gums.  Regular physical activity.  Eating a healthy diet.  Avoiding tobacco and drug use.  Limiting alcohol use.  Practicing safe sex.  Taking low-dose aspirin every day.  Taking vitamin and mineral supplements as recommended by your health care provider. What happens during an annual well check? The services and screenings done by your health care provider during your annual well check will depend on your  age, overall health, lifestyle risk factors, and family history of disease. Counseling  Your health care provider may ask you questions about your:  Alcohol use.  Tobacco use.  Drug use.  Emotional well-being.  Home and relationship well-being.  Sexual activity.  Eating habits.  History of falls.  Memory and ability to understand (cognition).  Work and work Statistician.  Reproductive health. Screening  You may have the following tests or measurements:  Height, weight, and BMI.  Blood pressure.  Lipid and cholesterol levels. These may be checked every 5 years, or more frequently if you are over 49 years old.  Skin check.  Lung cancer screening. You may have this screening every year starting at age 70 if you have a 30-pack-year history of smoking and currently smoke or have quit within the past 15 years.  Fecal occult blood test (FOBT) of the stool. You may have this test every year starting at age 7.  Flexible sigmoidoscopy or colonoscopy. You may have a sigmoidoscopy every 5 years or a colonoscopy every 10 years starting at age 12.  Hepatitis C blood test.  Hepatitis B blood test.  Sexually transmitted disease (STD) testing.  Diabetes screening. This is done by checking your blood sugar (glucose) after you have not eaten for a while (fasting). You may have this done every 1-3 years.  Bone density scan. This is done to screen for osteoporosis. You may have this done starting at age 23.  Mammogram. This may be done every 1-2 years. Talk to your health care provider about how often you should have regular mammograms. Talk with your health care provider about your test  results, treatment options, and if necessary, the need for more tests. Vaccines  Your health care provider may recommend certain vaccines, such as:  Influenza vaccine. This is recommended every year.  Tetanus, diphtheria, and acellular pertussis (Tdap, Td) vaccine. You may need a Td booster  every 10 years.  Zoster vaccine. You may need this after age 27.  Pneumococcal 13-valent conjugate (PCV13) vaccine. One dose is recommended after age 65.  Pneumococcal polysaccharide (PPSV23) vaccine. One dose is recommended after age 30. Talk to your health care provider about which screenings and vaccines you need and how often you need them. This information is not intended to replace advice given to you by your health care provider. Make sure you discuss any questions you have with your health care provider. Document Released: 01/27/2015 Document Revised: 09/20/2015 Document Reviewed: 11/01/2014 Elsevier Interactive Patient Education  2017 [place] Prevention in the Home Falls can cause injuries. They can happen to people of all ages. There are many things you can do to make your home safe and to help prevent falls. What can I do on the outside of my home?  Regularly fix the edges of walkways and driveways and fix any cracks.  Remove anything that might make you trip as you walk through a door, such as a raised step or threshold.  Trim any bushes or trees on the path to your home.  Use bright outdoor lighting.  Clear any walking paths of anything that might make someone trip, such as rocks or tools.  Regularly check to see if handrails are loose or broken. Make sure that both sides of any steps have handrails.  Any raised decks and porches should have guardrails on the edges.  Have any leaves, snow, or ice cleared regularly.  Use sand or salt on walking paths during winter.  Clean up any spills in your garage right away. This includes oil or grease spills. What can I do in the bathroom?  Use night lights.  Install grab bars by the toilet and in the tub and shower. Do not use towel bars as grab bars.  Use non-skid mats or decals in the tub or shower.  If you need to sit down in the shower, use a plastic, non-slip stool.  Keep the floor dry. Clean up any  water that spills on the floor as soon as it happens.  Remove soap buildup in the tub or shower regularly.  Attach bath mats securely with double-sided non-slip rug tape.  Do not have throw rugs and other things on the floor that can make you trip. What can I do in the bedroom?  Use night lights.  Make sure that you have a light by your bed that is easy to reach.  Do not use any sheets or blankets that are too big for your bed. They should not hang down onto the floor.  Have a firm chair that has side arms. You can use this for support while you get dressed.  Do not have throw rugs and other things on the floor that can make you trip. What can I do in the kitchen?  Clean up any spills right away.  Avoid walking on wet floors.  Keep items that you use a lot in easy-to-reach places.  If you need to reach something above you, use a strong step stool that has a grab bar.  Keep electrical cords out of the way.  Do not use floor polish or wax that makes  floors slippery. If you must use wax, use non-skid floor wax.  Do not have throw rugs and other things on the floor that can make you trip. What can I do with my stairs?  Do not leave any items on the stairs.  Make sure that there are handrails on both sides of the stairs and use them. Fix handrails that are broken or loose. Make sure that handrails are as long as the stairways.  Check any carpeting to make sure that it is firmly attached to the stairs. Fix any carpet that is loose or worn.  Avoid having throw rugs at the top or bottom of the stairs. If you do have throw rugs, attach them to the floor with carpet tape.  Make sure that you have a light switch at the top of the stairs and the bottom of the stairs. If you do not have them, ask someone to add them for you. What else can I do to help prevent falls?  Wear shoes that:  Do not have high heels.  Have rubber bottoms.  Are comfortable and fit you well.  Are closed  at the toe. Do not wear sandals.  If you use a stepladder:  Make sure that it is fully opened. Do not climb a closed stepladder.  Make sure that both sides of the stepladder are locked into place.  Ask someone to hold it for you, if possible.  Clearly mark and make sure that you can see:  Any grab bars or handrails.  First and last steps.  Where the edge of each step is.  Use tools that help you move around (mobility aids) if they are needed. These include:  Canes.  Walkers.  Scooters.  Crutches.  Turn on the lights when you go into a dark area. Replace any light bulbs as soon as they burn out.  Set up your furniture so you have a clear path. Avoid moving your furniture around.  If any of your floors are uneven, fix them.  If there are any pets around you, be aware of where they are.  Review your medicines with your doctor. Some medicines can make you feel dizzy. This can increase your chance of falling. Ask your doctor what other things that you can do to help prevent falls. This information is not intended to replace advice given to you by your health care provider. Make sure you discuss any questions you have with your health care provider. Document Released: 10/27/2008 Document Revised: 06/08/2015 Document Reviewed: 02/04/2014 Elsevier Interactive Patient Education  2017 [employer].

## 2017-05-05 ENCOUNTER — Telehealth: Payer: Self-pay | Admitting: *Deleted

## 2017-05-05 DIAGNOSIS — N183 Chronic kidney disease, stage 3 (moderate): Secondary | ICD-10-CM | POA: Diagnosis not present

## 2017-05-05 DIAGNOSIS — I129 Hypertensive chronic kidney disease with stage 1 through stage 4 chronic kidney disease, or unspecified chronic kidney disease: Secondary | ICD-10-CM | POA: Diagnosis not present

## 2017-05-05 NOTE — Telephone Encounter (Signed)
Caregiver called and stated that patient is taking Pepcid and then Jessica recommended Zantac. Caregiver wants to confirm if patient should be taking both Anti Acid medications. Please Advise.

## 2017-05-05 NOTE — Telephone Encounter (Signed)
Caregiver notified and agreed.  

## 2017-05-05 NOTE — Telephone Encounter (Signed)
She should take either zantac OR pepcid

## 2017-05-08 ENCOUNTER — Other Ambulatory Visit: Payer: Self-pay | Admitting: *Deleted

## 2017-05-08 MED ORDER — LOSARTAN POTASSIUM 50 MG PO TABS: 50.0000 mg | ORAL_TABLET | Freq: Every day | ORAL | 1 refills | Status: DC

## 2017-05-08 NOTE — Telephone Encounter (Signed)
[facility]  

## 2017-05-14 ENCOUNTER — Other Ambulatory Visit: Payer: Self-pay | Admitting: Nurse Practitioner

## 2017-05-22 ENCOUNTER — Telehealth (INDEPENDENT_AMBULATORY_CARE_PROVIDER_SITE_OTHER): Payer: Self-pay | Admitting: Orthopaedic Surgery

## 2017-05-22 NOTE — Telephone Encounter (Signed)
Patient daughter Regina Porter called stating waiting to hear from Dr.Yates to see if she was cleared for surgery and stated all other MD's sent in their paperwork.   The patient wants to have surgery and has not heard from this office.  Please call daughter Elder Love @ [phone] to advise.

## 2017-05-22 NOTE — Telephone Encounter (Signed)
Do you know anything about clearance?

## 2017-05-23 NOTE — Telephone Encounter (Signed)
noted 

## 2017-05-23 NOTE — Telephone Encounter (Signed)
Patient is scheduled for surgery on 06-04-17@12 :30pm.  She has a H&P with [name], PA on 05-29-17 @9 :30.  We now have clearances on the following:   Nephrology-Dr. Deterding  Cardiology-Dr. Trenton Founds, NP

## 2017-05-26 DIAGNOSIS — N390 Urinary tract infection, site not specified: Secondary | ICD-10-CM | POA: Diagnosis not present

## 2017-05-26 DIAGNOSIS — F329 Major depressive disorder, single episode, unspecified: Secondary | ICD-10-CM | POA: Diagnosis not present

## 2017-05-26 DIAGNOSIS — I739 Peripheral vascular disease, unspecified: Secondary | ICD-10-CM | POA: Diagnosis not present

## 2017-05-26 DIAGNOSIS — C50919 Malignant neoplasm of unspecified site of unspecified female breast: Secondary | ICD-10-CM | POA: Diagnosis not present

## 2017-05-26 DIAGNOSIS — N184 Chronic kidney disease, stage 4 (severe): Secondary | ICD-10-CM | POA: Diagnosis not present

## 2017-05-26 DIAGNOSIS — G2581 Restless legs syndrome: Secondary | ICD-10-CM | POA: Diagnosis not present

## 2017-05-26 DIAGNOSIS — I129 Hypertensive chronic kidney disease with stage 1 through stage 4 chronic kidney disease, or unspecified chronic kidney disease: Secondary | ICD-10-CM | POA: Diagnosis not present

## 2017-05-26 DIAGNOSIS — E039 Hypothyroidism, unspecified: Secondary | ICD-10-CM | POA: Diagnosis not present

## 2017-05-26 DIAGNOSIS — K219 Gastro-esophageal reflux disease without esophagitis: Secondary | ICD-10-CM | POA: Diagnosis not present

## 2017-05-26 DIAGNOSIS — N179 Acute kidney failure, unspecified: Secondary | ICD-10-CM | POA: Diagnosis not present

## 2017-05-26 DIAGNOSIS — D631 Anemia in chronic kidney disease: Secondary | ICD-10-CM | POA: Diagnosis not present

## 2017-05-26 DIAGNOSIS — N2581 Secondary hyperparathyroidism of renal origin: Secondary | ICD-10-CM | POA: Diagnosis not present

## 2017-05-29 ENCOUNTER — Ambulatory Visit (INDEPENDENT_AMBULATORY_CARE_PROVIDER_SITE_OTHER): Payer: Medicare Other | Admitting: Surgery

## 2017-05-29 ENCOUNTER — Encounter (INDEPENDENT_AMBULATORY_CARE_PROVIDER_SITE_OTHER): Payer: Self-pay | Admitting: Surgery

## 2017-05-29 VITALS — BP 145/57 | HR 64 | Temp 97.5°F | Resp 16 | Ht 64.0 in | Wt 185.0 lb

## 2017-05-29 DIAGNOSIS — M5416 Radiculopathy, lumbar region: Secondary | ICD-10-CM

## 2017-05-29 DIAGNOSIS — I6523 Occlusion and stenosis of bilateral carotid arteries: Secondary | ICD-10-CM | POA: Diagnosis not present

## 2017-05-29 DIAGNOSIS — M48062 Spinal stenosis, lumbar region with neurogenic claudication: Secondary | ICD-10-CM | POA: Diagnosis not present

## 2017-05-29 NOTE — Progress Notes (Signed)
83 year old white female history of L3-4 stenosis for preop evaluation.  Today full H&P performed and was placed in patient's chart.

## 2017-05-30 ENCOUNTER — Encounter (HOSPITAL_COMMUNITY): Payer: Self-pay

## 2017-05-30 ENCOUNTER — Encounter (HOSPITAL_COMMUNITY)
Admission: RE | Admit: 2017-05-30 | Discharge: 2017-05-30 | Disposition: A | Discharge status: Home / Self Care | Payer: Medicare Other | Source: Ambulatory Visit | Attending: Orthopaedic Surgery | Admitting: Orthopaedic Surgery

## 2017-05-30 ENCOUNTER — Other Ambulatory Visit: Payer: Self-pay

## 2017-05-30 ENCOUNTER — Telehealth (INDEPENDENT_AMBULATORY_CARE_PROVIDER_SITE_OTHER): Payer: Self-pay | Admitting: Orthopaedic Surgery

## 2017-05-30 ENCOUNTER — Ambulatory Visit (HOSPITAL_COMMUNITY)
Admission: RE | Admit: 2017-05-30 | Discharge: 2017-05-30 | Disposition: A | Discharge status: Home / Self Care | Payer: Medicare Other | Source: Ambulatory Visit | Attending: Surgery | Admitting: Surgery

## 2017-05-30 DIAGNOSIS — Z01810 Encounter for preprocedural cardiovascular examination: Secondary | ICD-10-CM | POA: Diagnosis not present

## 2017-05-30 DIAGNOSIS — Z01818 Encounter for other preprocedural examination: Secondary | ICD-10-CM | POA: Diagnosis not present

## 2017-05-30 DIAGNOSIS — Z01812 Encounter for preprocedural laboratory examination: Secondary | ICD-10-CM | POA: Diagnosis not present

## 2017-05-30 HISTORY — DX: Dyspnea, unspecified: R06.00

## 2017-05-30 HISTORY — DX: Headache: R51

## 2017-05-30 LAB — TYPE AND SCREEN
ABO/RH(D): O POS
Antibody Screen: NEGATIVE

## 2017-05-30 LAB — URINALYSIS, ROUTINE W REFLEX MICROSCOPIC
Bilirubin Urine: NEGATIVE
Glucose, UA: NEGATIVE mg/dL
Hgb urine dipstick: NEGATIVE
Ketones, ur: NEGATIVE mg/dL
Leukocytes, UA: NEGATIVE
Nitrite: NEGATIVE
Protein, ur: NEGATIVE mg/dL
Specific Gravity, Urine: 1.006 (ref 1.005–1.030)
pH: 5 (ref 5.0–8.0)

## 2017-05-30 LAB — ABO/RH: ABO/RH(D): O POS

## 2017-05-30 LAB — COMPREHENSIVE METABOLIC PANEL
ALT: 19 U/L (ref 14–54)
AST: 24 U/L (ref 15–41)
Albumin: 3.8 g/dL (ref 3.5–5.0)
Alkaline Phosphatase: 36 U/L — ABNORMAL LOW (ref 38–126)
Anion gap: 9 (ref 5–15)
BUN: 34 mg/dL — ABNORMAL HIGH (ref 6–20)
CO2: 25 mmol/L (ref 22–32)
Calcium: 9.5 mg/dL (ref 8.9–10.3)
Chloride: 106 mmol/L (ref 101–111)
Creatinine, Ser: 2.81 mg/dL — ABNORMAL HIGH (ref 0.44–1.00)
GFR calc Af Amer: 17 mL/min — ABNORMAL LOW (ref >60)
GFR calc non Af Amer: 15 mL/min — ABNORMAL LOW (ref >60)
Glucose, Bld: 106 mg/dL — ABNORMAL HIGH (ref 65–99)
Potassium: 4 mmol/L (ref 3.5–5.1)
Sodium: 140 mmol/L (ref 135–145)
Total Bilirubin: 0.5 mg/dL (ref 0.3–1.2)
Total Protein: 7.7 g/dL (ref 6.5–8.1)

## 2017-05-30 LAB — CBC
HCT: 33.9 % — ABNORMAL LOW (ref 36.0–46.0)
Hemoglobin: 11 g/dL — ABNORMAL LOW (ref 12.0–15.0)
MCH: 29.6 pg (ref 26.0–34.0)
MCHC: 32.4 g/dL (ref 30.0–36.0)
MCV: 91.4 fL (ref 78.0–100.0)
Platelets: 274 10*3/uL (ref 150–400)
RBC: 3.71 MIL/uL — ABNORMAL LOW (ref 3.87–5.11)
RDW: 13.8 % (ref 11.5–15.5)
WBC: 5.2 10*3/uL (ref 4.0–10.5)

## 2017-05-30 LAB — PROTIME-INR
INR: 1
Prothrombin Time: 13.1 seconds (ref 11.4–15.2)

## 2017-05-30 LAB — SURGICAL PCR SCREEN
MRSA, PCR: NEGATIVE
Staphylococcus aureus: NEGATIVE

## 2017-05-30 LAB — APTT: aPTT: 29 seconds (ref 24–36)

## 2017-05-30 NOTE — Telephone Encounter (Signed)
Rehab Center    Pt daughter called would like another facility to check out along with [facility]

## 2017-05-30 NOTE — Pre-Procedure Instructions (Signed)
Regina Porter  05/30/2017      [id number] - [place], [place] - [address] [place] [address] Phone: [phone] Fax: [phone]    Your procedure is scheduled on  Wednesday 06/04/17  Report to [facility] Admitting at 1030 A.M.  Call this number if you have problems the morning of surgery:  [phone]   Remember:  No food OR DRINK after midnight.      Take these medicines the morning of surgery with A SIP OF WATER- AMLODIIPINE (NORVASC), BUSPIRONE (BUSPAR), ARICEPT, FAMOTIDINE (PEPCID), LEVOTHYROXINE, TAMOXIFEN, , EYE DROPS ,TRAMADOL IF NEEDED  7 days prior to surgery STOP taking any Aspirin(unless otherwise instructed by your surgeon), Aleve, Naproxen, Ibuprofen, Motrin, Advil, Goody's, BC's, all herbal medications, fish oil, and all vitamins    Do not wear jewelry, make-up or nail polish.  Do not wear lotions, powders, or perfumes, or deodorant.  Do not shave 48 hours prior to surgery.  Men may shave face and neck.  Do not bring valuables to the hospital.  [facility] is not responsible for any belongings or valuables.  Contacts, dentures or bridgework may not be worn into surgery.  Leave your suitcase in the car.  After surgery it may be brought to your room.  For patients admitted to the hospital, discharge time will be determined by your treatment team.  Patients discharged the day of surgery will not be allowed to drive home.   Name and phone number of your driver:    Special instructions:  [facility] - Preparing for Surgery  Before surgery, you can play an important role.  Because skin is not sterile, your skin needs to be as free of germs as possible.  You can reduce the number of germs on you skin by washing with CHG (chlorahexidine gluconate) soap before surgery.  CHG is an antiseptic cleaner which kills germs and bonds with the skin to continue killing germs even after washing.  Oral Hygiene is also important in  reducing the risk of infection.  Remember to brush your teeth the morning of surgery.  Please DO NOT use if you have an allergy to CHG or antibacterial soaps.  If your skin becomes reddened/irritated stop using the CHG and inform your nurse when you arrive at Short Stay.  Do not shave (including legs and underarms) for at least 48 hours prior to the first CHG shower.  You may shave your face.  Please follow these instructions carefully:   1.  Shower with CHG Soap the night before surgery and the morning of Surgery.  2.  If you choose to wash your hair, wash your hair first as usual with your normal shampoo.  3.  After you shampoo, rinse your hair and body thoroughly to remove the shampoo. 4.  Use CHG as you would any other liquid soap.  You can apply chg directly to the skin and wash gently with a      scrungie or washcloth.           5.  Apply the CHG Soap to your body ONLY FROM THE NECK DOWN.   Do not use on open wounds or open sores. Avoid contact with your eyes, ears, mouth and genitals (private parts).  Wash genitals (private parts) with your normal soap.  6.  Wash thoroughly, paying special attention to the area where your surgery will be performed.  7.  Thoroughly rinse your body with warm water from the neck  down.  8.  DO NOT shower/wash with your normal soap after using and rinsing off the CHG Soap.  9.  Pat yourself dry with a clean towel.            10.  Wear clean pajamas.            11.  Place clean sheets on your bed the night of your first shower and do not sleep with pets.  Day of Surgery  Do not apply any lotions/deoderants the morning of surgery.   Please wear clean clothes to the hospital/surgery center. Remember to brush your teeth.     Please read over the following fact sheets that you were given. MRSA Information and Surgical Site Infection Prevention

## 2017-05-31 ENCOUNTER — Other Ambulatory Visit: Payer: Self-pay | Admitting: Nurse Practitioner

## 2017-05-31 DIAGNOSIS — K219 Gastro-esophageal reflux disease without esophagitis: Secondary | ICD-10-CM

## 2017-06-02 DIAGNOSIS — R2689 Other abnormalities of gait and mobility: Secondary | ICD-10-CM | POA: Diagnosis not present

## 2017-06-02 DIAGNOSIS — R42 Dizziness and giddiness: Secondary | ICD-10-CM | POA: Diagnosis not present

## 2017-06-02 DIAGNOSIS — H903 Sensorineural hearing loss, bilateral: Secondary | ICD-10-CM | POA: Diagnosis not present

## 2017-06-02 NOTE — Telephone Encounter (Signed)
Is this for you to handle? Thanks

## 2017-06-02 NOTE — Progress Notes (Signed)
Anesthesia Chart Review:   Case:  [id number] Date/Time:  06/04/17 1215   Procedure:  L3-4 TRANSFORAMINAL LUMBAR INTERBODY FUSION, CAGE, PEDICLE INSTRUMENTATION, GILL PROCEDURE (N/A )   Anesthesia type:  General   Pre-op diagnosis:  L3-4 severe stenosis, instability   Location:  [facility] ROOM 06 / [facility]   Surgeon:  Marybelle Killings, MD      DISCUSSION:   - Pt is an 83 year old female with hx CKD (stage IV, not yet on dialysis)   - Cleared for surgery by PCP.   - Saw cardiologist Dr. Irish Lack 04/24/17 for pre-op eval; cleared for surgery without additional testing; main risk factor is age. Prn f/u recommended.   - Pt to hold ASA 7 days before surgery    VS: BP (!) 132/44 Comment: rechecked manually  Pulse 69   Temp 36.7 C   Resp 18   Ht 5\' 5"  (1.651 m)   Wt 184 lb 8 oz (83.7 kg)   SpO2 99%   BMI 30.70 kg/m    PROVIDERS: PCP is [name], NP   Patient Care Team: Dustin Folks, MD as Consulting Physician (Hematology and Oncology) Esperanza Heir, MD as Consulting Physician (Obstetrics and Gynecology) Consuella Lose, MD as Consulting Physician (Neurosurgery) Deterding, Jeneen Rinks, MD as Consulting Physician (Nephrology)   LABS: Labs reviewed: Acceptable for surgery.  - Renal function consistent with advanced CKD.   (all labs ordered are listed, but only abnormal results are displayed)  Labs Reviewed  CBC - Abnormal; Notable for the following components:      Result Value   RBC 3.71 (*)    Hemoglobin 11.0 (*)    HCT 33.9 (*)    All other components within normal limits  COMPREHENSIVE METABOLIC PANEL - Abnormal; Notable for the following components:   Glucose, Bld 106 (*)    BUN 34 (*)    Creatinine, Ser 2.81 (*)    Alkaline Phosphatase 36 (*)    GFR calc non Af Amer 15 (*)    GFR calc Af Amer 17 (*)    All other components within normal limits  URINALYSIS, ROUTINE W REFLEX MICROSCOPIC - Abnormal; Notable for the following components:   Color, Urine  STRAW (*)    All other components within normal limits  SURGICAL PCR SCREEN  APTT  PROTIME-INR  TYPE AND SCREEN  ABO/RH     IMAGES:  CXR 05/30/17: No active cardiopulmonary disease.   EKG 04/24/17: NSR, nonspecific ST changes   CV:  Carotid duplex 05/10/16:  - Heterogeneous plaque, bilaterally. - Tortuous RCCA and LICA. - 1-39% bilateral ICA stenosis (high end range). - Normal subclavian arteries, bilaterally. - Patent vertebral arteries with antegrade flow  Cardiac event monitor 05/07/16:   Normal sinus rhythm with prlonged PR interval.  No sustained arrhythmia.  No arrhythmia noted with her chest pain  Echo 05/07/16:  - Left ventricle: The cavity size was normal. Wall thickness was increased in a pattern of mild LVH. Systolic function was vigorous. The estimated ejection fraction was in the range of 65% to 70%. Wall motion was normal; there were no regional wall motion abnormalities. Doppler parameters are consistent with abnormal left ventricular relaxation (grade 1 diastolic dysfunction). - Aortic valve: Mildly to moderately calcified annulus. - Mitral valve: There was mild regurgitation.   Past Medical History:  Diagnosis Date  . Anemia   . Anterolisthesis    grade 1: L3-4  . Anxiety   . Arthritis   . Breast  cancer ([place])   . Chronic kidney disease   . Dementia   . Depression   . Dyspnea    W/ PAIN   . Gastroesophageal reflux disease   . Headache    TO SEE NEURO MD  . Heartburn   . Hypertension   . Hypothyroidism   . Leg cramps   . Post-menopause bleeding   . Restless leg syndrome 10/07/2014    Past Surgical History:  Procedure Laterality Date  . BREAST SURGERY Left    Lumpectomy   . CATARACT EXTRACTION, BILATERAL    . hysteroscopy biopsy      MEDICATIONS: . acetaminophen (TYLENOL) 325 MG tablet  . amLODipine (NORVASC) 10 MG tablet  . Ascorbic Acid (VITAMIN C) 100 MG tablet  . aspirin EC 81 MG tablet  . bumetanide (BUMEX) 2 MG tablet  .  busPIRone (BUSPAR) 7.5 MG tablet  . Cod Liver Oil 1000 MG CAPS  . donepezil (ARICEPT) 5 MG tablet  . famotidine (PEPCID) 20 MG tablet  . hydrALAZINE (APRESOLINE) 10 MG tablet  . levothyroxine (SYNTHROID, LEVOTHROID) 100 MCG tablet  . losartan (COZAAR) 100 MG tablet  . losartan (COZAAR) 50 MG tablet  . metoprolol succinate (TOPROL-XL) 100 MG 24 hr tablet  . metoprolol succinate (TOPROL-XL) 50 MG 24 hr tablet  . ranitidine (ZANTAC) 150 MG tablet  . rOPINIRole (REQUIP) 1 MG tablet  . tamoxifen (NOLVADEX) 20 MG tablet  . Tetrahydrozoline HCl (VISINE OP)  . traMADol (ULTRAM) 50 MG tablet  . traZODone (DESYREL) 50 MG tablet  . vitamin A ([employer] VITAMIN A) 10000 UNIT capsule   No current facility-administered medications for this encounter.     If no changes, I anticipate pt can proceed with surgery as scheduled.   Willeen Cass, FNP-BC [facility]/Anesthesiology Phone: [phone] 06/02/2017 9:49 AM

## 2017-06-04 ENCOUNTER — Encounter (HOSPITAL_COMMUNITY): Payer: Self-pay

## 2017-06-04 ENCOUNTER — Inpatient Hospital Stay (HOSPITAL_COMMUNITY): Payer: Medicare Other | Admitting: Emergency Medicine

## 2017-06-04 ENCOUNTER — Other Ambulatory Visit: Payer: Self-pay

## 2017-06-04 ENCOUNTER — Encounter (HOSPITAL_COMMUNITY)
Admission: RE | Disposition: A | Discharge status: Home / Self Care | Payer: Self-pay | Source: Home / Self Care | Attending: Orthopaedic Surgery

## 2017-06-04 ENCOUNTER — Inpatient Hospital Stay (HOSPITAL_COMMUNITY)
Admission: RE | Admit: 2017-06-04 | Discharge: 2017-06-08 | DRG: 460 | Disposition: A | Discharge status: Home / Self Care | Payer: Medicare Other | Attending: Orthopaedic Surgery | Admitting: Orthopaedic Surgery

## 2017-06-04 ENCOUNTER — Inpatient Hospital Stay (HOSPITAL_COMMUNITY): Payer: Medicare Other

## 2017-06-04 ENCOUNTER — Inpatient Hospital Stay (HOSPITAL_COMMUNITY): Payer: Medicare Other | Admitting: Anesthesiology

## 2017-06-04 DIAGNOSIS — K219 Gastro-esophageal reflux disease without esophagitis: Secondary | ICD-10-CM | POA: Diagnosis present

## 2017-06-04 DIAGNOSIS — M48062 Spinal stenosis, lumbar region with neurogenic claudication: Secondary | ICD-10-CM | POA: Diagnosis not present

## 2017-06-04 DIAGNOSIS — R12 Heartburn: Secondary | ICD-10-CM | POA: Diagnosis present

## 2017-06-04 DIAGNOSIS — F32A Depression, unspecified: Secondary | ICD-10-CM | POA: Diagnosis present

## 2017-06-04 DIAGNOSIS — E1022 Type 1 diabetes mellitus with diabetic chronic kidney disease: Secondary | ICD-10-CM | POA: Diagnosis not present

## 2017-06-04 DIAGNOSIS — G301 Alzheimer's disease with late onset: Secondary | ICD-10-CM

## 2017-06-04 DIAGNOSIS — Z7982 Long term (current) use of aspirin: Secondary | ICD-10-CM | POA: Diagnosis not present

## 2017-06-04 DIAGNOSIS — F329 Major depressive disorder, single episode, unspecified: Secondary | ICD-10-CM | POA: Diagnosis present

## 2017-06-04 DIAGNOSIS — M48061 Spinal stenosis, lumbar region without neurogenic claudication: Secondary | ICD-10-CM | POA: Diagnosis present

## 2017-06-04 DIAGNOSIS — G8929 Other chronic pain: Secondary | ICD-10-CM | POA: Diagnosis not present

## 2017-06-04 DIAGNOSIS — Z853 Personal history of malignant neoplasm of breast: Secondary | ICD-10-CM

## 2017-06-04 DIAGNOSIS — Z8249 Family history of ischemic heart disease and other diseases of the circulatory system: Secondary | ICD-10-CM | POA: Diagnosis not present

## 2017-06-04 DIAGNOSIS — I130 Hypertensive heart and chronic kidney disease with heart failure and stage 1 through stage 4 chronic kidney disease, or unspecified chronic kidney disease: Secondary | ICD-10-CM | POA: Diagnosis present

## 2017-06-04 DIAGNOSIS — Z79890 Hormone replacement therapy (postmenopausal): Secondary | ICD-10-CM | POA: Diagnosis not present

## 2017-06-04 DIAGNOSIS — Z91013 Allergy to seafood: Secondary | ICD-10-CM | POA: Diagnosis not present

## 2017-06-04 DIAGNOSIS — M4316 Spondylolisthesis, lumbar region: Secondary | ICD-10-CM | POA: Diagnosis not present

## 2017-06-04 DIAGNOSIS — Z9842 Cataract extraction status, left eye: Secondary | ICD-10-CM

## 2017-06-04 DIAGNOSIS — N184 Chronic kidney disease, stage 4 (severe): Secondary | ICD-10-CM | POA: Diagnosis present

## 2017-06-04 DIAGNOSIS — Z79899 Other long term (current) drug therapy: Secondary | ICD-10-CM

## 2017-06-04 DIAGNOSIS — I10 Essential (primary) hypertension: Secondary | ICD-10-CM | POA: Diagnosis not present

## 2017-06-04 DIAGNOSIS — G2581 Restless legs syndrome: Secondary | ICD-10-CM | POA: Diagnosis present

## 2017-06-04 DIAGNOSIS — N183 Chronic kidney disease, stage 3 unspecified: Secondary | ICD-10-CM | POA: Diagnosis present

## 2017-06-04 DIAGNOSIS — D631 Anemia in chronic kidney disease: Secondary | ICD-10-CM | POA: Diagnosis present

## 2017-06-04 DIAGNOSIS — E875 Hyperkalemia: Secondary | ICD-10-CM | POA: Diagnosis not present

## 2017-06-04 DIAGNOSIS — F0280 Dementia in other diseases classified elsewhere without behavioral disturbance: Secondary | ICD-10-CM | POA: Diagnosis not present

## 2017-06-04 DIAGNOSIS — I129 Hypertensive chronic kidney disease with stage 1 through stage 4 chronic kidney disease, or unspecified chronic kidney disease: Secondary | ICD-10-CM | POA: Diagnosis not present

## 2017-06-04 DIAGNOSIS — M532X6 Spinal instabilities, lumbar region: Secondary | ICD-10-CM | POA: Diagnosis not present

## 2017-06-04 DIAGNOSIS — Z881 Allergy status to other antibiotic agents status: Secondary | ICD-10-CM | POA: Diagnosis not present

## 2017-06-04 DIAGNOSIS — Z87891 Personal history of nicotine dependence: Secondary | ICD-10-CM | POA: Diagnosis not present

## 2017-06-04 DIAGNOSIS — F0390 Unspecified dementia without behavioral disturbance: Secondary | ICD-10-CM | POA: Diagnosis present

## 2017-06-04 DIAGNOSIS — M545 Low back pain, unspecified: Secondary | ICD-10-CM | POA: Diagnosis present

## 2017-06-04 DIAGNOSIS — M4326 Fusion of spine, lumbar region: Secondary | ICD-10-CM | POA: Diagnosis not present

## 2017-06-04 DIAGNOSIS — I5032 Chronic diastolic (congestive) heart failure: Secondary | ICD-10-CM | POA: Diagnosis present

## 2017-06-04 DIAGNOSIS — Z9841 Cataract extraction status, right eye: Secondary | ICD-10-CM | POA: Diagnosis not present

## 2017-06-04 DIAGNOSIS — E039 Hypothyroidism, unspecified: Secondary | ICD-10-CM | POA: Diagnosis present

## 2017-06-04 LAB — BASIC METABOLIC PANEL
Anion gap: 7 (ref 5–15)
BUN: 31 mg/dL — ABNORMAL HIGH (ref 6–20)
CO2: 25 mmol/L (ref 22–32)
Calcium: 8.7 mg/dL — ABNORMAL LOW (ref 8.9–10.3)
Chloride: 109 mmol/L (ref 101–111)
Creatinine, Ser: 2.3 mg/dL — ABNORMAL HIGH (ref 0.44–1.00)
GFR calc Af Amer: 22 mL/min — ABNORMAL LOW (ref >60)
GFR calc non Af Amer: 19 mL/min — ABNORMAL LOW (ref >60)
Glucose, Bld: 179 mg/dL — ABNORMAL HIGH (ref 65–99)
Potassium: 4.4 mmol/L (ref 3.5–5.1)
Sodium: 141 mmol/L (ref 135–145)

## 2017-06-04 SURGERY — POSTERIOR LUMBAR FUSION 1 LEVEL
Anesthesia: General

## 2017-06-04 MED ORDER — LABETALOL HCL 5 MG/ML IV SOLN
INTRAVENOUS | Status: DC | PRN
  Administered 2017-06-04 (×2): 5 mg via INTRAVENOUS
  Administered 2017-06-04: 10 mg via INTRAVENOUS

## 2017-06-04 MED ORDER — ACETAMINOPHEN 10 MG/ML IV SOLN: 1000.0000 mg | Freq: Once | INTRAVENOUS | Status: DC | PRN

## 2017-06-04 MED ORDER — POLYETHYLENE GLYCOL 3350 17 G PO PACK
17.0000 g | PACK | Freq: Every day | ORAL | Status: DC | PRN
  Administered 2017-06-07: 17 g via ORAL
  Filled 2017-06-04: qty 1

## 2017-06-04 MED ORDER — MENTHOL 3 MG MT LOZG
1.0000 | LOZENGE | OROMUCOSAL | Status: DC | PRN
Start: 2017-06-04 — End: 2017-06-08

## 2017-06-04 MED ORDER — SODIUM CHLORIDE 0.9 % IV SOLN: 250.0000 mL | INTRAVENOUS | Status: DC

## 2017-06-04 MED ORDER — SODIUM CHLORIDE 0.9% FLUSH: 3.0000 mL | INTRAVENOUS | Status: DC | PRN

## 2017-06-04 MED ORDER — ONDANSETRON HCL 4 MG/2ML IJ SOLN
4.0000 mg | Freq: Four times a day (QID) | INTRAMUSCULAR | Status: DC | PRN
  Administered 2017-06-04: 4 mg via INTRAVENOUS
  Filled 2017-06-04: qty 2

## 2017-06-04 MED ORDER — HYDROCODONE-ACETAMINOPHEN 5-325 MG PO TABS
1.0000 | ORAL_TABLET | Freq: Four times a day (QID) | ORAL | Status: DC | PRN
  Administered 2017-06-04 – 2017-06-08 (×15): 1 via ORAL
  Filled 2017-06-04 (×16): qty 1

## 2017-06-04 MED ORDER — ROCURONIUM BROMIDE 10 MG/ML (PF) SYRINGE
PREFILLED_SYRINGE | INTRAVENOUS | Status: AC
  Filled 2017-06-04: qty 5

## 2017-06-04 MED ORDER — SUGAMMADEX SODIUM 200 MG/2ML IV SOLN
INTRAVENOUS | Status: AC
  Filled 2017-06-04: qty 2

## 2017-06-04 MED ORDER — ACETAMINOPHEN 325 MG PO TABS
650.0000 mg | ORAL_TABLET | ORAL | Status: DC | PRN
  Administered 2017-06-04 – 2017-06-08 (×12): 650 mg via ORAL
  Filled 2017-06-04 (×12): qty 2

## 2017-06-04 MED ORDER — HYDROMORPHONE HCL 2 MG/ML IJ SOLN
INTRAMUSCULAR | Status: AC
  Administered 2017-06-04: 0.5 mg via INTRAVENOUS
  Filled 2017-06-04: qty 1

## 2017-06-04 MED ORDER — SODIUM CHLORIDE 0.9% FLUSH
3.0000 mL | Freq: Two times a day (BID) | INTRAVENOUS | Status: DC
  Administered 2017-06-04 – 2017-06-08 (×6): 3 mL via INTRAVENOUS

## 2017-06-04 MED ORDER — ESMOLOL HCL 100 MG/10ML IV SOLN
INTRAVENOUS | Status: DC | PRN
  Administered 2017-06-04: 40 mg via INTRAVENOUS

## 2017-06-04 MED ORDER — ROPINIROLE HCL 1 MG PO TABS
1.0000 mg | ORAL_TABLET | Freq: Every day | ORAL | Status: DC
  Administered 2017-06-04 – 2017-06-07 (×4): 1 mg via ORAL
  Filled 2017-06-04 (×4): qty 1

## 2017-06-04 MED ORDER — BUSPIRONE HCL 5 MG PO TABS
7.5000 mg | ORAL_TABLET | Freq: Two times a day (BID) | ORAL | Status: DC
  Administered 2017-06-04 – 2017-06-08 (×8): 7.5 mg via ORAL
  Filled 2017-06-04 (×9): qty 2

## 2017-06-04 MED ORDER — FENTANYL CITRATE (PF) 250 MCG/5ML IJ SOLN
INTRAMUSCULAR | Status: AC
  Filled 2017-06-04: qty 5

## 2017-06-04 MED ORDER — PHENOL 1.4 % MT LIQD
1.0000 | OROMUCOSAL | Status: DC | PRN
Start: 2017-06-04 — End: 2017-06-08

## 2017-06-04 MED ORDER — HYDROCODONE-ACETAMINOPHEN 7.5-325 MG PO TABS: 1.0000 | ORAL_TABLET | Freq: Once | ORAL | Status: DC | PRN

## 2017-06-04 MED ORDER — DONEPEZIL HCL 5 MG PO TABS
5.0000 mg | ORAL_TABLET | Freq: Every day | ORAL | Status: DC
  Administered 2017-06-05 – 2017-06-08 (×4): 5 mg via ORAL
  Filled 2017-06-04 (×4): qty 1

## 2017-06-04 MED ORDER — CEFAZOLIN SODIUM-DEXTROSE 2-4 GM/100ML-% IV SOLN
2.0000 g | Freq: Three times a day (TID) | INTRAVENOUS | Status: AC
  Administered 2017-06-04 – 2017-06-05 (×2): 2 g via INTRAVENOUS
  Filled 2017-06-04 (×3): qty 100

## 2017-06-04 MED ORDER — HYDROMORPHONE HCL 2 MG/ML IJ SOLN
0.2500 mg | INTRAMUSCULAR | Status: DC | PRN
  Administered 2017-06-04: 0.5 mg via INTRAVENOUS

## 2017-06-04 MED ORDER — DOCUSATE SODIUM 100 MG PO CAPS
100.0000 mg | ORAL_CAPSULE | Freq: Two times a day (BID) | ORAL | Status: DC
  Administered 2017-06-04 – 2017-06-08 (×8): 100 mg via ORAL
  Filled 2017-06-04 (×8): qty 1

## 2017-06-04 MED ORDER — ROCURONIUM BROMIDE 10 MG/ML (PF) SYRINGE
PREFILLED_SYRINGE | INTRAVENOUS | Status: DC | PRN
  Administered 2017-06-04: 10 mg via INTRAVENOUS
  Administered 2017-06-04: 20 mg via INTRAVENOUS
  Administered 2017-06-04 (×2): 10 mg via INTRAVENOUS
  Administered 2017-06-04: 40 mg via INTRAVENOUS

## 2017-06-04 MED ORDER — PROPOFOL 10 MG/ML IV BOLUS
INTRAVENOUS | Status: DC | PRN
  Administered 2017-06-04: 110 mg via INTRAVENOUS
  Administered 2017-06-04: 30 mg via INTRAVENOUS

## 2017-06-04 MED ORDER — ACETAMINOPHEN 650 MG RE SUPP: 650.0000 mg | RECTAL | Status: DC | PRN

## 2017-06-04 MED ORDER — AMLODIPINE BESYLATE 10 MG PO TABS
10.0000 mg | ORAL_TABLET | Freq: Every day | ORAL | Status: DC
  Administered 2017-06-05 – 2017-06-08 (×4): 10 mg via ORAL
  Filled 2017-06-04 (×4): qty 1

## 2017-06-04 MED ORDER — MIDAZOLAM HCL 5 MG/5ML IJ SOLN
INTRAMUSCULAR | Status: DC | PRN
  Administered 2017-06-04 (×2): 1 mg via INTRAVENOUS

## 2017-06-04 MED ORDER — LIDOCAINE 2% (20 MG/ML) 5 ML SYRINGE
INTRAMUSCULAR | Status: AC
  Filled 2017-06-04: qty 5

## 2017-06-04 MED ORDER — DEXAMETHASONE SODIUM PHOSPHATE 10 MG/ML IJ SOLN
INTRAMUSCULAR | Status: DC | PRN
  Administered 2017-06-04: 5 mg via INTRAVENOUS

## 2017-06-04 MED ORDER — HYDROCODONE-ACETAMINOPHEN 5-325 MG PO TABS
ORAL_TABLET | ORAL | Status: AC
  Filled 2017-06-04: qty 1

## 2017-06-04 MED ORDER — ONDANSETRON HCL 4 MG PO TABS: 4.0000 mg | ORAL_TABLET | Freq: Four times a day (QID) | ORAL | Status: DC | PRN

## 2017-06-04 MED ORDER — ONDANSETRON HCL 4 MG/2ML IJ SOLN
INTRAMUSCULAR | Status: AC
  Filled 2017-06-04: qty 2

## 2017-06-04 MED ORDER — THROMBIN (RECOMBINANT) 5000 UNITS EX SOLR
CUTANEOUS | Status: AC
  Filled 2017-06-04: qty 10000

## 2017-06-04 MED ORDER — SODIUM CHLORIDE 0.9 % IV SOLN
INTRAVENOUS | Status: DC
  Administered 2017-06-04 – 2017-06-05 (×2): via INTRAVENOUS

## 2017-06-04 MED ORDER — DEXAMETHASONE SODIUM PHOSPHATE 10 MG/ML IJ SOLN
INTRAMUSCULAR | Status: AC
  Filled 2017-06-04: qty 1

## 2017-06-04 MED ORDER — MEPERIDINE HCL 50 MG/ML IJ SOLN: 6.2500 mg | INTRAMUSCULAR | Status: DC | PRN

## 2017-06-04 MED ORDER — FAMOTIDINE 20 MG PO TABS
20.0000 mg | ORAL_TABLET | Freq: Two times a day (BID) | ORAL | Status: DC
  Administered 2017-06-04 – 2017-06-05 (×2): 20 mg via ORAL
  Filled 2017-06-04 (×2): qty 1

## 2017-06-04 MED ORDER — PHENYLEPHRINE HCL 10 MG/ML IJ SOLN
INTRAVENOUS | Status: DC | PRN
  Administered 2017-06-04: 40 ug/min via INTRAVENOUS

## 2017-06-04 MED ORDER — CHLORHEXIDINE GLUCONATE 4 % EX LIQD: 60.0000 mL | Freq: Once | CUTANEOUS | Status: DC

## 2017-06-04 MED ORDER — LACTATED RINGERS IV SOLN
INTRAVENOUS | Status: DC | PRN
  Administered 2017-06-04 (×2): via INTRAVENOUS

## 2017-06-04 MED ORDER — LIDOCAINE 2% (20 MG/ML) 5 ML SYRINGE
INTRAMUSCULAR | Status: DC | PRN
  Administered 2017-06-04: 100 mg via INTRAVENOUS

## 2017-06-04 MED ORDER — THROMBIN 5000 UNITS EX SOLR: CUTANEOUS | Status: DC | PRN

## 2017-06-04 MED ORDER — TRAZODONE HCL 50 MG PO TABS
50.0000 mg | ORAL_TABLET | Freq: Every day | ORAL | Status: DC
  Administered 2017-06-04 – 2017-06-07 (×4): 50 mg via ORAL
  Filled 2017-06-04 (×4): qty 1

## 2017-06-04 MED ORDER — METOPROLOL SUCCINATE ER 100 MG PO TB24
100.0000 mg | ORAL_TABLET | Freq: Every day | ORAL | Status: DC
Start: 2017-06-04 — End: 2017-06-08
  Administered 2017-06-04 – 2017-06-07 (×4): 100 mg via ORAL
  Filled 2017-06-04 (×4): qty 1

## 2017-06-04 MED ORDER — TETRAHYDROZOLINE HCL 0.05 % OP SOLN
1.0000 [drp] | Freq: Every day | OPHTHALMIC | Status: DC | PRN
Start: 2017-06-04 — End: 2017-06-08
  Filled 2017-06-04: qty 15

## 2017-06-04 MED ORDER — FENTANYL CITRATE (PF) 100 MCG/2ML IJ SOLN
INTRAMUSCULAR | Status: DC | PRN
  Administered 2017-06-04: 100 ug via INTRAVENOUS
  Administered 2017-06-04 (×8): 50 ug via INTRAVENOUS

## 2017-06-04 MED ORDER — ONDANSETRON HCL 4 MG/2ML IJ SOLN
INTRAMUSCULAR | Status: DC | PRN
  Administered 2017-06-04: 4 mg via INTRAVENOUS

## 2017-06-04 MED ORDER — LOSARTAN POTASSIUM 50 MG PO TABS: 100.0000 mg | ORAL_TABLET | Freq: Every day | ORAL | Status: DC

## 2017-06-04 MED ORDER — LEVOTHYROXINE SODIUM 100 MCG PO TABS
100.0000 ug | ORAL_TABLET | Freq: Every day | ORAL | Status: DC
  Administered 2017-06-05 – 2017-06-08 (×4): 100 ug via ORAL
  Filled 2017-06-04 (×4): qty 1

## 2017-06-04 MED ORDER — MIDAZOLAM HCL 2 MG/2ML IJ SOLN
INTRAMUSCULAR | Status: AC
  Filled 2017-06-04: qty 2

## 2017-06-04 MED ORDER — SUGAMMADEX SODIUM 200 MG/2ML IV SOLN
INTRAVENOUS | Status: DC | PRN
  Administered 2017-06-04: 150 mg via INTRAVENOUS

## 2017-06-04 MED ORDER — TAMOXIFEN CITRATE 10 MG PO TABS
20.0000 mg | ORAL_TABLET | Freq: Every day | ORAL | Status: DC
  Administered 2017-06-06 – 2017-06-08 (×3): 20 mg via ORAL
  Filled 2017-06-04 (×5): qty 2

## 2017-06-04 MED ORDER — CEFAZOLIN SODIUM-DEXTROSE 2-4 GM/100ML-% IV SOLN
2.0000 g | INTRAVENOUS | Status: AC
  Administered 2017-06-04: 2 g via INTRAVENOUS
  Filled 2017-06-04: qty 100

## 2017-06-04 SURGICAL SUPPLY — 65 items
BLADE CLIPPER SURG (BLADE) IMPLANT
BUR ROUND FLUTED 4 SOFT TCH (BURR) ×2 IMPLANT
CAP SPINAL LOCKING TI (Cap) ×8 IMPLANT
CLSR STERI-STRIP ANTIMIC 1/2X4 (GAUZE/BANDAGES/DRESSINGS) ×2 IMPLANT
CORDS BIPOLAR (ELECTRODE) ×2 IMPLANT
COVER BACK TABLE 80X110 HD (DRAPES) ×2 IMPLANT
COVER SURGICAL LIGHT HANDLE (MISCELLANEOUS) ×2 IMPLANT
DERMABOND ADVANCED (GAUZE/BANDAGES/DRESSINGS) ×1
DERMABOND ADVANCED .7 DNX12 (GAUZE/BANDAGES/DRESSINGS) ×1 IMPLANT
DRAPE C-ARM 42X72 X-RAY (DRAPES) ×2 IMPLANT
DRAPE MICROSCOPE LEICA (MISCELLANEOUS) ×2 IMPLANT
DRAPE SURG 17X23 STRL (DRAPES) ×6 IMPLANT
DRSG EMULSION OIL 3X3 NADH (GAUZE/BANDAGES/DRESSINGS) ×2 IMPLANT
DRSG MEPILEX BORDER 4X4 (GAUZE/BANDAGES/DRESSINGS) ×2 IMPLANT
DRSG MEPILEX BORDER 4X8 (GAUZE/BANDAGES/DRESSINGS) ×2 IMPLANT
DRSG PAD ABDOMINAL 8X10 ST (GAUZE/BANDAGES/DRESSINGS) ×4 IMPLANT
DURAPREP 26ML APPLICATOR (WOUND CARE) ×2 IMPLANT
ELECT BLADE 4.0 EZ CLEAN MEGAD (MISCELLANEOUS) ×2
ELECT CAUTERY BLADE 6.4 (BLADE) ×2 IMPLANT
ELECT REM PT RETURN 9FT ADLT (ELECTROSURGICAL) ×2
ELECTRODE BLDE 4.0 EZ CLN MEGD (MISCELLANEOUS) ×1 IMPLANT
ELECTRODE REM PT RTRN 9FT ADLT (ELECTROSURGICAL) ×1 IMPLANT
EVACUATOR 1/8 PVC DRAIN (DRAIN) ×2 IMPLANT
GAUZE SPONGE 4X4 12PLY STRL (GAUZE/BANDAGES/DRESSINGS) ×2 IMPLANT
GLOVE BIOGEL PI IND STRL 8 (GLOVE) ×2 IMPLANT
GLOVE BIOGEL PI INDICATOR 8 (GLOVE) ×2
GLOVE ORTHO TXT STRL SZ7.5 (GLOVE) ×4 IMPLANT
GOWN STRL REUS W/ TWL LRG LVL3 (GOWN DISPOSABLE) ×1 IMPLANT
GOWN STRL REUS W/ TWL XL LVL3 (GOWN DISPOSABLE) ×1 IMPLANT
GOWN STRL REUS W/TWL 2XL LVL3 (GOWN DISPOSABLE) ×2 IMPLANT
GOWN STRL REUS W/TWL LRG LVL3 (GOWN DISPOSABLE) ×1
GOWN STRL REUS W/TWL XL LVL3 (GOWN DISPOSABLE) ×1
HEMOSTAT SURGICEL 2X14 (HEMOSTASIS) IMPLANT
KIT BASIN OR (CUSTOM PROCEDURE TRAY) ×2 IMPLANT
KIT POSITION SURG JACKSON T1 (MISCELLANEOUS) IMPLANT
KIT TURNOVER KIT B (KITS) ×2 IMPLANT
MANIFOLD NEPTUNE II (INSTRUMENTS) ×2 IMPLANT
NS IRRIG 1000ML POUR BTL (IV SOLUTION) ×2 IMPLANT
PACK LAMINECTOMY ORTHO (CUSTOM PROCEDURE TRAY) ×2 IMPLANT
PAD ABD 8X10 STRL (GAUZE/BANDAGES/DRESSINGS) ×2 IMPLANT
PAD ARMBOARD 7.5X6 YLW CONV (MISCELLANEOUS) ×4 IMPLANT
PATTIES SURGICAL .5 X.5 (GAUZE/BANDAGES/DRESSINGS) IMPLANT
PATTIES SURGICAL .75X.75 (GAUZE/BANDAGES/DRESSINGS) ×2 IMPLANT
ROD 45MM (Rod) ×2 IMPLANT
ROD SPNL CVD 45X5.5XHRD NS (Rod) ×2 IMPLANT
SCREW MATRIX MIS 6.0X45MM (Screw) ×8 IMPLANT
SPACER TI T-PAL 10X28X10 (Spacer) ×2 IMPLANT
SPONGE LAP 18X18 X RAY DECT (DISPOSABLE) IMPLANT
SPONGE LAP 4X18 X RAY DECT (DISPOSABLE) ×4 IMPLANT
SPONGE SURGIFOAM ABS GEL 100 (HEMOSTASIS) IMPLANT
STAPLER VISISTAT 35W (STAPLE) IMPLANT
SURGIFLO W/THROMBIN 8M KIT (HEMOSTASIS) ×2 IMPLANT
SUT BONE WAX W31G (SUTURE) ×2 IMPLANT
SUT VIC AB 1 CTX 36 (SUTURE) ×1
SUT VIC AB 1 CTX36XBRD ANBCTR (SUTURE) ×1 IMPLANT
SUT VIC AB 2-0 CT1 27 (SUTURE) ×1
SUT VIC AB 2-0 CT1 TAPERPNT 27 (SUTURE) ×1 IMPLANT
SUT VIC AB 3-0 X1 27 (SUTURE) IMPLANT
TAP CANN 5MM (TAP) ×2 IMPLANT
TAPE CLOTH SURG 6X10 WHT LF (GAUZE/BANDAGES/DRESSINGS) ×2 IMPLANT
TOWEL OR 17X24 6PK STRL BLUE (TOWEL DISPOSABLE) ×2 IMPLANT
TOWEL OR 17X26 10 PK STRL BLUE (TOWEL DISPOSABLE) ×2 IMPLANT
TRAY FOLEY MTR SLVR 16FR STAT (SET/KITS/TRAYS/PACK) ×2 IMPLANT
WATER STERILE IRR 1000ML POUR (IV SOLUTION) ×2 IMPLANT
YANKAUER SUCT BULB TIP NO VENT (SUCTIONS) ×2 IMPLANT

## 2017-06-04 NOTE — Consult Note (Signed)
Medical Consultation   Regina Porter  [name]  DOB: 20-Jan-1934  DOA: 06/04/2017  PCP: [name], NP  Outpatient Specialists:    Requesting physician: Dr Lorin Mercy.  Reason for consultation: Management of medical issues.   History of Present Illness: Regina Porter is an 83 y.o. female with prior h/o hypertension, hypothyroidism, spinal stenosis,  GERD, depression, dementia, stage 3 CKD, admitted to orthopedics service, for back pain, underwent L3-4 decompression for unstable posterior elements, Bilateral lateral intertransverse process fusion and trans foraminal interbody fusion on 5/22 .  Medical service consulted for management of her medical conditions.  She is currently in PACU, and slightly lethargic from the anesthesia and pain medications. She was able to answer some questions. She currently reports back pain and denies any other complaints.   Review of Systems:   Detailed ROS couldn't be obtained as she was lethargic from the anesthesia and pain meds.    Past Medical History: Past Medical History:  Diagnosis Date  . Anemia   . Anterolisthesis    grade 1: L3-4  . Anxiety   . Arthritis   . Breast cancer ([place])   . Chronic kidney disease   . Dementia   . Depression   . Dyspnea    W/ PAIN   . Gastroesophageal reflux disease   . Headache    TO SEE NEURO MD  . Heartburn   . Hypertension   . Hypothyroidism   . Leg cramps   . Post-menopause bleeding   . Restless leg syndrome 10/07/2014    Past Surgical History: Past Surgical History:  Procedure Laterality Date  . BREAST SURGERY Left    Lumpectomy   . CATARACT EXTRACTION, BILATERAL    . hysteroscopy biopsy       Allergies:   Allergies  Allergen Reactions  . Shellfish Allergy Swelling and Rash    MOUTH  . Neomy-Bacit-Polymyx-Pramoxine     UNSPECIFIED REACTION  Not sure which antibiotic she has a reaction to     Social History:  reports that she has quit smoking. She has a  2.00 pack-year smoking history. She has never used smokeless tobacco. She reports that she does not drink alcohol or use drugs.   Family History: Family History  Problem Relation Age of Onset  . Hypertension Mother   . Arthritis Mother   . Heart disease Mother   . Hypertension Father   . Heart disease Father   . Cancer Brother   . Breast cancer Paternal Aunt     Reviewed, and pertinent.    Physical Exam: Vitals:   06/04/17 1725 06/04/17 1740 06/04/17 1755 06/04/17 1810  BP: (!) 175/53 (!) 171/52 (!) 158/51 (!) 160/125  Pulse: 87 93 91 97  Resp: 11 17 18 14   Temp:      TempSrc:      SpO2: 97% 100% 100% 97%  Weight:      Height:        Constitutional:groggy from the pain meds, oriented to place and person only  Eyes: PERLA, EOMI, irises appear normal, anicteric sclera,  ENMT: external ears and nose appear normal,  Neck: neck appears normal, no masses, normal ROM, no thyromegaly, no JVD  CVS: S1-S2 clear, mild systolic murmer present, no LE edema, normal pedal pulses  Respiratory:  clear to auscultation bilaterally, no wheezing, rales or rhonchi. Respiratory effort normal. No accessory muscle use.  Abdomen: soft nontender, nondistended,  normal bowel sounds, no hepatosplenomegaly, no hernias  Musculoskeletal: : no cyanosis, clubbing or edema noted bilaterally Neuro: grossly non focal. Pt still groggy from the pain meds and anesthesia.  Psych: lethargic from the pain meds.  Skin: no rashes or lesions or ulcers, no induration or nodules     Data reviewed:  I have personally reviewed following labs and imaging studies Labs:  CBC: Recent Labs  Lab 05/30/17 1451  WBC 5.2  HGB 11.0*  HCT 33.9*  MCV 91.4  PLT 371    Basic Metabolic Panel: Recent Labs  Lab 05/30/17 1451 06/04/17 1701  NA 140 141  K 4.0 4.4  CL 106 109  CO2 25 25  GLUCOSE 106* 179*  BUN 34* 31*  CREATININE 2.81* 2.30*  CALCIUM 9.5 8.7*   GFR Estimated Creatinine Clearance: 20.5 mL/min (A)  (by C-G formula based on SCr of 2.3 mg/dL (H)). Liver Function Tests: Recent Labs  Lab 05/30/17 1451  AST 24  ALT 19  ALKPHOS 36*  BILITOT 0.5  PROT 7.7  ALBUMIN 3.8   No results for input(s): LIPASE, AMYLASE in the last 168 hours. No results for input(s): AMMONIA in the last 168 hours. Coagulation profile Recent Labs  Lab 05/30/17 1451  INR 1.00    Cardiac Enzymes: No results for input(s): CKTOTAL, CKMB, CKMBINDEX, TROPONINI in the last 168 hours. BNP: Invalid input(s): POCBNP CBG: No results for input(s): GLUCAP in the last 168 hours. D-Dimer No results for input(s): DDIMER in the last 72 hours. Hgb A1c No results for input(s): HGBA1C in the last 72 hours. Lipid Profile No results for input(s): CHOL, HDL, LDLCALC, TRIG, CHOLHDL, LDLDIRECT in the last 72 hours. Thyroid function studies No results for input(s): TSH, T4TOTAL, T3FREE, THYROIDAB in the last 72 hours.  Invalid input(s): FREET3 Anemia work up No results for input(s): VITAMINB12, FOLATE, FERRITIN, TIBC, IRON, RETICCTPCT in the last 72 hours. Urinalysis    Component Value Date/Time   COLORURINE STRAW (A) 05/30/2017 1450   APPEARANCEUR CLEAR 05/30/2017 1450   LABSPEC 1.006 05/30/2017 1450   PHURINE 5.0 05/30/2017 1450   GLUCOSEU NEGATIVE 05/30/2017 1450   HGBUR NEGATIVE 05/30/2017 1450   BILIRUBINUR NEGATIVE 05/30/2017 1450   BILIRUBINUR negative 12/19/2014 1411   KETONESUR NEGATIVE 05/30/2017 1450   PROTEINUR NEGATIVE 05/30/2017 1450   UROBILINOGEN negative 12/19/2014 1411   NITRITE NEGATIVE 05/30/2017 1450   LEUKOCYTESUR NEGATIVE 05/30/2017 1450     Microbiology Recent Results (from the past 240 hour(s))  Surgical pcr screen     Status: None   Collection Time: 05/30/17  2:50 PM  Result Value Ref Range Status   MRSA, PCR NEGATIVE NEGATIVE Final   Staphylococcus aureus NEGATIVE NEGATIVE Final    Comment: (NOTE) The Xpert SA Assay (FDA approved for NASAL specimens in patients 32 years of age  and older), is one component of a comprehensive surveillance program. It is not intended to diagnose infection nor to guide or monitor treatment. Performed at [facility], [place] [address], [place], [place] [address]        Inpatient Medications:   Scheduled Meds: . chlorhexidine  60 mL Topical Once  . HYDROcodone-acetaminophen       Continuous Infusions:   Radiological Exams on Admission: Dg Lumbar Spine 2-3 Views  Result Date: 06/04/2017 CLINICAL DATA:  Lumbar fusion EXAM: LUMBAR SPINE - 2-3 VIEW; DG C-ARM 61-120 MIN COMPARISON:  06/25/2016 FLUOROSCOPY TIME:  Fluoroscopy Time:  24 seconds Radiation Exposure Index (if provided by the fluoroscopic  device): Not available Number of Acquired Spot Images: 2 FINDINGS: Pedicle screws are noted at L3 and L4 with interbody fusion and posterior fixation. The numbering nomenclature is similar to that utilized on prior MRI. IMPRESSION: Lumbar fusion. Electronically Signed   By: Inez Catalina M.D.   On: 06/04/2017 16:25   Dg C-arm 1-60 Min  Result Date: 06/04/2017 CLINICAL DATA:  Lumbar fusion EXAM: LUMBAR SPINE - 2-3 VIEW; DG C-ARM 61-120 MIN COMPARISON:  06/25/2016 FLUOROSCOPY TIME:  Fluoroscopy Time:  24 seconds Radiation Exposure Index (if provided by the fluoroscopic device): Not available Number of Acquired Spot Images: 2 FINDINGS: Pedicle screws are noted at L3 and L4 with interbody fusion and posterior fixation. The numbering nomenclature is similar to that utilized on prior MRI. IMPRESSION: Lumbar fusion. Electronically Signed   By: Inez Catalina M.D.   On: 06/04/2017 16:25   Dg C-arm 1-60 Min  Result Date: 06/04/2017 CLINICAL DATA:  Lumbar fusion EXAM: LUMBAR SPINE - 2-3 VIEW; DG C-ARM 61-120 MIN COMPARISON:  06/25/2016 FLUOROSCOPY TIME:  Fluoroscopy Time:  24 seconds Radiation Exposure Index (if provided by the fluoroscopic device): Not available Number of Acquired Spot Images: 2 FINDINGS: Pedicle screws are noted at L3 and L4 with  interbody fusion and posterior fixation. The numbering nomenclature is similar to that utilized on prior MRI. IMPRESSION: Lumbar fusion. Electronically Signed   By: Inez Catalina M.D.   On: 06/04/2017 16:25    Impression/Recommendations Active Problems:   Lumbar stenosis   Lumbar stenosis: S/p decompression surgery at L3 and L4, with trans foraminal interbody fusion.  Pain control and further management as per orthopedics.     Hypertension:  Sub optimal. Resume cozaar and metoprolol.   Hypothyroidism:    Dementia:  No behavioral abnormalities. Resume aricept from tomorrow.    GERD: Start her on protonix.    Stage 3 CKD: Creatinine slightly high than baseline.  Baseline creatinine around 2. Today its 2.30   Mild normocytic anemia:  Hemoglobin around 11.    Hypothyroidism: Resume synthroid.    H/o Breast cancer: Resume tamoxifen.     Thank you for this consultation.  Our [facility] hospitalist team will follow the patient with you.   Time Spent: 35 minutes.   Hosie Poisson M.D. [facility] 06/04/2017, 6:21 PM

## 2017-06-04 NOTE — Anesthesia Procedure Notes (Signed)
Procedure Name: Intubation Date/Time: 06/04/2017 1:39 PM Performed by: Moshe Salisbury, CRNA Pre-anesthesia Checklist: Patient identified, Emergency Drugs available, Suction available and Patient being monitored Patient Re-evaluated:Patient Re-evaluated prior to induction Oxygen Delivery Method: Circle System Utilized Preoxygenation: Pre-oxygenation with 100% oxygen Induction Type: IV induction Ventilation: Mask ventilation without difficulty Laryngoscope Size: Mac and 3 Grade View: Grade I Tube type: Oral Tube size: 7.5 mm Number of attempts: 1 Airway Equipment and Method: Stylet and Oral airway Placement Confirmation: ETT inserted through vocal cords under direct vision,  positive ETCO2 and breath sounds checked- equal and bilateral Secured at: 21 cm Tube secured with: Tape Dental Injury: Teeth and Oropharynx as per pre-operative assessment

## 2017-06-04 NOTE — Transfer of Care (Signed)
Immediate Anesthesia Transfer of Care Note  Patient: Regina Porter  Procedure(s) Performed: L3-4 TRANSFORAMINAL LUMBAR INTERBODY FUSION, CAGE, PEDICLE INSTRUMENTATION, GILL PROCEDURE (N/A )  Patient Location: PACU  Anesthesia Type:General  Level of Consciousness: awake, alert  and responds to stimulation  Airway & Oxygen Therapy: Patient Spontanous Breathing  Post-op Assessment: Report given to RN and Post -op Vital signs reviewed and stable  Post vital signs: Reviewed and stable  Last Vitals:  Vitals Value Taken Time  BP 188/63 06/04/2017  4:52 PM  Temp    Pulse 89 06/04/2017  4:54 PM  Resp 12 06/04/2017  4:54 PM  SpO2 100 % 06/04/2017  4:54 PM  Vitals shown include unvalidated device data.  Last Pain:  Vitals:   06/04/17 1042  TempSrc: Oral  PainSc: 0-No pain      Patients Stated Pain Goal: 3 (46/65/99 3570)  Complications: No apparent anesthesia complications

## 2017-06-04 NOTE — Op Note (Signed)
Preop diagnosis: L3-4 instability with severe central and foraminal stenosis, with neurogenic claudication.  Postop diagnosis: Same  Procedure: L3-4 decompression for unstable posterior elements Gordy Levan Procedure).  Transforaminal interbody fusion with local bone and cage.  Pedicle instrumentation.  Bilateral lateral intertransverse process fusion. Microscope assisted.   Surgeon: Rodell Perna, MD  Assistant: [name], PA-C medically necessary and present for the entire procedure.  EBL: 100 cc.  Anesthesia General plus Marcaine local  Implants  Depuy Synthes Matrix  pedicle instrumentation with 6 x 45 mm screws x4.  10 mm lordotic T-PAL cage.  45 mm rods x2.  Complications: None.  Procedure after induction general anesthesia orotracheal ablation preoperative Ancef patient was placed on the spine frame careful padding positioning arms at 9090 yellow roll pads underneath the shoulders padding over the ulnar nerve.  SCDs for calf compression.  Areas prepped with DuraPrep sterile skin marker in the midline for towels square the area Betadine Steri-Drape laminectomy sheets drapes were applied.  Timeout procedure completed.  Midline incision was made sub-proximal dissection down the L3-4 level and Kocher clamp was initially placed which was a little high above the level.  Second Kocher clamp was placed adjusted and until there was at the region for decompression above and below the L3-4 disc space and once his x-ray was obtained the bone was marked with a purple skin markers.  Subperiosteal dissection onto out to the lamina was performed of the facets and transverse processes were identified.  Posterior elements were decompressed removing the facet on the left side.  Patient had hypertrophic's facets in the posterior element was hypermobile consistent with 3 to 6 mm of shifting noted on patient's radiographs.  Facet was removed on the left side.  Disc was visualized Penfield 4 was poked in the disc confirm  with lateral x-ray confirming we had the appropriate L3-4 level.  Continue decompression laterally until there was room for exposure to the midline.  Epidural veins were coagulated with the bipolar cautery meticulous hemostasis was performed with patient's history of renal disease with elevated creatinine to minimize blood loss and potential for possible hypotension.  Epidural veins were coagulated annulus was opened up.  Straight up-biting pituitaries rasps, ring curettes were all sequentially used.  Rasping endplates curetting the endplates progressing up on trials to a 10 cage which gave a nice tight fit and restoration of disc space height and there was greater than 50% reduction of the anterolisthesis that have been present with the cage in position.  This was the trial and then bone that have been decorticated from the posterior element using osteotome as well as Kerrison rongeurs as well as the facet was meticulously placed in small pieces of bone packed into the disc space anterior and then cage was advanced checked as soon as the posterior aspect was flushed with about posterior vertebral body picture was taken and then it was kicked over until the lines were almost parallel cages in the middle of the disc space and bone was packed anteriorly with copious bone.  Distal left some bone for the lateral gutter fusion.  Pedicles were identified checked under C arm with the awl.  Pedicle Blanch Media tip was advanced by hand on the pedicle check with a pedicle feeler checked under C arm tapping checking with the pedicle feeler again feeling along the medial wall of the canal underneath and medial pedicle with the [facility].  Decortication of the transverse process followed by placement of the 6 x 45 mm screw.  This was repeated x4 and then final pictures taken after 45 mm rods were placed.  Screws are in good position AP and lateral.  Rods were tightened down on the left side where the cage was inserted first with  compression.  OpSite was then compressed screws were locked down to the clinic.  Final spot pictures were taken with a pickups on the right side.  All screws showed convergence and were directly down the pedicle.  Gutters were checked there was good decompression operative microscope it been used for the decompression removal of the removal of thick chunks of ligament that was causing the severe spinal stenosis as well as overhanging facet joints from the degenerative facets with instability.  Gutters were clean copious irrigation.  100 cc blood loss and a decision had not been made whether she would get any re-transfusion of the minimal amount.  Epidural space was dry fascia was closed with #1 Vicryl 2-0 Vicryl subtenons tissue skin skin staple closure postop dressing and transferred to the recovery room.

## 2017-06-04 NOTE — H&P (Signed)
Regina Porter is an 83 y.o. female.   Chief Complaint: Back pain and leg pain HPI: Patient with history of L3-4 stenosis and the above complaint presented to our office for preop evaluation for L3-4 transforaminal lumbar interbody fusion.  Progressively worsening symptoms.  Failed conservative treatment Past Medical History:  Diagnosis Date  . Anemia   . Anterolisthesis    grade 1: L3-4  . Anxiety   . Arthritis   . Breast cancer ([place])   . Chronic kidney disease   . Dementia   . Depression   . Dyspnea    W/ PAIN   . Gastroesophageal reflux disease   . Headache    TO SEE NEURO MD  . Heartburn   . Hypertension   . Hypothyroidism   . Leg cramps   . Post-menopause bleeding   . Restless leg syndrome 10/07/2014    Past Surgical History:  Procedure Laterality Date  . BREAST SURGERY Left    Lumpectomy   . CATARACT EXTRACTION, BILATERAL    . hysteroscopy biopsy      Family History  Problem Relation Age of Onset  . Hypertension Mother   . Arthritis Mother   . Heart disease Mother   . Hypertension Father   . Heart disease Father   . Cancer Brother   . Breast cancer Paternal Aunt    Social History:  reports that she has quit smoking. She has a 2.00 pack-year smoking history. She has never used smokeless tobacco. She reports that she does not drink alcohol or use drugs.  Allergies:  Allergies  Allergen Reactions  . Shellfish Allergy Swelling and Rash    MOUTH  . Neomy-Bacit-Polymyx-Pramoxine     UNSPECIFIED REACTION  Not sure which antibiotic she has a reaction to    Medications Prior to Admission  Medication Sig Dispense Refill  . acetaminophen (TYLENOL) 325 MG tablet Take 650 mg by mouth every 8 (eight) hours as needed (pain).     Marland Kitchen amLODipine (NORVASC) 10 MG tablet TAKE 1 TABLET BY MOUTH EVERY DAY 30 tablet 5  . Ascorbic Acid (VITAMIN C) 100 MG tablet Take 100 mg by mouth daily.    Marland Kitchen aspirin EC 81 MG tablet Take 81 mg by mouth daily.    . bumetanide (BUMEX) 2 MG  tablet Take 2 mg by mouth daily.  6  . busPIRone (BUSPAR) 7.5 MG tablet TAKE 1 TABLET BY MOUTH TWICE A DAY 180 tablet 0  . Cod Liver Oil 1000 MG CAPS Take 1 capsule by mouth 2 (two) times daily.     Marland Kitchen donepezil (ARICEPT) 5 MG tablet TAKE 1 TABLET (5 MG TOTAL) BY MOUTH AT BEDTIME. (Patient taking differently: Take 5 mg by mouth daily. ) 90 tablet 3  . famotidine (PEPCID) 20 MG tablet Take 20 mg by mouth 2 (two) times daily.  3  . famotidine (PEPCID) 20 MG tablet TAKE 1 TABLET (20 MG TOTAL) BY MOUTH 2 (TWO) TIMES DAILY. FOR ACID REFLUX 60 tablet 3  . levothyroxine (SYNTHROID, LEVOTHROID) 100 MCG tablet TAKE 1 TABLET (100 MCG TOTAL) BY MOUTH DAILY BEFORE BREAKFAST. 90 tablet 2  . losartan (COZAAR) 100 MG tablet Take 100 mg by mouth daily.    . metoprolol succinate (TOPROL-XL) 100 MG 24 hr tablet Take 100 mg by mouth at bedtime.  3  . ranitidine (ZANTAC) 150 MG tablet Take 150 mg by mouth 2 (two) times daily as needed for heartburn.     Marland Kitchen rOPINIRole (REQUIP) 1 MG tablet  TAKE 1 TABLET (1 MG TOTAL) BY MOUTH AT BEDTIME. 90 tablet 0  . tamoxifen (NOLVADEX) 20 MG tablet Take 20 mg by mouth daily.  11  . Tetrahydrozoline HCl (VISINE OP) Place 1 drop into both eyes daily as needed (dry eyes).    . traMADol (ULTRAM) 50 MG tablet Take 1 tablet (50 mg total) by mouth every 8 (eight) hours as needed. 90 tablet 0  . traZODone (DESYREL) 50 MG tablet TAKE 1 TABLET (50 MG TOTAL) BY MOUTH AT BEDTIME. 30 tablet 3  . vitamin A ([employer] VITAMIN A) 10000 UNIT capsule Take 10,000 Units by mouth daily.     . hydrALAZINE (APRESOLINE) 10 MG tablet TAKE 1 TABLET BY MOUTH THREE TIMES A DAY (Patient not taking: Reported on 05/27/2017) 90 tablet 6  . losartan (COZAAR) 50 MG tablet Take 1 tablet (50 mg total) by mouth daily. (Patient not taking: Reported on 05/27/2017) 90 tablet 1  . metoprolol succinate (TOPROL-XL) 50 MG 24 hr tablet TAKE 1 TABLET BY MOUTH DAILY WITH OR IMMEDIATELY FOLLOWING A MEAL (Patient not taking: Reported on  05/27/2017) 90 tablet 1    No results found for this or any previous visit (from the past 48 hour(s)). No results found.  Review of Systems  Constitutional: Negative.   HENT: Negative.   Respiratory: Negative.   Cardiovascular: Negative.   Genitourinary: Negative.   Musculoskeletal: Positive for back pain.  Skin: Negative.   Neurological: Positive for tingling.  Psychiatric/Behavioral: Negative.     Blood pressure (!) 160/49, pulse 84, temperature 98.2 F (36.8 C), temperature source Oral, resp. rate 18, height 5\' 5"  (1.651 m), weight 184 lb 8 oz (83.7 kg), SpO2 100 %. Physical Exam  Constitutional: She is oriented to person, place, and time. No distress.  HENT:  Head: Normocephalic and atraumatic.  Eyes: Pupils are equal, round, and reactive to light.  Neck: Normal range of motion.  Cardiovascular: Normal rate.  Respiratory: Effort normal and breath sounds normal. No respiratory distress. She has no wheezes.  GI: Soft. She exhibits no distension.  Musculoskeletal: She exhibits tenderness.  Neurological: She is alert and oriented to person, place, and time.  Skin: Skin is warm and dry.  Psychiatric: She has a normal mood and affect.     Assessment/Plan L3-4 stenosis   We will proceed with L3-4 TRANSFORAMINAL LUMBAR INTERBODY FUSION, CAGE, PEDICLE INSTRUMENTATION, GILL PROCEDURE as scheduled.  Possible rehab/recovery time discussed.  All questions answered.  [name], PA-C 06/04/2017, 12:12 PM

## 2017-06-04 NOTE — Interval H&P Note (Signed)
History and Physical Interval Note:  06/04/2017 12:25 PM  Regina Porter  has presented today for surgery, with the diagnosis of L3-4 severe stenosis, instability  The various methods of treatment have been discussed with the patient and family. After consideration of risks, benefits and other options for treatment, the patient has consented to  Procedure(s): L3-4 TRANSFORAMINAL LUMBAR INTERBODY FUSION, CAGE, PEDICLE INSTRUMENTATION, GILL PROCEDURE (N/A) as a surgical intervention .  The patient's history has been reviewed, patient examined, no change in status, stable for surgery.  I have reviewed the patient's chart and labs.  Questions were answered to the patient's satisfaction.     Marybelle Killings

## 2017-06-04 NOTE — Anesthesia Preprocedure Evaluation (Addendum)
Anesthesia Evaluation  Patient identified by MRN, date of birth, ID band Patient awake    Reviewed: Allergy & Precautions, NPO status , Patient's Chart, lab work & pertinent test results  Airway Mallampati: II  TM Distance: >3 FB Neck ROM: Full    Dental no notable dental hx.    Pulmonary former smoker,    Pulmonary exam normal breath sounds clear to auscultation       Cardiovascular hypertension, Normal cardiovascular exam Rhythm:Regular Rate:Normal  Echo 05/07/16 Left ventricle: The cavity size was normal. Wall thickness was   increased in a pattern of mild LVH. Systolic function was   vigorous. The estimated ejection fraction was in the range of 65%   to 70%. Wall motion was normal; t   Neuro/Psych Dementia    GI/Hepatic   Endo/Other    Renal/GU ESRFRenal diseaseNot on Dialysis     Musculoskeletal   Abdominal   Peds  Hematology   Anesthesia Other Findings   Reproductive/Obstetrics                             Lab Results  Component Value Date   WBC 5.2 05/30/2017   HGB 11.0 (L) 05/30/2017   HCT 33.9 (L) 05/30/2017   MCV 91.4 05/30/2017   PLT 274 05/30/2017    Lab Results  Component Value Date   CREATININE 2.81 (H) 05/30/2017   BUN 34 (H) 05/30/2017   NA 140 05/30/2017   K 4.0 05/30/2017   CL 106 05/30/2017   CO2 25 05/30/2017    Anesthesia Physical Anesthesia Plan  ASA: III  Anesthesia Plan: General   Post-op Pain Management:    Induction: Intravenous  PONV Risk Score and Plan: Treatment may vary due to age or medical condition  Airway Management Planned: Oral ETT  Additional Equipment:   Intra-op Plan:   Post-operative Plan: Extubation in OR  Informed Consent: I have reviewed the patients History and Physical, chart, labs and discussed the procedure including the risks, benefits and alternatives for the proposed anesthesia with the patient or authorized  representative who has indicated his/her understanding and acceptance.   Dental advisory given  Plan Discussed with: CRNA and Anesthesiologist  Anesthesia Plan Comments:         Anesthesia Quick Evaluation

## 2017-06-04 NOTE — Interval H&P Note (Signed)
History and Physical Interval Note:  06/04/2017 12:18 PM  Regina Porter  has presented today for surgery, with the diagnosis of L3-4 severe stenosis, instability  The various methods of treatment have been discussed with the patient and family. After consideration of risks, benefits and other options for treatment, the patient has consented to  Procedure(s): L3-4 TRANSFORAMINAL LUMBAR INTERBODY FUSION, CAGE, PEDICLE INSTRUMENTATION, GILL PROCEDURE (N/A) as a surgical intervention .  The patient's history has been reviewed, patient examined, no change in status, stable for surgery.  I have reviewed the patient's chart and labs.  Questions were answered to the patient's satisfaction.     Marybelle Killings

## 2017-06-05 DIAGNOSIS — E1022 Type 1 diabetes mellitus with diabetic chronic kidney disease: Secondary | ICD-10-CM

## 2017-06-05 DIAGNOSIS — N184 Chronic kidney disease, stage 4 (severe): Secondary | ICD-10-CM

## 2017-06-05 LAB — CBC
HCT: 28.2 % — ABNORMAL LOW (ref 36.0–46.0)
Hemoglobin: 9.3 g/dL — ABNORMAL LOW (ref 12.0–15.0)
MCH: 30.2 pg (ref 26.0–34.0)
MCHC: 33 g/dL (ref 30.0–36.0)
MCV: 91.6 fL (ref 78.0–100.0)
Platelets: 223 10*3/uL (ref 150–400)
RBC: 3.08 MIL/uL — ABNORMAL LOW (ref 3.87–5.11)
RDW: 13.8 % (ref 11.5–15.5)
WBC: 10.1 10*3/uL (ref 4.0–10.5)

## 2017-06-05 LAB — BASIC METABOLIC PANEL
Anion gap: 7 (ref 5–15)
BUN: 27 mg/dL — ABNORMAL HIGH (ref 6–20)
CO2: 26 mmol/L (ref 22–32)
Calcium: 8.8 mg/dL — ABNORMAL LOW (ref 8.9–10.3)
Chloride: 108 mmol/L (ref 101–111)
Creatinine, Ser: 2.07 mg/dL — ABNORMAL HIGH (ref 0.44–1.00)
GFR calc Af Amer: 25 mL/min — ABNORMAL LOW (ref >60)
GFR calc non Af Amer: 21 mL/min — ABNORMAL LOW (ref >60)
Glucose, Bld: 149 mg/dL — ABNORMAL HIGH (ref 65–99)
Potassium: 5.4 mmol/L — ABNORMAL HIGH (ref 3.5–5.1)
Sodium: 141 mmol/L (ref 135–145)

## 2017-06-05 MED ORDER — PATIROMER SORBITEX CALCIUM 8.4 G PO PACK
8.4000 g | PACK | Freq: Once | ORAL | Status: AC
  Administered 2017-06-05: 8.4 g via ORAL
  Filled 2017-06-05: qty 1

## 2017-06-05 MED ORDER — HYDRALAZINE HCL 20 MG/ML IJ SOLN: 10.0000 mg | Freq: Four times a day (QID) | INTRAMUSCULAR | Status: DC | PRN

## 2017-06-05 MED ORDER — PATIROMER SORBITEX CALCIUM 8.4 G PO PACK: 8.4000 g | PACK | Freq: Every day | ORAL | Status: DC

## 2017-06-05 MED ORDER — HYDROCODONE-ACETAMINOPHEN 5-325 MG PO TABS
1.0000 | ORAL_TABLET | Freq: Once | ORAL | Status: AC
  Administered 2017-06-05: 1 via ORAL
  Filled 2017-06-05: qty 1

## 2017-06-05 MED ORDER — HYDROCODONE-ACETAMINOPHEN 5-325 MG PO TABS: 1.0000 | ORAL_TABLET | Freq: Four times a day (QID) | ORAL | 0 refills | Status: DC | PRN

## 2017-06-05 MED ORDER — BUMETANIDE 2 MG PO TABS
2.0000 mg | ORAL_TABLET | Freq: Every day | ORAL | Status: DC
  Administered 2017-06-06 – 2017-06-08 (×3): 2 mg via ORAL
  Filled 2017-06-05 (×4): qty 1

## 2017-06-05 MED ORDER — FAMOTIDINE 20 MG PO TABS
20.0000 mg | ORAL_TABLET | Freq: Every day | ORAL | Status: DC
  Administered 2017-06-06 – 2017-06-07 (×2): 20 mg via ORAL
  Filled 2017-06-05 (×2): qty 1

## 2017-06-05 NOTE — Progress Notes (Signed)
Orthopedic Tech Progress Note Patient Details:  [name] 06/25/1934 [id number]  Patient ID: Burna Cash, female   DOB: Jan 02, 1935, 83 y.o.   MRN: [id number]   [employer] for lumbar brace. 06/05/2017, 11:27 AM

## 2017-06-05 NOTE — Progress Notes (Addendum)
   Subjective: 1 Day Post-Op Procedure(s) (LRB): L3-4 TRANSFORAMINAL LUMBAR INTERBODY FUSION, CAGE, PEDICLE INSTRUMENTATION, GILL PROCEDURE (N/A) Patient reports pain as mild.   Back is sore at incision.   Objective: Vital signs in last 24 hours: Temp:  [97.5 F (36.4 C)-99.6 F (37.6 C)] 99.6 F (37.6 C) (05/23 0512) Pulse Rate:  [83-97] 84 (05/23 0512) Resp:  [9-18] 16 (05/22 1930) BP: (158-188)/(37-125) 166/37 (05/23 0512) SpO2:  [92 %-100 %] 100 % (05/23 0512) Weight:  [184 lb 8 oz (83.7 kg)] 184 lb 8 oz (83.7 kg) (05/22 1042)  Intake/Output from previous day: 05/22 0701 - 05/23 0700 In: 2078.5 [I.V.:1778.5; IV Piggyback:100] Out: 1875 [Urine:1325; Emesis/NG output:450; Blood:100] Intake/Output this shift: No intake/output data recorded.  Recent Labs    06/05/17 0357  HGB 9.3*   Recent Labs    06/05/17 0357  WBC 10.1  RBC 3.08*  HCT 28.2*  PLT 223   Recent Labs    06/04/17 1701 06/05/17 0357  NA 141 141  K 4.4 5.4*  CL 109 108  CO2 25 26  BUN 31* 27*  CREATININE 2.30* 2.07*  GLUCOSE 179* 149*  CALCIUM 8.7* 8.8*   No results for input(s): LABPT, INR in the last 72 hours.  Neurologically intact Dg Lumbar Spine 2-3 Views  Result Date: 06/04/2017 CLINICAL DATA:  Lumbar fusion EXAM: LUMBAR SPINE - 2-3 VIEW; DG C-ARM 61-120 MIN COMPARISON:  06/25/2016 FLUOROSCOPY TIME:  Fluoroscopy Time:  24 seconds Radiation Exposure Index (if provided by the fluoroscopic device): Not available Number of Acquired Spot Images: 2 FINDINGS: Pedicle screws are noted at L3 and L4 with interbody fusion and posterior fixation. The numbering nomenclature is similar to that utilized on prior MRI. IMPRESSION: Lumbar fusion. Electronically Signed   By: Inez Catalina M.D.   On: 06/04/2017 16:25   Dg C-arm 1-60 Min  Result Date: 06/04/2017 CLINICAL DATA:  Lumbar fusion EXAM: LUMBAR SPINE - 2-3 VIEW; DG C-ARM 61-120 MIN COMPARISON:  06/25/2016 FLUOROSCOPY TIME:  Fluoroscopy Time:  24  seconds Radiation Exposure Index (if provided by the fluoroscopic device): Not available Number of Acquired Spot Images: 2 FINDINGS: Pedicle screws are noted at L3 and L4 with interbody fusion and posterior fixation. The numbering nomenclature is similar to that utilized on prior MRI. IMPRESSION: Lumbar fusion. Electronically Signed   By: Inez Catalina M.D.   On: 06/04/2017 16:25   Dg C-arm 1-60 Min  Result Date: 06/04/2017 CLINICAL DATA:  Lumbar fusion EXAM: LUMBAR SPINE - 2-3 VIEW; DG C-ARM 61-120 MIN COMPARISON:  06/25/2016 FLUOROSCOPY TIME:  Fluoroscopy Time:  24 seconds Radiation Exposure Index (if provided by the fluoroscopic device): Not available Number of Acquired Spot Images: 2 FINDINGS: Pedicle screws are noted at L3 and L4 with interbody fusion and posterior fixation. The numbering nomenclature is similar to that utilized on prior MRI. IMPRESSION: Lumbar fusion. Electronically Signed   By: Inez Catalina M.D.   On: 06/04/2017 16:25    Assessment/Plan: 1 Day Post-Op Procedure(s) (LRB): L3-4 TRANSFORAMINAL LUMBAR INTERBODY FUSION, CAGE, PEDICLE INSTRUMENTATION, GILL PROCEDURE (N/A) Up with therapy. Renal function improved. Time to mobilize. Likely home with daughter tomorrow.   Regina Porter 06/05/2017, 7:43 AM

## 2017-06-05 NOTE — Anesthesia Postprocedure Evaluation (Signed)
Anesthesia Post Note  Patient: Denisa Evalina Field  Procedure(s) Performed: L3-4 TRANSFORAMINAL LUMBAR INTERBODY FUSION, CAGE, PEDICLE INSTRUMENTATION, GILL PROCEDURE (N/A )     Patient location during evaluation: PACU Anesthesia Type: General Level of consciousness: awake and alert Pain management: pain level controlled Vital Signs Assessment: post-procedure vital signs reviewed and stable Respiratory status: spontaneous breathing, nonlabored ventilation, respiratory function stable and patient connected to nasal cannula oxygen Cardiovascular status: blood pressure returned to baseline and stable Postop Assessment: no apparent nausea or vomiting Anesthetic complications: no    Last Vitals:  Vitals:   06/05/17 0512 06/05/17 0900  BP: (!) 166/37 (!) 171/62  Pulse: 84 83  Resp:    Temp: 37.6 C   SpO2: 100%     Last Pain:  Vitals:   06/05/17 0512  TempSrc: Oral  PainSc:                  Barnet Glasgow

## 2017-06-05 NOTE — Evaluation (Signed)
Physical Therapy Evaluation Patient Details Name: Regina Porter MRN: [id number] DOB: May 23, 1934 Today's Date: 06/05/2017   History of Present Illness  Pt is an 83 y/o female s/p L3-L4 TLIF. PMH including but not limited to dementia, HTN, breast cancer and CKD.   Clinical Impression  Pt presented supine in bed with HOB elevated, awake and willing to participate in therapy session. Prior to admission, pt reported that she ambulated with use of SPC intermittently. Pt currently requires min A for bed mobility, min guard for transfers and min guard for short distance ambulation within her room with RW. Pt's daughter present throughout session as well. Pt would continue to benefit from skilled physical therapy services at this time while admitted and after d/c to address the below listed limitations in order to improve overall safety and independence with functional mobility.     Follow Up Recommendations Home health PT;Supervision/Assistance - 24 hour    Equipment Recommendations  None recommended by PT;Other (comment)(has RW at home)    Recommendations for Other Services       Precautions / Restrictions Precautions Precautions: Back Precaution Booklet Issued: No Precaution Comments: PT reviewed 3/3 back precautions and log roll technique with pt's and pt's daugther Required Braces or Orthoses: Spinal Brace Spinal Brace: Lumbar corset;Applied in sitting position Restrictions Weight Bearing Restrictions: No      Mobility  Bed Mobility Overal bed mobility: Needs Assistance Bed Mobility: Rolling;Sidelying to Sit Rolling: Min guard Sidelying to sit: Min assist       General bed mobility comments: increased time and effort, cueing for log roll technique, assist to elevate trunk  Transfers Overall transfer level: Needs assistance Equipment used: Rolling walker (2 wheeled) Transfers: Sit to/from Stand Sit to Stand: Min guard         General transfer comment: increased time and  effort, cueing for safe hand placement, min guard for safety  Ambulation/Gait Ambulation/Gait assistance: Min guard Ambulation Distance (Feet): 30 Feet Assistive device: Rolling walker (2 wheeled) Gait Pattern/deviations: Step-through pattern;Decreased stride length Gait velocity: decreased Gait velocity interpretation: <1.31 ft/sec, indicative of household ambulator General Gait Details: pt with mild instability but no overt LOB or need for physical assistance, min guard for safety   Stairs            Wheelchair Mobility    Modified Rankin (Stroke Patients Only)       Balance Overall balance assessment: Needs assistance Sitting-balance support: Feet supported Sitting balance-Leahy Scale: Fair     Standing balance support: During functional activity;Bilateral upper extremity supported Standing balance-Leahy Scale: Poor                               Pertinent Vitals/Pain Pain Assessment: Faces Faces Pain Scale: Hurts even more Pain Location: back Pain Descriptors / Indicators: Sore Pain Intervention(s): Monitored during session;Repositioned    Home Living Family/patient expects to be discharged to:: Private residence Living Arrangements: Children Available Help at Discharge: Family;Available 24 hours/day Type of Home: House Home Access: Stairs to enter Entrance Stairs-Rails: Psychiatric nurse of Steps: 5 Home Layout: Two level Home Equipment: Shower seat;Cane - single point;Walker - 2 wheels      Prior Function Level of Independence: Independent with assistive device(s)         Comments: ambulates with SPC PRN     Hand Dominance        Extremity/Trunk Assessment   Upper Extremity Assessment Upper Extremity Assessment: Overall  WFL for tasks assessed    Lower Extremity Assessment Lower Extremity Assessment: Overall WFL for tasks assessed    Cervical / Trunk Assessment Cervical / Trunk Assessment: Other  exceptions Cervical / Trunk Exceptions: s/p lumbar sx  Communication   Communication: No difficulties  Cognition Arousal/Alertness: Awake/alert Behavior During Therapy: WFL for tasks assessed/performed Overall Cognitive Status: Within Functional Limits for tasks assessed                                        General Comments      Exercises     Assessment/Plan    PT Assessment Patient needs continued PT services  PT Problem List Decreased balance;Decreased mobility;Decreased coordination;Decreased safety awareness;Decreased knowledge of use of DME;Decreased knowledge of precautions;Pain       PT Treatment Interventions DME instruction;Gait training;Balance training;Stair training;Functional mobility training;Therapeutic exercise;Therapeutic activities;Neuromuscular re-education;Patient/family education    PT Goals (Current goals can be found in the Care Plan section)  Acute Rehab PT Goals Patient Stated Goal: decrease pain PT Goal Formulation: With patient/family Time For Goal Achievement: 06/19/17 Potential to Achieve Goals: Good    Frequency Min 5X/week   Barriers to discharge        Co-evaluation               AM-PAC PT "6 Clicks" Daily Activity  Outcome Measure Difficulty turning over in bed (including adjusting bedclothes, sheets and blankets)?: A Little Difficulty moving from lying on back to sitting on the side of the bed? : Unable Difficulty sitting down on and standing up from a chair with arms (e.g., wheelchair, bedside commode, etc,.)?: Unable Help needed moving to and from a bed to chair (including a wheelchair)?: A Little Help needed walking in hospital room?: A Little Help needed climbing 3-5 steps with a railing? : A Little 6 Click Score: 14    End of Session Equipment Utilized During Treatment: Back brace Activity Tolerance: Patient tolerated treatment well Patient left: in chair;with call bell/phone within reach;with  family/visitor present Nurse Communication: Mobility status PT Visit Diagnosis: Other abnormalities of gait and mobility (R26.89);Pain Pain - part of body: (back)    Time: 5909-3112 PT Time Calculation (min) (ACUTE ONLY): 30 min   Charges:   PT Evaluation $PT Eval Moderate Complexity: 1 Mod PT Treatments $Therapeutic Activity: 8-22 mins   PT G Codes:        Coleman, PT, DPT [place] 06/05/2017, 4:15 PM

## 2017-06-05 NOTE — Progress Notes (Signed)
PROGRESS NOTE        PATIENT DETAILS Name: Regina Porter Age: 83 y.o. Sex: female Date of Birth: 1934/03/15 Admit Date: 06/04/2017 Admitting Physician Marybelle Killings, MD WGN:[id number], Carlos American, NP  Brief Narrative: Patient is a 83 y.o. female who was admitted by the orthopedic service due to L 3-4 instability with severe central and foraminal stenosis along with neurogenic claudication-she underwent L3-4 decompression, the hospitalist service was subsequently consulted to manage her medical comorbidities.  Subjective: Apart from pain at the operative site-she has no other complaints.  No chest pain or shortness of breath.  Assessment/Plan: Lumbar spinal stenosis: Status post L3-L4 decompression on 5/22-defer care to primary service.  Mild hyperkalemia: Hold losartan-1 dose of Veltassa-recheck electrolytes tomorrow  Chronic kidney disease stage III-IV: Close to usual baseline-avoid nephrotoxic agents.  Follow  Hypertension: Uncontrolled-probably due to some component of pain.  Continue amlodipine, metoprolol-resume Bumex.  Will follow and adjust accordingly  Chronic diastolic heart failure: Appears euvolemic-resuming Bumex  Hypothyroidism: Continue levothyroxine  GERD: Continue PPI  History of breast cancer: Continue tamoxifen  DVT Prophylaxis: Defer to primary service  Code Status: Full code  Family Communication: None at bedside  Disposition Plan: Defer to primary service  Antimicrobial agents: Anti-infectives (From admission, onward)   Start     Dose/Rate Route Frequency Ordered Stop   06/04/17 2130  ceFAZolin (ANCEF) IVPB 2g/100 mL premix     2 g 200 mL/hr over 30 Minutes Intravenous Every 8 hours 06/04/17 2004 06/05/17 0630   06/04/17 1015  ceFAZolin (ANCEF) IVPB 2g/100 mL premix     2 g 200 mL/hr over 30 Minutes Intravenous On call to O.R. 06/04/17 1012 06/04/17 1329      Procedures: 5/22>> L3-4 decompression  Time spent: 25  minutes-Greater than 50% of this time was spent in counseling, explanation of diagnosis, planning of further management, and coordination of care.  MEDICATIONS: Scheduled Meds: . amLODipine  10 mg Oral Daily  . busPIRone  7.5 mg Oral BID  . docusate sodium  100 mg Oral BID  . donepezil  5 mg Oral Daily  . [START ON 06/06/2017] famotidine  20 mg Oral QHS  . levothyroxine  100 mcg Oral QAC breakfast  . metoprolol succinate  100 mg Oral QHS  . patiromer  8.4 g Oral Once  . rOPINIRole  1 mg Oral QHS  . sodium chloride flush  3 mL Intravenous Q12H  . tamoxifen  20 mg Oral Daily  . traZODone  50 mg Oral QHS   Continuous Infusions: . sodium chloride    . sodium chloride 85 mL/hr at 06/04/17 2054   PRN Meds:.acetaminophen **OR** acetaminophen, HYDROcodone-acetaminophen, menthol-cetylpyridinium **OR** phenol, ondansetron **OR** ondansetron (ZOFRAN) IV, polyethylene glycol, sodium chloride flush, tetrahydrozoline   PHYSICAL EXAM: Vital signs: Vitals:   06/04/17 1930 06/04/17 1952 06/05/17 0512 06/05/17 0900  BP: (!) 171/66 (!) 177/54 (!) 166/37 (!) 171/62  Pulse: 89 93 84 83  Resp: 16     Temp: 98 F (36.7 C) (!) 97.5 F (36.4 C) 99.6 F (37.6 C)   TempSrc:  Oral Oral   SpO2: 98% 98% 100%   Weight:      Height:       Filed Weights   06/04/17 1042  Weight: 83.7 kg (184 lb 8 oz)   Body mass index is 30.7 kg/m.   General  appearance :Awake, alert, not in any distress. Speech Clear.  Eyes:, pupils equally reactive to light and accomodation HEENT: Atraumatic and Normocephalic Neck: supple Resp:Good air entry bilaterally, no added sounds  CVS: S1 S2 regular, no murmurs.  GI: Bowel sounds present, Non tender and not distended with no gaurding, rigidity or rebound.No organomegaly Extremities: B/L Lower Ext shows no edema, both legs are warm to touch Neurology:  speech clear,Non focal, sensation is grossly intact. Psychiatric: Normal judgment and insight. Alert and oriented x 3.  Normal mood. Musculoskeletal:No digital cyanosis Skin:No Rash, warm and dry Wounds:N/A  I have personally reviewed following labs and imaging studies  LABORATORY DATA: CBC: Recent Labs  Lab 05/30/17 1451 06/05/17 0357  WBC 5.2 10.1  HGB 11.0* 9.3*  HCT 33.9* 28.2*  MCV 91.4 91.6  PLT 274 818    Basic Metabolic Panel: Recent Labs  Lab 05/30/17 1451 06/04/17 1701 06/05/17 0357  NA 140 141 141  K 4.0 4.4 5.4*  CL 106 109 108  CO2 25 25 26   GLUCOSE 106* 179* 149*  BUN 34* 31* 27*  CREATININE 2.81* 2.30* 2.07*  CALCIUM 9.5 8.7* 8.8*    GFR: Estimated Creatinine Clearance: 22.8 mL/min (A) (by C-G formula based on SCr of 2.07 mg/dL (H)).  Liver Function Tests: Recent Labs  Lab 05/30/17 1451  AST 24  ALT 19  ALKPHOS 36*  BILITOT 0.5  PROT 7.7  ALBUMIN 3.8   No results for input(s): LIPASE, AMYLASE in the last 168 hours. No results for input(s): AMMONIA in the last 168 hours.  Coagulation Profile: Recent Labs  Lab 05/30/17 1451  INR 1.00    Cardiac Enzymes: No results for input(s): CKTOTAL, CKMB, CKMBINDEX, TROPONINI in the last 168 hours.  BNP (last 3 results) No results for input(s): PROBNP in the last 8760 hours.  HbA1C: No results for input(s): HGBA1C in the last 72 hours.  CBG: No results for input(s): GLUCAP in the last 168 hours.  Lipid Profile: No results for input(s): CHOL, HDL, LDLCALC, TRIG, CHOLHDL, LDLDIRECT in the last 72 hours.  Thyroid Function Tests: No results for input(s): TSH, T4TOTAL, FREET4, T3FREE, THYROIDAB in the last 72 hours.  Anemia Panel: No results for input(s): VITAMINB12, FOLATE, FERRITIN, TIBC, IRON, RETICCTPCT in the last 72 hours.  Urine analysis:    Component Value Date/Time   COLORURINE STRAW (A) 05/30/2017 1450   APPEARANCEUR CLEAR 05/30/2017 1450   LABSPEC 1.006 05/30/2017 1450   PHURINE 5.0 05/30/2017 1450   GLUCOSEU NEGATIVE 05/30/2017 1450   HGBUR NEGATIVE 05/30/2017 1450   BILIRUBINUR NEGATIVE  05/30/2017 1450   BILIRUBINUR negative 12/19/2014 1411   KETONESUR NEGATIVE 05/30/2017 1450   PROTEINUR NEGATIVE 05/30/2017 1450   UROBILINOGEN negative 12/19/2014 1411   NITRITE NEGATIVE 05/30/2017 1450   LEUKOCYTESUR NEGATIVE 05/30/2017 1450    Sepsis Labs: Lactic Acid, Venous No results found for: LATICACIDVEN  MICROBIOLOGY: Recent Results (from the past 240 hour(s))  Surgical pcr screen     Status: None   Collection Time: 05/30/17  2:50 PM  Result Value Ref Range Status   MRSA, PCR NEGATIVE NEGATIVE Final   Staphylococcus aureus NEGATIVE NEGATIVE Final    Comment: (NOTE) The Xpert SA Assay (FDA approved for NASAL specimens in patients 11 years of age and older), is one component of a comprehensive surveillance program. It is not intended to diagnose infection nor to guide or monitor treatment. Performed at [facility], [place] [address], [place], [place] [address]  RADIOLOGY STUDIES/RESULTS: Dg Chest 2 View  Result Date: 05/31/2017 CLINICAL DATA:  Preop testing. Pt is scheduled to have surgery on her L-spine on Wednesday 06/04/17. Hx; breast cancer, HTN, heartburn, breast surgery-lumpectomy (left), former smoker of .1 packs/day for 20 years, pt states she quit 6 or 7 years ago (2012 or 2013) EXAM: CHEST - 2 VIEW COMPARISON:  Chest CT 05/01/2015 FINDINGS: Normal mediastinum and cardiac silhouette. Normal pulmonary vasculature. No evidence of effusion, infiltrate, or pneumothorax. No acute bony abnormality. Surgical clips in the LEFT breast. IMPRESSION: No active cardiopulmonary disease. Electronically Signed   By: Suzy Bouchard M.D.   On: 05/31/2017 17:37   Dg Lumbar Spine 2-3 Views  Result Date: 06/04/2017 CLINICAL DATA:  Lumbar fusion EXAM: LUMBAR SPINE - 2-3 VIEW; DG C-ARM 61-120 MIN COMPARISON:  06/25/2016 FLUOROSCOPY TIME:  Fluoroscopy Time:  24 seconds Radiation Exposure Index (if provided by the fluoroscopic device): Not available Number of Acquired Spot  Images: 2 FINDINGS: Pedicle screws are noted at L3 and L4 with interbody fusion and posterior fixation. The numbering nomenclature is similar to that utilized on prior MRI. IMPRESSION: Lumbar fusion. Electronically Signed   By: Inez Catalina M.D.   On: 06/04/2017 16:25   Dg C-arm 1-60 Min  Result Date: 06/04/2017 CLINICAL DATA:  Lumbar fusion EXAM: LUMBAR SPINE - 2-3 VIEW; DG C-ARM 61-120 MIN COMPARISON:  06/25/2016 FLUOROSCOPY TIME:  Fluoroscopy Time:  24 seconds Radiation Exposure Index (if provided by the fluoroscopic device): Not available Number of Acquired Spot Images: 2 FINDINGS: Pedicle screws are noted at L3 and L4 with interbody fusion and posterior fixation. The numbering nomenclature is similar to that utilized on prior MRI. IMPRESSION: Lumbar fusion. Electronically Signed   By: Inez Catalina M.D.   On: 06/04/2017 16:25   Dg C-arm 1-60 Min  Result Date: 06/04/2017 CLINICAL DATA:  Lumbar fusion EXAM: LUMBAR SPINE - 2-3 VIEW; DG C-ARM 61-120 MIN COMPARISON:  06/25/2016 FLUOROSCOPY TIME:  Fluoroscopy Time:  24 seconds Radiation Exposure Index (if provided by the fluoroscopic device): Not available Number of Acquired Spot Images: 2 FINDINGS: Pedicle screws are noted at L3 and L4 with interbody fusion and posterior fixation. The numbering nomenclature is similar to that utilized on prior MRI. IMPRESSION: Lumbar fusion. Electronically Signed   By: Inez Catalina M.D.   On: 06/04/2017 16:25     LOS: 1 day   Oren Binet, MD  [facility]  If 7PM-7AM, please contact night-coverage  Please page via [email or web address]-Password [email or web address]-click on MD name and type text message  06/05/2017, 9:54 AM

## 2017-06-05 NOTE — Discharge Instructions (Signed)
Ambulate daily. OK to shower then reapply dressing as needed . Use brace when walking and upright. Walk daily and gradually increase distance. See Dr. Lorin Mercy in one week

## 2017-06-05 NOTE — Progress Notes (Signed)
Physical Therapy Treatment Patient Details Name: Regina Porter MRN: [id number] DOB: Jun 08, 1934 Today's Date: 06/05/2017    History of Present Illness Pt is an 83 y/o female s/p L3-L4 TLIF. PMH including but not limited to dementia, HTN, breast cancer and CKD.     PT Comments    PT returned for a second session at the request of family for assistance to return to bed from chair. Pt very limited this session secondary to pain and unable to ambulate further. PT reviewed back precautions with pt and log roll technique. Pt would continue to benefit from skilled physical therapy services at this time while admitted and after d/c to address the below listed limitations in order to improve overall safety and independence with functional mobility.    Follow Up Recommendations  Home health PT;Supervision/Assistance - 24 hour     Equipment Recommendations  None recommended by PT;Other (comment)    Recommendations for Other Services       Precautions / Restrictions Precautions Precautions: Back Precaution Booklet Issued: No Precaution Comments: PT reviewed 3/3 back precautions and log roll technique with pt's and pt's daugther Required Braces or Orthoses: Spinal Brace Spinal Brace: Lumbar corset;Applied in sitting position Restrictions Weight Bearing Restrictions: No    Mobility  Bed Mobility Overal bed mobility: Needs Assistance Bed Mobility: Rolling;Sit to Sidelying Rolling: Min guard Sidelying to sit: Min assist     Sit to sidelying: Min assist General bed mobility comments: increased time and effort, cueing for log roll technique, assist to return bilateral LEs onto bed  Transfers Overall transfer level: Needs assistance Equipment used: Rolling walker (2 wheeled) Transfers: Sit to/from Stand Sit to Stand: Min guard         General transfer comment: increased time and effort, cueing for safe hand placement, min guard for safety  Ambulation/Gait Ambulation/Gait assistance:  Min guard Ambulation Distance (Feet): 5 Feet Assistive device: Rolling walker (2 wheeled) Gait Pattern/deviations: Step-through pattern;Decreased stride length Gait velocity: decreased Gait velocity interpretation: <1.31 ft/sec, indicative of household ambulator General Gait Details: pt very painful this session and unable to tolerate further ambulation; requesting assistance to return to bed from chair   Stairs             Wheelchair Mobility    Modified Rankin (Stroke Patients Only)       Balance Overall balance assessment: Needs assistance Sitting-balance support: Feet supported Sitting balance-Leahy Scale: Fair     Standing balance support: During functional activity;Bilateral upper extremity supported Standing balance-Leahy Scale: Poor                              Cognition Arousal/Alertness: Awake/alert Behavior During Therapy: WFL for tasks assessed/performed Overall Cognitive Status: Within Functional Limits for tasks assessed                                        Exercises      General Comments        Pertinent Vitals/Pain Pain Assessment: Faces Faces Pain Scale: Hurts even more Pain Location: back Pain Descriptors / Indicators: Sore Pain Intervention(s): Monitored during session;Repositioned    Home Living Family/patient expects to be discharged to:: Private residence Living Arrangements: Children Available Help at Discharge: Family;Available 24 hours/day Type of Home: House Home Access: Stairs to enter Entrance Stairs-Rails: Right;Left Home Layout: Two level Home Equipment: Shower  seat;Cane - single point;Walker - 2 wheels      Prior Function Level of Independence: Independent with assistive device(s)      Comments: ambulates with SPC PRN   PT Goals (current goals can now be found in the care plan section) Acute Rehab PT Goals Patient Stated Goal: decrease pain PT Goal Formulation: With  patient/family Time For Goal Achievement: 06/19/17 Potential to Achieve Goals: Good Progress towards PT goals: Progressing toward goals    Frequency    Min 5X/week      PT Plan Current plan remains appropriate    Co-evaluation              AM-PAC PT "6 Clicks" Daily Activity  Outcome Measure  Difficulty turning over in bed (including adjusting bedclothes, sheets and blankets)?: None Difficulty moving from lying on back to sitting on the side of the bed? : Unable Difficulty sitting down on and standing up from a chair with arms (e.g., wheelchair, bedside commode, etc,.)?: Unable Help needed moving to and from a bed to chair (including a wheelchair)?: A Little Help needed walking in hospital room?: A Little Help needed climbing 3-5 steps with a railing? : A Lot 6 Click Score: 14    End of Session Equipment Utilized During Treatment: Back brace;Gait belt Activity Tolerance: Patient limited by pain Patient left: in bed;with call bell/phone within reach;with family/visitor present Nurse Communication: Mobility status PT Visit Diagnosis: Other abnormalities of gait and mobility (R26.89);Pain Pain - part of body: (back)     Time: 3887-1959 PT Time Calculation (min) (ACUTE ONLY): 15 min  Charges:  $Therapeutic Activity: 8-22 mins                    G Codes:       [place], [place], [place] 06/05/2017, 4:49 PM

## 2017-06-06 LAB — BASIC METABOLIC PANEL
Anion gap: 9 (ref 5–15)
BUN: 19 mg/dL (ref 6–20)
CO2: 23 mmol/L (ref 22–32)
Calcium: 8.7 mg/dL — ABNORMAL LOW (ref 8.9–10.3)
Chloride: 107 mmol/L (ref 101–111)
Creatinine, Ser: 1.67 mg/dL — ABNORMAL HIGH (ref 0.44–1.00)
GFR calc Af Amer: 32 mL/min — ABNORMAL LOW (ref >60)
GFR calc non Af Amer: 28 mL/min — ABNORMAL LOW (ref >60)
Glucose, Bld: 129 mg/dL — ABNORMAL HIGH (ref 65–99)
Potassium: 4.5 mmol/L (ref 3.5–5.1)
Sodium: 139 mmol/L (ref 135–145)

## 2017-06-06 MED ORDER — HYDRALAZINE HCL 25 MG PO TABS: 25.0000 mg | ORAL_TABLET | Freq: Three times a day (TID) | ORAL | Status: DC

## 2017-06-06 MED ORDER — HYDRALAZINE HCL 25 MG PO TABS
25.0000 mg | ORAL_TABLET | Freq: Three times a day (TID) | ORAL | Status: DC
  Administered 2017-06-06 – 2017-06-07 (×3): 25 mg via ORAL
  Filled 2017-06-06 (×3): qty 1

## 2017-06-06 MED FILL — Heparin Sodium (Porcine) Inj 1000 Unit/ML: INTRAMUSCULAR | Qty: 30 | Status: AC

## 2017-06-06 MED FILL — Sodium Chloride IV Soln 0.9%: INTRAVENOUS | Qty: 1000 | Status: AC

## 2017-06-06 NOTE — Progress Notes (Signed)
   Subjective: 2 Days Post-Op Procedure(s) (LRB): L3-4 TRANSFORAMINAL LUMBAR INTERBODY FUSION, CAGE, PEDICLE INSTRUMENTATION, GILL PROCEDURE (N/A) Patient reports pain as mild and moderate.    Objective: Vital signs in last 24 hours: Temp:  [98.1 F (36.7 C)-100.8 F (38.2 C)] 100.8 F (38.2 C) (05/24 0421) Pulse Rate:  [74-77] 77 (05/24 0421) Resp:  [16] 16 (05/23 1458) BP: (166-168)/(43-96) 168/96 (05/24 0421) SpO2:  [100 %] 100 % (05/24 0421)  Intake/Output from previous day: 05/23 0701 - 05/24 0700 In: 2690 [P.O.:480; I.V.:2210] Out: -  Intake/Output this shift: No intake/output data recorded.  Recent Labs    06/05/17 0357  HGB 9.3*   Recent Labs    06/05/17 0357  WBC 10.1  RBC 3.08*  HCT 28.2*  PLT 223   Recent Labs    06/05/17 0357 06/06/17 0456  NA 141 139  K 5.4* 4.5  CL 108 107  CO2 26 23  BUN 27* 19  CREATININE 2.07* 1.67*  GLUCOSE 149* 129*  CALCIUM 8.8* 8.7*   No results for input(s): LABPT, INR in the last 72 hours.  Neurologically intact No results found.  Assessment/Plan: 2 Days Post-Op Procedure(s) (LRB): L3-4 TRANSFORAMINAL LUMBAR INTERBODY FUSION, CAGE, PEDICLE INSTRUMENTATION, GILL PROCEDURE (N/A) Up with therapy, home today with daughter if she does well with therapy. If therapy states she is not ready she can go home Saturday.  Rx norco on chart.   Marybelle Killings 06/06/2017, 10:42 AM

## 2017-06-06 NOTE — Progress Notes (Signed)
PROGRESS NOTE        PATIENT DETAILS Name: Regina Porter Age: 83 y.o. Sex: female Date of Birth: 04-Sep-1934 Admit Date: 06/04/2017 Admitting Physician Marybelle Killings, MD BSJ:[id number], Carlos American, NP  Brief Narrative: Patient is a 83 y.o. female who was admitted by the orthopedic service due to L 3-4 instability with severe central and foraminal stenosis along with neurogenic claudication-she underwent L3-4 decompression, the hospitalist service was subsequently consulted to manage her medical comorbidities.  Subjective: Feels great-no major issues overnight.  Denies any chest pain or shortness of breath.  Still has some pain at the operative site.  Assessment/Plan: Lumbar spinal stenosis: Status post L3-L4 decompression on 5/22-defer care to primary service.  Mild hyperkalemia: Resolved-would continue to hold losartan on discharge.   Chronic kidney disease stage III-IV: Close to usual baseline-follow with PCP.   Hypertension: Still on the higher side-continue amlodipine, metoprolol, Bumex-I have added hydralazine-okay to continue all these agents on discharge and follow-up with PCP for further optimization   Chronic diastolic heart failure: Euvolemic-continue Bumex on discharge  Hypothyroidism: Continue levothyroxine  GERD: Continue PPI  History of breast cancer: Continue tamoxifen  Okay to discharge from our point of view-if patient is still here-we will follow.  DVT Prophylaxis: Defer to primary service  Code Status: Full code  Disposition Plan: Defer to primary service  Antimicrobial agents: Anti-infectives (From admission, onward)   Start     Dose/Rate Route Frequency Ordered Stop   06/04/17 2130  ceFAZolin (ANCEF) IVPB 2g/100 mL premix     2 g 200 mL/hr over 30 Minutes Intravenous Every 8 hours 06/04/17 2004 06/05/17 0630   06/04/17 1015  ceFAZolin (ANCEF) IVPB 2g/100 mL premix     2 g 200 mL/hr over 30 Minutes Intravenous On call to O.R.  06/04/17 1012 06/04/17 1329      Procedures: 5/22>> L3-4 decompression  Time spent: 25 minutes-Greater than 50% of this time was spent in counseling, explanation of diagnosis, planning of further management, and coordination of care.  MEDICATIONS: Scheduled Meds: . amLODipine  10 mg Oral Daily  . bumetanide  2 mg Oral Daily  . busPIRone  7.5 mg Oral BID  . docusate sodium  100 mg Oral BID  . donepezil  5 mg Oral Daily  . famotidine  20 mg Oral QHS  . hydrALAZINE  25 mg Oral Q8H  . levothyroxine  100 mcg Oral QAC breakfast  . metoprolol succinate  100 mg Oral QHS  . rOPINIRole  1 mg Oral QHS  . sodium chloride flush  3 mL Intravenous Q12H  . tamoxifen  20 mg Oral Daily  . traZODone  50 mg Oral QHS   Continuous Infusions: . sodium chloride    . sodium chloride 85 mL/hr at 06/05/17 1130   PRN Meds:.acetaminophen **OR** acetaminophen, hydrALAZINE, HYDROcodone-acetaminophen, menthol-cetylpyridinium **OR** phenol, ondansetron **OR** ondansetron (ZOFRAN) IV, polyethylene glycol, sodium chloride flush, tetrahydrozoline   PHYSICAL EXAM: Vital signs: Vitals:   06/05/17 0512 06/05/17 0900 06/05/17 1458 06/06/17 0421  BP: (!) 166/37 (!) 171/62 (!) 166/43 (!) 168/96  Pulse: 84 83 74 77  Resp:   16   Temp: 99.6 F (37.6 C)  98.1 F (36.7 C) (!) 100.8 F (38.2 C)  TempSrc: Oral  Oral Oral  SpO2: 100%  100% 100%  Weight:      Height:  Filed Weights   06/04/17 1042  Weight: 83.7 kg (184 lb 8 oz)   Body mass index is 30.7 kg/m.   General appearance:Awake, alert, not in any distress.  Eyes:no scleral icterus. HEENT: Atraumatic and Normocephalic Neck: supple, no JVD. Resp:Good air entry bilaterally,no rales or rhonchi CVS: S1 S2 regular, no murmurs.  GI: Bowel sounds present, Non tender and not distended with no gaurding, rigidity or rebound. Extremities: B/L Lower Ext shows no edema, both legs are warm to touch Neurology:  Non focal Psychiatric: Normal judgment and  insight. Normal mood. Musculoskeletal:No digital cyanosis Skin:No Rash, warm and dry Wounds:N/A  I have personally reviewed following labs and imaging studies  LABORATORY DATA: CBC: Recent Labs  Lab 05/30/17 1451 06/05/17 0357  WBC 5.2 10.1  HGB 11.0* 9.3*  HCT 33.9* 28.2*  MCV 91.4 91.6  PLT 274 500    Basic Metabolic Panel: Recent Labs  Lab 05/30/17 1451 06/04/17 1701 06/05/17 0357 06/06/17 0456  NA 140 141 141 139  K 4.0 4.4 5.4* 4.5  CL 106 109 108 107  CO2 25 25 26 23   GLUCOSE 106* 179* 149* 129*  BUN 34* 31* 27* 19  CREATININE 2.81* 2.30* 2.07* 1.67*  CALCIUM 9.5 8.7* 8.8* 8.7*    GFR: Estimated Creatinine Clearance: 28.2 mL/min (A) (by C-G formula based on SCr of 1.67 mg/dL (H)).  Liver Function Tests: Recent Labs  Lab 05/30/17 1451  AST 24  ALT 19  ALKPHOS 36*  BILITOT 0.5  PROT 7.7  ALBUMIN 3.8   No results for input(s): LIPASE, AMYLASE in the last 168 hours. No results for input(s): AMMONIA in the last 168 hours.  Coagulation Profile: Recent Labs  Lab 05/30/17 1451  INR 1.00    Cardiac Enzymes: No results for input(s): CKTOTAL, CKMB, CKMBINDEX, TROPONINI in the last 168 hours.  BNP (last 3 results) No results for input(s): PROBNP in the last 8760 hours.  HbA1C: No results for input(s): HGBA1C in the last 72 hours.  CBG: No results for input(s): GLUCAP in the last 168 hours.  Lipid Profile: No results for input(s): CHOL, HDL, LDLCALC, TRIG, CHOLHDL, LDLDIRECT in the last 72 hours.  Thyroid Function Tests: No results for input(s): TSH, T4TOTAL, FREET4, T3FREE, THYROIDAB in the last 72 hours.  Anemia Panel: No results for input(s): VITAMINB12, FOLATE, FERRITIN, TIBC, IRON, RETICCTPCT in the last 72 hours.  Urine analysis:    Component Value Date/Time   COLORURINE STRAW (A) 05/30/2017 1450   APPEARANCEUR CLEAR 05/30/2017 1450   LABSPEC 1.006 05/30/2017 1450   PHURINE 5.0 05/30/2017 1450   GLUCOSEU NEGATIVE 05/30/2017 1450     HGBUR NEGATIVE 05/30/2017 1450   BILIRUBINUR NEGATIVE 05/30/2017 1450   BILIRUBINUR negative 12/19/2014 1411   KETONESUR NEGATIVE 05/30/2017 1450   PROTEINUR NEGATIVE 05/30/2017 1450   UROBILINOGEN negative 12/19/2014 1411   NITRITE NEGATIVE 05/30/2017 1450   LEUKOCYTESUR NEGATIVE 05/30/2017 1450    Sepsis Labs: Lactic Acid, Venous No results found for: LATICACIDVEN  MICROBIOLOGY: Recent Results (from the past 240 hour(s))  Surgical pcr screen     Status: None   Collection Time: 05/30/17  2:50 PM  Result Value Ref Range Status   MRSA, PCR NEGATIVE NEGATIVE Final   Staphylococcus aureus NEGATIVE NEGATIVE Final    Comment: (NOTE) The Xpert SA Assay (FDA approved for NASAL specimens in patients 28 years of age and older), is one component of a comprehensive surveillance program. It is not intended to diagnose infection nor to guide or monitor  treatment. Performed at [facility], [place] [address], [place], [place] [address]     RADIOLOGY STUDIES/RESULTS: Dg Chest 2 View  Result Date: 05/31/2017 CLINICAL DATA:  Preop testing. Pt is scheduled to have surgery on her L-spine on Wednesday 06/04/17. Hx; breast cancer, HTN, heartburn, breast surgery-lumpectomy (left), former smoker of .1 packs/day for 20 years, pt states she quit 6 or 7 years ago (2012 or 2013) EXAM: CHEST - 2 VIEW COMPARISON:  Chest CT 05/01/2015 FINDINGS: Normal mediastinum and cardiac silhouette. Normal pulmonary vasculature. No evidence of effusion, infiltrate, or pneumothorax. No acute bony abnormality. Surgical clips in the LEFT breast. IMPRESSION: No active cardiopulmonary disease. Electronically Signed   By: Suzy Bouchard M.D.   On: 05/31/2017 17:37   Dg Lumbar Spine 2-3 Views  Result Date: 06/04/2017 CLINICAL DATA:  Lumbar fusion EXAM: LUMBAR SPINE - 2-3 VIEW; DG C-ARM 61-120 MIN COMPARISON:  06/25/2016 FLUOROSCOPY TIME:  Fluoroscopy Time:  24 seconds Radiation Exposure Index (if provided by the  fluoroscopic device): Not available Number of Acquired Spot Images: 2 FINDINGS: Pedicle screws are noted at L3 and L4 with interbody fusion and posterior fixation. The numbering nomenclature is similar to that utilized on prior MRI. IMPRESSION: Lumbar fusion. Electronically Signed   By: Inez Catalina M.D.   On: 06/04/2017 16:25   Dg C-arm 1-60 Min  Result Date: 06/04/2017 CLINICAL DATA:  Lumbar fusion EXAM: LUMBAR SPINE - 2-3 VIEW; DG C-ARM 61-120 MIN COMPARISON:  06/25/2016 FLUOROSCOPY TIME:  Fluoroscopy Time:  24 seconds Radiation Exposure Index (if provided by the fluoroscopic device): Not available Number of Acquired Spot Images: 2 FINDINGS: Pedicle screws are noted at L3 and L4 with interbody fusion and posterior fixation. The numbering nomenclature is similar to that utilized on prior MRI. IMPRESSION: Lumbar fusion. Electronically Signed   By: Inez Catalina M.D.   On: 06/04/2017 16:25   Dg C-arm 1-60 Min  Result Date: 06/04/2017 CLINICAL DATA:  Lumbar fusion EXAM: LUMBAR SPINE - 2-3 VIEW; DG C-ARM 61-120 MIN COMPARISON:  06/25/2016 FLUOROSCOPY TIME:  Fluoroscopy Time:  24 seconds Radiation Exposure Index (if provided by the fluoroscopic device): Not available Number of Acquired Spot Images: 2 FINDINGS: Pedicle screws are noted at L3 and L4 with interbody fusion and posterior fixation. The numbering nomenclature is similar to that utilized on prior MRI. IMPRESSION: Lumbar fusion. Electronically Signed   By: Inez Catalina M.D.   On: 06/04/2017 16:25     LOS: 2 days   Oren Binet, MD  [facility]  If 7PM-7AM, please contact night-coverage  Please page via [email or web address]-Password [email or web address]-click on MD name and type text message  06/06/2017, 9:34 AM

## 2017-06-06 NOTE — Progress Notes (Signed)
Physical Therapy Treatment Patient Details Name: Regina Porter MRN: [id number] DOB: June 27, 1934 Today's Date: 06/06/2017    History of Present Illness Pt is an 83 y/o female s/p L3-L4 TLIF. PMH including but not limited to dementia, HTN, breast cancer and CKD.     PT Comments    PT spent a significant amount of time with pt this session in an attempt to assess pt's readiness for discharge. Pt continues to require mod A for bed mobility, mod A for transfers, min guard for ambulation with RW and mod A to ascend/descend two steps. Pt is very limited secondary to pain. She moves very slowly, cautious and guarded with all movement. Pt will require 24/7 physical assistance upon d/c to help her with bed mobility, transfers, stair negotiation and ADLs. Pt would benefit from more therapy sessions prior to returning home. PT will continue to follow pt acutely to progress mobility as tolerated.  Pt would continue to benefit from skilled physical therapy services at this time while admitted and after d/c to address the below listed limitations in order to improve overall safety and independence with functional mobility.     Follow Up Recommendations  Home health PT;Supervision/Assistance - 24 hour     Equipment Recommendations  Hospital bed;3in1 (PT)    Recommendations for Other Services       Precautions / Restrictions Precautions Precautions: Back Precaution Booklet Issued: Yes (comment) Precaution Comments: PT reviewed 3/3 back precautions and log roll technique with pt and pt's daugther Required Braces or Orthoses: Spinal Brace Spinal Brace: Lumbar corset;Applied in sitting position Restrictions Weight Bearing Restrictions: No    Mobility  Bed Mobility Overal bed mobility: Needs Assistance Bed Mobility: Rolling;Sidelying to Sit;Sit to Sidelying Rolling: Min guard Sidelying to sit: Mod assist     Sit to sidelying: Mod assist General bed mobility comments: increased time and effort,  cueing for log roll technique, assist to elevate trunk and to return bilateral LEs onto bed  Transfers Overall transfer level: Needs assistance Equipment used: Rolling walker (2 wheeled) Transfers: Sit to/from Stand Sit to Stand: Mod assist         General transfer comment: increased time and effort, cueing for safe hand placement, mod A to rise into standing x1 from EOB and x1 from toilet  Ambulation/Gait Ambulation/Gait assistance: Min guard Ambulation Distance (Feet): 100 Feet Assistive device: Rolling walker (2 wheeled) Gait Pattern/deviations: Step-through pattern;Decreased stride length Gait velocity: decreased Gait velocity interpretation: <1.31 ft/sec, indicative of household ambulator General Gait Details: pt steady with RW, min guard, pt very cautious, guarded and slower   Stairs Stairs: Yes Stairs assistance: Mod assist Stair Management: Two rails;Step to pattern;Forwards Number of Stairs: 2 General stair comments: pt required mod A to ascend, min A to descend   Wheelchair Mobility    Modified Rankin (Stroke Patients Only)       Balance Overall balance assessment: Needs assistance Sitting-balance support: Feet supported;Bilateral upper extremity supported Sitting balance-Leahy Scale: Poor     Standing balance support: During functional activity;Bilateral upper extremity supported Standing balance-Leahy Scale: Poor                              Cognition Arousal/Alertness: Awake/alert Behavior During Therapy: WFL for tasks assessed/performed Overall Cognitive Status: Within Functional Limits for tasks assessed  Exercises      General Comments        Pertinent Vitals/Pain Pain Assessment: Faces Faces Pain Scale: Hurts whole lot Pain Location: back Pain Descriptors / Indicators: Sore Pain Intervention(s): Monitored during session;Repositioned;Patient requesting pain meds-RN  notified;RN gave pain meds during session    Home Living                      Prior Function            PT Goals (current goals can now be found in the care plan section) Acute Rehab PT Goals Patient Stated Goal: decrease pain PT Goal Formulation: With patient/family Time For Goal Achievement: 06/19/17 Potential to Achieve Goals: Good Progress towards PT goals: Progressing toward goals    Frequency    Min 5X/week      PT Plan Current plan remains appropriate    Co-evaluation              AM-PAC PT "6 Clicks" Daily Activity  Outcome Measure  Difficulty turning over in bed (including adjusting bedclothes, sheets and blankets)?: A Little Difficulty moving from lying on back to sitting on the side of the bed? : Unable Difficulty sitting down on and standing up from a chair with arms (e.g., wheelchair, bedside commode, etc,.)?: Unable Help needed moving to and from a bed to chair (including a wheelchair)?: A Little Help needed walking in hospital room?: A Little Help needed climbing 3-5 steps with a railing? : A Lot 6 Click Score: 13    End of Session Equipment Utilized During Treatment: Back brace;Gait belt Activity Tolerance: Patient limited by pain Patient left: in bed;with call bell/phone within reach;with family/visitor present Nurse Communication: Mobility status PT Visit Diagnosis: Other abnormalities of gait and mobility (R26.89);Pain Pain - part of body: (back)     Time: 5465-0354 PT Time Calculation (min) (ACUTE ONLY): 58 min  Charges:  $Gait Training: 23-37 mins $Therapeutic Activity: 23-37 mins                    G Codes:       [place], [place], [place] 06/06/2017, 3:43 PM

## 2017-06-06 NOTE — Care Management Note (Signed)
Case Management Note  Patient Details  Name: Regina Porter MRN: [id number] Date of Birth: 03-13-1934  Subjective/Objective:   83 yr old female s/p L3-4 Transforaminal Lumbar interfusion.                 Action/Plan: Case manager spoke with patient and daughter Deardrea. Choice for [place] was offered, referral was called to [place], [place] liaison. Patient and her daughters reside together, she will have family support when discharged. Has RW, CM has ordered 3in1.     Expected Discharge Date:   pending               Expected Discharge Plan:  [place]  In-House Referral:  NA  Discharge planning Services  CM Consult  Post Acute Care Choice:  Durable Medical Equipment, Home Health Choice offered to:  Patient, Adult Children  DME Arranged:  3-N-1 DME Agency:  [place]:  PT [place]:  [place]  Status of Service:  Completed, signed off  If discussed at [place] of Stay Meetings, dates discussed:    Additional Comments:  Ninfa Meeker, RN 06/06/2017, 10:10 AM

## 2017-06-07 MED ORDER — HYDRALAZINE HCL 50 MG PO TABS
50.0000 mg | ORAL_TABLET | Freq: Three times a day (TID) | ORAL | Status: DC
  Administered 2017-06-07 – 2017-06-08 (×4): 50 mg via ORAL
  Filled 2017-06-07 (×4): qty 1

## 2017-06-07 NOTE — Progress Notes (Addendum)
Subjective: 3 Days Post-Op Procedure(s) (LRB): L3-4 TRANSFORAMINAL LUMBAR INTERBODY FUSION, CAGE, PEDICLE INSTRUMENTATION, GILL PROCEDURE (N/A) Patient reports pain as mild. Poor pain control yesterday discharge held.   Objective: Vital signs in last 24 hours: Temp:  [98.7 F (37.1 C)-99.1 F (37.3 C)] 99.1 F (37.3 C) (05/25 0500) Pulse Rate:  [72-79] 72 (05/25 0500) Resp:  [22] 22 (05/24 1203) BP: (155-161)/(35-56) 160/35 (05/25 0504) SpO2:  [99 %-100 %] 99 % (05/25 0500)  Intake/Output from previous day: 05/24 0701 - 05/25 0700 In: 240 [P.O.:240] Out: 1500 [Urine:1500] Intake/Output this shift: No intake/output data recorded.  Recent Labs    06/05/17 0357  HGB 9.3*   Recent Labs    06/05/17 0357  WBC 10.1  RBC 3.08*  HCT 28.2*  PLT 223   Recent Labs    06/05/17 0357 06/06/17 0456  NA 141 139  K 5.4* 4.5  CL 108 107  CO2 26 23  BUN 27* 19  CREATININE 2.07* 1.67*  GLUCOSE 149* 129*  CALCIUM 8.8* 8.7*   No results for input(s): LABPT, INR in the last 72 hours.  Sensation intact distally Dorsiflexion/Plantar flexion intact Incision: dressing C/D/I Compartment soft   Assessment/Plan: 3 Days Post-Op Procedure(s) (LRB): L3-4 TRANSFORAMINAL LUMBAR INTERBODY FUSION, CAGE, PEDICLE INSTRUMENTATION, GILL PROCEDURE (N/A)  Up with PT then discharge to home  Discharge home with home health    Erskine Emery 06/07/2017, 8:35 AM

## 2017-06-07 NOTE — Progress Notes (Signed)
Physical Therapy Treatment Patient Details Name: Regina Porter MRN: [id number] DOB: [birth year] Today's Date: 06/07/2017    History of Present Illness Pt is an 83 y/o female s/p L3-L4 TLIF. PMH including but not limited to dementia, HTN, breast cancer and CKD.     PT Comments    Pt making good progress with gait and stair training this session. She continues to require min A for transfers and bed mobility; however, greatly improved since previous session. Plan is for d/c home today with support from family. Pt would continue to benefit from skilled physical therapy services at this time while admitted and after d/c to address the below listed limitations in order to improve overall safety and independence with functional mobility.  Patient is s/p lumbar fusion surgery which impairs their ability to be able to safely perform bed mobility (I.e. - rolling and sit<>sidelying) while maintaining her back precautions safely.  A hospital bed will allow the patient with family's assistance to be able to move in her bed as well as get into and out of bed in a safe manner while maintaining her back precautions.    Follow Up Recommendations  Home health PT;Supervision/Assistance - 24 hour     Equipment Recommendations  Hospital bed;3in1 (PT)    Recommendations for Other Services       Precautions / Restrictions Precautions Precautions: Back Precaution Booklet Issued: Yes (comment) Precaution Comments: PT reviewed 3/3 back precautions with pt and pt's daughter Required Braces or Orthoses: Spinal Brace Spinal Brace: Lumbar corset;Applied in sitting position Restrictions Weight Bearing Restrictions: No    Mobility  Bed Mobility Overal bed mobility: Needs Assistance Bed Mobility: Rolling;Sit to Sidelying Rolling: Supervision       Sit to sidelying: Mod assist General bed mobility comments: increased time and effort, cueing for log roll technique, assist to return bilateral LEs onto  bed  Transfers Overall transfer level: Needs assistance Equipment used: Rolling walker (2 wheeled) Transfers: Sit to/from Stand Sit to Stand: Min assist         General transfer comment: increased time and effort, good technique, min A to rise from recliner chair  Ambulation/Gait Ambulation/Gait assistance: Min guard Ambulation Distance (Feet): 100 Feet Assistive device: Rolling walker (2 wheeled) Gait Pattern/deviations: Step-through pattern;Decreased stride length Gait velocity: decreased Gait velocity interpretation: <1.31 ft/sec, indicative of household ambulator General Gait Details: pt steady with RW, min guard, pt very cautious, guarded and slow   Stairs Stairs: Yes Stairs assistance: Min guard Stair Management: Two rails;Step to pattern;Forwards Number of Stairs: 2 General stair comments: min guard this session; daughter present   Wheelchair Mobility    Modified Rankin (Stroke Patients Only)       Balance Overall balance assessment: Needs assistance Sitting-balance support: Feet supported;Bilateral upper extremity supported Sitting balance-Leahy Scale: Poor     Standing balance support: During functional activity;Bilateral upper extremity supported Standing balance-Leahy Scale: Poor                              Cognition Arousal/Alertness: Awake/alert Behavior During Therapy: WFL for tasks assessed/performed Overall Cognitive Status: Within Functional Limits for tasks assessed                                        Exercises      General Comments        Pertinent  Vitals/Pain Pain Assessment: Faces Faces Pain Scale: Hurts little more Pain Location: back Pain Descriptors / Indicators: Sore Pain Intervention(s): Monitored during session;Repositioned    Home Living                      Prior Function            PT Goals (current goals can now be found in the care plan section) Acute Rehab PT Goals PT  Goal Formulation: With patient/family Time For Goal Achievement: 06/19/17 Potential to Achieve Goals: Good Progress towards PT goals: Progressing toward goals    Frequency    Min 5X/week      PT Plan Current plan remains appropriate    Co-evaluation              AM-PAC PT "6 Clicks" Daily Activity  Outcome Measure  Difficulty turning over in bed (including adjusting bedclothes, sheets and blankets)?: A Little Difficulty moving from lying on back to sitting on the side of the bed? : Unable Difficulty sitting down on and standing up from a chair with arms (e.g., wheelchair, bedside commode, etc,.)?: Unable Help needed moving to and from a bed to chair (including a wheelchair)?: A Little Help needed walking in hospital room?: A Little Help needed climbing 3-5 steps with a railing? : A Little 6 Click Score: 14    End of Session Equipment Utilized During Treatment: Back brace;Gait belt Activity Tolerance: Patient tolerated treatment well Patient left: in bed;with call bell/phone within reach;with bed alarm set;with family/visitor present Nurse Communication: Mobility status PT Visit Diagnosis: Other abnormalities of gait and mobility (R26.89);Pain Pain - part of body: (back)     Time: 9562-1308 PT Time Calculation (min) (ACUTE ONLY): 26 min  Charges:  $Gait Training: 8-22 mins $Therapeutic Activity: 8-22 mins                    G Codes:       [place], [place], [place] 06/07/2017, 11:03 AM

## 2017-06-07 NOTE — Progress Notes (Signed)
Occupational Therapy Treatment Patient Details Name: Regina Porter MRN: [id number] DOB: 13-Feb-1934 Today's Date: 06/07/2017    History of present illness Pt is an 83 y/o female s/p L3-L4 TLIF. PMH including but not limited to dementia, HTN, breast cancer and CKD.    OT comments  This 83 yo female seen today for second session to address LBADLs with AE use. Pt able to use AE with min A for LBADLs and this should get easier with increased use. Pt aware of where she can get AE.   Follow Up Recommendations  Home health OT;Supervision/Assistance - 24 hour    Equipment Recommendations  3 in 1 bedside commode       Precautions / Restrictions Precautions Precautions: Back Precaution Booklet Issued: Yes (comment) Precaution Comments: PT reviewed 3/3 back precautions with pt and pt's daughter Required Braces or Orthoses: Spinal Brace Spinal Brace: Lumbar corset;Applied in sitting position Restrictions Weight Bearing Restrictions: No       Mobility Bed Mobility Overal bed mobility: Needs Assistance Bed Mobility: Sidelying to Sit;Sit to Sidelying;Rolling Rolling: Min assist(as well as VCs to roll like a log) Sidelying to sit: Min assist(with HOB elevated once she was on her side and legs off of bed--pt getting a hospital bed so she will be able to do this at home until she can get more mobile and be able to get out of a regular flat bed)     Sit to sidelying: Min assist(for legs) General bed mobility comments: pt sitting on EOB upon my arrival with NT in room  Transfers Overall transfer level: Needs assistance Equipment used: Rolling walker (2 wheeled) Transfers: Sit to/from Stand Sit to Stand: Min guard         General transfer comment: increased time and effort, good technique,     Balance Overall balance assessment: Needs assistance Sitting-balance support: No upper extremity supported;Feet supported Sitting balance-Leahy Scale: Fair     Standing balance support: Single  extremity supported;During functional activity Standing balance-Leahy Scale: Poor                             ADL either performed or assessed with clinical judgement   ADL   General ADL Comments: Educated pt on use of AE for LB ADLs. Pt able to doff and don her socks while seated EOB with reacher and sock aid; as well as don underwear/pants with reacher simulated with pillow case. Educated pt on best sequence of dressing to support back more and be more energy efficient.     Vision Patient Visual Report: No change from baseline            Cognition Arousal/Alertness: Awake/alert Behavior During Therapy: WFL for tasks assessed/performed Overall Cognitive Status: Within Functional Limits for tasks assessed                                                     Pertinent Vitals/ Pain       Pain Assessment: Faces Faces Pain Scale: Hurts little more Pain Location: back with transition movements Pain Descriptors / Indicators: Sore;Grimacing;Guarding Pain Intervention(s): Monitored during session;Repositioned         Frequency  Min 2X/week        Progress Toward Goals  OT Goals(current goals can now be found in the  care plan section)  Progress towards OT goals: Progressing toward goals  Acute Rehab OT Goals Patient Stated Goal: decrease pain OT Goal Formulation: With patient/family Time For Goal Achievement: 06/14/17 Potential to Achieve Goals: Good ADL Goals Pt Will Perform Lower Body Dressing: with min guard assist;with adaptive equipment;sit to/from stand Additional ADL Goal #1: Pt will be min guard A in and OOB for basic ADLs  Plan Discharge plan remains appropriate       AM-PAC PT "6 Clicks" Daily Activity     Outcome Measure   Help from another person eating meals?: None Help from another person taking care of personal grooming?: A Little Help from another person toileting, which includes using toliet, bedpan, or urinal?: A  Lot Help from another person bathing (including washing, rinsing, drying)?: A Lot Help from another person to put on and taking off regular upper body clothing?: A Lot Help from another person to put on and taking off regular lower body clothing?: A Little(with AE) 6 Click Score: 16    End of Session Equipment Utilized During Treatment: Rolling walker;Back brace  OT Visit Diagnosis: Unsteadiness on feet (R26.81);Muscle weakness (generalized) (M62.81);Pain Pain - part of body: (back)   Activity Tolerance Patient tolerated treatment well   Patient Left in bed;with call bell/phone within reach   Nurse Communication (purewick container needs empyting and pt needs purewick replaced)        Time: 1312-1350 OT Time Calculation (min): 38 min  Charges: OT General Charges $OT Visit: 1 Visit OT Evaluation $OT Eval Moderate Complexity: 1 Mod OT Treatments $Self Care/Home Management : 38-52 mins  Golden Circle, OTR/L [phone] 06/07/2017

## 2017-06-07 NOTE — Discharge Summary (Signed)
Patient ID: LAMIS BEHRMANN MRN: [id number] DOB/AGE: 1934-03-26 83 y.o.  Admit date: 06/04/2017 Discharge date: 06/07/2017  Admission Diagnoses:  Active Problems:   Hypothyroidism   Hypertension   Heartburn   Depression   Dementia   Chronic midline low back pain without sciatica   CKD (chronic kidney disease) stage 3, GFR 30-59 ml/min (HCC)   Spinal stenosis of lumbar region with neurogenic claudication   Lumbar stenosis   Discharge Diagnoses:  Same  Past Medical History:  Diagnosis Date  . Anemia   . Anterolisthesis    grade 1: L3-4  . Anxiety   . Arthritis   . Breast cancer ([place])   . Chronic kidney disease   . Dementia   . Depression   . Dyspnea    W/ PAIN   . Gastroesophageal reflux disease   . Headache    TO SEE NEURO MD  . Heartburn   . Hypertension   . Hypothyroidism   . Leg cramps   . Post-menopause bleeding   . Restless leg syndrome 10/07/2014    Surgeries: Procedure(s): L3-4 TRANSFORAMINAL LUMBAR INTERBODY FUSION, CAGE, PEDICLE INSTRUMENTATION, GILL PROCEDURE on 06/04/2017   Consultants: Treatment Team:  Fanny Dance, MD  Discharged Condition: Improved  Hospital Course: Regina Porter is an 83 y.o. female who was admitted 06/04/2017 for operative treatment of<principal problem not specified>. Patient has severe unremitting pain that affects sleep, daily activities, and work/hobbies. After pre-op clearance the patient was taken to the operating room on 06/04/2017 and underwent  Procedure(s): L3-4 TRANSFORAMINAL LUMBAR INTERBODY FUSION, CAGE, PEDICLE INSTRUMENTATION, GILL PROCEDURE.    Patient was given perioperative antibiotics:  Anti-infectives (From admission, onward)   Start     Dose/Rate Route Frequency Ordered Stop   06/04/17 2130  ceFAZolin (ANCEF) IVPB 2g/100 mL premix     2 g 200 mL/hr over 30 Minutes Intravenous Every 8 hours 06/04/17 2004 06/05/17 0630   06/04/17 1015  ceFAZolin (ANCEF) IVPB 2g/100 mL premix     2 g 200 mL/hr over 30 Minutes  Intravenous On call to O.R. 06/04/17 1012 06/04/17 1329       Patient was given sequential compression devices, early ambulation, and chemoprophylaxis to prevent DVT.  Patient benefited maximally from hospital stay and there were no complications.    Recent vital signs:  Patient Vitals for the past 24 hrs:  BP Temp Temp src Pulse Resp SpO2  06/07/17 0504 (!) 160/35 - - - - -  06/07/17 0500 (!) 160/35 99.1 F (37.3 C) Oral 72 - 99 %  06/06/17 2030 (!) 155/47 98.8 F (37.1 C) Oral 79 - 100 %  06/06/17 1400 (!) 161/56 - - - - -  06/06/17 1203 (!) 161/56 - - 72 (!) 22 -  06/06/17 1200 (!) 161/38 98.7 F (37.1 C) Oral 73 (!) 22 99 %     Recent laboratory studies:  Recent Labs    06/05/17 0357 06/06/17 0456  WBC 10.1  --   HGB 9.3*  --   HCT 28.2*  --   PLT 223  --   NA 141 139  K 5.4* 4.5  CL 108 107  CO2 26 23  BUN 27* 19  CREATININE 2.07* 1.67*  GLUCOSE 149* 129*  CALCIUM 8.8* 8.7*     Discharge Medications:   Allergies as of 06/07/2017      Reactions   Shellfish Allergy Swelling, Rash   MOUTH   Neomy-bacit-polymyx-pramoxine    UNSPECIFIED REACTION  Not sure which antibiotic she  has a reaction to      Medication List    STOP taking these medications   losartan 100 MG tablet Commonly known as:  COZAAR   losartan 50 MG tablet Commonly known as:  COZAAR   traMADol 50 MG tablet Commonly known as:  ULTRAM   TYLENOL 325 MG tablet Generic drug:  acetaminophen     TAKE these medications   amLODipine 10 MG tablet Commonly known as:  NORVASC TAKE 1 TABLET BY MOUTH EVERY DAY   aspirin EC 81 MG tablet Take 81 mg by mouth daily.   bumetanide 2 MG tablet Commonly known as:  BUMEX Take 2 mg by mouth daily.   busPIRone 7.5 MG tablet Commonly known as:  BUSPAR TAKE 1 TABLET BY MOUTH TWICE A DAY   Cod Liver Oil 1000 MG Caps Take 1 capsule by mouth 2 (two) times daily.   [employer] VITAMIN A 37106 UNIT capsule Generic drug:  vitamin A Take 10,000 Units by  mouth daily.   donepezil 5 MG tablet Commonly known as:  ARICEPT TAKE 1 TABLET (5 MG TOTAL) BY MOUTH AT BEDTIME. What changed:  when to take this   famotidine 20 MG tablet Commonly known as:  PEPCID Take 20 mg by mouth 2 (two) times daily.   famotidine 20 MG tablet Commonly known as:  PEPCID TAKE 1 TABLET (20 MG TOTAL) BY MOUTH 2 (TWO) TIMES DAILY. FOR ACID REFLUX   hydrALAZINE 10 MG tablet Commonly known as:  APRESOLINE TAKE 1 TABLET BY MOUTH THREE TIMES A DAY What changed:  Another medication with the same name was added. Make sure you understand how and when to take each.   hydrALAZINE 25 MG tablet Commonly known as:  APRESOLINE Take 1 tablet (25 mg total) by mouth every 8 (eight) hours. What changed:  You were already taking a medication with the same name, and this prescription was added. Make sure you understand how and when to take each.   HYDROcodone-acetaminophen 5-325 MG tablet Commonly known as:  NORCO/VICODIN Take 1-2 tablets by mouth every 6 (six) hours as needed for moderate pain.   levothyroxine 100 MCG tablet Commonly known as:  SYNTHROID, LEVOTHROID TAKE 1 TABLET (100 MCG TOTAL) BY MOUTH DAILY BEFORE BREAKFAST.   metoprolol succinate 50 MG 24 hr tablet Commonly known as:  TOPROL-XL TAKE 1 TABLET BY MOUTH DAILY WITH OR IMMEDIATELY FOLLOWING A MEAL   metoprolol succinate 100 MG 24 hr tablet Commonly known as:  TOPROL-XL Take 100 mg by mouth at bedtime.   ranitidine 150 MG tablet Commonly known as:  ZANTAC Take 150 mg by mouth 2 (two) times daily as needed for heartburn.   rOPINIRole 1 MG tablet Commonly known as:  REQUIP TAKE 1 TABLET (1 MG TOTAL) BY MOUTH AT BEDTIME.   tamoxifen 20 MG tablet Commonly known as:  NOLVADEX Take 20 mg by mouth daily.   traZODone 50 MG tablet Commonly known as:  DESYREL TAKE 1 TABLET (50 MG TOTAL) BY MOUTH AT BEDTIME.   VISINE OP Place 1 drop into both eyes daily as needed (dry eyes).   vitamin C 100 MG  tablet Take 100 mg by mouth daily.            Durable Medical Equipment  (From admission, onward)        Start     Ordered   06/07/17 0813  For home use only DME Hospital bed  Once    Question Answer Comment  Patient has (list  medical condition): s/p L3 - L4 decompression   The above medical condition requires: Patient requires the ability to reposition frequently   Head must be elevated greater than: 30 degrees   Bed type Semi-electric      06/07/17 0813   06/07/17 0759  For home use only DME 3 n 1  Once     06/07/17 0759   06/06/17 1045  For home use only DME 3 n 1  Once     06/06/17 1044      Diagnostic Studies: Dg Chest 2 View  Result Date: 05/31/2017 CLINICAL DATA:  Preop testing. Pt is scheduled to have surgery on her L-spine on Wednesday 06/04/17. Hx; breast cancer, HTN, heartburn, breast surgery-lumpectomy (left), former smoker of .1 packs/day for 20 years, pt states she quit 6 or 7 years ago (2012 or 2013) EXAM: CHEST - 2 VIEW COMPARISON:  Chest CT 05/01/2015 FINDINGS: Normal mediastinum and cardiac silhouette. Normal pulmonary vasculature. No evidence of effusion, infiltrate, or pneumothorax. No acute bony abnormality. Surgical clips in the LEFT breast. IMPRESSION: No active cardiopulmonary disease. Electronically Signed   By: Suzy Bouchard M.D.   On: 05/31/2017 17:37   Dg Lumbar Spine 2-3 Views  Result Date: 06/04/2017 CLINICAL DATA:  Lumbar fusion EXAM: LUMBAR SPINE - 2-3 VIEW; DG C-ARM 61-120 MIN COMPARISON:  06/25/2016 FLUOROSCOPY TIME:  Fluoroscopy Time:  24 seconds Radiation Exposure Index (if provided by the fluoroscopic device): Not available Number of Acquired Spot Images: 2 FINDINGS: Pedicle screws are noted at L3 and L4 with interbody fusion and posterior fixation. The numbering nomenclature is similar to that utilized on prior MRI. IMPRESSION: Lumbar fusion. Electronically Signed   By: Inez Catalina M.D.   On: 06/04/2017 16:25   Dg C-arm 1-60 Min  Result  Date: 06/04/2017 CLINICAL DATA:  Lumbar fusion EXAM: LUMBAR SPINE - 2-3 VIEW; DG C-ARM 61-120 MIN COMPARISON:  06/25/2016 FLUOROSCOPY TIME:  Fluoroscopy Time:  24 seconds Radiation Exposure Index (if provided by the fluoroscopic device): Not available Number of Acquired Spot Images: 2 FINDINGS: Pedicle screws are noted at L3 and L4 with interbody fusion and posterior fixation. The numbering nomenclature is similar to that utilized on prior MRI. IMPRESSION: Lumbar fusion. Electronically Signed   By: Inez Catalina M.D.   On: 06/04/2017 16:25   Dg C-arm 1-60 Min  Result Date: 06/04/2017 CLINICAL DATA:  Lumbar fusion EXAM: LUMBAR SPINE - 2-3 VIEW; DG C-ARM 61-120 MIN COMPARISON:  06/25/2016 FLUOROSCOPY TIME:  Fluoroscopy Time:  24 seconds Radiation Exposure Index (if provided by the fluoroscopic device): Not available Number of Acquired Spot Images: 2 FINDINGS: Pedicle screws are noted at L3 and L4 with interbody fusion and posterior fixation. The numbering nomenclature is similar to that utilized on prior MRI. IMPRESSION: Lumbar fusion. Electronically Signed   By: Inez Catalina M.D.   On: 06/04/2017 16:25    Disposition: Discharge disposition: 01-Home or Self Care       Discharge Instructions    Incentive spirometry RT   Complete by:  As directed       Follow-up Information    Marybelle Killings, MD Follow up.   Specialty:  Orthopedic Surgery Contact information: [place] [address] [phone]        [facility] Follow up.   Specialty:  [place] Why:  A representative from [facility] will contact you to arrange start date and time for theray. Contact information: [address]  [place] [address] [place] .   Contact information: [address] [place] [address] [phone]            Signed: Erskine Emery 06/07/2017, 8:41 AM

## 2017-06-07 NOTE — Evaluation (Signed)
Occupational Therapy Evaluation Patient Details Name: Regina Porter MRN: [id number] DOB: Jan 09, 1935 Today's Date: 06/07/2017    History of Present Illness Pt is an 83 y/o female s/p L3-L4 TLIF. PMH including but not limited to dementia, HTN, breast cancer and CKD.    Clinical Impression   This 83 yo female admitted and underwent above presents to acute OT with decreased balance, decreased mobility, increased pain (especially with transition movements), and back precautions all affecting her safety and independence with basic ADLs. She will benefit from one more session of OT to address AE use for LBADLs.     Follow Up Recommendations  Home health OT;Supervision/Assistance - 24 hour    Equipment Recommendations  3 in 1 bedside commode       Precautions / Restrictions Precautions Precautions: Back Precaution Booklet Issued: Yes (comment) Precaution Comments: PT reviewed 3/3 back precautions with pt and pt's daughter Required Braces or Orthoses: Spinal Brace Spinal Brace: Lumbar corset;Applied in sitting position Restrictions Weight Bearing Restrictions: No      Mobility Bed Mobility General bed mobility comments: pt sitting on EOB upon my arrival with NT in room  Transfers Overall transfer level: Needs assistance Equipment used: Rolling walker (2 wheeled) Transfers: Sit to/from Stand Sit to Stand: Min guard         General transfer comment: increased time and effort, good technique, min guard A to rise from bed and 3n1     Balance Overall balance assessment: Needs assistance Sitting-balance support: Feet supported;No upper extremity supported Sitting balance-Leahy Scale: Fair     Standing balance support: Single extremity supported;During functional activity Standing balance-Leahy Scale: Poor                             ADL either performed or assessed with clinical judgement   ADL Overall ADL's : Needs assistance/impaired Eating/Feeding:  Independent;Sitting   Grooming: Supervision/safety;Set up;Sitting   Upper Body Bathing: Supervision/ safety;Set up;Sitting   Lower Body Bathing: Moderate assistance Lower Body Bathing Details (indicate cue type and reason): min guard A sit<>stand Upper Body Dressing : Moderate assistance;Sitting Upper Body Dressing Details (indicate cue type and reason): including brace Lower Body Dressing: Maximal assistance Lower Body Dressing Details (indicate cue type and reason): without AE, min guard A sit<>stand Toilet Transfer: Min guard;Ambulation;BSC Toilet Transfer Details (indicate cue type and reason): over toilet Toileting- Clothing Manipulation and Hygiene: Moderate assistance Toileting - Clothing Manipulation Details (indicate cue type and reason): minguard A sit<>stand   Tub/Shower Transfer Details (indicate cue type and reason): Educated dter on tub transfer with tub bench and how she will need to steady RW for her mom to use it for sit<>stand from tub bench         Vision Patient Visual Report: No change from baseline              Pertinent Vitals/Pain Pain Assessment: Faces Faces Pain Scale: Hurts little more Pain Location: back Pain Descriptors / Indicators: Sore Pain Intervention(s): Monitored during session;Repositioned     Hand Dominance Right   Extremity/Trunk Assessment Upper Extremity Assessment Upper Extremity Assessment: Generalized weakness           Communication Communication Communication: No difficulties   Cognition Arousal/Alertness: Awake/alert Behavior During Therapy: WFL for tasks assessed/performed Overall Cognitive Status: Within Functional Limits for tasks assessed  Home Living Family/patient expects to be discharged to:: Private residence Living Arrangements: Children Available Help at Discharge: Family;Available 24 hours/day Type of Home: House Home Access: Stairs to  enter [employer] of Steps: 5 Entrance Stairs-Rails: Right;Left Home Layout: Two level Alternate Level Stairs-Number of Steps: flight Alternate Level Stairs-Rails: Right;Left Bathroom Shower/Tub: Tub/shower unit;Curtain   Bathroom Toilet: Standard     Home Equipment: Cane - single point;Walker - 2 wheels;Tub bench          Prior Functioning/Environment Level of Independence: Independent with assistive device(s)        Comments: ambulates with SPC PRN        OT Problem List: Decreased strength;Decreased range of motion;Impaired balance (sitting and/or standing);Pain;Decreased knowledge of precautions;Obesity;Decreased knowledge of use of DME or AE      OT Treatment/Interventions: Self-care/ADL training;Balance training;Therapeutic activities;DME and/or AE instruction;Patient/family education    OT Goals(Current goals can be found in the care plan section) Acute Rehab OT Goals Patient Stated Goal: decrease pain OT Goal Formulation: With patient/family Time For Goal Achievement: 06/14/17 Potential to Achieve Goals: Good  OT Frequency: Min 2X/week              AM-PAC PT "6 Clicks" Daily Activity     Outcome Measure Help from another person eating meals?: None Help from another person taking care of personal grooming?: A Little Help from another person toileting, which includes using toliet, bedpan, or urinal?: A Lot Help from another person bathing (including washing, rinsing, drying)?: A Lot Help from another person to put on and taking off regular upper body clothing?: A Lot Help from another person to put on and taking off regular lower body clothing?: Total 6 Click Score: 14   End of Session Equipment Utilized During Treatment: Gait belt;Rolling walker;Back brace Nurse Communication: Mobility status(NT)  Activity Tolerance: Patient tolerated treatment well Patient left: in chair;with call bell/phone within reach;with family/visitor present  OT  Visit Diagnosis: Unsteadiness on feet (R26.81);Muscle weakness (generalized) (M62.81);Pain Pain - part of body: (back)                Time: 0321-2248 OT Time Calculation (min): 61 min Charges:  OT General Charges $OT Visit: 1 Visit OT Evaluation $OT Eval Moderate Complexity: 1 Mod OT Treatments $Self Care/Home Management : 38-52 mins Golden Circle, OTR/L [phone] 06/07/2017

## 2017-06-07 NOTE — Progress Notes (Signed)
Pt's family spoke with [facility] and pt's bed is not going to be delivered until tomorrow and pt is concerned about going home without having hospital bed there. Would like to wait for bed to be delivered. On call MD Dr. Ninfa Linden informed and stated if she wanted to stay another night until bed cam be delivered that's fine. Family informed. Pt relieved.

## 2017-06-08 DIAGNOSIS — M48061 Spinal stenosis, lumbar region without neurogenic claudication: Secondary | ICD-10-CM

## 2017-06-08 NOTE — Progress Notes (Signed)
Physical Therapy Treatment Patient Details Name: Regina Porter MRN: [id number] DOB: 1934-03-11 Today's Date: 06/08/2017    History of Present Illness Pt is an 83 y/o female s/p L3-L4 TLIF. PMH including but not limited to dementia, HTN, breast cancer and CKD.     PT Comments    Patient is progressing very well towards their physical therapy goals. Verbalized and maintained precautions throughout session. Reports much improved pain control compared to yesterday. Session focused on ambulation in room using RW and min guard assist as well as instructing and demonstrating for patient/patient daughter on proper technique for car transfer.     Follow Up Recommendations  Home health PT;Supervision/Assistance - 24 hour     Equipment Recommendations  Hospital bed;3in1 (PT)    Recommendations for Other Services       Precautions / Restrictions Precautions Precautions: Back Precaution Comments: Pt able to recall 3/3 back precautions Required Braces or Orthoses: Spinal Brace Spinal Brace: Lumbar corset;Applied in sitting position Restrictions Weight Bearing Restrictions: No    Mobility  Bed Mobility Overal bed mobility: Needs Assistance Bed Mobility: Rolling;Sidelying to Sit Rolling: Min guard Sidelying to sit: Min guard       General bed mobility comments: Patient able to perform log roll technique independently and elevated trunk with assist of HOB elevation (will have hospital bed at home)  Transfers Overall transfer level: Needs assistance Equipment used: Rolling walker (2 wheeled) Transfers: Sit to/from Stand Sit to Stand: Min guard         General transfer comment: increased time and effort  Ambulation/Gait Ambulation/Gait assistance: Min guard   Assistive device: Rolling walker (2 wheeled) Gait Pattern/deviations: Step-through pattern;Decreased stride length     General Gait Details: pt steady with RW, min guard, pt very cautious, guarded and slow   Stairs              Wheelchair Mobility    Modified Rankin (Stroke Patients Only)       Balance Overall balance assessment: Needs assistance Sitting-balance support: No upper extremity supported;Feet supported Sitting balance-Leahy Scale: Fair     Standing balance support: Single extremity supported;During functional activity Standing balance-Leahy Scale: Poor                              Cognition Arousal/Alertness: Awake/alert Behavior During Therapy: WFL for tasks assessed/performed Overall Cognitive Status: Within Functional Limits for tasks assessed                                 General Comments: Baseline mild dementia. Following all cues       Exercises      General Comments General comments (skin integrity, edema, etc.): daughter present during session      Pertinent Vitals/Pain Pain Assessment: Faces Faces Pain Scale: Hurts little more Pain Location: back with transition movements Pain Descriptors / Indicators: Sore;Grimacing;Guarding Pain Intervention(s): Monitored during session    Home Living Family/patient expects to be discharged to:: Private residence Living Arrangements: Children Available Help at Discharge: Family;Available 24 hours/day Type of Home: House Home Access: Stairs to enter Entrance Stairs-Rails: Right;Left Home Layout: Two level Home Equipment: Cane - single point;Walker - 2 wheels;Tub bench      Prior Function Level of Independence: Independent with assistive device(s)      Comments: ambulates with SPC PRN   PT Goals (current goals can now be found in  the care plan section) Acute Rehab PT Goals Patient Stated Goal: decrease pain PT Goal Formulation: With patient/family Time For Goal Achievement: 06/19/17 Potential to Achieve Goals: Good Progress towards PT goals: Progressing toward goals    Frequency    Min 5X/week      PT Plan Current plan remains appropriate    Co-evaluation               AM-PAC PT "6 Clicks" Daily Activity  Outcome Measure  Difficulty turning over in bed (including adjusting bedclothes, sheets and blankets)?: A Little Difficulty moving from lying on back to sitting on the side of the bed? : A Lot Difficulty sitting down on and standing up from a chair with arms (e.g., wheelchair, bedside commode, etc,.)?: A Little Help needed moving to and from a bed to chair (including a wheelchair)?: A Little Help needed walking in hospital room?: A Little Help needed climbing 3-5 steps with a railing? : A Little 6 Click Score: 17    End of Session Equipment Utilized During Treatment: Back brace;Gait belt Activity Tolerance: Patient tolerated treatment well Patient left: in chair;with call bell/phone within reach;with family/visitor present Nurse Communication: Mobility status PT Visit Diagnosis: Other abnormalities of gait and mobility (R26.89);Pain     Time: 3074-6002 PT Time Calculation (min) (ACUTE ONLY): 19 min  Charges:  $Therapeutic Activity: 8-22 mins                    G Codes:       Ellamae Sia, PT, DPT Acute Rehabilitation Services  Pager: [phone]    Willy Eddy 06/08/2017, 3:44 PM

## 2017-06-08 NOTE — Progress Notes (Signed)
Physical Therapy Treatment Patient Details Name: Regina Porter MRN: [id number] DOB: 07-11-1934 Today's Date: 06/08/2017    History of Present Illness Pt is an 83 y/o female s/p L3-L4 TLIF. PMH including but not limited to dementia, HTN, breast cancer and CKD.     PT Comments    Session focused on performing assisting patient safely perform car transfer in order to discharge home. Transfer was unusual due to patient's daughter's high car seat, requiring additional assistance from PT. Patient safely transferred backwards onto step stool, then onto running board in order to sit. Good maintenance of precautions throughout. Patient will benefit from continued HHPT to address deficits and maximize functional independence.     Follow Up Recommendations  Home health PT;Supervision/Assistance - 24 hour     Equipment Recommendations  Hospital bed;3in1 (PT)    Recommendations for Other Services       Precautions / Restrictions Precautions Precautions: Back Precaution Comments: Pt able to recall 3/3 back precautions Required Braces or Orthoses: Spinal Brace Spinal Brace: Lumbar corset;Applied in sitting position Restrictions Weight Bearing Restrictions: No    Mobility  Bed Mobility Overal bed mobility: Needs Assistance Bed Mobility: Rolling;Sidelying to Sit Rolling: Min guard Sidelying to sit: Min guard       General bed mobility comments: Patient able to perform log roll technique independently and elevated trunk with assist of HOB elevation (will have hospital bed at home)  Transfers Overall transfer level: Needs assistance Equipment used: Rolling walker (2 wheeled) Transfers: Sit to/from Stand(Car transfer) Sit to Stand: Min guard         General transfer comment: Min guard provided for x2 sit to stands from transport wheelchair. Min guard also provided for performance of car transfer. Utilizing RW, patient stepped back onto step stool, and then onto running board in order to  sit down into the car. Good maintenance of precautions throughout.  Ambulation/Gait Ambulation/Gait assistance: Min guard   Assistive device: Rolling walker (2 wheeled) Gait Pattern/deviations: Step-through pattern;Decreased stride length Gait velocity: decreased   General Gait Details: pt steady with RW, min guard   Stairs             Wheelchair Mobility    Modified Rankin (Stroke Patients Only)       Balance Overall balance assessment: Needs assistance Sitting-balance support: No upper extremity supported;Feet supported Sitting balance-Leahy Scale: Fair     Standing balance support: During functional activity;Single extremity supported Standing balance-Leahy Scale: Fair                              Cognition Arousal/Alertness: Awake/alert Behavior During Therapy: WFL for tasks assessed/performed Overall Cognitive Status: Within Functional Limits for tasks assessed                                 General Comments: Baseline mild dementia. Following all cues       Exercises      General Comments General comments (skin integrity, edema, etc.): daughter present during session      Pertinent Vitals/Pain Pain Assessment: Faces Faces Pain Scale: Hurts a little bit Pain Location: back with transition movements Pain Descriptors / Indicators: Sore;Grimacing;Guarding Pain Intervention(s): Monitored during session    Home Living Family/patient expects to be discharged to:: Private residence Living Arrangements: Children Available Help at Discharge: Family;Available 24 hours/day Type of Home: House Home Access: Stairs to enter Entrance  Stairs-Rails: Right;Left Home Layout: Two level Home Equipment: Cane - single point;Walker - 2 wheels;Tub bench      Prior Function Level of Independence: Independent with assistive device(s)      Comments: ambulates with SPC PRN   PT Goals (current goals can now be found in the care plan  section) Acute Rehab PT Goals Patient Stated Goal: decrease pain PT Goal Formulation: With patient/family Time For Goal Achievement: 06/19/17 Potential to Achieve Goals: Good Progress towards PT goals: Progressing toward goals    Frequency    Min 5X/week      PT Plan Current plan remains appropriate    Co-evaluation              AM-PAC PT "6 Clicks" Daily Activity  Outcome Measure  Difficulty turning over in bed (including adjusting bedclothes, sheets and blankets)?: A Little Difficulty moving from lying on back to sitting on the side of the bed? : A Lot Difficulty sitting down on and standing up from a chair with arms (e.g., wheelchair, bedside commode, etc,.)?: A Little Help needed moving to and from a bed to chair (including a wheelchair)?: A Little Help needed walking in hospital room?: A Little Help needed climbing 3-5 steps with a railing? : A Little 6 Click Score: 17    End of Session Equipment Utilized During Treatment: Back brace;Gait belt Activity Tolerance: Patient tolerated treatment well Patient left: Other (comment) Nurse Communication: Mobility status PT Visit Diagnosis: Other abnormalities of gait and mobility (R26.89);Pain     Time: 3212-2482 PT Time Calculation (min) (ACUTE ONLY): 16 min  Charges:  $Therapeutic Activity: 8-22 mins                    G Codes:       Ellamae Sia, PT, DPT Acute Rehabilitation Services  Pager: [phone]    Willy Eddy 06/08/2017, 3:50 PM

## 2017-06-08 NOTE — Progress Notes (Signed)
PROGRESS NOTE        PATIENT DETAILS Name: Regina Porter Age: 83 y.o. Sex: female Date of Birth: [birth year] Admit Date: 06/04/2017 Admitting Physician Marybelle Killings, MD WVP:[id number], Carlos American, NP  Brief Narrative:  Patient is a 83 y.o. female who was admitted by the orthopedic service due to L 3-4 instability with severe central and foraminal stenosis along with neurogenic claudication-she underwent L3-4 decompression, the hospitalist service was subsequently consulted to manage her medical comorbidities.  Subjective:  Patient in bed, appears comfortable, denies any headache, no fever, no chest pain or pressure, no shortness of breath , no abdominal pain. No focal weakness.   Assessment/Plan:  Lumbar spinal stenosis: Status post L3-L4 decompression on 5/22-defer care to primary service.  Mild hyperkalemia: Resolved-would continue to hold losartan on discharge.  Discharge medication list confirmed.  Follow with PCP in a week get repeat BMP and blood pressure checked.  Chronic kidney disease stage III-IV: Close to usual baseline-follow with PCP.   Hypertension: Still on the higher side-continue amlodipine, metoprolol, Bumex-I have added hydralazine-okay to continue all these agents on discharge and follow-up with PCP for further optimization   Chronic diastolic heart failure: Euvolemic-continue Bumex on discharge  Hypothyroidism: Continue levothyroxine  GERD: Continue PPI  History of breast cancer: Continue tamoxifen  Okay to discharge from our point of view-if patient is still here-we will follow.  Allergies as of 06/08/2017      Reactions   Shellfish Allergy Swelling, Rash   MOUTH   Neomy-bacit-polymyx-pramoxine    UNSPECIFIED REACTION  Not sure which antibiotic she has a reaction to      Medication List    STOP taking these medications   losartan 100 MG tablet Commonly known as:  COZAAR   losartan 50 MG tablet Commonly known as:  COZAAR     traMADol 50 MG tablet Commonly known as:  ULTRAM   TYLENOL 325 MG tablet Generic drug:  acetaminophen     TAKE these medications   amLODipine 10 MG tablet Commonly known as:  NORVASC TAKE 1 TABLET BY MOUTH EVERY DAY   aspirin EC 81 MG tablet Take 81 mg by mouth daily.   bumetanide 2 MG tablet Commonly known as:  BUMEX Take 2 mg by mouth daily.   busPIRone 7.5 MG tablet Commonly known as:  BUSPAR TAKE 1 TABLET BY MOUTH TWICE A DAY   Cod Liver Oil 1000 MG Caps Take 1 capsule by mouth 2 (two) times daily.   [employer] VITAMIN A 48546 UNIT capsule Generic drug:  vitamin A Take 10,000 Units by mouth daily.   donepezil 5 MG tablet Commonly known as:  ARICEPT TAKE 1 TABLET (5 MG TOTAL) BY MOUTH AT BEDTIME. What changed:  when to take this   famotidine 20 MG tablet Commonly known as:  PEPCID Take 20 mg by mouth 2 (two) times daily.   famotidine 20 MG tablet Commonly known as:  PEPCID TAKE 1 TABLET (20 MG TOTAL) BY MOUTH 2 (TWO) TIMES DAILY. FOR ACID REFLUX   hydrALAZINE 10 MG tablet Commonly known as:  APRESOLINE TAKE 1 TABLET BY MOUTH THREE TIMES A DAY What changed:  Another medication with the same name was added. Make sure you understand how and when to take each.   hydrALAZINE 25 MG tablet Commonly known as:  APRESOLINE Take 1 tablet (25 mg total)  by mouth every 8 (eight) hours. What changed:  You were already taking a medication with the same name, and this prescription was added. Make sure you understand how and when to take each.   HYDROcodone-acetaminophen 5-325 MG tablet Commonly known as:  NORCO/VICODIN Take 1-2 tablets by mouth every 6 (six) hours as needed for moderate pain.   levothyroxine 100 MCG tablet Commonly known as:  SYNTHROID, LEVOTHROID TAKE 1 TABLET (100 MCG TOTAL) BY MOUTH DAILY BEFORE BREAKFAST.   metoprolol succinate 50 MG 24 hr tablet Commonly known as:  TOPROL-XL TAKE 1 TABLET BY MOUTH DAILY WITH OR IMMEDIATELY FOLLOWING A MEAL    metoprolol succinate 100 MG 24 hr tablet Commonly known as:  TOPROL-XL Take 100 mg by mouth at bedtime.   ranitidine 150 MG tablet Commonly known as:  ZANTAC Take 150 mg by mouth 2 (two) times daily as needed for heartburn.   rOPINIRole 1 MG tablet Commonly known as:  REQUIP TAKE 1 TABLET (1 MG TOTAL) BY MOUTH AT BEDTIME.   tamoxifen 20 MG tablet Commonly known as:  NOLVADEX Take 20 mg by mouth daily.   traZODone 50 MG tablet Commonly known as:  DESYREL TAKE 1 TABLET (50 MG TOTAL) BY MOUTH AT BEDTIME.   VISINE OP Place 1 drop into both eyes daily as needed (dry eyes).   vitamin C 100 MG tablet Take 100 mg by mouth daily.            Durable Medical Equipment  (From admission, onward)        Start     Ordered   06/07/17 0813  For home use only DME Hospital bed  Once    Question Answer Comment  Patient has (list medical condition): s/p L3 - L4 decompression   The above medical condition requires: Patient requires the ability to reposition frequently   Head must be elevated greater than: 30 degrees   Bed type Semi-electric      06/07/17 0813   06/07/17 0759  For home use only DME 3 n 1  Once     06/07/17 0759   06/06/17 1045  For home use only DME 3 n 1  Once     06/06/17 1044      DVT Prophylaxis: Defer to primary service  Code Status: Full code  Disposition Plan: Defer to primary service  Antimicrobial agents: Anti-infectives (From admission, onward)   Start     Dose/Rate Route Frequency Ordered Stop   06/04/17 2130  ceFAZolin (ANCEF) IVPB 2g/100 mL premix     2 g 200 mL/hr over 30 Minutes Intravenous Every 8 hours 06/04/17 2004 06/05/17 0630   06/04/17 1015  ceFAZolin (ANCEF) IVPB 2g/100 mL premix     2 g 200 mL/hr over 30 Minutes Intravenous On call to O.R. 06/04/17 1012 06/04/17 1329      Procedures: 5/22>> L3-4 decompression  Time spent: 25 minutes-Greater than 50% of this time was spent in counseling, explanation of diagnosis, planning  of further management, and coordination of care.  MEDICATIONS: Scheduled Meds: . amLODipine  10 mg Oral Daily  . bumetanide  2 mg Oral Daily  . busPIRone  7.5 mg Oral BID  . docusate sodium  100 mg Oral BID  . donepezil  5 mg Oral Daily  . famotidine  20 mg Oral QHS  . hydrALAZINE  50 mg Oral Q8H  . levothyroxine  100 mcg Oral QAC breakfast  . metoprolol succinate  100 mg Oral QHS  . rOPINIRole  1 mg Oral QHS  . sodium chloride flush  3 mL Intravenous Q12H  . tamoxifen  20 mg Oral Daily  . traZODone  50 mg Oral QHS   Continuous Infusions: . sodium chloride    . sodium chloride 85 mL/hr at 06/05/17 1130   PRN Meds:.acetaminophen **OR** acetaminophen, hydrALAZINE, HYDROcodone-acetaminophen, menthol-cetylpyridinium **OR** phenol, ondansetron **OR** ondansetron (ZOFRAN) IV, polyethylene glycol, sodium chloride flush, tetrahydrozoline   PHYSICAL EXAM: Vital signs: Vitals:   06/07/17 1543 06/07/17 1846 06/07/17 2008 06/08/17 0431  BP: (!) 150/52 (!) 152/45 (!) 163/37 (!) 173/56  Pulse:  86 72 79  Resp:   18 18  Temp:  98.4 F (36.9 C) 98.1 F (36.7 C) 98.6 F (37 C)  TempSrc:  Oral Oral Oral  SpO2:  100% 100% 100%  Weight:      Height:       Filed Weights   06/04/17 1042  Weight: 83.7 kg (184 lb 8 oz)   Body mass index is 30.7 kg/m.   Exam  Awake Alert, Oriented X 3, No new F.N deficits, Normal affect [place].AT,PERRAL Supple Neck,No JVD, No cervical lymphadenopathy appriciated.  Symmetrical Chest wall movement, Good air movement bilaterally, CTAB RRR,No Gallops, Rubs or new Murmurs, No Parasternal Heave +ve B.Sounds, Abd Soft, No tenderness, No organomegaly appriciated, No rebound - guarding or rigidity. No Cyanosis, Clubbing or edema, No new Rash or bruise  I have personally reviewed following labs and imaging studies  LABORATORY DATA: CBC: Recent Labs  Lab 06/05/17 0357  WBC 10.1  HGB 9.3*  HCT 28.2*  MCV 91.6  PLT 151    Basic Metabolic Panel: Recent  Labs  Lab 06/04/17 1701 06/05/17 0357 06/06/17 0456  NA 141 141 139  K 4.4 5.4* 4.5  CL 109 108 107  CO2 25 26 23   GLUCOSE 179* 149* 129*  BUN 31* 27* 19  CREATININE 2.30* 2.07* 1.67*  CALCIUM 8.7* 8.8* 8.7*    GFR: Estimated Creatinine Clearance: 28.2 mL/min (A) (by C-G formula based on SCr of 1.67 mg/dL (H)).  Liver Function Tests: No results for input(s): AST, ALT, ALKPHOS, BILITOT, PROT, ALBUMIN in the last 168 hours. No results for input(s): LIPASE, AMYLASE in the last 168 hours. No results for input(s): AMMONIA in the last 168 hours.  Coagulation Profile: No results for input(s): INR, PROTIME in the last 168 hours.  Cardiac Enzymes: No results for input(s): CKTOTAL, CKMB, CKMBINDEX, TROPONINI in the last 168 hours.  BNP (last 3 results) No results for input(s): PROBNP in the last 8760 hours.  HbA1C: No results for input(s): HGBA1C in the last 72 hours.  CBG: No results for input(s): GLUCAP in the last 168 hours.  Lipid Profile: No results for input(s): CHOL, HDL, LDLCALC, TRIG, CHOLHDL, LDLDIRECT in the last 72 hours.  Thyroid Function Tests: No results for input(s): TSH, T4TOTAL, FREET4, T3FREE, THYROIDAB in the last 72 hours.  Anemia Panel: No results for input(s): VITAMINB12, FOLATE, FERRITIN, TIBC, IRON, RETICCTPCT in the last 72 hours.  Urine analysis:    Component Value Date/Time   COLORURINE STRAW (A) 05/30/2017 1450   APPEARANCEUR CLEAR 05/30/2017 1450   LABSPEC 1.006 05/30/2017 1450   PHURINE 5.0 05/30/2017 1450   GLUCOSEU NEGATIVE 05/30/2017 1450   HGBUR NEGATIVE 05/30/2017 1450   BILIRUBINUR NEGATIVE 05/30/2017 1450   BILIRUBINUR negative 12/19/2014 1411   KETONESUR NEGATIVE 05/30/2017 1450   PROTEINUR NEGATIVE 05/30/2017 1450   UROBILINOGEN negative 12/19/2014 1411   NITRITE NEGATIVE 05/30/2017 1450   LEUKOCYTESUR NEGATIVE  05/30/2017 1450    Sepsis Labs: Lactic Acid, Venous No results found for: LATICACIDVEN  MICROBIOLOGY: Recent  Results (from the past 240 hour(s))  Surgical pcr screen     Status: None   Collection Time: 05/30/17  2:50 PM  Result Value Ref Range Status   MRSA, PCR NEGATIVE NEGATIVE Final   Staphylococcus aureus NEGATIVE NEGATIVE Final    Comment: (NOTE) The Xpert SA Assay (FDA approved for NASAL specimens in patients 8 years of age and older), is one component of a comprehensive surveillance program. It is not intended to diagnose infection nor to guide or monitor treatment. Performed at [facility], [place] [address] [place], [place] [address]     RADIOLOGY STUDIES/RESULTS: Dg Chest 2 View  Result Date: 05/31/2017 CLINICAL DATA:  Preop testing. Pt is scheduled to have surgery on her L-spine on Wednesday 06/04/17. Hx; breast cancer, HTN, heartburn, breast surgery-lumpectomy (left), former smoker of .1 packs/day for 20 years, pt states she quit 6 or 7 years ago (2012 or 2013) EXAM: CHEST - 2 VIEW COMPARISON:  Chest CT 05/01/2015 FINDINGS: Normal mediastinum and cardiac silhouette. Normal pulmonary vasculature. No evidence of effusion, infiltrate, or pneumothorax. No acute bony abnormality. Surgical clips in the LEFT breast. IMPRESSION: No active cardiopulmonary disease. Electronically Signed   By: Suzy Bouchard M.D.   On: 05/31/2017 17:37   Dg Lumbar Spine 2-3 Views  Result Date: 06/04/2017 CLINICAL DATA:  Lumbar fusion EXAM: LUMBAR SPINE - 2-3 VIEW; DG C-ARM 61-120 MIN COMPARISON:  06/25/2016 FLUOROSCOPY TIME:  Fluoroscopy Time:  24 seconds Radiation Exposure Index (if provided by the fluoroscopic device): Not available Number of Acquired Spot Images: 2 FINDINGS: Pedicle screws are noted at L3 and L4 with interbody fusion and posterior fixation. The numbering nomenclature is similar to that utilized on prior MRI. IMPRESSION: Lumbar fusion. Electronically Signed   By: Inez Catalina M.D.   On: 06/04/2017 16:25   Dg C-arm 1-60 Min  Result Date: 06/04/2017 CLINICAL DATA:  Lumbar fusion EXAM:  LUMBAR SPINE - 2-3 VIEW; DG C-ARM 61-120 MIN COMPARISON:  06/25/2016 FLUOROSCOPY TIME:  Fluoroscopy Time:  24 seconds Radiation Exposure Index (if provided by the fluoroscopic device): Not available Number of Acquired Spot Images: 2 FINDINGS: Pedicle screws are noted at L3 and L4 with interbody fusion and posterior fixation. The numbering nomenclature is similar to that utilized on prior MRI. IMPRESSION: Lumbar fusion. Electronically Signed   By: Inez Catalina M.D.   On: 06/04/2017 16:25   Dg C-arm 1-60 Min  Result Date: 06/04/2017 CLINICAL DATA:  Lumbar fusion EXAM: LUMBAR SPINE - 2-3 VIEW; DG C-ARM 61-120 MIN COMPARISON:  06/25/2016 FLUOROSCOPY TIME:  Fluoroscopy Time:  24 seconds Radiation Exposure Index (if provided by the fluoroscopic device): Not available Number of Acquired Spot Images: 2 FINDINGS: Pedicle screws are noted at L3 and L4 with interbody fusion and posterior fixation. The numbering nomenclature is similar to that utilized on prior MRI. IMPRESSION: Lumbar fusion. Electronically Signed   By: Inez Catalina M.D.   On: 06/04/2017 16:25     LOS: 4 days   Signature  Lala Lund M.D on 06/08/2017 at 10:50 AM  Between 7am to 7pm - Pager - [phone] ( page via [email or web address], text pages only, please mention full 10 digit call back number).  After 7pm go to [email or web address] - password [facility]

## 2017-06-08 NOTE — Progress Notes (Signed)
Pt given discharge instructions and prescriptions, gone over with her and daughters present. Answered all questions to satisfaction. All belongings gathered to be sent home. Daughters driving her home. Pt in no distress at time of discharge.

## 2017-06-08 NOTE — Discharge Summary (Signed)
  Patient remains stable and ready for discharge to home today . No Changes in discharge instructions.

## 2017-06-08 NOTE — Progress Notes (Signed)
Occupational Therapy Treatment Patient Details Name: Regina Porter MRN: [id number] DOB: 07-04-1934 Today's Date: 06/08/2017    History of present illness Pt is an 83 y/o female s/p L3-L4 TLIF. PMH including but not limited to dementia, HTN, breast cancer and CKD.    OT comments  Pt progressing towards established OT goals. Pt donning LB clothing with AE and Min Guard A for safety in standing; pt demonstrating good understanding of AE. Pt requiring Max A for donning brace, which her daughter demosntrating understanding for brace management. All acute OT needs met. Pt planning to dc today. Continue to recommend dc home with HHOT to increase safety and independence with ADLs and functional mobility.    Follow Up Recommendations  Home health OT;Supervision/Assistance - 24 hour    Equipment Recommendations  3 in 1 bedside commode    Recommendations for Other Services      Precautions / Restrictions Precautions Precautions: Back Precaution Comments: Pt able to recall 3/3 back precautions Required Braces or Orthoses: Spinal Brace Spinal Brace: Lumbar corset;Applied in sitting position Restrictions Weight Bearing Restrictions: No       Mobility Bed Mobility               General bed mobility comments: Pt in recliner upon arrival just finishing with PT  Transfers Overall transfer level: Needs assistance Equipment used: Rolling walker (2 wheeled) Transfers: Sit to/from Stand Sit to Stand: Min guard         General transfer comment: increased time and effort, good technique,     Balance Overall balance assessment: Needs assistance Sitting-balance support: No upper extremity supported;Feet supported Sitting balance-Leahy Scale: Fair     Standing balance support: Single extremity supported;During functional activity Standing balance-Leahy Scale: Poor                             ADL either performed or assessed with clinical judgement   ADL Overall ADL's :  Needs assistance/impaired                 Upper Body Dressing : Sitting;Set up;Supervision/safety;With caregiver independent assisting Upper Body Dressing Details (indicate cue type and reason): set up and supervision for donning dress. Max A for donning brace which daughter completed without need for any assistance.  Lower Body Dressing: Sit to/from stand;Min guard;With adaptive equipment Lower Body Dressing Details (indicate cue type and reason): Pt recalling education for LB dressing with AE. Pt donning depends with AE and Mi nGuard for standing balance. Donning/doffing shoes and socks with AE.                General ADL Comments: Reviewed all education on LB ADLs with AE     Vision Patient Visual Report: No change from baseline     Perception     Praxis      Cognition Arousal/Alertness: Awake/alert Behavior During Therapy: WFL for tasks assessed/performed Overall Cognitive Status: Within Functional Limits for tasks assessed                                 General Comments: Baseline mild dementia. Following all cues and recalling all education from prior session        Exercises     Shoulder Instructions       General Comments Daughters present throughout session. Discussed safe car transfer and daughter concerns about the height of the seat  in her SUV. Discussed different options for transferring into car.    Pertinent Vitals/ Pain       Pain Assessment: Faces Faces Pain Scale: Hurts little more Pain Location: back with transition movements Pain Descriptors / Indicators: Sore;Grimacing;Guarding Pain Intervention(s): Monitored during session;Limited activity within patient's tolerance;Repositioned  Home Living Family/patient expects to be discharged to:: Private residence Living Arrangements: Children Available Help at Discharge: Family;Available 24 hours/day Type of Home: House Home Access: Stairs to enter [employer] of Steps:  5 Entrance Stairs-Rails: Right;Left Home Layout: Two level Alternate Level Stairs-Number of Steps: flight Alternate Level Stairs-Rails: Right;Left Bathroom Shower/Tub: Tub/shower unit;Curtain   Bathroom Toilet: Standard     Home Equipment: Cane - single point;Walker - 2 wheels;Tub bench          Prior Functioning/Environment Level of Independence: Independent with assistive device(s)        Comments: ambulates with SPC PRN   Frequency  Min 2X/week        Progress Toward Goals  OT Goals(current goals can now be found in the care plan section)  Progress towards OT goals: Goals met/education completed, patient discharged from OT  Acute Rehab OT Goals Patient Stated Goal: decrease pain OT Goal Formulation: With patient/family Time For Goal Achievement: 06/14/17 Potential to Achieve Goals: Good ADL Goals Pt Will Perform Lower Body Dressing: with min guard assist;with adaptive equipment;sit to/from stand Additional ADL Goal #1: Pt will be min guard A in and OOB for basic ADLs  Plan Discharge plan remains appropriate    Co-evaluation                 AM-PAC PT "6 Clicks" Daily Activity     Outcome Measure   Help from another person eating meals?: None Help from another person taking care of personal grooming?: A Little Help from another person toileting, which includes using toliet, bedpan, or urinal?: A Lot Help from another person bathing (including washing, rinsing, drying)?: A Lot Help from another person to put on and taking off regular upper body clothing?: A Lot Help from another person to put on and taking off regular lower body clothing?: A Little(with AE) 6 Click Score: 16    End of Session Equipment Utilized During Treatment: Rolling walker;Back brace  OT Visit Diagnosis: Unsteadiness on feet (R26.81);Muscle weakness (generalized) (M62.81);Pain Pain - part of body: (back)   Activity Tolerance Patient tolerated treatment well   Patient Left  with call bell/phone within reach;in chair;with family/visitor present   Nurse Communication Mobility status;Precautions        Time: 6945-0388 OT Time Calculation (min): 22 min  Charges: OT General Charges $OT Visit: 1 Visit OT Treatments $Self Care/Home Management : 8-22 mins  [place], OTR/L Acute Rehab Pager: [phone] Office: [place] 06/08/2017, 1:21 PM

## 2017-06-08 NOTE — Progress Notes (Signed)
Patient ID: Regina Porter, female   DOB: April 24, 1934, 83 y.o.   MRN: [id number] Patient states she is feeling much better. Just waiting on hospital to arrive at home.   Lumbar incision dressing remains clean dry and intact. Sensation intact bilateral feet.   Plan: Discharge home later today

## 2017-06-08 NOTE — Plan of Care (Signed)
  Problem: Safety: Goal: Ability to remain free from injury will improve Outcome: Progressing   

## 2017-06-09 DIAGNOSIS — Z79810 Long term (current) use of selective estrogen receptor modulators (SERMs): Secondary | ICD-10-CM | POA: Diagnosis not present

## 2017-06-09 DIAGNOSIS — N183 Chronic kidney disease, stage 3 (moderate): Secondary | ICD-10-CM | POA: Diagnosis not present

## 2017-06-09 DIAGNOSIS — K219 Gastro-esophageal reflux disease without esophagitis: Secondary | ICD-10-CM | POA: Diagnosis not present

## 2017-06-09 DIAGNOSIS — C50919 Malignant neoplasm of unspecified site of unspecified female breast: Secondary | ICD-10-CM | POA: Diagnosis not present

## 2017-06-09 DIAGNOSIS — F0390 Unspecified dementia without behavioral disturbance: Secondary | ICD-10-CM | POA: Diagnosis not present

## 2017-06-09 DIAGNOSIS — D631 Anemia in chronic kidney disease: Secondary | ICD-10-CM | POA: Diagnosis not present

## 2017-06-09 DIAGNOSIS — M48062 Spinal stenosis, lumbar region with neurogenic claudication: Secondary | ICD-10-CM | POA: Diagnosis not present

## 2017-06-09 DIAGNOSIS — Z4789 Encounter for other orthopedic aftercare: Secondary | ICD-10-CM | POA: Diagnosis not present

## 2017-06-09 DIAGNOSIS — F329 Major depressive disorder, single episode, unspecified: Secondary | ICD-10-CM | POA: Diagnosis not present

## 2017-06-09 DIAGNOSIS — E039 Hypothyroidism, unspecified: Secondary | ICD-10-CM | POA: Diagnosis not present

## 2017-06-09 DIAGNOSIS — M1990 Unspecified osteoarthritis, unspecified site: Secondary | ICD-10-CM | POA: Diagnosis not present

## 2017-06-09 DIAGNOSIS — F419 Anxiety disorder, unspecified: Secondary | ICD-10-CM | POA: Diagnosis not present

## 2017-06-09 DIAGNOSIS — Z7982 Long term (current) use of aspirin: Secondary | ICD-10-CM | POA: Diagnosis not present

## 2017-06-09 DIAGNOSIS — I129 Hypertensive chronic kidney disease with stage 1 through stage 4 chronic kidney disease, or unspecified chronic kidney disease: Secondary | ICD-10-CM | POA: Diagnosis not present

## 2017-06-10 ENCOUNTER — Telehealth (INDEPENDENT_AMBULATORY_CARE_PROVIDER_SITE_OTHER): Payer: Self-pay | Admitting: Orthopaedic Surgery

## 2017-06-10 NOTE — Telephone Encounter (Signed)
Does not need it . I called daughter and discussed.

## 2017-06-10 NOTE — Telephone Encounter (Signed)
Ok for orders? 

## 2017-06-10 NOTE — Telephone Encounter (Signed)
Kelly with [facility] is requesting home health physical therapy verbal orders for this patient -  1-2 times a week for 4 weeks.  CB # [id number]

## 2017-06-10 NOTE — Telephone Encounter (Signed)
Please advise 

## 2017-06-10 NOTE — Telephone Encounter (Signed)
I called and discussed. Thanks  Has appt June 5th

## 2017-06-10 NOTE — Telephone Encounter (Signed)
Patient's daughter called in regards to her mother's medication.  She was looking at her medication list and it shows the hydralazine listed twice, one for 10mg  and one for 25mg .  Is she only suppose to take the 25mg  tablet that was prescribed by Dr. Lorin Mercy?  CB#[phone].  Thank you.

## 2017-06-10 NOTE — Telephone Encounter (Signed)
I left voicemail for [employer].

## 2017-06-13 ENCOUNTER — Inpatient Hospital Stay (INDEPENDENT_AMBULATORY_CARE_PROVIDER_SITE_OTHER): Payer: Medicare Other | Admitting: Orthopaedic Surgery

## 2017-06-17 ENCOUNTER — Telehealth: Payer: Self-pay | Admitting: *Deleted

## 2017-06-17 ENCOUNTER — Other Ambulatory Visit: Payer: Self-pay | Admitting: Nurse Practitioner

## 2017-06-17 NOTE — Telephone Encounter (Signed)
Patient caregiver called and stated that in patient's chart she is listed Hydralazine 10mg  and 25mg . Patient has been taking Hydralazine 10mg  TWO tablets three times daily. Stated that [facility] had increased it but didn't send in new Rx for it because they just doubled up on what they had. Patient is needing a Rx sent to pharmacy for the correct amount. Both 10mg  and 25mg  are listed in medication list. Patient mentioned it to Dr. Lorin Mercy and he told them to call our office. Please Advise.

## 2017-06-17 NOTE — Telephone Encounter (Signed)
If she has been taking 20 mg by mouth three times daily lets refill her for the 25 mg hydralazine three times daily and have her come in for follow up in 1-2 weeks; she recently had surgery so needs to follow up with our office anyway. Thank you

## 2017-06-18 ENCOUNTER — Ambulatory Visit (INDEPENDENT_AMBULATORY_CARE_PROVIDER_SITE_OTHER): Payer: Medicare Other

## 2017-06-18 ENCOUNTER — Ambulatory Visit (INDEPENDENT_AMBULATORY_CARE_PROVIDER_SITE_OTHER): Payer: Medicare Other | Admitting: Orthopaedic Surgery

## 2017-06-18 ENCOUNTER — Other Ambulatory Visit: Payer: Self-pay | Admitting: Nurse Practitioner

## 2017-06-18 ENCOUNTER — Encounter (INDEPENDENT_AMBULATORY_CARE_PROVIDER_SITE_OTHER): Payer: Self-pay | Admitting: Orthopaedic Surgery

## 2017-06-18 VITALS — BP 162/64 | HR 74 | Ht 64.0 in | Wt 185.0 lb

## 2017-06-18 DIAGNOSIS — Z981 Arthrodesis status: Secondary | ICD-10-CM

## 2017-06-18 MED ORDER — HYDRALAZINE HCL 25 MG PO TABS: 25.0000 mg | ORAL_TABLET | Freq: Three times a day (TID) | ORAL | 1 refills | Status: DC

## 2017-06-18 NOTE — Telephone Encounter (Signed)
LMOM to return call.

## 2017-06-18 NOTE — Progress Notes (Signed)
Post-Op Visit Note   Patient: Regina Porter           Date of Birth: [birth year]           MRN: [id number] Visit Date: 06/18/2017 PCP: [name], NP   Assessment & Plan: Post L3-4 fusion.  Recheck 5 weeks she is Artie noted relief of her leg weakness from her severe stenosis.  Dressing changed.  Chief Complaint:  Chief Complaint  Patient presents with  . Lower Back - Routine Post Op   Visit Diagnoses:  1. Status post lumbar spinal fusion     Plan: Return 5 weeks.  X-ray results were reviewed with patient and her daughter.  She is happy with the surgical result so far.  She had problems with nausea with the hydrocodone.  I recommend she just use plain Tylenol that she can take 2 or 3 times a day and then Advil if the Tylenol is not effective.  Follow-Up Instructions: Return in about 5 weeks (around 07/23/2017).   Orders:  Orders Placed This Encounter  Procedures  . XR Lumbar Spine 2-3 Views   No orders of the defined types were placed in this encounter.   Imaging: Xr Lumbar Spine 2-3 Views  Result Date: 06/18/2017 AP lateral lumbar x-rays are obtained which shows pedicle instrumentation cage and interbody fusion at the L3-4 level.  Good position and alignment. Impression: Satisfactory L3-4 fusion with instrumented pedicle screws and cage.   PMFS History: Patient Active Problem List   Diagnosis Date Noted  . Lumbar stenosis 06/04/2017  . Spinal stenosis of lumbar region with neurogenic claudication 03/25/2017  . Bilateral carotid artery stenosis 08/30/2016  . Chronic midline low back pain without sciatica 08/30/2016  . CKD (chronic kidney disease) stage 3, GFR 30-59 ml/min (HCC) 08/30/2016  . Palpitations 04/22/2016  . Left carotid bruit 04/22/2016  . Hypothyroidism   . Hypertension   . Heartburn   . Depression   . Dementia   . Breast cancer ([place])   . Muscle spasm of back 12/19/2014  . Urinary frequency 12/19/2014   Past Medical History:  Diagnosis Date    . Anemia   . Anterolisthesis    grade 1: L3-4  . Anxiety   . Arthritis   . Breast cancer ([place])   . Chronic kidney disease   . Dementia   . Depression   . Dyspnea    W/ PAIN   . Gastroesophageal reflux disease   . Headache    TO SEE NEURO MD  . Heartburn   . Hypertension   . Hypothyroidism   . Leg cramps   . Post-menopause bleeding   . Restless leg syndrome 10/07/2014    Family History  Problem Relation Age of Onset  . Hypertension Mother   . Arthritis Mother   . Heart disease Mother   . Hypertension Father   . Heart disease Father   . Cancer Brother   . Breast cancer Paternal Aunt     Past Surgical History:  Procedure Laterality Date  . BREAST SURGERY Left    Lumpectomy   . CATARACT EXTRACTION, BILATERAL    . hysteroscopy biopsy     Social History   Occupational History  . Occupation: Retired  Tobacco Use  . Smoking status: Former Smoker    Packs/day: 0.10    Years: 20.00    Pack years: 2.00  . Smokeless tobacco: Never Used  Substance and Sexual Activity  . Alcohol use: No  Alcohol/week: 0.0 oz  . Drug use: No  . Sexual activity: Never

## 2017-06-18 NOTE — Telephone Encounter (Signed)
Patient daughter notified and agreed. Medication list updated. Appointment scheduled for 07/02/17. Rx faxed to pharmacy.

## 2017-07-02 ENCOUNTER — Ambulatory Visit: Payer: Self-pay | Admitting: Nurse Practitioner

## 2017-07-02 ENCOUNTER — Telehealth: Payer: Self-pay | Admitting: *Deleted

## 2017-07-02 NOTE — Telephone Encounter (Signed)
Scheduled an appointment to come in Friday due to no energy.

## 2017-07-02 NOTE — Telephone Encounter (Signed)
I do not see these on her medication list from her last OV, was she just taking these at home as supplements? Did someone tell her to stop taking them?

## 2017-07-02 NOTE — Telephone Encounter (Signed)
Patient caregiver called and left message on Clinical intake and stated that patient wants to start back taking her Iron and Vitamin B. Stated that patient is complaining of low energy. Please Advise.   Tried calling patient back and LMOM to return call.  Patient has an appointment on 07/09/17.

## 2017-07-04 ENCOUNTER — Encounter: Payer: Self-pay | Admitting: Nurse Practitioner

## 2017-07-04 ENCOUNTER — Ambulatory Visit (INDEPENDENT_AMBULATORY_CARE_PROVIDER_SITE_OTHER): Payer: Medicare Other | Admitting: Nurse Practitioner

## 2017-07-04 VITALS — BP 158/86 | HR 70 | Temp 98.1°F | Ht 64.0 in | Wt 181.0 lb

## 2017-07-04 DIAGNOSIS — G301 Alzheimer's disease with late onset: Secondary | ICD-10-CM

## 2017-07-04 DIAGNOSIS — N183 Chronic kidney disease, stage 3 unspecified: Secondary | ICD-10-CM

## 2017-07-04 DIAGNOSIS — G8929 Other chronic pain: Secondary | ICD-10-CM | POA: Diagnosis not present

## 2017-07-04 DIAGNOSIS — F0280 Dementia in other diseases classified elsewhere without behavioral disturbance: Secondary | ICD-10-CM

## 2017-07-04 DIAGNOSIS — K5900 Constipation, unspecified: Secondary | ICD-10-CM

## 2017-07-04 DIAGNOSIS — M545 Low back pain, unspecified: Secondary | ICD-10-CM

## 2017-07-04 DIAGNOSIS — G4720 Circadian rhythm sleep disorder, unspecified type: Secondary | ICD-10-CM

## 2017-07-04 DIAGNOSIS — I6523 Occlusion and stenosis of bilateral carotid arteries: Secondary | ICD-10-CM

## 2017-07-04 DIAGNOSIS — F518 Other sleep disorders not due to a substance or known physiological condition: Secondary | ICD-10-CM | POA: Diagnosis not present

## 2017-07-04 DIAGNOSIS — R5383 Other fatigue: Secondary | ICD-10-CM

## 2017-07-04 DIAGNOSIS — I10 Essential (primary) hypertension: Secondary | ICD-10-CM

## 2017-07-04 DIAGNOSIS — E039 Hypothyroidism, unspecified: Secondary | ICD-10-CM

## 2017-07-04 MED ORDER — METOPROLOL SUCCINATE ER 100 MG PO TB24: 100.0000 mg | ORAL_TABLET | Freq: Every day | ORAL | 1 refills | Status: DC

## 2017-07-04 MED ORDER — AMLODIPINE BESYLATE 10 MG PO TABS: 10.0000 mg | ORAL_TABLET | Freq: Every day | ORAL | 1 refills | Status: DC

## 2017-07-04 NOTE — Patient Instructions (Addendum)
Melatonin 3-6 mg by mouth daily at bedtime.   Work on sleep-wake cycle.   Increase colace to daily Can add miralax 17 gm into 6 oz of fluid daily as needed for constipation.   Follow up in 3 months with Dr Eulas Post   Constipation, Adult Constipation is when a person has fewer bowel movements in a week than normal, has difficulty having a bowel movement, or has stools that are dry, hard, or larger than normal. Constipation may be caused by an underlying condition. It may become worse with age if a person takes certain medicines and does not take in enough fluids. Follow these instructions at home: Eating and drinking   Eat foods that have a lot of fiber, such as fresh fruits and vegetables, whole grains, and beans.  Limit foods that are high in fat, low in fiber, or overly processed, such as french fries, hamburgers, cookies, candies, and soda.  Drink enough fluid to keep your urine clear or pale yellow. General instructions  Exercise regularly or as told by your health care provider.  Go to the restroom when you have the urge to go. Do not hold it in.  Take over-the-counter and prescription medicines only as told by your health care provider. These include any fiber supplements.  Practice pelvic floor retraining exercises, such as deep breathing while relaxing the lower abdomen and pelvic floor relaxation during bowel movements.  Watch your condition for any changes.  Keep all follow-up visits as told by your health care provider. This is important. Contact a health care provider if:  You have pain that gets worse.  You have a fever.  You do not have a bowel movement after 4 days.  You vomit.  You are not hungry.  You lose weight.  You are bleeding from the anus.  You have thin, pencil-like stools. Get help right away if:  You have a fever and your symptoms suddenly get worse.  You leak stool or have blood in your stool.  Your abdomen is bloated.  You have  severe pain in your abdomen.  You feel dizzy or you faint. This information is not intended to replace advice given to you by your health care provider. Make sure you discuss any questions you have with your health care provider. Document Released: 09/29/2003 Document Revised: 07/21/2015 Document Reviewed: 06/21/2015 Elsevier Interactive Patient Education  2018 [employer].

## 2017-07-04 NOTE — Progress Notes (Signed)
Careteam: Patient Care Team: [name], NP as PCP - General (Geriatric Medicine) Jettie Booze, MD as PCP - Cardiology (Cardiology) Dustin Folks, MD as Consulting Physician (Hematology and Oncology) Esperanza Heir, MD as Consulting Physician (Obstetrics and Gynecology) Consuella Lose, MD as Consulting Physician (Neurosurgery) Deterding, Jeneen Rinks, MD as Consulting Physician (Nephrology)  Advanced Directive information    Allergies  Allergen Reactions  . Shellfish Allergy Swelling and Rash    MOUTH  . Neomy-Bacit-Polymyx-Pramoxine     UNSPECIFIED REACTION  Not sure which antibiotic she has a reaction to    Chief Complaint  Patient presents with  . Acute Visit    Pt is being seen due to feeling tired and having no energy since surgery on 5/22.   . Other    Daughter in room     HPI: Patient is a 83 y.o. female seen in the office today due to lack of energy  S/p lumbar spinal function in May 2019, reports pain is much better. Taking tylenol as needed for pain. Not needing therapy at this time.  Reports lack of energy. Did not have much energy before surgery but now it is worse. Reports she is always cold.  During hospitalization stopped losartan and increased metoprolol from 50 mg to 100 mg daily and increased hydralazine from 10 mg TID to 25 mg TID.  No shortness of breath, chest pains, swelling.  No blood noted in stools, urine, no unusual bruising. Also not sleeping at night.  Sleeping during the day.  Not doing much during the day.   Doing puzzles, watching TV, reading at night, does not go to bed until 5-6 am and then sleeps into the day.   Review of Systems:  Review of Systems  Constitutional: Negative for chills, fever, malaise/fatigue and weight loss.  HENT: Positive for hearing loss and tinnitus. Negative for congestion.   Eyes: Negative for blurred vision.  Respiratory: Negative for cough and shortness of breath.     Cardiovascular: Negative for chest pain, palpitations and leg swelling.  Gastrointestinal: Negative for abdominal pain, blood in stool, constipation and melena.  Genitourinary: Negative for dysuria.  Musculoskeletal: Positive for back pain. Negative for falls and joint pain.  Skin: Negative for itching and rash.  Neurological: Negative for dizziness, loss of consciousness, weakness and headaches.  Psychiatric/Behavioral: Positive for memory loss. Negative for depression. The patient has insomnia (chronic sleep issues). The patient is not nervous/anxious.     Past Medical History:  Diagnosis Date  . Anemia   . Anterolisthesis    grade 1: L3-4  . Anxiety   . Arthritis   . Breast cancer ([place])   . Chronic kidney disease   . Dementia   . Depression   . Dyspnea    W/ PAIN   . Gastroesophageal reflux disease   . Headache    TO SEE NEURO MD  . Heartburn   . Hypertension   . Hypothyroidism   . Leg cramps   . Post-menopause bleeding   . Restless leg syndrome 10/07/2014   Past Surgical History:  Procedure Laterality Date  . BREAST SURGERY Left    Lumpectomy   . CATARACT EXTRACTION, BILATERAL    . hysteroscopy biopsy     Social History:   reports that she has quit smoking. She has a 2.00 pack-year smoking history. She has never used smokeless tobacco. She reports that she does not drink alcohol or use drugs.  Family History  Problem Relation Age of  Onset  . Hypertension Mother   . Arthritis Mother   . Heart disease Mother   . Hypertension Father   . Heart disease Father   . Cancer Brother   . Breast cancer Paternal Aunt     Medications: Patient's Medications  New Prescriptions   No medications on file  Previous Medications   AMLODIPINE (NORVASC) 10 MG TABLET    TAKE 1 TABLET BY MOUTH EVERY DAY   ASCORBIC ACID (VITAMIN C) 100 MG TABLET    Take 100 mg by mouth daily.   ASPIRIN EC 81 MG TABLET    Take 81 mg by mouth daily.   BUMETANIDE (BUMEX) 2 MG TABLET    Take 2 mg  by mouth daily.   BUSPIRONE (BUSPAR) 7.5 MG TABLET    TAKE 1 TABLET BY MOUTH TWICE A DAY   COD LIVER OIL 1000 MG CAPS    Take 1 capsule by mouth 2 (two) times daily.    DONEPEZIL (ARICEPT) 5 MG TABLET    TAKE 1 TABLET (5 MG TOTAL) BY MOUTH AT BEDTIME.   FUROSEMIDE (LASIX) 40 MG TABLET    TAKE 1 TABLET BY MOUTH EVERY DAY IN THE MORNING   HYDRALAZINE (APRESOLINE) 25 MG TABLET    Take 1 tablet (25 mg total) by mouth every 8 (eight) hours.   HYDROCODONE-ACETAMINOPHEN (NORCO/VICODIN) 5-325 MG TABLET    Take 1-2 tablets by mouth every 6 (six) hours as needed for moderate pain.   LEVOTHYROXINE (SYNTHROID, LEVOTHROID) 100 MCG TABLET    TAKE 1 TABLET (100 MCG TOTAL) BY MOUTH DAILY BEFORE BREAKFAST.   METOPROLOL SUCCINATE (TOPROL-XL) 100 MG 24 HR TABLET    Take 100 mg by mouth at bedtime.   RANITIDINE (ZANTAC) 150 MG TABLET    Take 150 mg by mouth 2 (two) times daily as needed for heartburn.    ROPINIROLE (REQUIP) 1 MG TABLET    TAKE 1 TABLET (1 MG TOTAL) BY MOUTH AT BEDTIME.   TAMOXIFEN (NOLVADEX) 20 MG TABLET    Take 20 mg by mouth daily.   TETRAHYDROZOLINE HCL (VISINE OP)    Place 1 drop into both eyes daily as needed (dry eyes).   TRAZODONE (DESYREL) 50 MG TABLET    TAKE 1 TABLET (50 MG TOTAL) BY MOUTH AT BEDTIME.   VITAMIN A ([employer] VITAMIN A) 10000 UNIT CAPSULE    Take 10,000 Units by mouth daily.   Modified Medications   No medications on file  Discontinued Medications   FAMOTIDINE (PEPCID) 20 MG TABLET    Take 20 mg by mouth 2 (two) times daily.   FAMOTIDINE (PEPCID) 20 MG TABLET    TAKE 1 TABLET (20 MG TOTAL) BY MOUTH 2 (TWO) TIMES DAILY. FOR ACID REFLUX   LOSARTAN (COZAAR) 50 MG TABLET       METOPROLOL SUCCINATE (TOPROL-XL) 50 MG 24 HR TABLET    TAKE 1 TABLET BY MOUTH DAILY WITH OR IMMEDIATELY FOLLOWING A MEAL     Physical Exam:  Vitals:   07/04/17 0957  BP: (!) 158/86  Pulse: 70  Temp: 98.1 F (36.7 C)  TempSrc: Oral  SpO2: 97%  Weight: 181 lb (82.1 kg)  Height: 5\' 4"  (1.626 m)    Body mass index is 31.07 kg/m.  Physical Exam  Constitutional: She appears well-developed and well-nourished. No distress.  HENT:  Head: Normocephalic and atraumatic.  Right Ear: External ear normal.  Left Ear: External ear normal.  Nose: Nose normal.  Mouth/Throat: Oropharynx is clear and moist. No  oropharyngeal exudate.  Eyes: Pupils are equal, round, and reactive to light. Conjunctivae are normal.  Cardiovascular: Normal rate and regular rhythm.  Murmur heard. Pulmonary/Chest: Effort normal and breath sounds normal.  Abdominal: Soft. Bowel sounds are normal.  Musculoskeletal: She exhibits no edema or tenderness.  Neurological: She is alert.  Skin: Skin is warm and dry. She is not diaphoretic.  Psychiatric: She has a normal mood and affect.  Mild short term memory loss    Labs reviewed: Basic Metabolic Panel: Recent Labs    01/22/17 1508  06/04/17 1701 06/05/17 0357 06/06/17 0456  NA 136   < > 141 141 139  K 4.7   < > 4.4 5.4* 4.5  CL 101   < > 109 108 107  CO2 27   < > 25 26 23   GLUCOSE 99   < > 179* 149* 129*  BUN 26*   < > 31* 27* 19  CREATININE 2.23*   < > 2.30* 2.07* 1.67*  CALCIUM 9.5   < > 8.7* 8.8* 8.7*  TSH 1.93  --   --   --   --    < > = values in this interval not displayed.   Liver Function Tests: Recent Labs    09/25/16 0822 04/07/17 1206 05/30/17 1451  AST 24 22 24   ALT 19 14 19   ALKPHOS  --   --  36*  BILITOT 0.3 0.4 0.5  PROT 6.9 7.9 7.7  ALBUMIN  --   --  3.8   No results for input(s): LIPASE, AMYLASE in the last 8760 hours. No results for input(s): AMMONIA in the last 8760 hours. CBC: Recent Labs    09/25/16 0822 01/22/17 1508 04/07/17 1206 05/30/17 1451 06/05/17 0357  WBC 3.7* 5.0 5.1 5.2 10.1  NEUTROABS 1,262* 2,205 2,647  --   --   HGB 11.3* 11.0* 11.7 11.0* 9.3*  HCT 32.5* 32.1* 34.8* 33.9* 28.2*  MCV 89.5 88.9 90.6 91.4 91.6  PLT 237 250 281 274 223   Lipid Panel: Recent Labs    09/25/16 0822  CHOL 171  HDL 37*   LDLCALC 112*  TRIG 109  CHOLHDL 4.6   TSH: Recent Labs    01/22/17 1508  TSH 1.93   A1C: No results found for: HGBA1C   Assessment/Plan 1. Essential hypertension Uncontrolled, will increase norvasc to 10 mg by mouth daily. To continue oh hydralazine, metoprolol succinate 100 mg daily, lasix and bumex  -low sodium diet given.  - amLODipine (NORVASC) 10 MG tablet; Take 1 tablet (10 mg total) by mouth daily.  Dispense: 90 tablet; Refill: 1 - CBC with Differential/Platelets - COMPLETE METABOLIC PANEL WITH GFR - metoprolol succinate (TOPROL-XL) 100 MG 24 hr tablet; Take 1 tablet (100 mg total) by mouth at bedtime.  Dispense: 90 tablet; Refill: 1  2. Fatigue, unspecified type -ongoing, pt with sedentary lifestyle. Encouraged to increase activity. To attempt to get sleep at night vs staying up. Proper nutrition encouraged. Will follow up labs. She is post-op lumbar spinal fusion.  - CBC with Differential/Platelets - COMPLETE METABOLIC PANEL WITH GFR - TSH - Vitamin D, 25-hydroxy  3. Disrupted sleep-wake cycle -education provided to work on sleep routine, can use metlatonin PRN. - Vitamin D, 25-hydroxy  4. CKD (chronic kidney disease) stage 3, GFR 30-59 ml/min (HCC) Will follow up lab work at this time, continues to follow up with nephrology. - Vitamin D, 25-hydroxy  5. Hypothyroidism, unspecified type -will follow up labs, continues on synthroid 100  mcg daily - Vitamin D, 25-hydroxy  6. Chronic midline low back pain without sciatica Improved post op, not as active as she could be per daughter, PT on hold for now. Pain is controlled.   7. Late onset Alzheimer's disease without behavioral disturbance Stable, without significant change  8. Constipation, unspecified constipation type Increase colace to daily Can add miralax 17 gm into 6 oz of fluid daily as needed for constipation.   Next appt: 3 months with Dr Eulas Post, sooner if needed  [place] [facility] [place],  [facility] [phone]

## 2017-07-05 LAB — CBC WITH DIFFERENTIAL/PLATELET
Basophils Absolute: 29 cells/uL (ref 0–200)
Basophils Relative: 0.6 %
Eosinophils Absolute: 130 cells/uL (ref 15–500)
Eosinophils Relative: 2.7 %
HCT: 29.2 % — ABNORMAL LOW (ref 35.0–45.0)
Hemoglobin: 9.9 g/dL — ABNORMAL LOW (ref 11.7–15.5)
Lymphs Abs: 1685 cells/uL (ref 850–3900)
MCH: 30.6 pg (ref 27.0–33.0)
MCHC: 33.9 g/dL (ref 32.0–36.0)
MCV: 90.1 fL (ref 80.0–100.0)
MPV: 10.6 fL (ref 7.5–12.5)
Monocytes Relative: 10.7 %
Neutro Abs: 2443 cells/uL (ref 1500–7800)
Neutrophils Relative %: 50.9 %
Platelets: 245 10*3/uL (ref 140–400)
RBC: 3.24 10*6/uL — ABNORMAL LOW (ref 3.80–5.10)
RDW: 13.8 % (ref 11.0–15.0)
Total Lymphocyte: 35.1 %
WBC mixed population: 514 cells/uL (ref 200–950)
WBC: 4.8 10*3/uL (ref 3.8–10.8)

## 2017-07-05 LAB — COMPLETE METABOLIC PANEL WITH GFR
AG Ratio: 1.2 (calc) (ref 1.0–2.5)
ALT: 12 U/L (ref 6–29)
AST: 17 U/L (ref 10–35)
Albumin: 3.8 g/dL (ref 3.6–5.1)
Alkaline phosphatase (APISO): 42 U/L (ref 33–130)
BUN/Creatinine Ratio: 12 (calc) (ref 6–22)
BUN: 23 mg/dL (ref 7–25)
CO2: 27 mmol/L (ref 20–32)
Calcium: 9.2 mg/dL (ref 8.6–10.4)
Chloride: 104 mmol/L (ref 98–110)
Creat: 1.85 mg/dL — ABNORMAL HIGH (ref 0.60–0.88)
GFR, Est African American: 29 mL/min/{1.73_m2} — ABNORMAL LOW (ref >=60)
GFR, Est Non African American: 25 mL/min/{1.73_m2} — ABNORMAL LOW (ref >=60)
Globulin: 3.3 g/dL (calc) (ref 1.9–3.7)
Glucose, Bld: 114 mg/dL — ABNORMAL HIGH (ref 65–99)
Potassium: 4.2 mmol/L (ref 3.5–5.3)
Sodium: 140 mmol/L (ref 135–146)
Total Bilirubin: 0.3 mg/dL (ref 0.2–1.2)
Total Protein: 7.1 g/dL (ref 6.1–8.1)

## 2017-07-05 LAB — TSH: TSH: 0.15 mIU/L — ABNORMAL LOW (ref 0.40–4.50)

## 2017-07-05 LAB — VITAMIN D 25 HYDROXY (VIT D DEFICIENCY, FRACTURES): Vit D, 25-Hydroxy: 43 ng/mL (ref 30–100)

## 2017-07-07 ENCOUNTER — Other Ambulatory Visit: Payer: Self-pay | Admitting: Nurse Practitioner

## 2017-07-07 MED ORDER — LEVOTHYROXINE SODIUM 88 MCG PO TABS: 88.0000 ug | ORAL_TABLET | Freq: Every day | ORAL | 3 refills | Status: DC

## 2017-07-09 ENCOUNTER — Ambulatory Visit: Payer: Self-pay | Admitting: Nurse Practitioner

## 2017-07-10 ENCOUNTER — Other Ambulatory Visit: Payer: Self-pay | Admitting: Nurse Practitioner

## 2017-07-11 DIAGNOSIS — N390 Urinary tract infection, site not specified: Secondary | ICD-10-CM | POA: Diagnosis not present

## 2017-07-11 DIAGNOSIS — C50919 Malignant neoplasm of unspecified site of unspecified female breast: Secondary | ICD-10-CM | POA: Diagnosis not present

## 2017-07-11 DIAGNOSIS — N184 Chronic kidney disease, stage 4 (severe): Secondary | ICD-10-CM | POA: Diagnosis not present

## 2017-07-11 DIAGNOSIS — E039 Hypothyroidism, unspecified: Secondary | ICD-10-CM | POA: Diagnosis not present

## 2017-07-11 DIAGNOSIS — K219 Gastro-esophageal reflux disease without esophagitis: Secondary | ICD-10-CM | POA: Diagnosis not present

## 2017-07-11 DIAGNOSIS — N2581 Secondary hyperparathyroidism of renal origin: Secondary | ICD-10-CM | POA: Diagnosis not present

## 2017-07-11 DIAGNOSIS — I129 Hypertensive chronic kidney disease with stage 1 through stage 4 chronic kidney disease, or unspecified chronic kidney disease: Secondary | ICD-10-CM | POA: Diagnosis not present

## 2017-07-11 DIAGNOSIS — I739 Peripheral vascular disease, unspecified: Secondary | ICD-10-CM | POA: Diagnosis not present

## 2017-07-11 DIAGNOSIS — G2581 Restless legs syndrome: Secondary | ICD-10-CM | POA: Diagnosis not present

## 2017-07-11 DIAGNOSIS — F329 Major depressive disorder, single episode, unspecified: Secondary | ICD-10-CM | POA: Diagnosis not present

## 2017-07-11 DIAGNOSIS — D631 Anemia in chronic kidney disease: Secondary | ICD-10-CM | POA: Diagnosis not present

## 2017-07-11 DIAGNOSIS — F0390 Unspecified dementia without behavioral disturbance: Secondary | ICD-10-CM | POA: Diagnosis not present

## 2017-07-18 ENCOUNTER — Other Ambulatory Visit: Payer: Self-pay | Admitting: *Deleted

## 2017-07-23 ENCOUNTER — Encounter (INDEPENDENT_AMBULATORY_CARE_PROVIDER_SITE_OTHER): Payer: Self-pay | Admitting: Orthopaedic Surgery

## 2017-07-23 ENCOUNTER — Ambulatory Visit (INDEPENDENT_AMBULATORY_CARE_PROVIDER_SITE_OTHER): Payer: Medicare Other | Admitting: Orthopaedic Surgery

## 2017-07-23 ENCOUNTER — Ambulatory Visit (INDEPENDENT_AMBULATORY_CARE_PROVIDER_SITE_OTHER): Payer: Medicare Other

## 2017-07-23 DIAGNOSIS — Z981 Arthrodesis status: Secondary | ICD-10-CM | POA: Diagnosis not present

## 2017-07-23 NOTE — Progress Notes (Signed)
Post-Op Visit Note   Patient: Regina Porter           Date of Birth: 1934/07/02           MRN: [id number] Visit Date: 07/23/2017 PCP: [name], NP   Assessment & Plan: Post L3-4 instrumented fusion for stenosis.  She will work her way from her walker to a cane.  Increase her walking distance.  She is had some problems with constipation using a laxative and plain radiograph showed good position and alignment.  We gave her some gloves she can disimpact herself.  She can consider getting of enema at the drugstore.  She will call if she has problems.  Office follow-up 1 month.  2 view x-ray on return.  Chief Complaint:  Chief Complaint  Patient presents with  . Lower Back - Routine Post Op, Follow-up   Visit Diagnoses:  1. Status post lumbar spinal fusion     Plan: Return 1 month.  Follow-Up Instructions: No follow-ups on file.   Orders:  Orders Placed This Encounter  Procedures  . XR Lumbar Spine 2-3 Views   No orders of the defined types were placed in this encounter.   Imaging: Xr Lumbar Spine 2-3 Views  Result Date: 07/23/2017 AP lateral lumbar x-rays demonstrate L3 4T lift.  Good position of cage and screws. Impression: Postop L3-4 instrumented fusion satisfactory position and alignment of hardware cage and graft.   PMFS History: Patient Active Problem List   Diagnosis Date Noted  . Lumbar stenosis 06/04/2017  . Spinal stenosis of lumbar region with neurogenic claudication 03/25/2017  . Bilateral carotid artery stenosis 08/30/2016  . Chronic midline low back pain without sciatica 08/30/2016  . CKD (chronic kidney disease) stage 3, GFR 30-59 ml/min (HCC) 08/30/2016  . Palpitations 04/22/2016  . Left carotid bruit 04/22/2016  . Hypothyroidism   . Hypertension   . Heartburn   . Depression   . Dementia   . Breast cancer ([place])   . Muscle spasm of back 12/19/2014  . Urinary frequency 12/19/2014   Past Medical History:  Diagnosis Date  . Anemia   .  Anterolisthesis    grade 1: L3-4  . Anxiety   . Arthritis   . Breast cancer ([place])   . Chronic kidney disease   . Dementia   . Depression   . Dyspnea    W/ PAIN   . Gastroesophageal reflux disease   . Headache    TO SEE NEURO MD  . Heartburn   . Hypertension   . Hypothyroidism   . Leg cramps   . Post-menopause bleeding   . Restless leg syndrome 10/07/2014    Family History  Problem Relation Age of Onset  . Hypertension Mother   . Arthritis Mother   . Heart disease Mother   . Hypertension Father   . Heart disease Father   . Cancer Brother   . Breast cancer Paternal Aunt     Past Surgical History:  Procedure Laterality Date  . BREAST SURGERY Left    Lumpectomy   . CATARACT EXTRACTION, BILATERAL    . hysteroscopy biopsy     Social History   Occupational History  . Occupation: Retired  Tobacco Use  . Smoking status: Former Smoker    Packs/day: 0.10    Years: 20.00    Pack years: 2.00  . Smokeless tobacco: Never Used  Substance and Sexual Activity  . Alcohol use: No    Alcohol/week: 0.0 oz  .  Drug use: No  . Sexual activity: Never

## 2017-07-24 ENCOUNTER — Other Ambulatory Visit: Payer: Self-pay | Admitting: Nurse Practitioner

## 2017-07-24 DIAGNOSIS — K219 Gastro-esophageal reflux disease without esophagitis: Secondary | ICD-10-CM

## 2017-08-26 ENCOUNTER — Ambulatory Visit (INDEPENDENT_AMBULATORY_CARE_PROVIDER_SITE_OTHER): Payer: Medicare Other

## 2017-08-26 ENCOUNTER — Encounter (INDEPENDENT_AMBULATORY_CARE_PROVIDER_SITE_OTHER): Payer: Self-pay | Admitting: Orthopaedic Surgery

## 2017-08-26 ENCOUNTER — Ambulatory Visit (INDEPENDENT_AMBULATORY_CARE_PROVIDER_SITE_OTHER): Payer: Medicare Other | Admitting: Orthopaedic Surgery

## 2017-08-26 VITALS — BP 157/66 | HR 74 | Ht 64.0 in | Wt 181.0 lb

## 2017-08-26 DIAGNOSIS — Z981 Arthrodesis status: Secondary | ICD-10-CM

## 2017-08-26 NOTE — Progress Notes (Signed)
Post-Op Visit Note   Patient: Regina Porter           Date of Birth: 1934/08/18           MRN: [id number] Visit Date: 08/26/2017 PCP: [name], NP   Assessment & Plan: Post L3-4 fusion.  She is walking about 5 minutes and then rest.  Preop times symptoms are gone but she had several years of decreased activity due to neurogenic claudication symptoms and needs to gradually increase her ambulation distance and time to build up endurance and stamina.  She is using a walker when she goes out of the house.  She can wean herself out of the brace increase her ambulation distance and I will check her back again on an as-needed basis.  She is very happy with the surgical result.  Chief Complaint:  Chief Complaint  Patient presents with  . Lower Back - Follow-up    06/04/17 L3-4 TLIF, pedicle instrumentation, cage, Gill procedure   Visit Diagnoses:  1. Status post lumbar spinal fusion     Plan: Discontinue brace increase distance she walks, wean herself to a cane when she is out ambulating in the community and then she can progress to joining Silver sneakers etc.  Follow-Up Instructions: No follow-ups on file.   Orders:  Orders Placed This Encounter  Procedures  . XR Lumbar Spine 2-3 Views   No orders of the defined types were placed in this encounter.   Imaging: No results found.  PMFS History: Patient Active Problem List   Diagnosis Date Noted  . Lumbar stenosis 06/04/2017  . Spinal stenosis of lumbar region with neurogenic claudication 03/25/2017  . Bilateral carotid artery stenosis 08/30/2016  . Chronic midline low back pain without sciatica 08/30/2016  . CKD (chronic kidney disease) stage 3, GFR 30-59 ml/min (HCC) 08/30/2016  . Palpitations 04/22/2016  . Left carotid bruit 04/22/2016  . Hypothyroidism   . Hypertension   . Heartburn   . Depression   . Dementia   . Breast cancer ([place])   . Muscle spasm of back 12/19/2014  . Urinary frequency 12/19/2014   Past  Medical History:  Diagnosis Date  . Anemia   . Anterolisthesis    grade 1: L3-4  . Anxiety   . Arthritis   . Breast cancer ([place])   . Chronic kidney disease   . Dementia   . Depression   . Dyspnea    W/ PAIN   . Gastroesophageal reflux disease   . Headache    TO SEE NEURO MD  . Heartburn   . Hypertension   . Hypothyroidism   . Leg cramps   . Post-menopause bleeding   . Restless leg syndrome 10/07/2014    Family History  Problem Relation Age of Onset  . Hypertension Mother   . Arthritis Mother   . Heart disease Mother   . Hypertension Father   . Heart disease Father   . Cancer Brother   . Breast cancer Paternal Aunt     Past Surgical History:  Procedure Laterality Date  . BREAST SURGERY Left    Lumpectomy   . CATARACT EXTRACTION, BILATERAL    . hysteroscopy biopsy     Social History   Occupational History  . Occupation: Retired  Tobacco Use  . Smoking status: Former Smoker    Packs/day: 0.10    Years: 20.00    Pack years: 2.00  . Smokeless tobacco: Never Used  Substance and Sexual Activity  .  Alcohol use: No    Alcohol/week: 0.0 standard drinks  . Drug use: No  . Sexual activity: Never

## 2017-08-28 ENCOUNTER — Other Ambulatory Visit: Payer: Self-pay

## 2017-08-28 DIAGNOSIS — E039 Hypothyroidism, unspecified: Secondary | ICD-10-CM

## 2017-08-30 ENCOUNTER — Other Ambulatory Visit: Payer: Self-pay | Admitting: Nurse Practitioner

## 2017-09-01 ENCOUNTER — Other Ambulatory Visit: Payer: Medicare Other

## 2017-09-01 DIAGNOSIS — E039 Hypothyroidism, unspecified: Secondary | ICD-10-CM

## 2017-09-01 LAB — TSH: TSH: 0.48 mIU/L (ref 0.40–4.50)

## 2017-09-07 ENCOUNTER — Other Ambulatory Visit: Payer: Self-pay | Admitting: Nurse Practitioner

## 2017-09-08 DIAGNOSIS — N184 Chronic kidney disease, stage 4 (severe): Secondary | ICD-10-CM | POA: Diagnosis not present

## 2017-09-08 DIAGNOSIS — F0390 Unspecified dementia without behavioral disturbance: Secondary | ICD-10-CM | POA: Diagnosis not present

## 2017-09-08 DIAGNOSIS — C50919 Malignant neoplasm of unspecified site of unspecified female breast: Secondary | ICD-10-CM | POA: Diagnosis not present

## 2017-09-08 DIAGNOSIS — D631 Anemia in chronic kidney disease: Secondary | ICD-10-CM | POA: Diagnosis not present

## 2017-09-08 DIAGNOSIS — Z0000 Encounter for general adult medical examination without abnormal findings: Secondary | ICD-10-CM | POA: Diagnosis not present

## 2017-09-08 DIAGNOSIS — K219 Gastro-esophageal reflux disease without esophagitis: Secondary | ICD-10-CM | POA: Diagnosis not present

## 2017-09-08 DIAGNOSIS — F419 Anxiety disorder, unspecified: Secondary | ICD-10-CM | POA: Diagnosis not present

## 2017-09-08 DIAGNOSIS — G2581 Restless legs syndrome: Secondary | ICD-10-CM | POA: Diagnosis not present

## 2017-09-08 DIAGNOSIS — N2581 Secondary hyperparathyroidism of renal origin: Secondary | ICD-10-CM | POA: Diagnosis not present

## 2017-09-08 DIAGNOSIS — I739 Peripheral vascular disease, unspecified: Secondary | ICD-10-CM | POA: Diagnosis not present

## 2017-09-08 DIAGNOSIS — E039 Hypothyroidism, unspecified: Secondary | ICD-10-CM | POA: Diagnosis not present

## 2017-09-08 DIAGNOSIS — I129 Hypertensive chronic kidney disease with stage 1 through stage 4 chronic kidney disease, or unspecified chronic kidney disease: Secondary | ICD-10-CM | POA: Diagnosis not present

## 2017-09-11 DIAGNOSIS — Z79811 Long term (current) use of aromatase inhibitors: Secondary | ICD-10-CM | POA: Diagnosis not present

## 2017-09-11 DIAGNOSIS — Z853 Personal history of malignant neoplasm of breast: Secondary | ICD-10-CM | POA: Diagnosis not present

## 2017-09-11 DIAGNOSIS — R5383 Other fatigue: Secondary | ICD-10-CM | POA: Diagnosis not present

## 2017-09-22 ENCOUNTER — Ambulatory Visit: Payer: Medicare Other | Admitting: Internal Medicine

## 2017-10-05 ENCOUNTER — Other Ambulatory Visit: Payer: Self-pay | Admitting: Internal Medicine

## 2017-10-05 DIAGNOSIS — G3184 Mild cognitive impairment, so stated: Secondary | ICD-10-CM

## 2017-10-07 ENCOUNTER — Ambulatory Visit: Payer: Medicare Other | Admitting: Internal Medicine

## 2017-10-07 DIAGNOSIS — Z780 Asymptomatic menopausal state: Secondary | ICD-10-CM | POA: Diagnosis not present

## 2017-10-07 DIAGNOSIS — Z79811 Long term (current) use of aromatase inhibitors: Secondary | ICD-10-CM | POA: Diagnosis not present

## 2017-10-07 DIAGNOSIS — Z853 Personal history of malignant neoplasm of breast: Secondary | ICD-10-CM | POA: Diagnosis not present

## 2017-10-07 DIAGNOSIS — R928 Other abnormal and inconclusive findings on diagnostic imaging of breast: Secondary | ICD-10-CM | POA: Diagnosis not present

## 2017-10-07 DIAGNOSIS — Z13820 Encounter for screening for osteoporosis: Secondary | ICD-10-CM | POA: Diagnosis not present

## 2017-10-07 DIAGNOSIS — M8588 Other specified disorders of bone density and structure, other site: Secondary | ICD-10-CM | POA: Diagnosis not present

## 2017-10-14 ENCOUNTER — Encounter: Payer: Self-pay | Admitting: Nurse Practitioner

## 2017-10-14 ENCOUNTER — Ambulatory Visit (INDEPENDENT_AMBULATORY_CARE_PROVIDER_SITE_OTHER): Payer: Medicare Other | Admitting: Nurse Practitioner

## 2017-10-14 VITALS — BP 160/64 | HR 65 | Temp 98.3°F | Ht 64.0 in | Wt 181.4 lb

## 2017-10-14 DIAGNOSIS — K219 Gastro-esophageal reflux disease without esophagitis: Secondary | ICD-10-CM | POA: Diagnosis not present

## 2017-10-14 DIAGNOSIS — N183 Chronic kidney disease, stage 3 unspecified: Secondary | ICD-10-CM

## 2017-10-14 DIAGNOSIS — Z23 Encounter for immunization: Secondary | ICD-10-CM | POA: Diagnosis not present

## 2017-10-14 DIAGNOSIS — G8929 Other chronic pain: Secondary | ICD-10-CM

## 2017-10-14 DIAGNOSIS — I6523 Occlusion and stenosis of bilateral carotid arteries: Secondary | ICD-10-CM | POA: Diagnosis not present

## 2017-10-14 DIAGNOSIS — I10 Essential (primary) hypertension: Secondary | ICD-10-CM | POA: Diagnosis not present

## 2017-10-14 DIAGNOSIS — G4762 Sleep related leg cramps: Secondary | ICD-10-CM | POA: Diagnosis not present

## 2017-10-14 DIAGNOSIS — M545 Low back pain, unspecified: Secondary | ICD-10-CM

## 2017-10-14 DIAGNOSIS — E039 Hypothyroidism, unspecified: Secondary | ICD-10-CM

## 2017-10-14 MED ORDER — HYDRALAZINE HCL 50 MG PO TABS: 50.0000 mg | ORAL_TABLET | Freq: Three times a day (TID) | ORAL | 0 refills | Status: DC

## 2017-10-14 NOTE — Patient Instructions (Addendum)
Increase hydralazine to 50 mg by mouth every 8 hours. To notify if blood pressure is low ( less than 100/70)   To follow up in 2 weeks on blood pressure-- bring blood pressure readings

## 2017-10-14 NOTE — Progress Notes (Signed)
Careteam: Patient Care Team: [name], NP as PCP - General (Geriatric Medicine) Jettie Booze, MD as PCP - Cardiology (Cardiology) Dustin Folks, MD as Consulting Physician (Hematology and Oncology) Esperanza Heir, MD as Consulting Physician (Obstetrics and Gynecology) Consuella Lose, MD as Consulting Physician (Neurosurgery) Deterding, Jeneen Rinks, MD as Consulting Physician (Nephrology)  Advanced Directive information    Allergies  Allergen Reactions  . Shellfish Allergy Swelling and Rash    MOUTH  . Neomy-Bacit-Polymyx-Pramoxine     UNSPECIFIED REACTION  Not sure which antibiotic she has a reaction to    Chief Complaint  Patient presents with  . Medical Management of Chronic Issues    4 month follow up   . Immunizations    Patient would like to receive flu vaccine     HPI: Patient is a 83 y.o. female seen in the office today for routine follow up.   Chronic back pain- improved lower back pain. Taking tylenol daily.  Hypertension- blood pressure is elevated at home as well- 180s/something but notes that it has been elevated.  Taking norvasc 10 mg, lasix 40 mg, hydralazine 25 mg every 8 hours (only taking twice daily) cozaar 100 mg at bedtime, metoprolol succinate 100 mg by mouth daily   Insomnia- using trazodone 50 mg by mouth daily at bedtime.   Anxiety- taking buspar twice daily which is controlling symptoms.   CKD- continues to follow up with nephrologist.   Hx of breast cancer- following with oncologist at [facility], remains on tamoxifen. Had mammogram at that time   GERD- improved at this time. Has not needed ranitidine    Review of Systems:  Review of Systems  Constitutional: Negative for chills, fever, malaise/fatigue and weight loss.  HENT: Positive for hearing loss and tinnitus. Negative for congestion.   Eyes: Negative for blurred vision.  Respiratory: Negative for cough and shortness of breath.   Cardiovascular:  Negative for chest pain, palpitations and leg swelling.  Gastrointestinal: Negative for abdominal pain, blood in stool, constipation and melena.  Genitourinary: Negative for dysuria.  Musculoskeletal: Positive for back pain (upper back). Negative for falls and joint pain.  Skin: Negative for itching and rash.  Neurological: Negative for dizziness, loss of consciousness, weakness and headaches.  Psychiatric/Behavioral: Positive for memory loss. Negative for depression. The patient has insomnia (chronic sleep issues). The patient is not nervous/anxious.     Past Medical History:  Diagnosis Date  . Anemia   . Anterolisthesis    grade 1: L3-4  . Anxiety   . Arthritis   . Breast cancer ([place])   . Chronic kidney disease   . Dementia ([place])   . Depression   . Dyspnea    W/ PAIN   . Gastroesophageal reflux disease   . Headache    TO SEE NEURO MD  . Heartburn   . Hypertension   . Hypothyroidism   . Leg cramps   . Post-menopause bleeding   . Restless leg syndrome 10/07/2014   Past Surgical History:  Procedure Laterality Date  . BREAST SURGERY Left    Lumpectomy   . CATARACT EXTRACTION, BILATERAL    . hysteroscopy biopsy     Social History:   reports that she has quit smoking. She has a 2.00 pack-year smoking history. She has never used smokeless tobacco. She reports that she does not drink alcohol or use drugs.  Family History  Problem Relation Age of Onset  . Hypertension Mother   . Arthritis Mother   .  Heart disease Mother   . Hypertension Father   . Heart disease Father   . Cancer Brother   . Breast cancer Paternal Aunt     Medications: Patient's Medications  New Prescriptions   No medications on file  Previous Medications   ACETAMINOPHEN (TYLENOL) 500 MG TABLET    Take 500 mg by mouth daily as needed.   AMLODIPINE (NORVASC) 10 MG TABLET    Take 1 tablet (10 mg total) by mouth daily.   ASPIRIN EC 81 MG TABLET    Take 81 mg by mouth daily.   BUMETANIDE (BUMEX) 2 MG  TABLET    Take 2 mg by mouth daily.   BUSPIRONE (BUSPAR) 7.5 MG TABLET    TAKE 1 TABLET BY MOUTH TWICE A DAY   COD LIVER OIL 1000 MG CAPS    Take 1 capsule by mouth 2 (two) times daily.    DONEPEZIL (ARICEPT) 5 MG TABLET    TAKE 1 TABLET (5 MG TOTAL) BY MOUTH AT BEDTIME.   FUROSEMIDE (LASIX) 40 MG TABLET    TAKE 1 TABLET BY MOUTH EVERY DAY IN THE MORNING   HYDRALAZINE (APRESOLINE) 25 MG TABLET    TAKE 1 TABLET (25 MG TOTAL) BY MOUTH EVERY 8 (EIGHT) HOURS.   LEVOTHYROXINE (SYNTHROID, LEVOTHROID) 88 MCG TABLET    Take 1 tablet (88 mcg total) by mouth daily.   LOSARTAN (COZAAR) 100 MG TABLET    TAKE 1 TABLET BY MOUTH EVERYDAY AT BEDTIME   METOPROLOL SUCCINATE (TOPROL-XL) 100 MG 24 HR TABLET    Take 1 tablet (100 mg total) by mouth at bedtime.   RANITIDINE (ZANTAC) 150 MG TABLET    Take 150 mg by mouth 2 (two) times daily as needed for heartburn.    TAMOXIFEN (NOLVADEX) 20 MG TABLET    Take 20 mg by mouth daily.   TETRAHYDROZOLINE HCL (VISINE OP)    Place 1 drop into both eyes daily as needed (dry eyes).   TRAZODONE (DESYREL) 50 MG TABLET    TAKE 1 TABLET (50 MG TOTAL) BY MOUTH AT BEDTIME.  Modified Medications   No medications on file  Discontinued Medications   ASCORBIC ACID (VITAMIN C) 100 MG TABLET    Take 100 mg by mouth daily.   ROPINIROLE (REQUIP) 1 MG TABLET    TAKE 1 TABLET (1 MG TOTAL) BY MOUTH AT BEDTIME.   VITAMIN A ([employer] VITAMIN A) 10000 UNIT CAPSULE    Take 10,000 Units by mouth daily.      Physical Exam:  Vitals:   10/14/17 1435  BP: (!) 160/64  Pulse: 65  Temp: 98.3 F (36.8 C)  TempSrc: Oral  SpO2: 98%  Weight: 181 lb 6.4 oz (82.3 kg)  Height: 5\' 4"  (1.626 m)   Body mass index is 31.14 kg/m.  Physical Exam  Constitutional: She appears well-developed and well-nourished. No distress.  HENT:  Head: Normocephalic and atraumatic.  Mouth/Throat: No oropharyngeal exudate.  Eyes: Pupils are equal, round, and reactive to light. Conjunctivae are normal.  Cardiovascular:  Normal rate and regular rhythm.  Murmur heard. Pulmonary/Chest: Effort normal and breath sounds normal.  Abdominal: Soft. Bowel sounds are normal.  Musculoskeletal: She exhibits no edema or tenderness.  Neurological: She is alert.  Skin: Skin is warm and dry. She is not diaphoretic.  Psychiatric: She has a normal mood and affect.  Mild short term memory loss    Labs reviewed: Basic Metabolic Panel: Recent Labs    01/22/17 1508  06/05/17 0357 06/06/17  5732 07/04/17 1036 09/01/17 1335  NA 136   < > 141 139 140  --   K 4.7   < > 5.4* 4.5 4.2  --   CL 101   < > 108 107 104  --   CO2 27   < > 26 23 27   --   GLUCOSE 99   < > 149* 129* 114*  --   BUN 26*   < > 27* 19 23  --   CREATININE 2.23*   < > 2.07* 1.67* 1.85*  --   CALCIUM 9.5   < > 8.8* 8.7* 9.2  --   TSH 1.93  --   --   --  0.15* 0.48   < > = values in this interval not displayed.   Liver Function Tests: Recent Labs    04/07/17 1206 05/30/17 1451 07/04/17 1036  AST 22 24 17   ALT 14 19 12   ALKPHOS  --  36*  --   BILITOT 0.4 0.5 0.3  PROT 7.9 7.7 7.1  ALBUMIN  --  3.8  --    No results for input(s): LIPASE, AMYLASE in the last 8760 hours. No results for input(s): AMMONIA in the last 8760 hours. CBC: Recent Labs    01/22/17 1508 04/07/17 1206 05/30/17 1451 06/05/17 0357 07/04/17 1036  WBC 5.0 5.1 5.2 10.1 4.8  NEUTROABS 2,205 2,647  --   --  2,443  HGB 11.0* 11.7 11.0* 9.3* 9.9*  HCT 32.1* 34.8* 33.9* 28.2* 29.2*  MCV 88.9 90.6 91.4 91.6 90.1  PLT 250 281 274 223 245   Lipid Panel: No results for input(s): CHOL, HDL, LDLCALC, TRIG, CHOLHDL, LDLDIRECT in the last 8760 hours. TSH: Recent Labs    01/22/17 1508 07/04/17 1036 09/01/17 1335  TSH 1.93 0.15* 0.48   A1C: No results found for: HGBA1C   Assessment/Plan 1. CKD (chronic kidney disease) stage 3, GFR 30-59 ml/min (HCC) -continues to follow up with nephrology at this time, renal function continues to worsen. Avoiding NSAIDS and optimizing  blood pressure control. Avoiding PPI at this time - US Renal Artery Stenosis; Future  2. Essential hypertension -uncontrolled. Will increase hydralazine to 50 mg TID and have pt take blood pressure and record. Low sodium diet encouraged - US Renal Artery Stenosis; Future - hydrALAZINE (APRESOLINE) 50 MG tablet; Take 1 tablet (50 mg total) by mouth every 8 (eight) hours.  Dispense: 90 tablet; Refill: 0  3. Hypothyroidism, unspecified type Continues on synthroid 88 mcg daily, TSH at goal on recent labs  4. Chronic midline low back pain without sciatica Improved significantly, still uses tylenol daily.   5. Gastroesophageal reflux disease without esophagitis Improved not needing medication at this time.   6. Nocturnal leg cramps Improved. Has stopped Requip, without Restless legs or cramps.   7. Encounter for immunization - Flu vaccine HIGH DOSE PF  Next appt: 2 weeks for HTN Shaquille Janes K. Gordon, [facility] [phone]

## 2017-10-20 ENCOUNTER — Telehealth: Payer: Self-pay | Admitting: *Deleted

## 2017-10-20 NOTE — Telephone Encounter (Signed)
Most likely coming from aricept, would recommend taking in the morning instead of evening.

## 2017-10-20 NOTE — Telephone Encounter (Signed)
Patient notified and agreed.  

## 2017-10-20 NOTE — Telephone Encounter (Signed)
Patient called and stated that she is having concerns with "deep Sleeping". Stated that she is having vivid dreams and Hallucinations. Stated that it is everyday and it is not long after she takes her night time medications.  Patient is wondering if there is a medication for this. Please Advise.

## 2017-10-25 ENCOUNTER — Encounter (HOSPITAL_COMMUNITY): Payer: Self-pay | Admitting: Emergency Medicine

## 2017-10-25 ENCOUNTER — Emergency Department (HOSPITAL_COMMUNITY)
Admission: EM | Admit: 2017-10-25 | Discharge: 2017-10-25 | Disposition: A | Discharge status: Home / Self Care | Payer: Medicare Other | Attending: Emergency Medicine | Admitting: Emergency Medicine

## 2017-10-25 ENCOUNTER — Other Ambulatory Visit: Payer: Self-pay

## 2017-10-25 DIAGNOSIS — Z853 Personal history of malignant neoplasm of breast: Secondary | ICD-10-CM | POA: Insufficient documentation

## 2017-10-25 DIAGNOSIS — R51 Headache: Secondary | ICD-10-CM | POA: Diagnosis present

## 2017-10-25 DIAGNOSIS — Z87891 Personal history of nicotine dependence: Secondary | ICD-10-CM | POA: Insufficient documentation

## 2017-10-25 DIAGNOSIS — N289 Disorder of kidney and ureter, unspecified: Secondary | ICD-10-CM | POA: Diagnosis not present

## 2017-10-25 DIAGNOSIS — I10 Essential (primary) hypertension: Secondary | ICD-10-CM | POA: Diagnosis not present

## 2017-10-25 DIAGNOSIS — N183 Chronic kidney disease, stage 3 (moderate): Secondary | ICD-10-CM | POA: Diagnosis not present

## 2017-10-25 DIAGNOSIS — F0390 Unspecified dementia without behavioral disturbance: Secondary | ICD-10-CM | POA: Diagnosis not present

## 2017-10-25 DIAGNOSIS — F419 Anxiety disorder, unspecified: Secondary | ICD-10-CM | POA: Insufficient documentation

## 2017-10-25 DIAGNOSIS — F329 Major depressive disorder, single episode, unspecified: Secondary | ICD-10-CM | POA: Diagnosis not present

## 2017-10-25 DIAGNOSIS — I129 Hypertensive chronic kidney disease with stage 1 through stage 4 chronic kidney disease, or unspecified chronic kidney disease: Secondary | ICD-10-CM | POA: Diagnosis not present

## 2017-10-25 DIAGNOSIS — Z79899 Other long term (current) drug therapy: Secondary | ICD-10-CM | POA: Insufficient documentation

## 2017-10-25 DIAGNOSIS — Z7982 Long term (current) use of aspirin: Secondary | ICD-10-CM | POA: Insufficient documentation

## 2017-10-25 DIAGNOSIS — E039 Hypothyroidism, unspecified: Secondary | ICD-10-CM | POA: Insufficient documentation

## 2017-10-25 LAB — DIFFERENTIAL
Abs Immature Granulocytes: 0.01 10*3/uL (ref 0.00–0.07)
Basophils Absolute: 0 10*3/uL (ref 0.0–0.1)
Basophils Relative: 0 %
Eosinophils Absolute: 0.1 10*3/uL (ref 0.0–0.5)
Eosinophils Relative: 1 %
Immature Granulocytes: 0 %
Lymphocytes Relative: 47 %
Lymphs Abs: 2.9 10*3/uL (ref 0.7–4.0)
Monocytes Absolute: 0.8 10*3/uL (ref 0.1–1.0)
Monocytes Relative: 12 %
Neutro Abs: 2.6 10*3/uL (ref 1.7–7.7)
Neutrophils Relative %: 40 %

## 2017-10-25 LAB — CBC
HCT: 38.3 % (ref 36.0–46.0)
Hemoglobin: 12.1 g/dL (ref 12.0–15.0)
MCH: 29.4 pg (ref 26.0–34.0)
MCHC: 31.6 g/dL (ref 30.0–36.0)
MCV: 93.2 fL (ref 80.0–100.0)
Platelets: 263 10*3/uL (ref 150–400)
RBC: 4.11 MIL/uL (ref 3.87–5.11)
RDW: 13.3 % (ref 11.5–15.5)
WBC: 6.4 10*3/uL (ref 4.0–10.5)
nRBC: 0 % (ref 0.0–0.2)

## 2017-10-25 LAB — COMPREHENSIVE METABOLIC PANEL
ALT: 18 U/L (ref 0–44)
AST: 25 U/L (ref 15–41)
Albumin: 3.7 g/dL (ref 3.5–5.0)
Alkaline Phosphatase: 40 U/L (ref 38–126)
Anion gap: 8 (ref 5–15)
BUN: 30 mg/dL — ABNORMAL HIGH (ref 8–23)
CO2: 26 mmol/L (ref 22–32)
Calcium: 9.3 mg/dL (ref 8.9–10.3)
Chloride: 104 mmol/L (ref 98–111)
Creatinine, Ser: 2.26 mg/dL — ABNORMAL HIGH (ref 0.44–1.00)
GFR calc Af Amer: 22 mL/min — ABNORMAL LOW (ref >60)
GFR calc non Af Amer: 19 mL/min — ABNORMAL LOW (ref >60)
Glucose, Bld: 116 mg/dL — ABNORMAL HIGH (ref 70–99)
Potassium: 3.8 mmol/L (ref 3.5–5.1)
Sodium: 138 mmol/L (ref 135–145)
Total Bilirubin: 0.5 mg/dL (ref 0.3–1.2)
Total Protein: 7.8 g/dL (ref 6.5–8.1)

## 2017-10-25 LAB — I-STAT TROPONIN, ED: Troponin i, poc: 0 ng/mL (ref 0.00–0.08)

## 2017-10-25 MED ORDER — HYDRALAZINE HCL 20 MG/ML IJ SOLN: 5.0000 mg | Freq: Once | INTRAMUSCULAR | Status: DC

## 2017-10-25 MED ORDER — HYDRALAZINE HCL 25 MG PO TABS
25.0000 mg | ORAL_TABLET | Freq: Once | ORAL | Status: AC
  Administered 2017-10-25: 25 mg via ORAL
  Filled 2017-10-25: qty 1

## 2017-10-25 MED ORDER — HYDRALAZINE HCL 20 MG/ML IJ SOLN
10.0000 mg | Freq: Once | INTRAMUSCULAR | Status: AC
  Administered 2017-10-25: 10 mg via INTRAVENOUS
  Filled 2017-10-25: qty 1

## 2017-10-25 NOTE — Discharge Instructions (Addendum)
Continue your medications as prescribed including the new dose of hydralazine.  If your blood pressure is running high, you can take an extra hydralazine tablet with each dose if needed.  Schedule follow-up with your doctor in 1 week for a recheck.  Your kidney function was also slightly off today.  You need to follow-up with your kidney doctor in a week or so for a recheck.

## 2017-10-25 NOTE — ED Triage Notes (Addendum)
Reports dull headache x 30 min.  Took BP at home and it was elevated.  States intermittent headaches, high BP, and dizziness over the past 2-3 weeks.  Stopped taking Trazodone 2-3 days ago due to nightmares.  No neuro deficits noted on triage exam.

## 2017-10-25 NOTE — ED Provider Notes (Signed)
[facility] EMERGENCY DEPARTMENT Provider Note   CSN: [id number] Arrival date & time: 10/25/17  0109     History   Chief Complaint Chief Complaint  Patient presents with  . Hypertension  . Headache    HPI Regina Porter is a 83 y.o. female.  Patient presents to the emergency department for evaluation of headache and elevated blood pressure.  Patient reports that her blood pressure has been running high for several weeks.  She was seen by her primary care doctor and had her hydralazine dose increased.  When she noticed her headache tonight, blood pressure was elevated.  Blood pressure 207/60 in triage.  Headache is dull and throbbing, similar to headache she has had previously when her blood pressure is elevated.  She also reports that she had been on trazodone to help her sleep.  She has been waking up with night terrors, stopped taking the medication 2 or 3 nights ago.     Past Medical History:  Diagnosis Date  . Anemia   . Anterolisthesis    grade 1: L3-4  . Anxiety   . Arthritis   . Breast cancer ([place])   . Chronic kidney disease   . Dementia ([place])   . Depression   . Dyspnea    W/ PAIN   . Gastroesophageal reflux disease   . Headache    TO SEE NEURO MD  . Heartburn   . Hypertension   . Hypothyroidism   . Leg cramps   . Post-menopause bleeding   . Restless leg syndrome 10/07/2014    Patient Active Problem List   Diagnosis Date Noted  . Status post lumbar spinal fusion 08/26/2017  . Bilateral carotid artery stenosis 08/30/2016  . Chronic midline low back pain without sciatica 08/30/2016  . CKD (chronic kidney disease) stage 3, GFR 30-59 ml/min (HCC) 08/30/2016  . Palpitations 04/22/2016  . Left carotid bruit 04/22/2016  . Hypothyroidism   . Hypertension   . Heartburn   . Depression   . Dementia ([place])   . Breast cancer ([place])   . Muscle spasm of back 12/19/2014  . Urinary frequency 12/19/2014    Past Surgical History:  Procedure  Laterality Date  . BREAST SURGERY Left    Lumpectomy   . CATARACT EXTRACTION, BILATERAL    . hysteroscopy biopsy       OB History   None      Home Medications    Prior to Admission medications   Medication Sig Start Date End Date Taking? Authorizing Provider  acetaminophen (TYLENOL) 500 MG tablet Take 500 mg by mouth daily as needed.    [provider]  amLODipine (NORVASC) 10 MG tablet Take 1 tablet (10 mg total) by mouth daily. 07/04/17   [name], NP  aspirin EC 81 MG tablet Take 81 mg by mouth daily.    [provider]  bumetanide (BUMEX) 2 MG tablet Take 2 mg by mouth daily. 03/08/17   [provider]  busPIRone (BUSPAR) 7.5 MG tablet TAKE 1 TABLET BY MOUTH TWICE A DAY 09/08/17   [name], NP  Cod Liver Oil 1000 MG CAPS Take 1 capsule by mouth 2 (two) times daily.     [provider]  donepezil (ARICEPT) 5 MG tablet TAKE 1 TABLET (5 MG TOTAL) BY MOUTH AT BEDTIME. 10/06/17   [name], NP  furosemide (LASIX) 40 MG tablet TAKE 1 TABLET BY MOUTH EVERY DAY IN THE MORNING 05/24/17  [provider]  hydrALAZINE (APRESOLINE) 50 MG tablet Take 1 tablet (50 mg total) by mouth every 8 (eight) hours. 10/14/17   [name], NP  levothyroxine (SYNTHROID, LEVOTHROID) 88 MCG tablet Take 1 tablet (88 mcg total) by mouth daily. 07/07/17   [name], NP  losartan (COZAAR) 100 MG tablet TAKE 1 TABLET BY MOUTH EVERYDAY AT BEDTIME 07/29/17   [provider]  metoprolol succinate (TOPROL-XL) 100 MG 24 hr tablet Take 1 tablet (100 mg total) by mouth at bedtime. 07/04/17   [name], NP  ranitidine (ZANTAC) 150 MG tablet Take 150 mg by mouth 2 (two) times daily as needed for heartburn.     [provider]  tamoxifen (NOLVADEX) 20 MG tablet Take 20 mg by mouth daily. 03/10/17   [provider]  Tetrahydrozoline HCl (VISINE OP) Place 1 drop into both eyes daily as needed (dry eyes).     [provider]  traZODone (DESYREL) 50 MG tablet TAKE 1 TABLET (50 MG TOTAL) BY MOUTH AT BEDTIME. 07/10/17   [name], NP    Family History Family History  Problem Relation Age of Onset  . Hypertension Mother   . Arthritis Mother   . Heart disease Mother   . Hypertension Father   . Heart disease Father   . Cancer Brother   . Breast cancer Paternal Aunt     Social History Social History   Tobacco Use  . Smoking status: Former Smoker    Packs/day: 0.10    Years: 20.00    Pack years: 2.00  . Smokeless tobacco: Never Used  Substance Use Topics  . Alcohol use: No    Alcohol/week: 0.0 standard drinks  . Drug use: No     Allergies   Shellfish allergy and Neomy-bacit-polymyx-pramoxine   Review of Systems Review of Systems  Neurological: Positive for headaches.  Psychiatric/Behavioral: Positive for sleep disturbance.  All other systems reviewed and are negative.    Physical Exam Updated Vital Signs BP (!) 165/53   Pulse 77   Temp 98.1 F (36.7 C) (Oral)   Resp 14   SpO2 98%   Physical Exam  Constitutional: She is oriented to person, place, and time. She appears well-developed and well-nourished. No distress.  HENT:  Head: Normocephalic and atraumatic.  Right Ear: Hearing normal.  Left Ear: Hearing normal.  Nose: Nose normal.  Mouth/Throat: Oropharynx is clear and moist and mucous membranes are normal.  Eyes: Pupils are equal, round, and reactive to light. Conjunctivae and EOM are normal.  Neck: Normal range of motion. Neck supple.  Cardiovascular: Regular rhythm, S1 normal and S2 normal. Exam reveals no gallop and no friction rub.  Murmur heard.  Systolic murmur is present. Pulmonary/Chest: Effort normal and breath sounds normal. No respiratory distress. She exhibits no tenderness.  Abdominal: Soft. Normal appearance and bowel sounds are normal. There is no hepatosplenomegaly. There is no tenderness. There is no rebound, no guarding, no  tenderness at McBurney's point and negative Murphy's sign. No hernia.  Musculoskeletal: Normal range of motion.  Neurological: She is alert and oriented to person, place, and time. She has normal strength. No cranial nerve deficit or sensory deficit. Coordination normal. GCS eye subscore is 4. GCS verbal subscore is 5. GCS motor subscore is 6.  Extraocular muscle movement: normal No visual field cut Pupils: equal and reactive both direct and consensual response is normal No nystagmus present    Sensory function is intact to light touch, pinprick  Proprioception intact  Grip strength 5/5 symmetric in upper extremities No pronator drift Normal finger to nose bilaterally     Skin: Skin is warm, dry and intact. No rash noted. No cyanosis.  Psychiatric: She has a normal mood and affect. Her speech is normal and behavior is normal. Thought content normal.  Nursing note and vitals reviewed.    ED Treatments / Results  Labs (all labs ordered are listed, but only abnormal results are displayed) Labs Reviewed  COMPREHENSIVE METABOLIC PANEL - Abnormal; Notable for the following components:      Result Value   Glucose, Bld 116 (*)    BUN 30 (*)    Creatinine, Ser 2.26 (*)    GFR calc non Af Amer 19 (*)    GFR calc Af Amer 22 (*)    All other components within normal limits  CBC  DIFFERENTIAL  I-STAT TROPONIN, ED    EKG EKG Interpretation  Date/Time:  Saturday October 25 2017 01:26:08 EDT Ventricular Rate:  69 PR Interval:  216 QRS Duration: 84 QT Interval:  420 QTC Calculation: 450 R Axis:   45 Text Interpretation:  Sinus rhythm with 1st degree A-V block Nonspecific T wave abnormality Abnormal ECG Confirmed by Orpah Greek [phone]) on 10/25/2017 1:42:44 AM   Radiology No results found.  Procedures Procedures (including critical care time)  Medications Ordered in ED Medications  hydrALAZINE (APRESOLINE) tablet 25 mg (25 mg Oral Given 10/25/17 0330)    hydrALAZINE (APRESOLINE) injection 10 mg (10 mg Intravenous Given 10/25/17 0450)     Initial Impression / Assessment and Plan / ED Course  I have reviewed the triage vital signs and the nursing notes.  Pertinent labs & imaging results that were available during my care of the patient were reviewed by me and considered in my medical decision making (see chart for details).     Patient presents with elevated blood pressure.  She has history of hypertension and her blood pressure has become difficult to manage over the last several weeks.  Her doctor increased her hydralazine from 25 mg twice a day to 50 mg 3 times a day.  She has only been on the new dosing for a couple of days, however.  Patient reported a dull headache which caused her to check her blood pressure and it was very elevated.  Final Clinical Impressions(s) / ED Diagnoses   Final diagnoses:  Essential hypertension  Renal insufficiency    ED Discharge Orders    None       Orpah Greek, MD 10/25/17 [phone]

## 2017-10-25 NOTE — ED Notes (Signed)
Pt reports elevated blood pressure and headache. Pt states this concerns her because she takes a lot of blood pressure medication.

## 2017-10-28 ENCOUNTER — Ambulatory Visit (INDEPENDENT_AMBULATORY_CARE_PROVIDER_SITE_OTHER): Payer: Medicare Other | Admitting: Nurse Practitioner

## 2017-10-28 ENCOUNTER — Encounter: Payer: Self-pay | Admitting: Nurse Practitioner

## 2017-10-28 VITALS — BP 156/62 | HR 69 | Temp 98.3°F | Ht 64.0 in | Wt 180.0 lb

## 2017-10-28 DIAGNOSIS — N183 Chronic kidney disease, stage 3 unspecified: Secondary | ICD-10-CM

## 2017-10-28 DIAGNOSIS — I6523 Occlusion and stenosis of bilateral carotid arteries: Secondary | ICD-10-CM | POA: Diagnosis not present

## 2017-10-28 DIAGNOSIS — G4720 Circadian rhythm sleep disorder, unspecified type: Secondary | ICD-10-CM

## 2017-10-28 DIAGNOSIS — F419 Anxiety disorder, unspecified: Secondary | ICD-10-CM

## 2017-10-28 DIAGNOSIS — I10 Essential (primary) hypertension: Secondary | ICD-10-CM

## 2017-10-28 MED ORDER — HYDRALAZINE HCL 50 MG PO TABS: 75.0000 mg | ORAL_TABLET | Freq: Three times a day (TID) | ORAL | 0 refills | Status: DC

## 2017-10-28 MED ORDER — BUSPIRONE HCL 10 MG PO TABS: 10.0000 mg | ORAL_TABLET | Freq: Two times a day (BID) | ORAL | 1 refills | Status: DC

## 2017-10-28 NOTE — Progress Notes (Signed)
Careteam: Patient Care Team: [name], NP as PCP - General (Geriatric Medicine) Jettie Booze, MD as PCP - Cardiology (Cardiology) Dustin Folks, MD as Consulting Physician (Hematology and Oncology) Esperanza Heir, MD as Consulting Physician (Obstetrics and Gynecology) Consuella Lose, MD as Consulting Physician (Neurosurgery) Deterding, Jeneen Rinks, MD as Consulting Physician (Nephrology)  Advanced Directive information    Allergies  Allergen Reactions  . Shellfish Allergy Swelling and Rash    MOUTH  . Neomy-Bacit-Polymyx-Pramoxine     UNSPECIFIED REACTION  Not sure which antibiotic she has a reaction to    Chief Complaint  Patient presents with  . Follow-up    2 week check on blood pressure     HPI: Patient is a 83 y.o. female seen in the office today to follow up hypertension.  Pt was seen 2 weeks ago for routine follow up and blood pressure noted to be elevated, therefore hydralazine was increased to 50 mg by mouth every 8 hours for blood pressure. This was added to norvasc 10 mg, cozaar 100 mg and metoprolol succinate 100 mg daily  She went to the ED on 10/25/17 due to elevated blood pressure and headache. She was instructed to take 1.5 tablets of hydralazine if blood pressure over 170 which is generally is in the morning. She is taking the 75 mg with improvement in blood pressure. Reports she took this dose this morning. She is off lasix but taking bumex. States surgery stopped this in hospital   Stopped trazodone, was waking up with night terrors.  Review of Systems:  Review of Systems  Constitutional: Negative for chills, fever, malaise/fatigue and weight loss.  HENT: Positive for hearing loss and tinnitus. Negative for congestion.   Eyes: Negative for blurred vision.  Respiratory: Negative for cough and shortness of breath.   Cardiovascular: Negative for chest pain, palpitations and leg swelling.  Gastrointestinal: Negative for abdominal  pain, blood in stool, constipation and melena.  Genitourinary: Negative for dysuria.  Musculoskeletal: Negative for back pain, falls and joint pain.  Skin: Negative for itching and rash.  Neurological: Positive for dizziness and headaches. Negative for loss of consciousness and weakness. Tremors:   Psychiatric/Behavioral: Positive for memory loss. Negative for depression. The patient is nervous/anxious and has insomnia (chronic sleep issues).     Past Medical History:  Diagnosis Date  . Anemia   . Anterolisthesis    grade 1: L3-4  . Anxiety   . Arthritis   . Breast cancer ([place])   . Chronic kidney disease   . Dementia ([place])   . Depression   . Dyspnea    W/ PAIN   . Gastroesophageal reflux disease   . Headache    TO SEE NEURO MD  . Heartburn   . Hypertension   . Hypothyroidism   . Leg cramps   . Post-menopause bleeding   . Restless leg syndrome 10/07/2014   Past Surgical History:  Procedure Laterality Date  . BREAST SURGERY Left    Lumpectomy   . CATARACT EXTRACTION, BILATERAL    . hysteroscopy biopsy     Social History:   reports that she has quit smoking. She has a 2.00 pack-year smoking history. She has never used smokeless tobacco. She reports that she does not drink alcohol or use drugs.  Family History  Problem Relation Age of Onset  . Hypertension Mother   . Arthritis Mother   . Heart disease Mother   . Hypertension Father   . Heart disease Father   .  Cancer Brother   . Breast cancer Paternal Aunt     Medications: Patient's Medications  New Prescriptions   No medications on file  Previous Medications   ACETAMINOPHEN (TYLENOL) 500 MG TABLET    Take 500 mg by mouth daily as needed.   AMLODIPINE (NORVASC) 10 MG TABLET    Take 1 tablet (10 mg total) by mouth daily.   ASPIRIN EC 81 MG TABLET    Take 81 mg by mouth daily.   BUMETANIDE (BUMEX) 2 MG TABLET    Take 2 mg by mouth daily.   BUSPIRONE (BUSPAR) 7.5 MG TABLET    TAKE 1 TABLET BY MOUTH TWICE A DAY    COD LIVER OIL 1000 MG CAPS    Take 1 capsule by mouth 2 (two) times daily.    DONEPEZIL (ARICEPT) 5 MG TABLET    TAKE 1 TABLET (5 MG TOTAL) BY MOUTH AT BEDTIME.   HYDRALAZINE (APRESOLINE) 50 MG TABLET    Take 1 tablet (50 mg total) by mouth every 8 (eight) hours.   LEVOTHYROXINE (SYNTHROID, LEVOTHROID) 88 MCG TABLET    Take 1 tablet (88 mcg total) by mouth daily.   LOSARTAN (COZAAR) 100 MG TABLET    TAKE 1 TABLET BY MOUTH EVERYDAY AT BEDTIME   METOPROLOL SUCCINATE (TOPROL-XL) 100 MG 24 HR TABLET    Take 1 tablet (100 mg total) by mouth at bedtime.   RANITIDINE (ZANTAC) 150 MG TABLET    Take 150 mg by mouth 2 (two) times daily as needed for heartburn.    TAMOXIFEN (NOLVADEX) 20 MG TABLET    Take 20 mg by mouth daily.   TETRAHYDROZOLINE HCL (VISINE OP)    Place 1 drop into both eyes daily as needed (dry eyes).  Modified Medications   No medications on file  Discontinued Medications   FUROSEMIDE (LASIX) 40 MG TABLET    TAKE 1 TABLET BY MOUTH EVERY DAY IN THE MORNING   TRAZODONE (DESYREL) 50 MG TABLET    TAKE 1 TABLET (50 MG TOTAL) BY MOUTH AT BEDTIME.     Physical Exam:  Vitals:   10/28/17 1451  BP: (!) 180/80  Pulse: 69  Temp: 98.3 F (36.8 C)  TempSrc: Oral  SpO2: 98%  Weight: 180 lb (81.6 kg)  Height: 5\' 4"  (1.626 m)   Body mass index is 30.9 kg/m.  Physical Exam  Constitutional: She appears well-developed and well-nourished. No distress.  HENT:  Head: Normocephalic and atraumatic.  Mouth/Throat: No oropharyngeal exudate.  Eyes: Pupils are equal, round, and reactive to light. Conjunctivae are normal.  Cardiovascular: Normal rate and regular rhythm.  Murmur heard. Pulmonary/Chest: Effort normal and breath sounds normal.  Abdominal: Soft. Bowel sounds are normal.  Musculoskeletal: She exhibits no edema or tenderness.  Neurological: She is alert.  Skin: Skin is warm and dry. She is not diaphoretic.  Psychiatric: She has a normal mood and affect.  Mild short term memory  loss    Labs reviewed: Basic Metabolic Panel: Recent Labs    01/22/17 1508  06/06/17 0456 07/04/17 1036 09/01/17 1335 10/25/17 0127  NA 136   < > 139 140  --  138  K 4.7   < > 4.5 4.2  --  3.8  CL 101   < > 107 104  --  104  CO2 27   < > 23 27  --  26  GLUCOSE 99   < > 129* 114*  --  116*  BUN 26*   < >  19 23  --  30*  CREATININE 2.23*   < > 1.67* 1.85*  --  2.26*  CALCIUM 9.5   < > 8.7* 9.2  --  9.3  TSH 1.93  --   --  0.15* 0.48  --    < > = values in this interval not displayed.   Liver Function Tests: Recent Labs    05/30/17 1451 07/04/17 1036 10/25/17 0127  AST 24 17 25   ALT 19 12 18   ALKPHOS 36*  --  40  BILITOT 0.5 0.3 0.5  PROT 7.7 7.1 7.8  ALBUMIN 3.8  --  3.7   No results for input(s): LIPASE, AMYLASE in the last 8760 hours. No results for input(s): AMMONIA in the last 8760 hours. CBC: Recent Labs    04/07/17 1206  06/05/17 0357 07/04/17 1036 10/25/17 0127  WBC 5.1   < > 10.1 4.8 6.4  NEUTROABS 2,647  --   --  2,443 2.6  HGB 11.7   < > 9.3* 9.9* 12.1  HCT 34.8*   < > 28.2* 29.2* 38.3  MCV 90.6   < > 91.6 90.1 93.2  PLT 281   < > 223 245 263   < > = values in this interval not displayed.   Lipid Panel: No results for input(s): CHOL, HDL, LDLCALC, TRIG, CHOLHDL, LDLDIRECT in the last 8760 hours. TSH: Recent Labs    01/22/17 1508 07/04/17 1036 09/01/17 1335  TSH 1.93 0.15* 0.48   A1C: No results found for: HGBA1C   Assessment/Plan 1. Essential hypertension -improved on recheck to 156/62. Will have pt increase hydralazine 75 mg every 8 hours along with other medications. She will verify she is taking all medications on her list once she gets home.  - hydrALAZINE (APRESOLINE) 50 MG tablet; Take 1.5 tablets (75 mg total) by mouth every 8 (eight) hours.  Dispense: 90 tablet; Refill: 0  2. CKD (chronic kidney disease) stage 3, GFR 30-59 ml/min (HCC) Elevated in ED, continues to follow with nephrology.   3. Sleep-wake cycle disorder -off  trazodone without adverse effect. Sleep is unchanged.  4. Anxiety Reports worsening anxiety which she feels like is contributing to elevated blood pressure. Will increase buspar to 10 mg (from 7.5mg ) twice daily at this time - busPIRone (BUSPAR) 10 MG tablet; Take 1 tablet (10 mg total) by mouth 2 (two) times daily.  Dispense: 60 tablet; Refill: 1  Next appt: 2 weeks with blood pressure reading Jakyrie Totherow K. [place], [facility] [phone]

## 2017-10-28 NOTE — Patient Instructions (Addendum)
Increase hydralazine to 1.5 mg by mouth every 8 hours  INCREASE BUSPAR TO 10 MG BY MOUTH TWICE DAILY   To record blood pressure when you take them- take on both arms  Follow up in 2 weeks

## 2017-10-31 ENCOUNTER — Telehealth: Payer: Self-pay | Admitting: *Deleted

## 2017-10-31 NOTE — Telephone Encounter (Signed)
Patient caregiver called and stated that patient needs a letter to send to [employer] to postpone her travel plans. Stated that she spoke with Janett Billow Tuesday at appointment regarding traveling and it was suggested to hold off due to BP not being regulated. Requesting letter to give to [place]. Please Advise.

## 2017-10-31 NOTE — Telephone Encounter (Signed)
Let has been printed and signed at this time. If she needs more specific details let me know. Thank you

## 2017-10-31 NOTE — Telephone Encounter (Signed)
[name] Notified. Left message on Voicemail that letter was ready for pick up and it would be up front with patient's name on it.

## 2017-11-03 ENCOUNTER — Other Ambulatory Visit: Payer: Self-pay

## 2017-11-03 DIAGNOSIS — I10 Essential (primary) hypertension: Secondary | ICD-10-CM

## 2017-11-03 MED ORDER — HYDRALAZINE HCL 50 MG PO TABS: 75.0000 mg | ORAL_TABLET | Freq: Three times a day (TID) | ORAL | 0 refills | Status: DC

## 2017-11-03 NOTE — Telephone Encounter (Signed)
Patient requesting refill for hydralazine. Patient stated it was increased on her last visit but pharmacy never received updated prescription.

## 2017-11-06 ENCOUNTER — Other Ambulatory Visit: Payer: Self-pay | Admitting: Nurse Practitioner

## 2017-11-06 DIAGNOSIS — I10 Essential (primary) hypertension: Secondary | ICD-10-CM

## 2017-11-19 ENCOUNTER — Other Ambulatory Visit: Payer: Self-pay | Admitting: Nurse Practitioner

## 2017-11-19 DIAGNOSIS — F419 Anxiety disorder, unspecified: Secondary | ICD-10-CM

## 2017-11-24 DIAGNOSIS — I129 Hypertensive chronic kidney disease with stage 1 through stage 4 chronic kidney disease, or unspecified chronic kidney disease: Secondary | ICD-10-CM | POA: Diagnosis not present

## 2017-11-24 DIAGNOSIS — N184 Chronic kidney disease, stage 4 (severe): Secondary | ICD-10-CM | POA: Diagnosis not present

## 2017-11-24 DIAGNOSIS — D631 Anemia in chronic kidney disease: Secondary | ICD-10-CM | POA: Diagnosis not present

## 2017-11-24 DIAGNOSIS — N2581 Secondary hyperparathyroidism of renal origin: Secondary | ICD-10-CM | POA: Diagnosis not present

## 2017-11-28 ENCOUNTER — Other Ambulatory Visit: Payer: Self-pay | Admitting: Nurse Practitioner

## 2017-11-30 ENCOUNTER — Other Ambulatory Visit: Payer: Self-pay | Admitting: Nurse Practitioner

## 2017-11-30 DIAGNOSIS — I10 Essential (primary) hypertension: Secondary | ICD-10-CM

## 2017-12-04 ENCOUNTER — Other Ambulatory Visit: Payer: Self-pay | Admitting: Nurse Practitioner

## 2017-12-04 DIAGNOSIS — I10 Essential (primary) hypertension: Secondary | ICD-10-CM

## 2017-12-07 ENCOUNTER — Other Ambulatory Visit: Payer: Self-pay | Admitting: Nurse Practitioner

## 2017-12-15 ENCOUNTER — Other Ambulatory Visit: Payer: Self-pay | Admitting: *Deleted

## 2017-12-15 DIAGNOSIS — I10 Essential (primary) hypertension: Secondary | ICD-10-CM

## 2017-12-15 MED ORDER — HYDRALAZINE HCL 50 MG PO TABS: 75.0000 mg | ORAL_TABLET | Freq: Three times a day (TID) | ORAL | 1 refills | Status: DC

## 2017-12-15 NOTE — Telephone Encounter (Signed)
[facility] [place] [place]

## 2017-12-16 ENCOUNTER — Other Ambulatory Visit: Payer: Self-pay | Admitting: Nurse Practitioner

## 2017-12-16 DIAGNOSIS — F419 Anxiety disorder, unspecified: Secondary | ICD-10-CM

## 2017-12-16 NOTE — Telephone Encounter (Signed)
I called patient/family member to set up appointment, I was told by daughter that she was currently at the airport and will return call later to schedule an appointment.  Regina Porter please advise if ok to refill medication  Last OV 10/28/17, Buspar was increased and patient was told to follow-up in 2 weeks(appointment never scheduled)

## 2017-12-24 ENCOUNTER — Encounter: Payer: Self-pay | Admitting: Nurse Practitioner

## 2017-12-24 ENCOUNTER — Ambulatory Visit (INDEPENDENT_AMBULATORY_CARE_PROVIDER_SITE_OTHER): Payer: Medicare Other | Admitting: Nurse Practitioner

## 2017-12-24 VITALS — BP 136/64 | HR 64 | Temp 97.9°F | Ht 64.0 in | Wt 184.0 lb

## 2017-12-24 DIAGNOSIS — G8929 Other chronic pain: Secondary | ICD-10-CM

## 2017-12-24 DIAGNOSIS — M545 Low back pain, unspecified: Secondary | ICD-10-CM

## 2017-12-24 DIAGNOSIS — I10 Essential (primary) hypertension: Secondary | ICD-10-CM

## 2017-12-24 DIAGNOSIS — F419 Anxiety disorder, unspecified: Secondary | ICD-10-CM | POA: Diagnosis not present

## 2017-12-24 DIAGNOSIS — H8110 Benign paroxysmal vertigo, unspecified ear: Secondary | ICD-10-CM | POA: Diagnosis not present

## 2017-12-24 MED ORDER — BUSPIRONE HCL 10 MG PO TABS: ORAL_TABLET | ORAL | 3 refills | Status: DC

## 2017-12-24 MED ORDER — HYDRALAZINE HCL 100 MG PO TABS: 100.0000 mg | ORAL_TABLET | Freq: Three times a day (TID) | ORAL | 0 refills | Status: DC

## 2017-12-24 NOTE — Patient Instructions (Addendum)
Can use meclizine 25 mg tablet take 1/2 tablet- 12.5 mg by mouth every 8 hours as needed for dizziness- this is OTC

## 2017-12-24 NOTE — Progress Notes (Signed)
Careteam: Patient Care Team: [name], NP as PCP - General (Geriatric Medicine) Jettie Booze, MD as PCP - Cardiology (Cardiology) Dustin Folks, MD as Consulting Physician (Hematology and Oncology) Esperanza Heir, MD as Consulting Physician (Obstetrics and Gynecology) Consuella Lose, MD as Consulting Physician (Neurosurgery) Deterding, Jeneen Rinks, MD as Consulting Physician (Nephrology)  Advanced Directive information Does Patient Have a Medical Advance Directive?: No  Allergies  Allergen Reactions  . Shellfish Allergy Swelling and Rash    MOUTH  . Neomy-Bacit-Polymyx-Pramoxine     UNSPECIFIED REACTION  Not sure which antibiotic she has a reaction to    Chief Complaint  Patient presents with  . Follow-up    Pt is being seen to follow up on bp and anxiety.      HPI: Patient is a 83 y.o. female seen in the office today for blood pressure follow up.  She has been seen several times due to elevated blood pressure. At last OV hydralazine was increased to 75 mg every 8 hours. She is also taking norvasc 10 mg, cozaar 100 mg and metoprolol succinate 100 mg daily. Blood pressure was elevated at nephrologist office and they increased hydralazine to 100 mg q 8 hours.   Anxiety- buspar was increased from 7.5 to 10 mg twice daily. Anxiety is much better on increase dose.   Reports diarrhea- worse with coffee- once she stopped coffee then diarrhea stops.   Had spinal fusion in May due to lumbar stenosis- now feeling like her back is very weak and sore. Did not have PT after surgery and feels like this contributed.     Review of Systems:  Review of Systems  Constitutional: Negative for chills, fever, malaise/fatigue and weight loss.  HENT: Positive for hearing loss and tinnitus. Negative for congestion.   Eyes: Negative for blurred vision.  Respiratory: Negative for cough and shortness of breath.   Cardiovascular: Negative for chest pain, palpitations  and leg swelling.  Gastrointestinal: Negative for abdominal pain, blood in stool, constipation, diarrhea and melena.  Genitourinary: Negative for dysuria.  Musculoskeletal: Negative for back pain, falls and joint pain.  Skin: Negative for itching and rash.  Neurological: Positive for dizziness and headaches. Negative for loss of consciousness and weakness. Tremors:   Psychiatric/Behavioral: Positive for memory loss. Negative for depression. The patient is nervous/anxious and has insomnia (chronic sleep issues).     Past Medical History:  Diagnosis Date  . Anemia   . Anterolisthesis    grade 1: L3-4  . Anxiety   . Arthritis   . Breast cancer ([place])   . Chronic kidney disease   . Dementia ([place])   . Depression   . Dyspnea    W/ PAIN   . Gastroesophageal reflux disease   . Headache    TO SEE NEURO MD  . Heartburn   . Hypertension   . Hypothyroidism   . Leg cramps   . Post-menopause bleeding   . Restless leg syndrome 10/07/2014   Past Surgical History:  Procedure Laterality Date  . BREAST SURGERY Left    Lumpectomy   . CATARACT EXTRACTION, BILATERAL    . hysteroscopy biopsy     Social History:   reports that she has quit smoking. She has a 2.00 pack-year smoking history. She has never used smokeless tobacco. She reports that she does not drink alcohol or use drugs.  Family History  Problem Relation Age of Onset  . Hypertension Mother   . Arthritis Mother   .  Heart disease Mother   . Hypertension Father   . Heart disease Father   . Cancer Brother   . Breast cancer Paternal Aunt     Medications: Patient's Medications  New Prescriptions   No medications on file  Previous Medications   ACETAMINOPHEN (TYLENOL) 500 MG TABLET    Take 500 mg by mouth daily as needed.   AMLODIPINE (NORVASC) 10 MG TABLET    Take 1 tablet (10 mg total) by mouth daily.   ASPIRIN EC 81 MG TABLET    Take 81 mg by mouth daily.   BUMETANIDE (BUMEX) 2 MG TABLET    Take 2 mg by mouth daily.    BUSPIRONE (BUSPAR) 10 MG TABLET    TAKE 1 TABLET BY MOUTH TWICE DAILY .DX F41.9 APPT IS OVERDUE   COD LIVER OIL 1000 MG CAPS    Take 1 capsule by mouth 2 (two) times daily.    DONEPEZIL (ARICEPT) 5 MG TABLET    TAKE 1 TABLET (5 MG TOTAL) BY MOUTH AT BEDTIME.   HYDRALAZINE (APRESOLINE) 50 MG TABLET    Take 1.5 tablets (75 mg total) by mouth every 8 (eight) hours.   LEVOTHYROXINE (SYNTHROID, LEVOTHROID) 88 MCG TABLET    Take 1 tablet (88 mcg total) by mouth daily.   LOSARTAN (COZAAR) 100 MG TABLET    TAKE 1 TABLET BY MOUTH EVERYDAY AT BEDTIME   METOPROLOL SUCCINATE (TOPROL-XL) 100 MG 24 HR TABLET    Take 1 tablet (100 mg total) by mouth at bedtime.   TAMOXIFEN (NOLVADEX) 20 MG TABLET    Take 20 mg by mouth daily.   TETRAHYDROZOLINE HCL (VISINE OP)    Place 1 drop into both eyes daily as needed (dry eyes).  Modified Medications   No medications on file  Discontinued Medications   RANITIDINE (ZANTAC) 150 MG TABLET    Take 150 mg by mouth 2 (two) times daily as needed for heartburn.      Physical Exam:  Vitals:   12/24/17 1306 12/24/17 1340  BP: (!) 148/76 136/64  Pulse: 64   Temp: 97.9 F (36.6 C)   TempSrc: Oral   SpO2: 96%   Weight: 184 lb (83.5 kg)   Height: 5\' 4"  (1.626 m)    Body mass index is 31.58 kg/m.  Physical Exam Constitutional:      General: She is not in acute distress.    Appearance: She is well-developed. She is not diaphoretic.  HENT:     Head: Normocephalic and atraumatic.     Right Ear: Tympanic membrane, ear canal and external ear normal.     Left Ear: Tympanic membrane, ear canal and external ear normal.     Nose: Nose normal.     Mouth/Throat:     Mouth: Mucous membranes are dry.     Pharynx: No oropharyngeal exudate.  Eyes:     Extraocular Movements: Extraocular movements intact.     Conjunctiva/sclera: Conjunctivae normal.     Pupils: Pupils are equal, round, and reactive to light.  Cardiovascular:     Rate and Rhythm: Normal rate and regular  rhythm.     Heart sounds: Murmur present.  Pulmonary:     Effort: Pulmonary effort is normal.     Breath sounds: Normal breath sounds.  Abdominal:     General: Bowel sounds are normal.     Palpations: Abdomen is soft.  Musculoskeletal:        General: Tenderness (along lumbar spine) present.  Skin:  General: Skin is warm and dry.  Neurological:     Mental Status: She is alert.  Psychiatric:     Comments: Mild short term memory loss     Labs reviewed: Basic Metabolic Panel: Recent Labs    01/22/17 1508  06/06/17 0456 07/04/17 1036 09/01/17 1335 10/25/17 0127  NA 136   < > 139 140  --  138  K 4.7   < > 4.5 4.2  --  3.8  CL 101   < > 107 104  --  104  CO2 27   < > 23 27  --  26  GLUCOSE 99   < > 129* 114*  --  116*  BUN 26*   < > 19 23  --  30*  CREATININE 2.23*   < > 1.67* 1.85*  --  2.26*  CALCIUM 9.5   < > 8.7* 9.2  --  9.3  TSH 1.93  --   --  0.15* 0.48  --    < > = values in this interval not displayed.   Liver Function Tests: Recent Labs    05/30/17 1451 07/04/17 1036 10/25/17 0127  AST 24 17 25   ALT 19 12 18   ALKPHOS 36*  --  40  BILITOT 0.5 0.3 0.5  PROT 7.7 7.1 7.8  ALBUMIN 3.8  --  3.7   No results for input(s): LIPASE, AMYLASE in the last 8760 hours. No results for input(s): AMMONIA in the last 8760 hours. CBC: Recent Labs    04/07/17 1206  06/05/17 0357 07/04/17 1036 10/25/17 0127  WBC 5.1   < > 10.1 4.8 6.4  NEUTROABS 2,647  --   --  2,443 2.6  HGB 11.7   < > 9.3* 9.9* 12.1  HCT 34.8*   < > 28.2* 29.2* 38.3  MCV 90.6   < > 91.6 90.1 93.2  PLT 281   < > 223 245 263   < > = values in this interval not displayed.   Lipid Panel: No results for input(s): CHOL, HDL, LDLCALC, TRIG, CHOLHDL, LDLDIRECT in the last 8760 hours. TSH: Recent Labs    01/22/17 1508 07/04/17 1036 09/01/17 1335  TSH 1.93 0.15* 0.48   A1C: No results found for: HGBA1C   Assessment/Plan 1. Essential hypertension -improved on recheck, will continue current  regimen with low sodium diet.  - hydrALAZINE (APRESOLINE) 100 MG tablet; Take 1 tablet (100 mg total) by mouth 3 (three) times daily.  Dispense: 90 tablet; Refill: 0  2. Chronic midline low back pain without sciatica -reports weakness to back, tenderness on exam. Will get PT referral for further evaluation and treatment at thist ime - Ambulatory referral to Physical Therapy  3. Anxiety -improved with increase in buspar, no adverse effects noted  - busPIRone (BUSPAR) 10 MG tablet; TAKE 1 TABLET BY MOUTH TWICE DAILY .DX F41.9 APPT IS OVERDUE  Dispense: 60 tablet; Refill: 3  4. Benign paroxysmal positional vertigo, unspecified laterality -to use meclizine 12.5-25 mg by mouth every 8 hours as needed  - Ambulatory referral to Physical Therapy  Next appt: 4 months, sooner if needed  Mattye Verdone K. [place], [facility] [phone]

## 2017-12-25 ENCOUNTER — Other Ambulatory Visit: Payer: Self-pay | Admitting: Nurse Practitioner

## 2017-12-31 ENCOUNTER — Other Ambulatory Visit: Payer: Self-pay | Admitting: Nurse Practitioner

## 2017-12-31 DIAGNOSIS — I10 Essential (primary) hypertension: Secondary | ICD-10-CM

## 2018-01-02 ENCOUNTER — Ambulatory Visit: Payer: Medicare Other | Attending: Nurse Practitioner | Admitting: Physical Therapy

## 2018-01-02 ENCOUNTER — Other Ambulatory Visit: Payer: Self-pay

## 2018-01-02 ENCOUNTER — Encounter: Payer: Self-pay | Admitting: Physical Therapy

## 2018-01-02 DIAGNOSIS — G8929 Other chronic pain: Secondary | ICD-10-CM | POA: Insufficient documentation

## 2018-01-02 DIAGNOSIS — R2681 Unsteadiness on feet: Secondary | ICD-10-CM | POA: Insufficient documentation

## 2018-01-02 DIAGNOSIS — M545 Low back pain, unspecified: Secondary | ICD-10-CM

## 2018-01-02 DIAGNOSIS — R42 Dizziness and giddiness: Secondary | ICD-10-CM | POA: Diagnosis not present

## 2018-01-02 DIAGNOSIS — R262 Difficulty in walking, not elsewhere classified: Secondary | ICD-10-CM | POA: Diagnosis not present

## 2018-01-02 DIAGNOSIS — R293 Abnormal posture: Secondary | ICD-10-CM | POA: Diagnosis not present

## 2018-01-02 DIAGNOSIS — M6281 Muscle weakness (generalized): Secondary | ICD-10-CM | POA: Insufficient documentation

## 2018-01-02 NOTE — Therapy (Signed)
[facility] [address] [place], [place], [address] Phone: [phone]   Fax:  [phone]  Physical Therapy Evaluation  Patient Details  Name: Regina Porter MRN: [id number] Date of Birth: [birth year] Referring Provider (PT): [name], NP   Encounter Date: 01/02/2018  PT End of Session - 01/02/18 0954    Visit Number  1    Number of Visits  17    Date for PT Re-Evaluation  03/13/18    Authorization Type  Medicare - 10th visit PN.  VL: medicare guidelines    PT Start Time  0800    PT Stop Time  0848    PT Time Calculation (min)  48 min    Activity Tolerance  Patient tolerated treatment well    Behavior During Therapy  WFL for tasks assessed/performed       Past Medical History:  Diagnosis Date  . Anemia   . Anterolisthesis    grade 1: L3-4  . Anxiety   . Arthritis   . Breast cancer ([place])   . Chronic kidney disease   . Dementia ([place])   . Depression   . Dyspnea    W/ PAIN   . Gastroesophageal reflux disease   . Headache    TO SEE NEURO MD  . Heartburn   . Hypertension   . Hypothyroidism   . Leg cramps   . Post-menopause bleeding   . Restless leg syndrome 10/07/2014    Past Surgical History:  Procedure Laterality Date  . BREAST SURGERY Left    Lumpectomy   . CATARACT EXTRACTION, BILATERAL    . hysteroscopy biopsy      There were no vitals filed for this visit.   Subjective Assessment - 01/02/18 0805    Subjective  Pt had surgery in May for lumbar spine; did not receive therapy.  Continues to have soreness, tenderness and weakness in low back.  Also feels tension in mid back. Denies radiating symptoms, numbness or tingling.  Denies falls.  Pt also reports intermittent spinning dizziness and loses control of her body, becomes weak.  Has been happening every day, every 15 minutes.  Happens spontaneously when she is lying down or sitting down.        Patient is accompained by:  Family member     Patient Stated Goals  To improve the strength in her back and relieve pain.  To improve dizziness.  To walk without cane.    Currently in Pain?  Yes   standing   Pain Score  9     Pain Location  Back    Pain Orientation  Lower    Pain Descriptors / Indicators  Sore;Tender    Pain Type  Chronic pain    Pain Onset  More than a month ago    Aggravating Factors   Standing    Pain Relieving Factors  sitting, heating pads         [facility] PT Assessment - 01/02/18 0814      Assessment   Medical Diagnosis  back pain, dizziness    Referring Provider (PT)  [name], NP    Onset Date/Surgical Date  12/24/17    Prior Therapy  yes at [facility]      Precautions   Precautions  Other (comment)    Precaution Comments  restless leg syndrome, hypothyroidism, HTN, HA, GERD, dyspnea, depression, dementia, CKD, OA, Breast CA with L lumpectomy, anxiety, anterolisthesis and stenosis s/p L3-L4 fusion  in May of 2019, and anemia      Balance Screen   Has the patient fallen in the past 6 months  No      Mutual residence    Living Arrangements  Children    Type of [place] to enter    Entrance Stairs-Number of Steps  4    Entrance Stairs-Rails  [place]  Two level;Bed/bath upstairs    Alternate Level Stairs-Number of Steps  12    Alternate Level Stairs-Rails  Right    [place] - single point      Prior Function   Level of Independence  Independent with household mobility with device;Independent with community mobility with device;Independent with transfers;Requires assistive device for independence    Leisure  walking      Observation/Other Assessments   Focus on Therapeutic Outcomes (FOTO)   Not assessed      Sensation   Light Touch  Appears Intact      Posture/Postural Control   Posture/Postural Control  Postural limitations    Postural Limitations  Rounded Shoulders;Increased lumbar  lordosis;Increased thoracic kyphosis;Anterior pelvic tilt;Weight shift right      ROM / Strength   AROM / PROM / Strength  Strength      Strength   Overall Strength  Within functional limits for tasks performed    Overall Strength Comments  bilat LE 5/5 overall, bilat glute med and glute max 4/5      Flexibility   Soft Tissue Assessment /Muscle Length  yes    Hamstrings  WFL to 90 deg    Piriformis  tenderness bilaterally    Quadratus Lumborum  tenderness bilaterally      Palpation   Palpation comment  tenderness to palpation R > L erector spinae muscles      Ambulation/Gait   Ambulation/Gait  Yes    Ambulation/Gait Assistance  5: Supervision    Ambulation Distance (Feet)  75 Feet    Assistive device  Straight cane    Gait Pattern  Step-through pattern;Decreased stride length;Decreased trunk rotation    Ambulation Surface  Level;Indoor      Standardized Balance Assessment   Standardized Balance Assessment  Berg Balance Test;Five Times Sit to Stand    Five times sit to stand comments   17.19 seconds from mat without UE support      Berg Balance Test   Sit to Stand  Able to stand without using hands and stabilize independently    Standing Unsupported  Able to stand safely 2 minutes    Sitting with Back Unsupported but Feet Supported on Floor or Stool  Able to sit safely and securely 2 minutes    Stand to Sit  Sits safely with minimal use of hands    Transfers  Able to transfer safely, minor use of hands    Standing Unsupported with Eyes Closed  Able to stand 10 seconds safely    Standing Ubsupported with Feet Together  Able to place feet together independently and stand 1 minute safely    From Standing, Reach Forward with Outstretched Arm  Can reach confidently >25 cm (10")    From Standing Position, Pick up Object from Floor  Able to pick up shoe, needs supervision    From Standing Position, Turn to Look Behind Over each Shoulder  Looks behind one side only/other side shows  less weight  shift    Turn 360 Degrees  Able to turn 360 degrees safely in 4 seconds or less    Standing Unsupported, Alternately Place Feet on Step/Stool  Able to complete 4 steps without aid or supervision    Standing Unsupported, One Foot in Front  Able to plae foot ahead of the other independently and hold 30 seconds    Standing on One Leg  Tries to lift leg/unable to hold 3 seconds but remains standing independently    Total Score  48    Berg comment:  48/56                Objective measurements completed on examination: See above findings.              PT Education - 01/02/18 0954    Education Details  clinical findings, PT POC and goals    Person(s) Educated  Patient;Child(ren)    Methods  Explanation    Comprehension  Verbalized understanding       PT Short Term Goals - 01/02/18 1001      PT SHORT TERM GOAL #1   Title  Pt will participate in and tolerate full vestibular assessment    Time  5    Period  Weeks    Status  New    Target Date  02/08/18      PT SHORT TERM GOAL #2   Title  Pt will demonstrate independence with initial strengthening/postural/balance/vestibular HEP    Time  5    Period  Weeks    Status  New    Target Date  02/08/18      PT SHORT TERM GOAL #3   Title  Pt will improve LE strength to perform five time sit to stand without UE support to </= 15 seconds    Baseline  17 seconds    Time  5    Period  Weeks    Status  New    Target Date  02/08/18      PT SHORT TERM GOAL #4   Title  Pt will decrease falls risk as indicated by increase in BERG to >/= 50/56    Baseline  48/56    Time  5    Period  Weeks    Status  New    Target Date  02/08/18      PT SHORT TERM GOAL #5   Title  Pt will report 25% reduction in back soreness during activities in standing/puzzles/reading    Baseline  9/10 in standing currently     Time  5    Period  Weeks    Status  New    Target Date  02/08/18        PT Long Term Goals - 01/02/18  1005      PT LONG TERM GOAL #1   Title  Pt will demonstrate independence with final HEP    Time  10    Period  Weeks    Status  New    Target Date  03/13/18      PT LONG TERM GOAL #2   Title  Pt will report 50% reduction in pain during standing activities/puzzles/reading    Time  10    Period  Weeks    Status  New    Target Date  03/13/18      PT LONG TERM GOAL #3   Title  Pt will demonstrate improved LE strength as indicated by five time sit to stand  without UE use to </= 13 seconds    Time  10    Period  Weeks    Status  New    Target Date  03/13/18      PT LONG TERM GOAL #4   Title  Pt will decrease falls risk as indicated by BERG score >/= 53/56    Time  10    Period  Weeks    Status  New    Target Date  03/13/18      PT LONG TERM GOAL #5   Title  Pt will ambulate x 230' indoors, 100' outside over pavement MOD I without use of cane and will negotiate 12 stairs with one rail MOD I    Time  10    Period  Weeks    Status  New    Target Date  03/13/18      Additional Long Term Goals   Additional Long Term Goals  Yes      PT LONG TERM GOAL #6   Title  Vestibular goal if indicated             Plan - 01/02/18 0957    Clinical Impression Statement  Pt is an 83 year old female referred to Neuro OPPT for evaluation of back pain and dizziness.  Pt's PMH is significant for the following: restless leg syndrome, hypothyroidism, HTN, HA, GERD, dyspnea, depression, dementia, CKD, OA, Breast CA with L lumpectomy, anxiety, anterolisthesis and stenosis s/p L3-L4 fusion in May of 2019, and anemia. The following deficits were noted during pt's exam: mild proximal hip weakness, difficulty with walking, impaired posture, active trigger points in lumbar spine and posterior hips, impaired balance and dizziness.  Pt's five time sit to stand and BERG scores indicate pt is at increased risk for falls. Pt would benefit from skilled PT to address these impairments and functional limitations  to maximize functional mobility independence and reduce falls risk.    History and Personal Factors relevant to plan of care:  must ascend stairs to access bed/bathroom, likes to work on puzzles and read but bending down right now increases symptoms, has been less able to ambulate for exercise due to pain, restless leg syndrome, hypothyroidism, HTN, HA, GERD, dyspnea, depression, dementia, CKD, OA, Breast CA with L lumpectomy, anxiety, anterolisthesis and stenosis s/p L3-L4 fusion in May of 2019, and anemia    Clinical Presentation  Evolving    Clinical Presentation due to:  must ascend stairs to access bed/bathroom, likes to work on puzzles and read but bending down right now increases symptoms, has been less able to ambulate for exercise due to pain, restless leg syndrome, hypothyroidism, HTN, HA, GERD, dyspnea, depression, dementia, CKD, OA, Breast CA with L lumpectomy, anxiety, anterolisthesis and stenosis s/p L3-L4 fusion in May of 2019, and anemia    Clinical Decision Making  Moderate    Rehab Potential  Good    PT Frequency  2x / week    PT Duration  Other (comment)   10 weeks   PT Treatment/Interventions  ADLs/Self Care Home Management;Moist Heat;Aquatic Therapy;Canalith Repostioning;Cryotherapy;DME Instruction;Gait training;Stair training;Functional mobility training;Therapeutic activities;Therapeutic exercise;Balance training;Neuromuscular re-education;Patient/family education;Manual techniques;Passive range of motion;Dry needling;Vestibular;Taping    PT Next Visit Plan  Assess vestibular.  Initiate core strengthening (flexion focused) and postural HEP (supine to begin).  Discuss possible aquatic and dry needling?    Recommended Other Services  Aquatic, dry needling    Consulted and Agree with Plan of Care  Patient;Family member/caregiver  Family Member Consulted  daughter       Patient will benefit from skilled therapeutic intervention in order to improve the following deficits and  impairments:  Decreased balance, Decreased strength, Dizziness, Difficulty walking, Postural dysfunction, Pain  Visit Diagnosis: Chronic midline low back pain without sciatica  Abnormal posture  Dizziness and giddiness  Unsteadiness on feet  Muscle weakness (generalized)  Difficulty in walking, not elsewhere classified     Problem List Patient Active Problem List   Diagnosis Date Noted  . Status post lumbar spinal fusion 08/26/2017  . Bilateral carotid artery stenosis 08/30/2016  . Chronic midline low back pain without sciatica 08/30/2016  . CKD (chronic kidney disease) stage 3, GFR 30-59 ml/min (HCC) 08/30/2016  . Palpitations 04/22/2016  . Left carotid bruit 04/22/2016  . Hypothyroidism   . Hypertension   . Heartburn   . Depression   . Dementia ([place])   . Breast cancer ([place])   . Muscle spasm of back 12/19/2014  . Urinary frequency 12/19/2014    Rico Junker, PT, DPT 01/02/18    10:14 AM    [facility] [address] [place], [place], [address] Phone: [phone]   Fax:  [phone]  Name: SHADAYA MARSCHNER MRN: [id number] Date of Birth: 11/13/1934

## 2018-01-19 ENCOUNTER — Other Ambulatory Visit: Payer: Self-pay | Admitting: Nurse Practitioner

## 2018-01-19 DIAGNOSIS — F419 Anxiety disorder, unspecified: Secondary | ICD-10-CM

## 2018-01-20 ENCOUNTER — Other Ambulatory Visit: Payer: Self-pay | Admitting: Nurse Practitioner

## 2018-01-20 ENCOUNTER — Encounter

## 2018-01-20 DIAGNOSIS — I10 Essential (primary) hypertension: Secondary | ICD-10-CM

## 2018-01-21 ENCOUNTER — Encounter: Payer: Self-pay | Admitting: Physical Therapy

## 2018-01-21 ENCOUNTER — Ambulatory Visit: Payer: Medicare Other | Attending: Nurse Practitioner | Admitting: Physical Therapy

## 2018-01-21 DIAGNOSIS — R293 Abnormal posture: Secondary | ICD-10-CM | POA: Insufficient documentation

## 2018-01-21 DIAGNOSIS — R262 Difficulty in walking, not elsewhere classified: Secondary | ICD-10-CM | POA: Diagnosis not present

## 2018-01-21 DIAGNOSIS — R2681 Unsteadiness on feet: Secondary | ICD-10-CM | POA: Insufficient documentation

## 2018-01-21 DIAGNOSIS — M545 Low back pain, unspecified: Secondary | ICD-10-CM

## 2018-01-21 DIAGNOSIS — G8929 Other chronic pain: Secondary | ICD-10-CM | POA: Insufficient documentation

## 2018-01-21 DIAGNOSIS — M6281 Muscle weakness (generalized): Secondary | ICD-10-CM | POA: Insufficient documentation

## 2018-01-21 NOTE — Therapy (Signed)
[facility] [address] [place], [place], [address] Phone: [phone]   Fax:  [phone]  Physical Therapy Treatment  Patient Details  Name: Regina Porter MRN: [id number] Date of Birth: 1934/05/25 Referring Provider (PT): [name], NP   Encounter Date: 01/21/2018  PT End of Session - 01/21/18 0909    Visit Number  2    Number of Visits  17    Date for PT Re-Evaluation  03/13/18    Authorization Type  Medicare - 10th visit PN.  VL: medicare guidelines    PT Start Time  0803    PT Stop Time  0849    PT Time Calculation (min)  46 min    Activity Tolerance  Patient tolerated treatment well    Behavior During Therapy  [facility] for tasks assessed/performed       Past Medical History:  Diagnosis Date  . Anemia   . Anterolisthesis    grade 1: L3-4  . Anxiety   . Arthritis   . Breast cancer ([place])   . Chronic kidney disease   . Dementia ([place])   . Depression   . Dyspnea    W/ PAIN   . Gastroesophageal reflux disease   . Headache    TO SEE NEURO MD  . Heartburn   . Hypertension   . Hypothyroidism   . Leg cramps   . Post-menopause bleeding   . Restless leg syndrome 10/07/2014    Past Surgical History:  Procedure Laterality Date  . BREAST SURGERY Left    Lumpectomy   . CATARACT EXTRACTION, BILATERAL    . hysteroscopy biopsy      There were no vitals filed for this visit.  Subjective Assessment - 01/21/18 0805    Subjective  Pt states everything is about the same.  Still having pain/soreness in her low back.  Dizziness is better - has not had any episodes since eval.    Patient is accompained by:  Family member    Patient Stated Goals  To improve the strength in her back and relieve pain.  To improve dizziness.  To walk without cane.    Currently in Pain?  Yes    Pain Score  9     Pain Location  Back    Pain Orientation  Lower    Pain Descriptors / Indicators  Sore    Pain Onset  More than a month  ago          Access Code: [email or web address]  URL: [email or web address]  Date: 01/21/2018  Prepared by: Misty Stanley   Exercises  Supine Figure 4 Piriformis Stretch - 2 reps - 30 second hold - 2x daily - 7x weekly  Supine Bridge with Mini Swiss Ball Between Knees - 10 reps - 1 sets - 2-3 second hold - 2x daily - 5x weekly  Beginner Reverse Clamshell - 10 reps - 2 sets - 2-3 second hold - 2x daily - 5x weekly  Standing Hip Extension with Counter Support - 10 reps - 2 sets - 2x daily - 5x weekly  Tandem Stance in Corner - 2 reps - 2 sets - 10 hold - 2x daily - 5x weekly  Supine Lower Trunk Rotation - 10 reps - 1 sets - 2x daily - 7x weekly        PT Education - 01/21/18 0908    Education Details  initiated strengthening and balance HEP    Person(s) Educated  Patient  Methods  Explanation;Demonstration;Handout    Comprehension  Verbalized understanding;Returned demonstration       PT Short Term Goals - 01/02/18 1001      PT SHORT TERM GOAL #1   Title  Pt will participate in and tolerate full vestibular assessment    Time  5    Period  Weeks    Status  New    Target Date  02/08/18      PT SHORT TERM GOAL #2   Title  Pt will demonstrate independence with initial strengthening/postural/balance/vestibular HEP    Time  5    Period  Weeks    Status  New    Target Date  02/08/18      PT SHORT TERM GOAL #3   Title  Pt will improve LE strength to perform five time sit to stand without UE support to </= 15 seconds    Baseline  17 seconds    Time  5    Period  Weeks    Status  New    Target Date  02/08/18      PT SHORT TERM GOAL #4   Title  Pt will decrease falls risk as indicated by increase in BERG to >/= 50/56    Baseline  48/56    Time  5    Period  Weeks    Status  New    Target Date  02/08/18      PT SHORT TERM GOAL #5   Title  Pt will report 25% reduction in back soreness during activities in standing/puzzles/reading    Baseline  9/10 in standing  currently     Time  5    Period  Weeks    Status  New    Target Date  02/08/18        PT Long Term Goals - 01/02/18 1005      PT LONG TERM GOAL #1   Title  Pt will demonstrate independence with final HEP    Time  10    Period  Weeks    Status  New    Target Date  03/13/18      PT LONG TERM GOAL #2   Title  Pt will report 50% reduction in pain during standing activities/puzzles/reading    Time  10    Period  Weeks    Status  New    Target Date  03/13/18      PT LONG TERM GOAL #3   Title  Pt will demonstrate improved LE strength as indicated by five time sit to stand without UE use to </= 13 seconds    Time  10    Period  Weeks    Status  New    Target Date  03/13/18      PT LONG TERM GOAL #4   Title  Pt will decrease falls risk as indicated by BERG score >/= 53/56    Time  10    Period  Weeks    Status  New    Target Date  03/13/18      PT LONG TERM GOAL #5   Title  Pt will ambulate x 230' indoors, 100' outside over pavement MOD I without use of cane and will negotiate 12 stairs with one rail MOD I    Time  10    Period  Weeks    Status  New    Target Date  03/13/18      Additional Long Term Goals  Additional Long Term Goals  Yes      PT LONG TERM GOAL #6   Title  Vestibular goal if indicated            Plan - 01/21/18 0909    Clinical Impression Statement  Due to pt reporting that she has not experienced any symptoms of dizziness over past 2 weeks, did not perform vestibular assessment today.  Will assess if symptoms return.  Focused treatment session on initiation of core and LE strengthening, ROM and balance HEP.  Pt tolerated well with no increase in pain.  Will continue to progress towards LTG.    Rehab Potential  Good    PT Frequency  2x / week    PT Duration  Other (comment)   10 weeks   PT Treatment/Interventions  ADLs/Self Care Home Management;Moist Heat;Aquatic Therapy;Canalith Repostioning;Cryotherapy;DME Instruction;Gait training;Stair  training;Functional mobility training;Therapeutic activities;Therapeutic exercise;Balance training;Neuromuscular re-education;Patient/family education;Manual techniques;Passive range of motion;Dry needling;Vestibular;Taping    PT Next Visit Plan  Assess vestibular IF dizziness returns.  How is she tolerating HEP?  continue to address hip and low back ROM/strengthening/balance.  Walking without cane. Initiate core strengthening (flexion focused) and postural HEP (supine to begin).  Sit to stand without UE support.  Increase standing tolerance (for puzzles). Discuss possible aquatic and dry needling?    Consulted and Agree with Plan of Care  Patient;Family member/caregiver    Family Member Consulted  daughter       Patient will benefit from skilled therapeutic intervention in order to improve the following deficits and impairments:  Decreased balance, Decreased strength, Dizziness, Difficulty walking, Postural dysfunction, Pain  Visit Diagnosis: Chronic midline low back pain without sciatica  Abnormal posture  Unsteadiness on feet  Muscle weakness (generalized)  Difficulty in walking, not elsewhere classified     Problem List Patient Active Problem List   Diagnosis Date Noted  . Status post lumbar spinal fusion 08/26/2017  . Bilateral carotid artery stenosis 08/30/2016  . Chronic midline low back pain without sciatica 08/30/2016  . CKD (chronic kidney disease) stage 3, GFR 30-59 ml/min (HCC) 08/30/2016  . Palpitations 04/22/2016  . Left carotid bruit 04/22/2016  . Hypothyroidism   . Hypertension   . Heartburn   . Depression   . Dementia ([place])   . Breast cancer ([place])   . Muscle spasm of back 12/19/2014  . Urinary frequency 12/19/2014    Rico Junker, PT, DPT 01/21/18    9:13 AM    [facility] [address] [place], [place], [address] Phone: [phone]   Fax:  [phone]  Name: IVON ROEDEL MRN:  [id number] Date of Birth: 20-Feb-1934

## 2018-01-21 NOTE — Patient Instructions (Signed)
Access Code: [email or web address]  URL: [email or web address]  Date: 01/21/2018  Prepared by: Misty Stanley   Exercises  Supine Figure 4 Piriformis Stretch - 2 reps - 30 second hold - 2x daily - 7x weekly  Supine Bridge with Mini Swiss Ball Between Knees - 10 reps - 1 sets - 2-3 second hold - 2x daily - 5x weekly  Beginner Reverse Clamshell - 10 reps - 2 sets - 2-3 second hold - 2x daily - 5x weekly  Standing Hip Extension with Counter Support - 10 reps - 2 sets - 2x daily - 5x weekly  Tandem Stance in Corner - 2 reps - 2 sets - 10 hold - 2x daily - 5x weekly  Supine Lower Trunk Rotation - 10 reps - 1 sets - 2x daily - 7x weekly

## 2018-01-22 ENCOUNTER — Ambulatory Visit: Payer: Medicare Other | Admitting: Physical Therapy

## 2018-01-23 ENCOUNTER — Ambulatory Visit: Payer: Medicare Other | Admitting: Physical Therapy

## 2018-01-28 ENCOUNTER — Telehealth: Payer: Self-pay | Admitting: *Deleted

## 2018-01-28 ENCOUNTER — Ambulatory Visit: Payer: Medicare Other | Admitting: Physical Therapy

## 2018-01-28 ENCOUNTER — Encounter: Payer: Self-pay | Admitting: Physical Therapy

## 2018-01-28 VITALS — BP 170/80

## 2018-01-28 DIAGNOSIS — M545 Low back pain, unspecified: Secondary | ICD-10-CM

## 2018-01-28 DIAGNOSIS — R262 Difficulty in walking, not elsewhere classified: Secondary | ICD-10-CM

## 2018-01-28 DIAGNOSIS — M6281 Muscle weakness (generalized): Secondary | ICD-10-CM | POA: Diagnosis not present

## 2018-01-28 DIAGNOSIS — R293 Abnormal posture: Secondary | ICD-10-CM

## 2018-01-28 DIAGNOSIS — G8929 Other chronic pain: Secondary | ICD-10-CM | POA: Diagnosis not present

## 2018-01-28 DIAGNOSIS — R2681 Unsteadiness on feet: Secondary | ICD-10-CM | POA: Diagnosis not present

## 2018-01-28 NOTE — Telephone Encounter (Signed)
Patient called and stated that she needed a refill on her Furosemide. Medication is not in current medication list. Medication was discontinued on 10/28/17 visit. Patient has still been taking them. Please Advise.

## 2018-01-28 NOTE — Telephone Encounter (Signed)
She should not be taking lasix, this was stopped by nephrologist.

## 2018-01-28 NOTE — Telephone Encounter (Signed)
Patient notified and agreed.  

## 2018-01-29 NOTE — Therapy (Signed)
[facility] [address] [place], [place], [address] Phone: [phone]   Fax:  [phone]  Physical Therapy Treatment  Patient Details  Name: Regina Porter MRN: [id number] Date of Birth: 1934/11/10 Referring Provider (PT): [name], NP   Encounter Date: 01/28/2018  PT End of Session - 01/28/18 1157    Visit Number  3    Number of Visits  17    Date for PT Re-Evaluation  03/13/18    Authorization Type  Medicare - 10th visit PN.  VL: medicare guidelines    PT Start Time  1149    PT Stop Time  1228    PT Time Calculation (min)  39 min    Activity Tolerance  Patient tolerated treatment well    Behavior During Therapy  WFL for tasks assessed/performed       Past Medical History:  Diagnosis Date  . Anemia   . Anterolisthesis    grade 1: L3-4  . Anxiety   . Arthritis   . Breast cancer ([place])   . Chronic kidney disease   . Dementia ([place])   . Depression   . Dyspnea    W/ PAIN   . Gastroesophageal reflux disease   . Headache    TO SEE NEURO MD  . Heartburn   . Hypertension   . Hypothyroidism   . Leg cramps   . Post-menopause bleeding   . Restless leg syndrome 10/07/2014    Past Surgical History:  Procedure Laterality Date  . BREAST SURGERY Left    Lumpectomy   . CATARACT EXTRACTION, BILATERAL    . hysteroscopy biopsy      Vitals:   01/28/18 1153  BP: (!) 170/80    Subjective Assessment - 01/28/18 1155    Subjective  Has noticed that when she gets dizzy, her BP is up, otherwise no more dizziness. Reports her back is also better, that the exercises/stretches are helping.     Patient Stated Goals  To improve the strength in her back and relieve pain.  To improve dizziness.  To walk without cane.    Currently in Pain?  Yes    Pain Score  6     Pain Location  Back    Pain Orientation  Lower    Pain Descriptors / Indicators  Sore    Pain Type  Chronic pain    Pain Onset  More than a month ago     Pain Frequency  Constant    Aggravating Factors   prolonged standing, standing in one place    Pain Relieving Factors  sitting, heating pads,               [facility] Adult PT Treatment/Exercise - 01/28/18 1210      Neuro Re-ed    Neuro Re-ed Details   on airex with red band: rows with 5 sec hold x 10 reps, then shoulder extension with 5 sec holds x 10 reps. cues on abd bracing and ex form/technique.       Exercises   Exercises  Other Exercises    Other Exercises   reviewd ex's issued to HEP last session with pt performing up to 5 reps of each          Balance Exercises - 01/28/18 1212      Balance Exercises: Standing   Standing Eyes Closed  Narrow base of support (BOS);Wide (BOA);Head turns;Foam/compliant surface;Other reps (comment);30 secs;Limitations      Balance Exercises:  Standing   Standing Eyes Closed Limitations  on pillow/s in corner with chair in front: narrow base of support for EC no head movements, progressing to wide base of support for EC head movements left<>right, up<>down and diagonals both ways. min guard to min assist for balance with cues on posture and weight shifting to assist with balance.           PT Short Term Goals - 01/02/18 1001      PT SHORT TERM GOAL #1   Title  Pt will participate in and tolerate full vestibular assessment    Time  5    Period  Weeks    Status  New    Target Date  02/08/18      PT SHORT TERM GOAL #2   Title  Pt will demonstrate independence with initial strengthening/postural/balance/vestibular HEP    Time  5    Period  Weeks    Status  New    Target Date  02/08/18      PT SHORT TERM GOAL #3   Title  Pt will improve LE strength to perform five time sit to stand without UE support to </= 15 seconds    Baseline  17 seconds    Time  5    Period  Weeks    Status  New    Target Date  02/08/18      PT SHORT TERM GOAL #4   Title  Pt will decrease falls risk as indicated by increase in BERG to >/= 50/56     Baseline  48/56    Time  5    Period  Weeks    Status  New    Target Date  02/08/18      PT SHORT TERM GOAL #5   Title  Pt will report 25% reduction in back soreness during activities in standing/puzzles/reading    Baseline  9/10 in standing currently     Time  5    Period  Weeks    Status  New    Target Date  02/08/18        PT Long Term Goals - 01/02/18 1005      PT LONG TERM GOAL #1   Title  Pt will demonstrate independence with final HEP    Time  10    Period  Weeks    Status  New    Target Date  03/13/18      PT LONG TERM GOAL #2   Title  Pt will report 50% reduction in pain during standing activities/puzzles/reading    Time  10    Period  Weeks    Status  New    Target Date  03/13/18      PT LONG TERM GOAL #3   Title  Pt will demonstrate improved LE strength as indicated by five time sit to stand without UE use to </= 13 seconds    Time  10    Period  Weeks    Status  New    Target Date  03/13/18      PT LONG TERM GOAL #4   Title  Pt will decrease falls risk as indicated by BERG score >/= 53/56    Time  10    Period  Weeks    Status  New    Target Date  03/13/18      PT LONG TERM GOAL #5   Title  Pt will ambulate x 230' indoors, 100' outside over  pavement MOD I without use of cane and will negotiate 12 stairs with one rail MOD I    Time  10    Period  Weeks    Status  New    Target Date  03/13/18      Additional Long Term Goals   Additional Long Term Goals  Yes      PT LONG TERM GOAL #6   Title  Vestibular goal if indicated            Plan - 01/28/18 1157    Clinical Impression Statement  Today's skilled session initially focused on review of strengthening HEP as pt reported she was not sure she was doing them correctly. Cues provided with pt performing correctly in session afterwards. Remainder of session continued to address strengthening and balance without any issues reported. The pt is progressing toward goals and should benefit from  continued PT to progress toward unmet goals.     Rehab Potential  Good    PT Frequency  2x / week    PT Duration  Other (comment)   10 weeks   PT Treatment/Interventions  ADLs/Self Care Home Management;Moist Heat;Aquatic Therapy;Canalith Repostioning;Cryotherapy;DME Instruction;Gait training;Stair training;Functional mobility training;Therapeutic activities;Therapeutic exercise;Balance training;Neuromuscular re-education;Patient/family education;Manual techniques;Passive range of motion;Dry needling;Vestibular;Taping    PT Next Visit Plan  Assess vestibular IF dizziness returns. continue to address hip and low back ROM/strengthening/balance.  Walking without cane.  Sit to stand without UE support.  Increase standing tolerance (for puzzles). Discuss possible aquatic and dry needling?    Consulted and Agree with Plan of Care  Patient       Patient will benefit from skilled therapeutic intervention in order to improve the following deficits and impairments:  Decreased balance, Decreased strength, Dizziness, Difficulty walking, Postural dysfunction, Pain  Visit Diagnosis: Chronic midline low back pain without sciatica  Abnormal posture  Unsteadiness on feet  Muscle weakness (generalized)  Difficulty in walking, not elsewhere classified     Problem List Patient Active Problem List   Diagnosis Date Noted  . Status post lumbar spinal fusion 08/26/2017  . Bilateral carotid artery stenosis 08/30/2016  . Chronic midline low back pain without sciatica 08/30/2016  . CKD (chronic kidney disease) stage 3, GFR 30-59 ml/min (HCC) 08/30/2016  . Palpitations 04/22/2016  . Left carotid bruit 04/22/2016  . Hypothyroidism   . Hypertension   . Heartburn   . Depression   . Dementia ([place])   . Breast cancer ([facility])   . Muscle spasm of back 12/19/2014  . Urinary frequency 12/19/2014    Willow Ora, PTA, [facility] [address], [place], [place]  [address] [phone] 01/29/18, 10:01 AM   Name: Regina Porter MRN: [id number] Date of Birth: November 01, 1934

## 2018-01-30 ENCOUNTER — Encounter: Payer: Self-pay | Admitting: Physical Therapy

## 2018-01-30 ENCOUNTER — Ambulatory Visit: Payer: Medicare Other | Admitting: Physical Therapy

## 2018-01-30 DIAGNOSIS — M6281 Muscle weakness (generalized): Secondary | ICD-10-CM

## 2018-01-30 DIAGNOSIS — R2681 Unsteadiness on feet: Secondary | ICD-10-CM | POA: Diagnosis not present

## 2018-01-30 DIAGNOSIS — R262 Difficulty in walking, not elsewhere classified: Secondary | ICD-10-CM

## 2018-01-30 DIAGNOSIS — R293 Abnormal posture: Secondary | ICD-10-CM

## 2018-01-30 DIAGNOSIS — G8929 Other chronic pain: Secondary | ICD-10-CM | POA: Diagnosis not present

## 2018-01-30 DIAGNOSIS — M545 Low back pain, unspecified: Secondary | ICD-10-CM

## 2018-01-30 NOTE — Therapy (Signed)
[facility] [address] [place], [place], [address] Phone: [phone]   Fax:  [phone]  Physical Therapy Treatment  Patient Details  Name: Regina Porter MRN: [id number] Date of Birth: [birth year] Referring Provider (PT): [name], NP   Encounter Date: 01/30/2018  PT End of Session - 01/30/18 2251    Visit Number  4    Number of Visits  17    Date for PT Re-Evaluation  03/13/18    Authorization Type  Medicare - 10th visit PN.  VL: medicare guidelines    PT Start Time  0800    PT Stop Time  0847    PT Time Calculation (min)  47 min    Activity Tolerance  Patient tolerated treatment well    Behavior During Therapy  WFL for tasks assessed/performed       Past Medical History:  Diagnosis Date  . Anemia   . Anterolisthesis    grade 1: L3-4  . Anxiety   . Arthritis   . Breast cancer ([place])   . Chronic kidney disease   . Dementia ([place])   . Depression   . Dyspnea    W/ PAIN   . Gastroesophageal reflux disease   . Headache    TO SEE NEURO MD  . Heartburn   . Hypertension   . Hypothyroidism   . Leg cramps   . Post-menopause bleeding   . Restless leg syndrome 10/07/2014    Past Surgical History:  Procedure Laterality Date  . BREAST SURGERY Left    Lumpectomy   . CATARACT EXTRACTION, BILATERAL    . hysteroscopy biopsy      There were no vitals filed for this visit.  Subjective Assessment - 01/30/18 0802    Subjective  Still not having dizziness.  Doing well with exercises and does not have any soreness afterwards.    Patient Stated Goals  To improve the strength in her back and relieve pain.  To improve dizziness.  To walk without cane.    Currently in Pain?  Yes    Pain Score  5     Pain Location  Back    Pain Orientation  Lower    Pain Descriptors / Indicators  Aching    Pain Type  Chronic pain    Pain Onset  More than a month ago    Aggravating Factors   changes in weather                        [facility] Adult PT Treatment/Exercise - 01/30/18 0805      Exercises   Exercises  Lumbar;Knee/Hip      Lumbar Exercises: Seated   Sit to Stand  10 reps    Sit to Stand Limitations  combined with breathing, inhale to stand, exhale and counting to 8 for controlled lowering      Lumbar Exercises: Supine   Ab Set  10 reps;Other (comment)   10 seconds with breathing, TA activation and LE fall outs   AB Set Limitations  focus on coordination of breathing and deep core activation; added LE fall outs      Knee/Hip Exercises: Standing   Hip Abduction  Stengthening;Right;Left;1 set;10 reps;Knee straight    Abduction Limitations  against resistance of green theraband with breathing    Hip Extension  Stengthening;Right;Left;1 set;10 reps;Knee straight    Extension Limitations  against resistance of green theraband with breathing  PT Education - 01/30/18 2250    Education Details  use of breathing and deep core activation for proximal stability prior to distal limb movement    Person(s) Educated  Patient    Methods  Explanation;Demonstration    Comprehension  Need further instruction;Verbal cues required;Tactile cues required       PT Short Term Goals - 01/02/18 1001      PT SHORT TERM GOAL #1   Title  Pt will participate in and tolerate full vestibular assessment    Time  5    Period  Weeks    Status  New    Target Date  02/08/18      PT SHORT TERM GOAL #2   Title  Pt will demonstrate independence with initial strengthening/postural/balance/vestibular HEP    Time  5    Period  Weeks    Status  New    Target Date  02/08/18      PT SHORT TERM GOAL #3   Title  Pt will improve LE strength to perform five time sit to stand without UE support to </= 15 seconds    Baseline  17 seconds    Time  5    Period  Weeks    Status  New    Target Date  02/08/18      PT SHORT TERM GOAL #4   Title  Pt will decrease falls risk as indicated by  increase in BERG to >/= 50/56    Baseline  48/56    Time  5    Period  Weeks    Status  New    Target Date  02/08/18      PT SHORT TERM GOAL #5   Title  Pt will report 25% reduction in back soreness during activities in standing/puzzles/reading    Baseline  9/10 in standing currently     Time  5    Period  Weeks    Status  New    Target Date  02/08/18        PT Long Term Goals - 01/02/18 1005      PT LONG TERM GOAL #1   Title  Pt will demonstrate independence with final HEP    Time  10    Period  Weeks    Status  New    Target Date  03/13/18      PT LONG TERM GOAL #2   Title  Pt will report 50% reduction in pain during standing activities/puzzles/reading    Time  10    Period  Weeks    Status  New    Target Date  03/13/18      PT LONG TERM GOAL #3   Title  Pt will demonstrate improved LE strength as indicated by five time sit to stand without UE use to </= 13 seconds    Time  10    Period  Weeks    Status  New    Target Date  03/13/18      PT LONG TERM GOAL #4   Title  Pt will decrease falls risk as indicated by BERG score >/= 53/56    Time  10    Period  Weeks    Status  New    Target Date  03/13/18      PT LONG TERM GOAL #5   Title  Pt will ambulate x 230' indoors, 100' outside over pavement MOD I without use of cane and will negotiate 12 stairs  with one rail MOD I    Time  10    Period  Weeks    Status  New    Target Date  03/13/18      Additional Long Term Goals   Additional Long Term Goals  Yes      PT LONG TERM GOAL #6   Title  Vestibular goal if indicated            Plan - 01/30/18 2251    Clinical Impression Statement  Treatment session focused on breathing and core activation prior to UE and LE movement beginning in supine and then progressing to standing.  Pt required max verbal and visual cues to sequence breathing and activation of core muscles.  Attempted to incorporate breathing and core activation into functional sit > stand and  standing LE strengthening exercises but pt continues to require cues to correctly sequence and to avoid valsalva maneuver.  Pt advised to continue to practice breathing and activation in supine at home but not to include LE movements.  Will continue to address and progress towards LTG.    Rehab Potential  Good    PT Frequency  2x / week    PT Duration  Other (comment)   10 weeks   PT Treatment/Interventions  ADLs/Self Care Home Management;Moist Heat;Aquatic Therapy;Canalith Repostioning;Cryotherapy;DME Instruction;Gait training;Stair training;Functional mobility training;Therapeutic activities;Therapeutic exercise;Balance training;Neuromuscular re-education;Patient/family education;Manual techniques;Passive range of motion;Dry needling;Vestibular;Taping    PT Next Visit Plan  continue to address breathing/core activation in supine, adding in LE movements once pt can sequence breathing/activation. hip (4 way) and low back ROM/strengthening/balance.  Walking without cane.  Stair Therapist, nutritional. Increase standing tolerance (for puzzles). Discuss possible aquatic and dry needling?    Consulted and Agree with Plan of Care  Patient       Patient will benefit from skilled therapeutic intervention in order to improve the following deficits and impairments:  Decreased balance, Decreased strength, Dizziness, Difficulty walking, Postural dysfunction, Pain  Visit Diagnosis: Chronic midline low back pain without sciatica  Difficulty in walking, not elsewhere classified  Abnormal posture  Unsteadiness on feet  Muscle weakness (generalized)     Problem List Patient Active Problem List   Diagnosis Date Noted  . Status post lumbar spinal fusion 08/26/2017  . Bilateral carotid artery stenosis 08/30/2016  . Chronic midline low back pain without sciatica 08/30/2016  . CKD (chronic kidney disease) stage 3, GFR 30-59 ml/min (HCC) 08/30/2016  . Palpitations 04/22/2016  . Left carotid bruit  04/22/2016  . Hypothyroidism   . Hypertension   . Heartburn   . Depression   . Dementia ([place])   . Breast cancer ([place])   . Muscle spasm of back 12/19/2014  . Urinary frequency 12/19/2014   Rico Junker, PT, DPT 01/30/18    11:02 PM    [facility] [address] [place], [place], [address] Phone: [phone]   Fax:  [phone]  Name: Regina Porter MRN: [id number] Date of Birth: 1934/01/30

## 2018-02-04 ENCOUNTER — Ambulatory Visit: Payer: Medicare Other | Admitting: Physical Therapy

## 2018-02-04 ENCOUNTER — Encounter: Payer: Self-pay | Admitting: Physical Therapy

## 2018-02-04 DIAGNOSIS — M545 Low back pain, unspecified: Secondary | ICD-10-CM

## 2018-02-04 DIAGNOSIS — M6281 Muscle weakness (generalized): Secondary | ICD-10-CM

## 2018-02-04 DIAGNOSIS — R262 Difficulty in walking, not elsewhere classified: Secondary | ICD-10-CM

## 2018-02-04 DIAGNOSIS — R2681 Unsteadiness on feet: Secondary | ICD-10-CM | POA: Diagnosis not present

## 2018-02-04 DIAGNOSIS — R293 Abnormal posture: Secondary | ICD-10-CM | POA: Diagnosis not present

## 2018-02-04 DIAGNOSIS — G8929 Other chronic pain: Secondary | ICD-10-CM | POA: Diagnosis not present

## 2018-02-05 NOTE — Therapy (Signed)
[facility] [address] [place], [place], [address] Phone: [phone]   Fax:  [phone]  Physical Therapy Treatment  Patient Details  Name: Regina Porter MRN: [id number] Date of Birth: July 19, 1934 Referring Provider (PT): [name], NP   Encounter Date: 02/04/2018  PT End of Session - 02/04/18 1150    Visit Number  5    Number of Visits  17    Date for PT Re-Evaluation  03/13/18    Authorization Type  Medicare - 10th visit PN.  VL: medicare guidelines    PT Start Time  1148    PT Stop Time  1230    PT Time Calculation (min)  42 min    Activity Tolerance  Patient tolerated treatment well;No increased pain;Patient limited by pain    Behavior During Therapy  [facility] for tasks assessed/performed       Past Medical History:  Diagnosis Date  . Anemia   . Anterolisthesis    grade 1: L3-4  . Anxiety   . Arthritis   . Breast cancer ([place])   . Chronic kidney disease   . Dementia ([place])   . Depression   . Dyspnea    W/ PAIN   . Gastroesophageal reflux disease   . Headache    TO SEE NEURO MD  . Heartburn   . Hypertension   . Hypothyroidism   . Leg cramps   . Post-menopause bleeding   . Restless leg syndrome 10/07/2014    Past Surgical History:  Procedure Laterality Date  . BREAST SURGERY Left    Lumpectomy   . CATARACT EXTRACTION, BILATERAL    . hysteroscopy biopsy      There were no vitals filed for this visit.  Subjective Assessment - 02/04/18 1149    Subjective  Reporting increaesd pain today. Feels it could be due to doing her ex's with the head and foot up on her bed (has an adjustable bed at home). No falls.     Patient is accompained by:  Family member    Patient Stated Goals  To improve the strength in her back and relieve pain.  To improve dizziness.  To walk without cane.    Currently in Pain?  Yes    Pain Score  8     Pain Location  Back    Pain Orientation  Lower    Pain Descriptors /  Indicators  Aching;Sore;Tender    Pain Type  Chronic pain    Pain Onset  More than a month ago    Pain Frequency  Constant    Aggravating Factors   poor positioning, changes in weater    Pain Relieving Factors  sitting, heating pads           [facility] Adult PT Treatment/Exercise - 02/04/18 1151      Lumbar Exercises: Stretches   Single Knee to Chest Stretch  Right;Left;3 reps;30 seconds;Limitations    Single Knee to Chest Stretch Limitations  cues for hold times      Lumbar Exercises: Aerobic   Nustep  UE/LE's at level 2 for 8 minutes with goal >/= 45 steps/minute for strengthening and activity toleracne      Lumbar Exercises: Seated   Other Seated Lumbar Exercises  seated on green air disc: ant/post pelvic rocks, lateral pelvic rocks, with 2# weighted dowel rod UE raises, with red band rows/shoulder extension/shoulder horizontal abduction. all for 10 reps each with cues for abd bracing and ex form/technique.  Lumbar Exercises: Supine   Ab Set  10 reps;5 seconds;Limitations    AB Set Limitations  cues on technique for deep core muscle activation, needed verbal and tactile cues    Bent Knee Raise  10 reps;Limitations    Bent Knee Raise Limitations  focus on core engagement, then movements of LE's    Bridge  Non-compliant;10 reps;Limitations    Bridge Limitations  cues for abd bracing, incr pelvic lift and breath control with each rep    Other Supine Lumbar Exercises  with emphasis on abd bracing: hip fall outs x 10 reps each side.             PT Short Term Goals - 01/02/18 1001      PT SHORT TERM GOAL #1   Title  Pt will participate in and tolerate full vestibular assessment    Time  5    Period  Weeks    Status  New    Target Date  02/08/18      PT SHORT TERM GOAL #2   Title  Pt will demonstrate independence with initial strengthening/postural/balance/vestibular HEP    Time  5    Period  Weeks    Status  New    Target Date  02/08/18      PT SHORT TERM GOAL #3    Title  Pt will improve LE strength to perform five time sit to stand without UE support to </= 15 seconds    Baseline  17 seconds    Time  5    Period  Weeks    Status  New    Target Date  02/08/18      PT SHORT TERM GOAL #4   Title  Pt will decrease falls risk as indicated by increase in BERG to >/= 50/56    Baseline  48/56    Time  5    Period  Weeks    Status  New    Target Date  02/08/18      PT SHORT TERM GOAL #5   Title  Pt will report 25% reduction in back soreness during activities in standing/puzzles/reading    Baseline  9/10 in standing currently     Time  5    Period  Weeks    Status  New    Target Date  02/08/18        PT Long Term Goals - 01/02/18 1005      PT LONG TERM GOAL #1   Title  Pt will demonstrate independence with final HEP    Time  10    Period  Weeks    Status  New    Target Date  03/13/18      PT LONG TERM GOAL #2   Title  Pt will report 50% reduction in pain during standing activities/puzzles/reading    Time  10    Period  Weeks    Status  New    Target Date  03/13/18      PT LONG TERM GOAL #3   Title  Pt will demonstrate improved LE strength as indicated by five time sit to stand without UE use to </= 13 seconds    Time  10    Period  Weeks    Status  New    Target Date  03/13/18      PT LONG TERM GOAL #4   Title  Pt will decrease falls risk as indicated by BERG score >/= 53/56  Time  10    Period  Weeks    Status  New    Target Date  03/13/18      PT LONG TERM GOAL #5   Title  Pt will ambulate x 230' indoors, 100' outside over pavement MOD I without use of cane and will negotiate 12 stairs with one rail MOD I    Time  10    Period  Weeks    Status  New    Target Date  03/13/18      Additional Long Term Goals   Additional Long Term Goals  Yes      PT LONG TERM GOAL #6   Title  Vestibular goal if indicated            Plan - 02/04/18 1150    Clinical Impression Statement  Today's skilled session focused on  reduction of pain through stretching, then on gentle core strengthening. The pt reported decreased pain at the end of session, 6/10. The pt is making slow, steady progress toward goals and should benefit from continued PT to progress toward unmet goals.     Rehab Potential  Good    PT Frequency  2x / week    PT Duration  Other (comment)   10 weeks   PT Treatment/Interventions  ADLs/Self Care Home Management;Moist Heat;Aquatic Therapy;Canalith Repostioning;Cryotherapy;DME Instruction;Gait training;Stair training;Functional mobility training;Therapeutic activities;Therapeutic exercise;Balance training;Neuromuscular re-education;Patient/family education;Manual techniques;Passive range of motion;Dry needling;Vestibular;Taping    PT Next Visit Plan  continue to address breathing/core activation in supine, adding in LE movements once pt can sequence breathing/activation. hip (4 way) and low back ROM/strengthening/balance.  Walking without cane.  Stair Therapist, nutritional. Increase standing tolerance (for puzzles). Discuss possible aquatic and dry needling?    Consulted and Agree with Plan of Care  Patient       Patient will benefit from skilled therapeutic intervention in order to improve the following deficits and impairments:  Decreased balance, Decreased strength, Dizziness, Difficulty walking, Postural dysfunction, Pain  Visit Diagnosis: Chronic midline low back pain without sciatica  Difficulty in walking, not elsewhere classified  Abnormal posture  Muscle weakness (generalized)     Problem List Patient Active Problem List   Diagnosis Date Noted  . Status post lumbar spinal fusion 08/26/2017  . Bilateral carotid artery stenosis 08/30/2016  . Chronic midline low back pain without sciatica 08/30/2016  . CKD (chronic kidney disease) stage 3, GFR 30-59 ml/min (HCC) 08/30/2016  . Palpitations 04/22/2016  . Left carotid bruit 04/22/2016  . Hypothyroidism   . Hypertension   . Heartburn    . Depression   . Dementia ([place])   . Breast cancer ([place])   . Muscle spasm of back 12/19/2014  . Urinary frequency 12/19/2014    Willow Ora, PTA, [facility] [address], [place], [place] [phone] 02/05/18, 3:36 PM   Name: Regina Porter MRN: [id number] Date of Birth: Nov 06, 1934

## 2018-02-06 ENCOUNTER — Encounter: Payer: Self-pay | Admitting: Physical Therapy

## 2018-02-06 ENCOUNTER — Ambulatory Visit: Payer: Medicare Other | Admitting: Physical Therapy

## 2018-02-06 DIAGNOSIS — G8929 Other chronic pain: Secondary | ICD-10-CM | POA: Diagnosis not present

## 2018-02-06 DIAGNOSIS — M6281 Muscle weakness (generalized): Secondary | ICD-10-CM

## 2018-02-06 DIAGNOSIS — R2681 Unsteadiness on feet: Secondary | ICD-10-CM

## 2018-02-06 DIAGNOSIS — M545 Low back pain, unspecified: Secondary | ICD-10-CM

## 2018-02-06 DIAGNOSIS — R262 Difficulty in walking, not elsewhere classified: Secondary | ICD-10-CM

## 2018-02-06 DIAGNOSIS — R293 Abnormal posture: Secondary | ICD-10-CM

## 2018-02-06 NOTE — Therapy (Signed)
[facility] [address] [place], [place], [address] Phone: [phone]   Fax:  [phone]  Physical Therapy Treatment  Patient Details  Name: Regina Porter MRN: [id number] Date of Birth: [birth year] Referring Provider (PT): [name], NP   Encounter Date: 02/06/2018  PT End of Session - 02/06/18 0856    Visit Number  6    Number of Visits  17    Date for PT Re-Evaluation  03/13/18    Authorization Type  Medicare - 10th visit PN.  VL: medicare guidelines    PT Start Time  0800    PT Stop Time  0848    PT Time Calculation (min)  48 min    Activity Tolerance  Patient tolerated treatment well    Behavior During Therapy  WFL for tasks assessed/performed       Past Medical History:  Diagnosis Date  . Anemia   . Anterolisthesis    grade 1: L3-4  . Anxiety   . Arthritis   . Breast cancer ([place])   . Chronic kidney disease   . Dementia ([place])   . Depression   . Dyspnea    W/ PAIN   . Gastroesophageal reflux disease   . Headache    TO SEE NEURO MD  . Heartburn   . Hypertension   . Hypothyroidism   . Leg cramps   . Post-menopause bleeding   . Restless leg syndrome 10/07/2014    Past Surgical History:  Procedure Laterality Date  . BREAST SURGERY Left    Lumpectomy   . CATARACT EXTRACTION, BILATERAL    . hysteroscopy biopsy      There were no vitals filed for this visit.  Subjective Assessment - 02/06/18 0802    Subjective  Has been adjusting her bed before doing the exercises.  Still having a little soreness.    Patient is accompained by:  Family member    Patient Stated Goals  To improve the strength in her back and relieve pain.  To improve dizziness.  To walk without cane.    Currently in Pain?  Yes    Pain Onset  More than a month ago         [facility] [place] [facility] PT Assessment - 02/06/18 0806      Standardized Balance Assessment   Standardized Balance Assessment  Berg Balance Test;Five Times Sit to  Stand    Five times sit to stand comments   25 seconds from chair with use of UE      Berg Balance Test   Sit to Stand  Able to stand  independently using hands    Standing Unsupported  Able to stand safely 2 minutes    Sitting with Back Unsupported but Feet Supported on Floor or Stool  Able to sit safely and securely 2 minutes    Stand to Sit  Sits safely with minimal use of hands    Transfers  Able to transfer safely, minor use of hands    Standing Unsupported with Eyes Closed  Able to stand 10 seconds safely    Standing Ubsupported with Feet Together  Able to place feet together independently and stand 1 minute safely    From Standing, Reach Forward with Outstretched Arm  Can reach confidently >25 cm (10")    From Standing Position, Pick up Object from Floor  Able to pick up shoe safely and easily    From Standing Position, Turn to Look Behind Over each Shoulder  Looks behind from both sides and weight shifts well    Turn 360 Degrees  Able to turn 360 degrees safely in 4 seconds or less    Standing Unsupported, Alternately Place Feet on Step/Stool  Able to stand independently and safely and complete 8 steps in 20 seconds    Standing Unsupported, One Foot in [place] to place foot tandem independently and hold 30 seconds    Standing on One Leg  Able to lift leg independently and hold 5-10 seconds    Total Score  54    Berg comment:  54/56        Access Code: [email or web address]  URL: [email or web address]  Date: 02/06/2018  Prepared by: Misty Stanley   Exercises reviewed and updated with patient after assessment:  Supine Figure 4 Piriformis Stretch - 2 reps - 30 second hold - 2x daily - 7x weekly  Bridge with Arms Raised - 10 reps - 1 sets - 2x daily - 7x weekly  Supine Lower Trunk Rotation - 10 reps - 1 sets - 2x daily - 7x weekly  Standing Hip Extension with Counter Support - 10 reps - 2 sets - 2x daily - 5x weekly  Sit to Stand with Arm Swing - 12 reps - 1x daily - 7x  weekly  Half Tandem Stance Balance with Head Nods - 10 reps - 2 sets - 1x daily - 7x weekly  Half Tandem Stance Balance with Head Rotation - 10 reps - 2 sets - 1x daily - 7x weekly           PT Education - 02/06/18 0855    Education Details  progress made and adjustment of HEP    Person(s) Educated  Patient    Methods  Explanation;Demonstration;Handout    Comprehension  Verbalized understanding;Returned demonstration       PT Short Term Goals - 02/06/18 0803      PT SHORT TERM GOAL #1   Title  Pt will participate in and tolerate full vestibular assessment    Baseline  Not needed; dizziness resolved by second visit    Time  5    Period  Weeks    Status  Deferred      PT SHORT TERM GOAL #2   Title  Pt will demonstrate independence with initial strengthening/postural/balance/vestibular HEP    Time  5    Period  Weeks    Status  Achieved      PT SHORT TERM GOAL #3   Title  Pt will improve LE strength to perform five time sit to stand without UE support to </= 15 seconds    Baseline  17 seconds  > declined to 25 seconds with UE support on arm chair    Time  5    Period  Weeks    Status  Not Met      PT SHORT TERM GOAL #4   Title  Pt will decrease falls risk as indicated by increase in BERG to >/= 50/56    Baseline  48/56 > 54/56    Time  5    Period  Weeks    Status  Achieved      PT SHORT TERM GOAL #5   Title  Pt will report 25% reduction in back soreness during activities in standing/puzzles/reading    Baseline  9/10 in standing currently > 50% improvement in pain 2 or 3/10 but has been performing in sitting - pt reports she has transitioned her activities to sitting  due to back pain    Time  5    Period  Weeks    Status  Achieved        PT Long Term Goals - 01/02/18 1005      PT LONG TERM GOAL #1   Title  Pt will demonstrate independence with final HEP    Time  10    Period  Weeks    Status  New    Target Date  03/13/18      PT LONG TERM GOAL #2    Title  Pt will report 50% reduction in pain during standing activities/puzzles/reading    Time  10    Period  Weeks    Status  New    Target Date  03/13/18      PT LONG TERM GOAL #3   Title  Pt will demonstrate improved LE strength as indicated by five time sit to stand without UE use to </= 13 seconds    Time  10    Period  Weeks    Status  New    Target Date  03/13/18      PT LONG TERM GOAL #4   Title  Pt will decrease falls risk as indicated by BERG score >/= 53/56    Time  10    Period  Weeks    Status  New    Target Date  03/13/18      PT LONG TERM GOAL #5   Title  Pt will ambulate x 230' indoors, 100' outside over pavement MOD I without use of cane and will negotiate 12 stairs with one rail MOD I    Time  10    Period  Weeks    Status  New    Target Date  03/13/18      Additional Long Term Goals   Additional Long Term Goals  Yes      PT LONG TERM GOAL #6   Title  Vestibular goal if indicated            Plan - 02/06/18 0856    Clinical Impression Statement  Treatment session focused on assessment of progress towards STG.  Pt is making steady progress and is reporting decreased discomfort with leisure activities (reading, puzzles) but pt has transitioned these activities to sitting and continues to be guarded with her movements.  Pt does demonstrate significant improvement in standing balance and demonstrates decreased falls risk as indicated by BERG.  Pt did not meet five time sit to stand goal so functional sit <> stand without use of UE added to HEP.  Continued to progress supine strengthening and standing balance exercises for HEP.  Pt continues to report slight increase in back discomfort after prolonged standing.  Will continue to address and progress standing balance and strengthening exercises.    Rehab Potential  Good    PT Frequency  2x / week    PT Duration  Other (comment)   10 weeks   PT Treatment/Interventions  ADLs/Self Care Home Management;Moist  Heat;Aquatic Therapy;Canalith Repostioning;Cryotherapy;DME Instruction;Gait training;Stair training;Functional mobility training;Therapeutic activities;Therapeutic exercise;Balance training;Neuromuscular re-education;Patient/family education;Manual techniques;Passive range of motion;Dry needling;Vestibular;Taping    PT Next Visit Plan  Continue to progress HEP especially standing balance and LE strengthening in standing.  continue to address breathing/core activation in supine, adding in LE movements once pt can sequence breathing/activation. hip (4 way) and low back ROM/strengthening/balance.  Walking without cane.  Stair Therapist, nutritional. Increase standing tolerance (for puzzles). Discuss possible aquatic  and dry needling?    Consulted and Agree with Plan of Care  Patient       Patient will benefit from skilled therapeutic intervention in order to improve the following deficits and impairments:  Decreased balance, Decreased strength, Dizziness, Difficulty walking, Postural dysfunction, Pain  Visit Diagnosis: Chronic midline low back pain without sciatica  Difficulty in walking, not elsewhere classified  Abnormal posture  Muscle weakness (generalized)  Unsteadiness on feet     Problem List Patient Active Problem List   Diagnosis Date Noted  . Status post lumbar spinal fusion 08/26/2017  . Bilateral carotid artery stenosis 08/30/2016  . Chronic midline low back pain without sciatica 08/30/2016  . CKD (chronic kidney disease) stage 3, GFR 30-59 ml/min (HCC) 08/30/2016  . Palpitations 04/22/2016  . Left carotid bruit 04/22/2016  . Hypothyroidism   . Hypertension   . Heartburn   . Depression   . Dementia ([place])   . Breast cancer ([place])   . Muscle spasm of back 12/19/2014  . Urinary frequency 12/19/2014    Rico Junker, PT, DPT 02/06/18    10:43 AM    [facility] [address] [place], [place],  [address] Phone: [phone]   Fax:  [phone]  Name: Regina Porter MRN: [id number] Date of Birth: 08/05/1934

## 2018-02-06 NOTE — Patient Instructions (Addendum)
Access Code: [email or web address]  URL: [email or web address]  Date: 02/06/2018  Prepared by: Misty Stanley   Exercises  Supine Figure 4 Piriformis Stretch - 2 reps - 30 second hold - 2x daily - 7x weekly  Bridge with Arms Raised - 10 reps - 1 sets - 2x daily - 7x weekly  Supine Lower Trunk Rotation - 10 reps - 1 sets - 2x daily - 7x weekly  Standing Hip Extension with Counter Support - 10 reps - 2 sets - 2x daily - 5x weekly  Sit to Stand with Arm Swing - 12 reps - 1x daily - 7x weekly  Half Tandem Stance Balance with Head Nods - 10 reps - 2 sets - 1x daily - 7x weekly  Half Tandem Stance Balance with Head Rotation - 10 reps - 2 sets - 1x daily - 7x weekly

## 2018-02-11 ENCOUNTER — Encounter: Payer: Self-pay | Admitting: Physical Therapy

## 2018-02-11 ENCOUNTER — Ambulatory Visit: Payer: Medicare Other | Admitting: Physical Therapy

## 2018-02-11 DIAGNOSIS — M6281 Muscle weakness (generalized): Secondary | ICD-10-CM

## 2018-02-11 DIAGNOSIS — R2681 Unsteadiness on feet: Secondary | ICD-10-CM | POA: Diagnosis not present

## 2018-02-11 DIAGNOSIS — G8929 Other chronic pain: Secondary | ICD-10-CM

## 2018-02-11 DIAGNOSIS — M545 Low back pain, unspecified: Secondary | ICD-10-CM

## 2018-02-11 DIAGNOSIS — R293 Abnormal posture: Secondary | ICD-10-CM | POA: Diagnosis not present

## 2018-02-11 DIAGNOSIS — R262 Difficulty in walking, not elsewhere classified: Secondary | ICD-10-CM | POA: Diagnosis not present

## 2018-02-11 NOTE — Patient Instructions (Signed)
Access Code: [email or web address]  URL: [email or web address]  Date: 02/11/2018  Prepared by: Willow Ora   Exercises  Supine Figure 4 Piriformis Stretch - 2 reps - 30 second hold - 2x daily - 7x weekly  Supine Lower Trunk Rotation - 10 reps - 1 sets - 2x daily - 7x weekly  Supine Bridge - 10 reps - 1 sets - 5 hold - 1x daily - 5x weekly  Hooklying Clamshell with Resistance - 10 reps - 1 sets - 5 hold - 1x daily - 5x weekly  Sit to Stand - 10 reps - 1 sets - 1x daily - 5x weekly  Romberg Stance Eyes Closed on Foam Pad - 3 reps - 1 sets - 3 hold - 1x daily - 5x weekly  Tandem Stance on Foam Pad with Eyes Open - 3 reps - 1 sets - 15 hold - 1x daily - 5x weekly

## 2018-02-12 NOTE — Therapy (Signed)
[facility] [address] [place], [place], [address] Phone: [phone]   Fax:  [phone]  Physical Therapy Treatment  Patient Details  Name: Regina Porter MRN: [id number] Date of Birth: 01/16/1934 Referring Provider (PT): [name], NP   Encounter Date: 02/11/2018  PT End of Session - 02/11/18 1023    Visit Number  7    Number of Visits  17    Date for PT Re-Evaluation  03/13/18    Authorization Type  Medicare - 10th visit PN.  VL: medicare guidelines    PT Start Time  1019    PT Stop Time  1100    PT Time Calculation (min)  41 min    Activity Tolerance  Patient tolerated treatment well;No increased pain    Behavior During Therapy  WFL for tasks assessed/performed       Past Medical History:  Diagnosis Date  . Anemia   . Anterolisthesis    grade 1: L3-4  . Anxiety   . Arthritis   . Breast cancer ([place])   . Chronic kidney disease   . Dementia ([place])   . Depression   . Dyspnea    W/ PAIN   . Gastroesophageal reflux disease   . Headache    TO SEE NEURO MD  . Heartburn   . Hypertension   . Hypothyroidism   . Leg cramps   . Post-menopause bleeding   . Restless leg syndrome 10/07/2014    Past Surgical History:  Procedure Laterality Date  . BREAST SURGERY Left    Lumpectomy   . CATARACT EXTRACTION, BILATERAL    . hysteroscopy biopsy      There were no vitals filed for this visit.  Subjective Assessment - 02/11/18 1022    Subjective  No new complaints. No falls. Does report some of the ex's at home are too hard to do, daughter agrees with this.     Patient is accompained by:  Family member    Patient Stated Goals  To improve the strength in her back and relieve pain.  To improve dizziness.  To walk without cane.    Currently in Pain?  Yes    Pain Score  3     Pain Location  Back    Pain Orientation  Lower    Pain Descriptors / Indicators  Aching;Sore;Tender    Pain Type  Chronic pain    Pain Onset  More than a month ago    Pain Frequency  Constant    Aggravating Factors   poor positioning, changes in weather    Pain Relieving Factors  sitting, heating pads         [facility] Adult PT Treatment/Exercise - 02/11/18 1028      Lumbar Exercises: Supine   Bent Knee Raise  10 reps;Limitations    Bent Knee Raise Limitations  focus on core engagement, then movements of LE's    Other Supine Lumbar Exercises  hooklying with red band around knees- clamshell with abd bracing, 5 sec holds x 10 reps        Issued the following to pt's HEP today after performance in session. Cues on form and technique with some ex's needed. Min guard assist with balance ex's.    Access Code: [email or web address]  URL: [email or web address]  Date: 02/11/2018  Prepared by: Willow Ora   Exercises  Supine Figure 4 Piriformis Stretch - 2 reps - 30 second hold - 2x daily - 7x weekly  Supine  Lower Trunk Rotation - 10 reps - 1 sets - 2x daily - 7x weekly  Supine Bridge - 10 reps - 1 sets - 5 hold - 1x daily - 5x weekly  Hooklying Clamshell with Resistance - 10 reps - 1 sets - 5 hold - 1x daily - 5x weekly  Sit to Stand - 10 reps - 1 sets - 1x daily - 5x weekly  Romberg Stance Eyes Closed on Foam Pad - 3 reps - 1 sets - 3 hold - 1x daily - 5x weekly  Tandem Stance on Foam Pad with Eyes Open - 3 reps - 1 sets - 15 hold - 1x daily - 5x weekly        PT Education - 02/11/18 1332    Education Details  revised HEP    Person(s) Educated  Patient;Child(ren)    Methods  Explanation;Demonstration;Verbal cues;Handout    Comprehension  Verbalized understanding;Returned demonstration;Verbal cues required;Need further instruction       PT Short Term Goals - 02/06/18 0803      PT SHORT TERM GOAL #1   Title  Pt will participate in and tolerate full vestibular assessment    Baseline  Not needed; dizziness resolved by second visit    Time  5    Period  Weeks    Status  Deferred      PT SHORT TERM GOAL #2    Title  Pt will demonstrate independence with initial strengthening/postural/balance/vestibular HEP    Time  5    Period  Weeks    Status  Achieved      PT SHORT TERM GOAL #3   Title  Pt will improve LE strength to perform five time sit to stand without UE support to </= 15 seconds    Baseline  17 seconds  > declined to 25 seconds with UE support on arm chair    Time  5    Period  Weeks    Status  Not Met      PT SHORT TERM GOAL #4   Title  Pt will decrease falls risk as indicated by increase in BERG to >/= 50/56    Baseline  48/56 > 54/56    Time  5    Period  Weeks    Status  Achieved      PT SHORT TERM GOAL #5   Title  Pt will report 25% reduction in back soreness during activities in standing/puzzles/reading    Baseline  9/10 in standing currently > 50% improvement in pain 2 or 3/10 but has been performing in sitting - pt reports she has transitioned her activities to sitting due to back pain    Time  5    Period  Weeks    Status  Achieved        PT Long Term Goals - 01/02/18 1005      PT LONG TERM GOAL #1   Title  Pt will demonstrate independence with final HEP    Time  10    Period  Weeks    Status  New    Target Date  03/13/18      PT LONG TERM GOAL #2   Title  Pt will report 50% reduction in pain during standing activities/puzzles/reading    Time  10    Period  Weeks    Status  New    Target Date  03/13/18      PT LONG TERM GOAL #3   Title  Pt will  demonstrate improved LE strength as indicated by five time sit to stand without UE use to </= 13 seconds    Time  10    Period  Weeks    Status  New    Target Date  03/13/18      PT LONG TERM GOAL #4   Title  Pt will decrease falls risk as indicated by BERG score >/= 53/56    Time  10    Period  Weeks    Status  New    Target Date  03/13/18      PT LONG TERM GOAL #5   Title  Pt will ambulate x 230' indoors, 100' outside over pavement MOD I without use of cane and will negotiate 12 stairs with one rail  MOD I    Time  10    Period  Weeks    Status  New    Target Date  03/13/18      Additional Long Term Goals   Additional Long Term Goals  Yes      PT LONG TERM GOAL #6   Title  Vestibular goal if indicated            Plan - 02/11/18 1026    Clinical Impression Statement  Today's skilled session focused on review and revisment of HEP due to pt reports of it being too hard. No issues reported with ex's performed today and added to HEP. The pt is progressing toward goals and should benefit from continued PT to progress toward unmet goals.    Rehab Potential  Good    PT Frequency  2x / week    PT Duration  Other (comment)   10 weeks   PT Treatment/Interventions  ADLs/Self Care Home Management;Moist Heat;Aquatic Therapy;Canalith Repostioning;Cryotherapy;DME Instruction;Gait training;Stair training;Functional mobility training;Therapeutic activities;Therapeutic exercise;Balance training;Neuromuscular re-education;Patient/family education;Manual techniques;Passive range of motion;Dry needling;Vestibular;Taping    PT Next Visit Plan  continue to address breathing/core activation in supine, adding in LE movements once pt can sequence breathing/activation. hip (4 way) and low back ROM/strengthening/balance.  Walking without cane.  Stair Therapist, nutritional. Increase standing tolerance (for puzzles). Discuss possible aquatic and dry needling?    Consulted and Agree with Plan of Care  Patient       Patient will benefit from skilled therapeutic intervention in order to improve the following deficits and impairments:  Decreased balance, Decreased strength, Dizziness, Difficulty walking, Postural dysfunction, Pain  Visit Diagnosis: Chronic midline low back pain without sciatica  Difficulty in walking, not elsewhere classified  Abnormal posture  Muscle weakness (generalized)     Problem List Patient Active Problem List   Diagnosis Date Noted  . Status post lumbar spinal fusion  08/26/2017  . Bilateral carotid artery stenosis 08/30/2016  . Chronic midline low back pain without sciatica 08/30/2016  . CKD (chronic kidney disease) stage 3, GFR 30-59 ml/min (HCC) 08/30/2016  . Palpitations 04/22/2016  . Left carotid bruit 04/22/2016  . Hypothyroidism   . Hypertension   . Heartburn   . Depression   . Dementia ([place])   . Breast cancer ([place])   . Muscle spasm of back 12/19/2014  . Urinary frequency 12/19/2014   Willow Ora, PTA, [facility] [address], [place], [place] [address] [phone] 02/12/18, 1:36 PM  Name: Regina Porter MRN: [id number] Date of Birth: 1934/02/15

## 2018-02-13 ENCOUNTER — Ambulatory Visit: Payer: Medicare Other | Admitting: Physical Therapy

## 2018-02-13 ENCOUNTER — Encounter: Payer: Self-pay | Admitting: Physical Therapy

## 2018-02-13 DIAGNOSIS — M545 Low back pain, unspecified: Secondary | ICD-10-CM

## 2018-02-13 DIAGNOSIS — R2681 Unsteadiness on feet: Secondary | ICD-10-CM | POA: Diagnosis not present

## 2018-02-13 DIAGNOSIS — M6281 Muscle weakness (generalized): Secondary | ICD-10-CM | POA: Diagnosis not present

## 2018-02-13 DIAGNOSIS — R262 Difficulty in walking, not elsewhere classified: Secondary | ICD-10-CM

## 2018-02-13 DIAGNOSIS — R293 Abnormal posture: Secondary | ICD-10-CM | POA: Diagnosis not present

## 2018-02-13 DIAGNOSIS — G8929 Other chronic pain: Secondary | ICD-10-CM | POA: Diagnosis not present

## 2018-02-13 NOTE — Therapy (Signed)
[facility] [address] [place], [place], [address] Phone: [phone]   Fax:  [phone]  Physical Therapy Treatment  Patient Details  Name: Regina Porter MRN: [id number] Date of Birth: [birth year] Referring Provider (PT): [name], NP   Encounter Date: 02/13/2018  PT End of Session - 02/13/18 0858    Visit Number  8    Number of Visits  17    Date for PT Re-Evaluation  03/13/18    Authorization Type  Medicare - 10th visit PN.  VL: medicare guidelines    PT Start Time  0806    PT Stop Time  0849    PT Time Calculation (min)  43 min    Activity Tolerance  Patient tolerated treatment well;No increased pain    Behavior During Therapy  WFL for tasks assessed/performed       Past Medical History:  Diagnosis Date  . Anemia   . Anterolisthesis    grade 1: L3-4  . Anxiety   . Arthritis   . Breast cancer ([place])   . Chronic kidney disease   . Dementia ([place])   . Depression   . Dyspnea    W/ PAIN   . Gastroesophageal reflux disease   . Headache    TO SEE NEURO MD  . Heartburn   . Hypertension   . Hypothyroidism   . Leg cramps   . Post-menopause bleeding   . Restless leg syndrome 10/07/2014    Past Surgical History:  Procedure Laterality Date  . BREAST SURGERY Left    Lumpectomy   . CATARACT EXTRACTION, BILATERAL    . hysteroscopy biopsy      There were no vitals filed for this visit.  Subjective Assessment - 02/13/18 0807    Subjective  Changes to exercises are better.  Pt asking today about how to address the pain she feels in her back when she gets out of the car.    Patient is accompained by:  Family member    Patient Stated Goals  To improve the strength in her back and relieve pain.  To improve dizziness.  To walk without cane.    Currently in Pain?  Yes    Pain Score  3     Pain Location  Back    Pain Orientation  Mid;Lower    Pain Descriptors / Indicators  Sore    Pain Type  Chronic  pain    Pain Onset  More than a month ago                       [facility] Adult PT Treatment/Exercise - 02/13/18 0852      Lumbar Exercises: Stretches   Lower Trunk Rotation  3 reps    Lower Trunk Rotation Limitations  seated    Hip Flexor Stretch  Left;Right;1 rep;20 seconds    Pelvic Tilt  5 reps    Pelvic Tilt Limitations  seated    Standing Side Bend  Right;Left;1 rep;20 seconds    Standing Side Bend Limitations  standing with one foot back, lumbar extension and side bend    Standing Extension  2 reps;20 seconds    Standing Extension Limitations  with R and then L leg back, staggered stance for hip flexor, lateral trunk and gastroc stretch    Gastroc Stretch  Right;Left;1 rep;20 seconds    Gastroc Stretch Limitations  standing staggered stance    Other Lumbar Stretch Exercise  seated  forward flexion reaching to floor x 3 reps        Access Code: [email or web address]  URL: [email or web address]  Date: 02/13/2018  Prepared by: Misty Stanley   Exercises  Seated Forward Bending - 3 reps - 1x daily - 7x weekly  Seated Cat Camel - 5 reps - 1x daily - 7x weekly  Seated Trunk Rotation Stretch - 3 reps - 1x daily - 7x weekly     Balance Exercises - 02/13/18 0855      Balance Exercises: Standing   Sidestepping  4 reps   to L and R with high knees   Marching Limitations  forwards x 4 reps, walking high knee marches with no UE support        PT Education - 02/13/18 0856    Education Details  exercises to perform before getting out of car or van to "warm up" the spine and spinal mm; advised to keep on van dash as reminder    Person(s) Educated  Patient;Child(ren)    Methods  Explanation;Demonstration;Handout    Comprehension  Verbalized understanding;Returned demonstration       PT Short Term Goals - 02/06/18 0803      PT SHORT TERM GOAL #1   Title  Pt will participate in and tolerate full vestibular assessment    Baseline  Not needed; dizziness resolved by  second visit    Time  5    Period  Weeks    Status  Deferred      PT SHORT TERM GOAL #2   Title  Pt will demonstrate independence with initial strengthening/postural/balance/vestibular HEP    Time  5    Period  Weeks    Status  Achieved      PT SHORT TERM GOAL #3   Title  Pt will improve LE strength to perform five time sit to stand without UE support to </= 15 seconds    Baseline  17 seconds  > declined to 25 seconds with UE support on arm chair    Time  5    Period  Weeks    Status  Not Met      PT SHORT TERM GOAL #4   Title  Pt will decrease falls risk as indicated by increase in BERG to >/= 50/56    Baseline  48/56 > 54/56    Time  5    Period  Weeks    Status  Achieved      PT SHORT TERM GOAL #5   Title  Pt will report 25% reduction in back soreness during activities in standing/puzzles/reading    Baseline  9/10 in standing currently > 50% improvement in pain 2 or 3/10 but has been performing in sitting - pt reports she has transitioned her activities to sitting due to back pain    Time  5    Period  Weeks    Status  Achieved        PT Long Term Goals - 01/02/18 1005      PT LONG TERM GOAL #1   Title  Pt will demonstrate independence with final HEP    Time  10    Period  Weeks    Status  New    Target Date  03/13/18      PT LONG TERM GOAL #2   Title  Pt will report 50% reduction in pain during standing activities/puzzles/reading    Time  10    Period  Weeks    Status  New    Target Date  03/13/18      PT LONG TERM GOAL #3   Title  Pt will demonstrate improved LE strength as indicated by five time sit to stand without UE use to </= 13 seconds    Time  10    Period  Weeks    Status  New    Target Date  03/13/18      PT LONG TERM GOAL #4   Title  Pt will decrease falls risk as indicated by BERG score >/= 53/56    Time  10    Period  Weeks    Status  New    Target Date  03/13/18      PT LONG TERM GOAL #5   Title  Pt will ambulate x 230' indoors, 100'  outside over pavement MOD I without use of cane and will negotiate 12 stairs with one rail MOD I    Time  10    Period  Weeks    Status  New    Target Date  03/13/18      Additional Long Term Goals   Additional Long Term Goals  Yes      PT LONG TERM GOAL #6   Title  Vestibular goal if indicated            Plan - 02/13/18 0858    Clinical Impression Statement  Pt reporting mm spasm/"grabbing" when she first stands up and takes a few steps (getting out of car or van).  Reviewed three exercises for patient to perform before getting out of car/van to mobilize spine and muscles.  Pt return demonstrated each exercise and pt given handout to keep in car as reminder.  Performed dynamic balance training without UE support to continue to work towards patient's goal of ambulating without cane.  Will continue to address and progress towards LTG.    Rehab Potential  Good    PT Frequency  2x / week    PT Duration  Other (comment)   10 weeks   PT Treatment/Interventions  ADLs/Self Care Home Management;Moist Heat;Aquatic Therapy;Canalith Repostioning;Cryotherapy;DME Instruction;Gait training;Stair training;Functional mobility training;Therapeutic activities;Therapeutic exercise;Balance training;Neuromuscular re-education;Patient/family education;Manual techniques;Passive range of motion;Dry needling;Vestibular;Taping    PT Next Visit Plan  How are the exercises going to do before getting out of car/van?  review stepping over/clearing obstacles forwards and laterally without UE support.  Step ups and stair negotiation training.      Consulted and Agree with Plan of Care  Patient    Family Member Consulted  daughter       Patient will benefit from skilled therapeutic intervention in order to improve the following deficits and impairments:  Decreased balance, Decreased strength, Dizziness, Difficulty walking, Postural dysfunction, Pain  Visit Diagnosis: Chronic midline low back pain without  sciatica  Difficulty in walking, not elsewhere classified  Abnormal posture  Muscle weakness (generalized)  Unsteadiness on feet     Problem List Patient Active Problem List   Diagnosis Date Noted  . Status post lumbar spinal fusion 08/26/2017  . Bilateral carotid artery stenosis 08/30/2016  . Chronic midline low back pain without sciatica 08/30/2016  . CKD (chronic kidney disease) stage 3, GFR 30-59 ml/min (HCC) 08/30/2016  . Palpitations 04/22/2016  . Left carotid bruit 04/22/2016  . Hypothyroidism   . Hypertension   . Heartburn   . Depression   . Dementia ([place])   . Breast cancer ([place])   . Muscle spasm of back 12/19/2014  .  Urinary frequency 12/19/2014    Rico Junker, PT, DPT 02/13/18    9:02 AM    [facility] [address] [place], [place], [address] Phone: [phone]   Fax:  [phone]  Name: Regina Porter MRN: [id number] Date of Birth: June 29, 1934

## 2018-02-13 NOTE — Patient Instructions (Signed)
Access Code: [email or web address]  URL: [email or web address]  Date: 02/13/2018  Prepared by: Misty Stanley   Exercises  Seated Forward Bending - 3 reps - 1x daily - 7x weekly  Seated Cat Camel - 5 reps - 1x daily - 7x weekly  Seated Trunk Rotation Stretch - 3 reps - 1x daily - 7x weekly

## 2018-02-18 ENCOUNTER — Encounter: Payer: Self-pay | Admitting: Physical Therapy

## 2018-02-18 ENCOUNTER — Ambulatory Visit: Payer: Medicare Other | Attending: Nurse Practitioner | Admitting: Physical Therapy

## 2018-02-18 VITALS — BP 167/68 | HR 71

## 2018-02-18 DIAGNOSIS — M545 Low back pain, unspecified: Secondary | ICD-10-CM

## 2018-02-18 DIAGNOSIS — G8929 Other chronic pain: Secondary | ICD-10-CM | POA: Insufficient documentation

## 2018-02-18 DIAGNOSIS — R262 Difficulty in walking, not elsewhere classified: Secondary | ICD-10-CM | POA: Diagnosis not present

## 2018-02-18 DIAGNOSIS — R293 Abnormal posture: Secondary | ICD-10-CM | POA: Insufficient documentation

## 2018-02-18 DIAGNOSIS — M6281 Muscle weakness (generalized): Secondary | ICD-10-CM | POA: Diagnosis not present

## 2018-02-18 DIAGNOSIS — R42 Dizziness and giddiness: Secondary | ICD-10-CM | POA: Insufficient documentation

## 2018-02-18 DIAGNOSIS — R2681 Unsteadiness on feet: Secondary | ICD-10-CM | POA: Insufficient documentation

## 2018-02-18 NOTE — Patient Instructions (Signed)
Over Head Pull: Narrow Grip    On back, knees bent, feet flat, band across thighs, elbows straight but relaxed. Pull hands apart (start). Keeping elbows straight, bring arms up and over head, hands toward floor. Keep pull steady on band. Hold momentarily. Return slowly, keeping pull steady, back to start. Repeat ___ times. Band color ______   Copyright  VHI. All rights reserved.  Over Head Pull: Wide Grip    On back, knees bent, feet flat, band across thighs, elbows straight but relaxed. Pull hands apart (start). Keeping elbows straight, bring arms up and over head, hands toward floor. Keep steady pull on band. Hold momentarily. Return slowly, keeping pull steady, back to start. Repeat ___ times. Band color ______    Copyright  VHI. All rights reserved.  Side Pull: Double Arm    On back, knees bent, feet flat. Arms perpendicular to body, shoulder level, elbows straight but relaxed. Pull arms out to sides, elbows straight. Resistance band comes across collarbones, hands toward floor. Hold momentarily. Slowly return to starting position. Repeat ___ times. Band color _____    Copyright  VHI. All rights reserved.  Side Pull: Single Arm    On back, knees bent, feet flat. Arms perpendicular to body, shoulder level. Keep left arm still. Pull right arm out to side, elbow straight, hand down toward floor. Hold momentarily. Slowly return to starting position. Repeat ___ times. Do with left arm. Band color ______   Copyright  VHI. All rights reserved.  Sash    On back, knees bent, feet flat, left hand on left hip, right hand above left. Pull right arm DIAGONALLY (hip to shoulder) across chest. Bring right arm along head toward floor. Hold momentarily. Slowly return to starting position. Repeat ___ times. Do with left arm. Band color ______   Copyright  VHI. All rights reserved.  Shoulder Rotation: Double Arm    On back, knees bent, feet flat, elbows tucked at sides, bent 90,  hands palms up. Pull hands apart and down toward floor, keeping elbows near sides. Hold momentarily. Slowly return to starting position. Repeat ___ times. Band color ______   Copyright  VHI. All rights reserved.  Shoulder Rotation: Single Arm    On back, knees bent, feet flat, elbows tucked at sides, bent 90, hands palms up. Keep left arm still. Pull right hand out to side and down toward floor, keeping elbow near side. Hold momentarily. Slowly return to starting position. Repeat ___ times. Do with left arm. Band color ______   Copyright  VHI. All rights reserved.

## 2018-02-18 NOTE — Therapy (Signed)
[facility] [address] [place], [place], [address] Phone: [phone]   Fax:  [phone]  Physical Therapy Treatment  Patient Details  Name: Regina Porter MRN: [id number] Date of Birth: November 25, 1934 Referring Provider (PT): [name], NP   Encounter Date: 02/18/2018  PT End of Session - 02/18/18 1540    Visit Number  9    Number of Visits  17    Date for PT Re-Evaluation  03/13/18    Authorization Type  Medicare - 10th visit PN.  VL: medicare guidelines    PT Start Time  1024    PT Stop Time  1104    PT Time Calculation (min)  40 min    Activity Tolerance  Patient limited by fatigue    Behavior During Therapy  [facility] for tasks assessed/performed       Past Medical History:  Diagnosis Date  . Anemia   . Anterolisthesis    grade 1: L3-4  . Anxiety   . Arthritis   . Breast cancer ([place])   . Chronic kidney disease   . Dementia ([place])   . Depression   . Dyspnea    W/ PAIN   . Gastroesophageal reflux disease   . Headache    TO SEE NEURO MD  . Heartburn   . Hypertension   . Hypothyroidism   . Leg cramps   . Post-menopause bleeding   . Restless leg syndrome 10/07/2014    Past Surgical History:  Procedure Laterality Date  . BREAST SURGERY Left    Lumpectomy   . CATARACT EXTRACTION, BILATERAL    . hysteroscopy biopsy      Vitals:   02/18/18 1031  BP: (!) 167/68  Pulse: 71    Subjective Assessment - 02/18/18 1032    Subjective  Asking for water today; feeling more fatigued today.  Did do the exercises before getting out of the van and did not have the pain in her back after the first few steps.  Does not have her cane today.    Patient is accompained by:  Family member    Patient Stated Goals  To improve the strength in her back and relieve pain.  To improve dizziness.  To walk without cane.    Currently in Pain?  No/denies    Pain Onset  More than a month ago                        [facility] Adult PT Treatment/Exercise - 02/18/18 1052      Exercises   Exercises  Shoulder      Shoulder Exercises: Supine   Flexion  Strengthening;Both;5 reps;Theraband    Theraband Level (Shoulder Flexion)  Level 3 (Green)    Flexion Limitations  in supine with bilat UE holding green theraband pulling apart slightly (Y shape) during flexion; also cued for breathing and core activation/stabilization during UE movement    Other Supine Exercises  --      Shoulder Exercises: Standing   Extension  Strengthening;Both;5 reps;Theraband    Theraband Level (Shoulder Extension)  Level 3 (Green)    Extension Limitations  Attempted in standing but pt demonstrated increased compensatory use of shoulder elevation, trunk extension, valsalva and increased neck tension; transitioned to supine          Balance Exercises - 02/18/18 1040      Balance Exercises: Standing   Step Over Hurdles / Cones  Initiated gait training without  cane with stepping over low beams x 4 forwards x 2 sets, laterally to L and R x 4 x 2 sets with supervision and no LOB.  Transitioned to low orange obstacles closer together and performed forwards, retro and R/L lateral step overs alternating LE x 4 reps x 2 sets each. Pt performed with supervision with no LOB          PT Short Term Goals - 02/06/18 0803      PT SHORT TERM GOAL #1   Title  Pt will participate in and tolerate full vestibular assessment    Baseline  Not needed; dizziness resolved by second visit    Time  5    Period  Weeks    Status  Deferred      PT SHORT TERM GOAL #2   Title  Pt will demonstrate independence with initial strengthening/postural/balance/vestibular HEP    Time  5    Period  Weeks    Status  Achieved      PT SHORT TERM GOAL #3   Title  Pt will improve LE strength to perform five time sit to stand without UE support to </= 15 seconds    Baseline  17 seconds  > declined to 25 seconds with UE support on  arm chair    Time  5    Period  Weeks    Status  Not Met      PT SHORT TERM GOAL #4   Title  Pt will decrease falls risk as indicated by increase in BERG to >/= 50/56    Baseline  48/56 > 54/56    Time  5    Period  Weeks    Status  Achieved      PT SHORT TERM GOAL #5   Title  Pt will report 25% reduction in back soreness during activities in standing/puzzles/reading    Baseline  9/10 in standing currently > 50% improvement in pain 2 or 3/10 but has been performing in sitting - pt reports she has transitioned her activities to sitting due to back pain    Time  5    Period  Weeks    Status  Achieved        PT Long Term Goals - 01/02/18 1005      PT LONG TERM GOAL #1   Title  Pt will demonstrate independence with final HEP    Time  10    Period  Weeks    Status  New    Target Date  03/13/18      PT LONG TERM GOAL #2   Title  Pt will report 50% reduction in pain during standing activities/puzzles/reading    Time  10    Period  Weeks    Status  New    Target Date  03/13/18      PT LONG TERM GOAL #3   Title  Pt will demonstrate improved LE strength as indicated by five time sit to stand without UE use to </= 13 seconds    Time  10    Period  Weeks    Status  New    Target Date  03/13/18      PT LONG TERM GOAL #4   Title  Pt will decrease falls risk as indicated by BERG score >/= 53/56    Time  10    Period  Weeks    Status  New    Target Date  03/13/18  PT LONG TERM GOAL #5   Title  Pt will ambulate x 230' indoors, 100' outside over pavement MOD I without use of cane and will negotiate 12 stairs with one rail MOD I    Time  10    Period  Weeks    Status  New    Target Date  03/13/18      Additional Long Term Goals   Additional Long Term Goals  Yes      PT LONG TERM GOAL #6   Title  Vestibular goal if indicated            Plan - 02/18/18 1103    Clinical Impression Statement  Treatment session focused on gait training without cane with low and  medium height obstacle negotiation and weight shifting in a variety of directions.  Pt able to perform with supervision and no LOB on solid floor.  Pt continued to report difficulty maintaining upright posture during gait without cane; attempted to perform postural exercises in standing but pt demonstrated significant compensatory use of shoulder elevation and lumbar extension to perform.  Returned to supine to perform postural exercises.  Will continue to address and review at next session.    Rehab Potential  Good    PT Frequency  2x / week    PT Duration  Other (comment)   10 weeks   PT Treatment/Interventions  ADLs/Self Care Home Management;Moist Heat;Aquatic Therapy;Canalith Repostioning;Cryotherapy;DME Instruction;Gait training;Stair training;Functional mobility training;Therapeutic activities;Therapeutic exercise;Balance training;Neuromuscular re-education;Patient/family education;Manual techniques;Passive range of motion;Dry needling;Vestibular;Taping    PT Next Visit Plan  Send to me for 10th visit PN.  Supine postural exercises - see pt instructions with theraband.   Step ups and stair negotiation training.  Gait without cane training - standing with improved posture    Consulted and Agree with Plan of Care  Patient    Family Member Consulted  daughter       Patient will benefit from skilled therapeutic intervention in order to improve the following deficits and impairments:  Decreased balance, Decreased strength, Dizziness, Difficulty walking, Postural dysfunction, Pain  Visit Diagnosis: Chronic midline low back pain without sciatica  Difficulty in walking, not elsewhere classified  Abnormal posture  Muscle weakness (generalized)  Unsteadiness on feet     Problem List Patient Active Problem List   Diagnosis Date Noted  . Status post lumbar spinal fusion 08/26/2017  . Bilateral carotid artery stenosis 08/30/2016  . Chronic midline low back pain without sciatica 08/30/2016   . CKD (chronic kidney disease) stage 3, GFR 30-59 ml/min (HCC) 08/30/2016  . Palpitations 04/22/2016  . Left carotid bruit 04/22/2016  . Hypothyroidism   . Hypertension   . Heartburn   . Depression   . Dementia ([place])   . Breast cancer ([place])   . Muscle spasm of back 12/19/2014  . Urinary frequency 12/19/2014    Rico Junker, PT, DPT 02/18/18    3:49 PM    [facility] [address] [place], [place], [address] Phone: [phone]   Fax:  [phone]  Name: Regina Porter MRN: [id number] Date of Birth: 12-22-1934

## 2018-02-19 ENCOUNTER — Other Ambulatory Visit: Payer: Self-pay | Admitting: Nurse Practitioner

## 2018-02-20 ENCOUNTER — Ambulatory Visit: Payer: Medicare Other | Admitting: Physical Therapy

## 2018-02-20 ENCOUNTER — Encounter: Payer: Self-pay | Admitting: Physical Therapy

## 2018-02-20 DIAGNOSIS — R2681 Unsteadiness on feet: Secondary | ICD-10-CM | POA: Diagnosis not present

## 2018-02-20 DIAGNOSIS — M6281 Muscle weakness (generalized): Secondary | ICD-10-CM | POA: Diagnosis not present

## 2018-02-20 DIAGNOSIS — R262 Difficulty in walking, not elsewhere classified: Secondary | ICD-10-CM

## 2018-02-20 DIAGNOSIS — R293 Abnormal posture: Secondary | ICD-10-CM | POA: Diagnosis not present

## 2018-02-20 DIAGNOSIS — M545 Low back pain, unspecified: Secondary | ICD-10-CM

## 2018-02-20 DIAGNOSIS — G8929 Other chronic pain: Secondary | ICD-10-CM | POA: Diagnosis not present

## 2018-02-20 NOTE — Patient Instructions (Addendum)
Over Head Pull: Narrow Grip    On back, knees bent, feet flat, band across thighs, elbows straight but relaxed. Pull hands apart (start). Keeping elbows straight, bring arms up and over head, hands toward floor. Keep pull steady on band. Hold momentarily. Return slowly, keeping pull steady, back to start. Repeat _10__ times. Band color ___red___   Copyright  VHI. All rights reserved.  Over Head Pull: Wide Grip    On back, knees bent, feet flat, band across thighs, elbows straight but relaxed. Pull hands apart (start). Keeping elbows straight, bring arms up and over head, hands toward floor. Keep steady pull on band. Hold momentarily. Return slowly, keeping pull steady, back to start. Repeat __10_ times. Band color ___red___    Copyright  VHI. All rights reserved.  Side Pull: Double Arm    On back, knees bent, feet flat. Arms perpendicular to body, shoulder level, elbows straight but relaxed. Pull arms out to sides, elbows straight. Resistance band comes across collarbones, hands toward floor. Hold momentarily. Slowly return to starting position. Repeat _10__ times. Band color _red____    Copyright  VHI. All rights reserved.  Side Pull: Single Arm    On back, knees bent, feet flat. Arms raised up as shown, shoulder level. Keep left arm still. Pull right arm out to side, elbow straight, hand down toward floor. Hold momentarily. Slowly return to starting position. Repeat with other side. Repeat _10__ times. Do with left arm. Band color ___red___   Copyright  VHI. All rights reserved.  Sash    On back, knees bent, feet flat, left hand on left hip, right hand above left. Pull right arm DIAGONALLY (hip to shoulder) across chest. Bring right arm along head toward floor. Hold momentarily. Slowly return to starting position. Repeat __10_ times. Do with left arm. Band color ___red___   Copyright  VHI. All rights reserved.  Shoulder Rotation: Double Arm    On back, knees bent,  feet flat, elbows tucked at sides, bent 90, hands palms up. Pull hands apart and down toward floor, keeping elbows near sides. Hold momentarily. Slowly return to starting position. Repeat ___ times. Band color ______   Copyright  VHI. All rights reserved.  Shoulder Rotation: Single Arm    On back, knees bent, feet flat, elbows tucked at sides, bent 90, hands palms up. Keep left arm still. Pull right hand out to side and down toward floor, keeping elbow near side. Hold momentarily. Slowly return to starting position. Repeat __10_ times. Do with left arm. Band color __red____   Copyright  VHI. All rights reserved.

## 2018-02-21 NOTE — Therapy (Addendum)
[facility] [address] [place], [place], [address] Phone: [phone]   Fax:  [phone]  Physical Therapy Treatment and 10th visit PN  Patient Details  Name: Regina Porter MRN: [id number] Date of Birth: 1934-08-03 Referring Provider (PT): [name], NP   Encounter Date: 02/20/2018   10th Visit Physical Therapy Progress Note  Dates of Reporting Period: 01/02/2018 to 02/20/2018  Rico Junker, PT, DPT 02/23/18    12:40 PM    PT End of Session - 02/20/18 1651    Visit Number  10    Number of Visits  17    Date for PT Re-Evaluation  03/13/18    Authorization Type  Medicare - 10th visit PN.  VL: medicare guidelines    PT Start Time  0804    PT Stop Time  0843    PT Time Calculation (min)  39 min    Activity Tolerance  Patient limited by fatigue;Patient tolerated treatment well    Behavior During Therapy  [facility] for tasks assessed/performed       Past Medical History:  Diagnosis Date  . Anemia   . Anterolisthesis    grade 1: L3-4  . Anxiety   . Arthritis   . Breast cancer ([place])   . Chronic kidney disease   . Dementia ([place])   . Depression   . Dyspnea    W/ PAIN   . Gastroesophageal reflux disease   . Headache    TO SEE NEURO MD  . Heartburn   . Hypertension   . Hypothyroidism   . Leg cramps   . Post-menopause bleeding   . Restless leg syndrome 10/07/2014    Past Surgical History:  Procedure Laterality Date  . BREAST SURGERY Left    Lumpectomy   . CATARACT EXTRACTION, BILATERAL    . hysteroscopy biopsy      There were no vitals filed for this visit.      02/20/18 0807  Symptoms/Limitations  Subjective Was very tired after last session. Feeling better today.   Patient is accompained by: Family member  Patient Stated Goals To improve the strength in her back and relieve pain.  To improve dizziness.  To walk without cane.  Pain Assessment  Currently in Pain? No/denies  Pain Score 0        02/20/18 0834  Transfers  Transfers Sit to Stand;Stand to Sit  Ambulation/Gait  Ambulation/Gait Yes  Ambulation/Gait Assistance 5: Supervision;4: Min guard  Ambulation/Gait Assistance Details occasional toe scuffing with left foot, with cues for increased toe up (DF) no more episodes occured.   Ambulation Distance (Feet) 230 Feet  Assistive device None  Gait Pattern Step-through pattern;Decreased stride length;Decreased trunk rotation  Ambulation Surface Level;Indoor  Stairs Yes  Stairs Assistance 5: Supervision;4: Min guard  Stairs Assistance Details (indicate cue type and reason) cues on posture (not to lean fwd), weight shifting and hand advancement on rail   Stair Management Technique One rail Right;One rail Left;Alternating pattern;Forwards  Number of Stairs 4 (x3 reps)  Height of Stairs 6  Ramp 5: Supervision  Curb 5: Supervision  Exercises  Exercises Other Exercises  Other Exercises  pt performed theraband postural ex's from last session. issued to HEP.       PT Short Term Goals - 02/06/18 0803      PT SHORT TERM GOAL #1   Title  Pt will participate in and tolerate full vestibular assessment    Baseline  Not needed; dizziness  resolved by second visit    Time  5    Period  Weeks    Status  Deferred      PT SHORT TERM GOAL #2   Title  Pt will demonstrate independence with initial strengthening/postural/balance/vestibular HEP    Time  5    Period  Weeks    Status  Achieved      PT SHORT TERM GOAL #3   Title  Pt will improve LE strength to perform five time sit to stand without UE support to </= 15 seconds    Baseline  17 seconds  > declined to 25 seconds with UE support on arm chair    Time  5    Period  Weeks    Status  Not Met      PT SHORT TERM GOAL #4   Title  Pt will decrease falls risk as indicated by increase in BERG to >/= 50/56    Baseline  48/56 > 54/56    Time  5    Period  Weeks    Status  Achieved      PT SHORT TERM GOAL #5   Title   Pt will report 25% reduction in back soreness during activities in standing/puzzles/reading    Baseline  9/10 in standing currently > 50% improvement in pain 2 or 3/10 but has been performing in sitting - pt reports she has transitioned her activities to sitting due to back pain    Time  5    Period  Weeks    Status  Achieved        PT Long Term Goals - 01/02/18 1005      PT LONG TERM GOAL #1   Title  Pt will demonstrate independence with final HEP    Time  10    Period  Weeks    Status  New    Target Date  03/13/18      PT LONG TERM GOAL #2   Title  Pt will report 50% reduction in pain during standing activities/puzzles/reading    Time  10    Period  Weeks    Status  New    Target Date  03/13/18      PT LONG TERM GOAL #3   Title  Pt will demonstrate improved LE strength as indicated by five time sit to stand without UE use to </= 13 seconds    Time  10    Period  Weeks    Status  New    Target Date  03/13/18      PT LONG TERM GOAL #4   Title  Pt will decrease falls risk as indicated by BERG score >/= 53/56    Time  10    Period  Weeks    Status  New    Target Date  03/13/18      PT LONG TERM GOAL #5   Title  Pt will ambulate x 230' indoors, 100' outside over pavement MOD I without use of cane and will negotiate 12 stairs with one rail MOD I    Time  10    Period  Weeks    Status  New    Target Date  03/13/18      Additional Long Term Goals   Additional Long Term Goals  Yes      PT LONG TERM GOAL #6   Title  Vestibular goal if indicated  Plan - 02/20/18 1652    Clinical Impression Statement  Today's skilled session initially focused on review of theraband postural ex's with written instructions provided today. Remainder of session address gait/barries with emphais on posture/core stability. The pt is progressing toward goals and should benefit from continued PT to progress toward unmet goals.     Rehab Potential  Good    PT Frequency  2x / week     PT Duration  Other (comment)   10 weeks   PT Treatment/Interventions  ADLs/Self Care Home Management;Moist Heat;Aquatic Therapy;Canalith Repostioning;Cryotherapy;DME Instruction;Gait training;Stair training;Functional mobility training;Therapeutic activities;Therapeutic exercise;Balance training;Neuromuscular re-education;Patient/family education;Manual techniques;Passive range of motion;Dry needling;Vestibular;Taping    PT Next Visit Plan   Step ups and stair negotiation training.  Gait without cane training - standing with improved posture    Consulted and Agree with Plan of Care  Patient    Family Member Consulted  daughter       Patient will benefit from skilled therapeutic intervention in order to improve the following deficits and impairments:  Decreased balance, Decreased strength, Dizziness, Difficulty walking, Postural dysfunction, Pain  Visit Diagnosis: Chronic midline low back pain without sciatica  Difficulty in walking, not elsewhere classified  Abnormal posture  Muscle weakness (generalized)  Unsteadiness on feet     Problem List Patient Active Problem List   Diagnosis Date Noted  . Status post lumbar spinal fusion 08/26/2017  . Bilateral carotid artery stenosis 08/30/2016  . Chronic midline low back pain without sciatica 08/30/2016  . CKD (chronic kidney disease) stage 3, GFR 30-59 ml/min (HCC) 08/30/2016  . Palpitations 04/22/2016  . Left carotid bruit 04/22/2016  . Hypothyroidism   . Hypertension   . Heartburn   . Depression   . Dementia ([place])   . Breast cancer ([place])   . Muscle spasm of back 12/19/2014  . Urinary frequency 12/19/2014    Willow Ora, PTA, [facility] [address], [place], [place] [address] [phone] 02/21/18, 4:55 PM   Name: Regina Porter MRN: [id number] Date of Birth: 1934-01-22

## 2018-02-23 DIAGNOSIS — I129 Hypertensive chronic kidney disease with stage 1 through stage 4 chronic kidney disease, or unspecified chronic kidney disease: Secondary | ICD-10-CM | POA: Diagnosis not present

## 2018-02-23 DIAGNOSIS — N189 Chronic kidney disease, unspecified: Secondary | ICD-10-CM | POA: Diagnosis not present

## 2018-02-23 DIAGNOSIS — N2581 Secondary hyperparathyroidism of renal origin: Secondary | ICD-10-CM | POA: Diagnosis not present

## 2018-02-23 DIAGNOSIS — K219 Gastro-esophageal reflux disease without esophagitis: Secondary | ICD-10-CM | POA: Diagnosis not present

## 2018-02-23 DIAGNOSIS — I739 Peripheral vascular disease, unspecified: Secondary | ICD-10-CM | POA: Diagnosis not present

## 2018-02-23 DIAGNOSIS — G2581 Restless legs syndrome: Secondary | ICD-10-CM | POA: Diagnosis not present

## 2018-02-23 DIAGNOSIS — E039 Hypothyroidism, unspecified: Secondary | ICD-10-CM | POA: Diagnosis not present

## 2018-02-23 DIAGNOSIS — D631 Anemia in chronic kidney disease: Secondary | ICD-10-CM | POA: Diagnosis not present

## 2018-02-23 DIAGNOSIS — N184 Chronic kidney disease, stage 4 (severe): Secondary | ICD-10-CM | POA: Diagnosis not present

## 2018-02-23 DIAGNOSIS — F419 Anxiety disorder, unspecified: Secondary | ICD-10-CM | POA: Diagnosis not present

## 2018-02-23 DIAGNOSIS — C50919 Malignant neoplasm of unspecified site of unspecified female breast: Secondary | ICD-10-CM | POA: Diagnosis not present

## 2018-02-23 DIAGNOSIS — F329 Major depressive disorder, single episode, unspecified: Secondary | ICD-10-CM | POA: Diagnosis not present

## 2018-02-23 DIAGNOSIS — F0390 Unspecified dementia without behavioral disturbance: Secondary | ICD-10-CM | POA: Diagnosis not present

## 2018-02-25 ENCOUNTER — Ambulatory Visit: Payer: Medicare Other | Admitting: Physical Therapy

## 2018-02-25 ENCOUNTER — Encounter: Payer: Self-pay | Admitting: Physical Therapy

## 2018-02-25 DIAGNOSIS — R2681 Unsteadiness on feet: Secondary | ICD-10-CM

## 2018-02-25 DIAGNOSIS — M545 Low back pain, unspecified: Secondary | ICD-10-CM

## 2018-02-25 DIAGNOSIS — R262 Difficulty in walking, not elsewhere classified: Secondary | ICD-10-CM | POA: Diagnosis not present

## 2018-02-25 DIAGNOSIS — M6281 Muscle weakness (generalized): Secondary | ICD-10-CM

## 2018-02-25 DIAGNOSIS — R293 Abnormal posture: Secondary | ICD-10-CM

## 2018-02-25 DIAGNOSIS — G8929 Other chronic pain: Secondary | ICD-10-CM

## 2018-02-25 NOTE — Patient Instructions (Addendum)
Over Head Pull: Narrow Grip    On back, knees bent, feet flat, band across thighs, elbows straight but relaxed. Pull hands apart (start). Keeping elbows straight, bring arms up and over head, hands toward floor. Keep pull steady on band. Hold momentarily. Return slowly, keeping pull steady, back to start. Repeat _10__ times. Band color ___red___   Copyright  VHI. All rights reserved.  Over Head Pull: Wide Grip    On back, knees bent, feet flat, band across thighs, elbows straight but relaxed. Pull hands apart (start). Keeping elbows straight, bring arms up and over head, hands toward floor. Keep steady pull on band. Hold momentarily. Return slowly, keeping pull steady, back to start. Repeat __10_ times. Band color ___red___    Copyright  VHI. All rights reserved.  Side Pull: Double Arm    On back, knees bent, feet flat. Arms perpendicular to body, shoulder level, elbows straight but relaxed. Pull arms out to sides, elbows straight. Resistance band comes across collarbones, hands toward floor. Hold momentarily. Slowly return to starting position. Repeat _10__ times. Band color _red____   Elmer Picker    On back, knees bent, feet flat, left hand on left hip, right hand above left. Pull right arm DIAGONALLY (hip to shoulder) across chest. Bring right arm along head toward floor. Hold momentarily. Slowly return to starting position. Repeat __10_ times. Do with left arm. Band color ___red___   Copyright  VHI. All rights reserved.  Shoulder Rotation: Double Arm    On back, knees bent, feet flat, elbows tucked at sides, bent 90, hands palms up. Pull hands apart and down toward floor, keeping elbows near sides. Hold momentarily. Slowly return to starting position. Repeat __10_ times. Band color ___red__    Access Code: [email or web address]  URL: [email or web address]  Date: 02/25/2018  Prepared by: Misty Stanley   Exercises  Supine Figure 4 Piriformis Stretch - 2 reps - 30 second  hold - 2x daily - 7x weekly  Supine Lower Trunk Rotation - 10 reps - 1 sets - 2x daily - 7x weekly  Supine Bridge - 10 reps - 1 sets - 5 hold - 1x daily - 5x weekly  Hooklying Clamshell with Resistance - 10 reps - 1 sets - 5 hold - 1x daily - 5x weekly  Sit to Stand - 10 reps - 1 sets - 1x daily - 5x weekly  Romberg Stance Eyes Closed on Foam Pad - 3 reps - 1 sets - 3 hold - 1x daily - 5x weekly  Tandem Stance on Foam Pad with Eyes Open - 3 reps - 1 sets - 15 hold - 1x daily - 5x weekly

## 2018-02-25 NOTE — Therapy (Signed)
[facility] [address] [place], [place], [address] Phone: [phone]   Fax:  [phone]  Physical Therapy Treatment  Patient Details  Name: Regina Porter MRN: [id number] Date of Birth: 23-Jun-1934 Referring Provider (PT): [name], NP   Encounter Date: 02/25/2018  PT End of Session - 02/25/18 1330    Visit Number  11    Number of Visits  17    Date for PT Re-Evaluation  03/13/18    Authorization Type  Medicare - 10th visit PN.  VL: medicare guidelines    PT Start Time  1019    PT Stop Time  1104    PT Time Calculation (min)  45 min    Activity Tolerance  Patient tolerated treatment well    Behavior During Therapy  [facility] for tasks assessed/performed       Past Medical History:  Diagnosis Date  . Anemia   . Anterolisthesis    grade 1: L3-4  . Anxiety   . Arthritis   . Breast cancer ([place])   . Chronic kidney disease   . Dementia ([place])   . Depression   . Dyspnea    W/ PAIN   . Gastroesophageal reflux disease   . Headache    TO SEE NEURO MD  . Heartburn   . Hypertension   . Hypothyroidism   . Leg cramps   . Post-menopause bleeding   . Restless leg syndrome 10/07/2014    Past Surgical History:  Procedure Laterality Date  . BREAST SURGERY Left    Lumpectomy   . CATARACT EXTRACTION, BILATERAL    . hysteroscopy biopsy      There were no vitals filed for this visit.  Subjective Assessment - 02/25/18 1021    Subjective  Walking with more upright posture today.  This is third appointment she has not had her cane.  Feels an improvement in her walking.  Doing well with posture exercises.    Patient is accompained by:  Family member    Patient Stated Goals  To improve the strength in her back and relieve pain.  To improve dizziness.  To walk without cane.    Currently in Pain?  No/denies                       [facility] Adult PT Treatment/Exercise - 02/25/18 1040      Ambulation/Gait    Ambulation/Gait  Yes    Ambulation/Gait Assistance  5: Supervision    Ambulation/Gait Assistance Details  outside over uneven pavement, weaving around obstacles and performing multiple up/down curbs with supervision.  At end of walk pt noted to have increased L foot drag and LLE adduction.    Ambulation Distance (Feet)  500 Feet    Assistive device  None    Ambulation Surface  Unlevel;Outdoor;Paved    Stairs  Yes    Stairs Assistance  6: Modified independent (Device/Increase time);5: Supervision    Stairs Assistance Details (indicate cue type and reason)  MOD I when using one rail to negotiate 6" steps and 8" steps.  Supervision when performing on 6in steps without rail, slight catch of L heel on step when descending. Mild imbalance when standing/stabilizing on LLE    Stair Management Technique  One rail Right;One rail Left;No rails;Alternating pattern;Forwards    Number of Stairs  12    Height of Stairs  6   and 8   Curb  5: Supervision  Lumbar Exercises: Supine   Bridge  Non-compliant;5 reps    Bridge Limitations  discussed difficulty at home.  Pt performs on soft bed at home and feet tend to slip.  Recommended pt remove covers before performing and to wear grip socks and turn around so that feet are against head board to improve stability when performing      Shoulder Exercises: Supine   External Rotation  Strengthening;Both;10 reps;Theraband    Theraband Level (Shoulder External Rotation)  Level 2 (Red)    External Rotation Limitations  reviewed correct technique for shoulder ER with elbows tucked in and holding wrists in neutral.  Pt tends to extend wrists to start motion.          Balance Exercises - 02/25/18 1100      OTAGO PROGRAM   Heel Walking  Support   x 4 laps with UE support on counter top       PT Education - 02/25/18 1329    Education Details  adjustment to bridge exercise, reviewed postural exercises    Person(s) Educated  Patient;Child(ren)    Methods   Explanation;Demonstration;Handout    Comprehension  Verbalized understanding;Returned demonstration       PT Short Term Goals - 02/06/18 0803      PT SHORT TERM GOAL #1   Title  Pt will participate in and tolerate full vestibular assessment    Baseline  Not needed; dizziness resolved by second visit    Time  5    Period  Weeks    Status  Deferred      PT SHORT TERM GOAL #2   Title  Pt will demonstrate independence with initial strengthening/postural/balance/vestibular HEP    Time  5    Period  Weeks    Status  Achieved      PT SHORT TERM GOAL #3   Title  Pt will improve LE strength to perform five time sit to stand without UE support to </= 15 seconds    Baseline  17 seconds  > declined to 25 seconds with UE support on arm chair    Time  5    Period  Weeks    Status  Not Met      PT SHORT TERM GOAL #4   Title  Pt will decrease falls risk as indicated by increase in BERG to >/= 50/56    Baseline  48/56 > 54/56    Time  5    Period  Weeks    Status  Achieved      PT SHORT TERM GOAL #5   Title  Pt will report 25% reduction in back soreness during activities in standing/puzzles/reading    Baseline  9/10 in standing currently > 50% improvement in pain 2 or 3/10 but has been performing in sitting - pt reports she has transitioned her activities to sitting due to back pain    Time  5    Period  Weeks    Status  Achieved        PT Long Term Goals - 01/02/18 1005      PT LONG TERM GOAL #1   Title  Pt will demonstrate independence with final HEP    Time  10    Period  Weeks    Status  New    Target Date  03/13/18      PT LONG TERM GOAL #2   Title  Pt will report 50% reduction in pain during standing activities/puzzles/reading  Time  10    Period  Weeks    Status  New    Target Date  03/13/18      PT LONG TERM GOAL #3   Title  Pt will demonstrate improved LE strength as indicated by five time sit to stand without UE use to </= 13 seconds    Time  10    Period   Weeks    Status  New    Target Date  03/13/18      PT LONG TERM GOAL #4   Title  Pt will decrease falls risk as indicated by BERG score >/= 53/56    Time  10    Period  Weeks    Status  New    Target Date  03/13/18      PT LONG TERM GOAL #5   Title  Pt will ambulate x 230' indoors, 100' outside over pavement MOD I without use of cane and will negotiate 12 stairs with one rail MOD I    Time  10    Period  Weeks    Status  New    Target Date  03/13/18      Additional Long Term Goals   Additional Long Term Goals  Yes      PT LONG TERM GOAL #6   Title  Vestibular goal if indicated            Plan - 02/25/18 1330    Clinical Impression Statement  Continued to review and revise patient's HEP to ensure maximum effectiveness of exercises.  Also continued to assess pt's progress with stair negotiation and gait outside.  Pt continues to fatigue with gait over variety of surfaces resulting in increased L foot drag and adduction.  Will continue to address standing LE strengthening, balance and standing tolerance to progress towards LTG.  Pt is demonstrating improved gait with more upright posture and decreased reliance on AD.    Rehab Potential  Good    PT Frequency  2x / week    PT Duration  Other (comment)   10 weeks   PT Treatment/Interventions  ADLs/Self Care Home Management;Moist Heat;Aquatic Therapy;Canalith Repostioning;Cryotherapy;DME Instruction;Gait training;Stair training;Functional mobility training;Therapeutic activities;Therapeutic exercise;Balance training;Neuromuscular re-education;Patient/family education;Manual techniques;Passive range of motion;Dry needling;Vestibular;Taping    PT Next Visit Plan  Closed chain ankle DF training (heel walking/tandem gait/balance beam) and closed and open chain hip ABD strengthening especially on L.  Gait outside over pavement.  Forwards and lateral step ups for balance and strengthening    Consulted and Agree with Plan of Care  Patient     Family Member Consulted  daughter       Patient will benefit from skilled therapeutic intervention in order to improve the following deficits and impairments:  Decreased balance, Decreased strength, Dizziness, Difficulty walking, Postural dysfunction, Pain  Visit Diagnosis: Chronic midline low back pain without sciatica  Difficulty in walking, not elsewhere classified  Abnormal posture  Muscle weakness (generalized)  Unsteadiness on feet     Problem List Patient Active Problem List   Diagnosis Date Noted  . Status post lumbar spinal fusion 08/26/2017  . Bilateral carotid artery stenosis 08/30/2016  . Chronic midline low back pain without sciatica 08/30/2016  . CKD (chronic kidney disease) stage 3, GFR 30-59 ml/min (HCC) 08/30/2016  . Palpitations 04/22/2016  . Left carotid bruit 04/22/2016  . Hypothyroidism   . Hypertension   . Heartburn   . Depression   . Dementia ([place])   . Breast cancer (  [place])   . Muscle spasm of back 12/19/2014  . Urinary frequency 12/19/2014    Rico Junker, PT, DPT 02/25/18    1:38 PM    [facility] [address] [place], [place], [address] Phone: [phone]   Fax:  [phone]  Name: Regina Porter MRN: [id number] Date of Birth: September 07, 1934

## 2018-02-27 ENCOUNTER — Ambulatory Visit: Payer: Medicare Other | Admitting: Physical Therapy

## 2018-02-27 ENCOUNTER — Encounter: Payer: Self-pay | Admitting: Physical Therapy

## 2018-02-27 DIAGNOSIS — M545 Low back pain, unspecified: Secondary | ICD-10-CM

## 2018-02-27 DIAGNOSIS — G8929 Other chronic pain: Secondary | ICD-10-CM | POA: Diagnosis not present

## 2018-02-27 DIAGNOSIS — R293 Abnormal posture: Secondary | ICD-10-CM | POA: Diagnosis not present

## 2018-02-27 DIAGNOSIS — R262 Difficulty in walking, not elsewhere classified: Secondary | ICD-10-CM

## 2018-02-27 DIAGNOSIS — M6281 Muscle weakness (generalized): Secondary | ICD-10-CM | POA: Diagnosis not present

## 2018-02-27 DIAGNOSIS — R2681 Unsteadiness on feet: Secondary | ICD-10-CM | POA: Diagnosis not present

## 2018-02-27 NOTE — Therapy (Signed)
[facility] [address] [place], [place], [address] Phone: [phone]   Fax:  [phone]  Physical Therapy Treatment  Patient Details  Name: Regina Porter MRN: [id number] Date of Birth: Nov 29, 1934 Referring Provider (PT): [name], NP   Encounter Date: 02/27/2018  PT End of Session - 02/27/18 0809    Visit Number  12    Number of Visits  17    Date for PT Re-Evaluation  03/13/18    Authorization Type  Medicare - 10th visit PN.  VL: medicare guidelines    PT Start Time  0805    PT Stop Time  0845    PT Time Calculation (min)  40 min    Equipment Utilized During Treatment  Gait belt    Activity Tolerance  Patient tolerated treatment well    Behavior During Therapy  WFL for tasks assessed/performed       Past Medical History:  Diagnosis Date  . Anemia   . Anterolisthesis    grade 1: L3-4  . Anxiety   . Arthritis   . Breast cancer ([place])   . Chronic kidney disease   . Dementia ([place])   . Depression   . Dyspnea    W/ PAIN   . Gastroesophageal reflux disease   . Headache    TO SEE NEURO MD  . Heartburn   . Hypertension   . Hypothyroidism   . Leg cramps   . Post-menopause bleeding   . Restless leg syndrome 10/07/2014    Past Surgical History:  Procedure Laterality Date  . BREAST SURGERY Left    Lumpectomy   . CATARACT EXTRACTION, BILATERAL    . hysteroscopy biopsy      There were no vitals filed for this visit.  Subjective Assessment - 02/27/18 0807    Subjective  No new complaints. No falls. Posture continues to be improved with gait.     Patient is accompained by:  Family member    Patient Stated Goals  To improve the strength in her back and relieve pain.  To improve dizziness.  To walk without cane.    Currently in Pain?  Yes    Pain Score  2     Pain Location  Back    Pain Orientation  Lower;Mid    Pain Descriptors / Indicators  Sore    Pain Type  Chronic pain    Pain Onset  More  than a month ago    Pain Frequency  Constant    Aggravating Factors   poor positioning, changes in weather    Pain Relieving Factors  sitting, heating pads            [facility] Adult PT Treatment/Exercise - 02/27/18 0810      Transfers   Transfers  Sit to Stand;Stand to Sit    Sit to Stand  6: Modified independent (Device/Increase time)    Stand to Sit  6: Modified independent (Device/Increase time)      Ambulation/Gait   Ambulation/Gait  Yes    Ambulation/Gait Assistance  5: Supervision    Ambulation/Gait Assistance Details  no foot drag noted today with gait on in/outdoor surfaces.     Ambulation Distance (Feet)  500 Feet    Assistive device  None    Gait Pattern  Step-through pattern;Decreased stride length;Decreased trunk rotation    Ambulation Surface  Level;Unlevel;Indoor;Outdoor;Paved      High Level Balance   High Level Balance Activities  Tandem walking  toe walk, heel walk both fwd/bwd   High Level Balance Comments  next to counter top with single UE support: 3 laps each way with cues on form and technique.           Balance Exercises - 02/27/18 0823      Balance Exercises: Standing   Rockerboard  Anterior/posterior;Lateral;Head turns;EO;EC;30 seconds;10 reps;Intermittent UE support    Balance Beam  on blue foam beam: standing across beam-alternating fwd heel taps to floor/back onto beam, then alternating bwd toe taps to floor/back onto beam. cues on posture, step height and step lenght. no UE support with min guard to min assist for balance.      Tandem Gait  Forward;Retro;Intermittent upper extremity support;Foam/compliant surface;3 reps;Limitations    Sidestepping  Foam/compliant support;3 reps;Limitations      Balance Exercises: Standing   Rebounder Limitations  performed both ways on the balance board with no UE support to occasional touch to bars: rocking the board with empahsis on upright posture with EO progressing to EC, incr  assistance/UE support needed  with EC; holding the board steady- EC no head movements, progressing to EC head movements left<>right, then up<>down, min guard to min assist for balance with cues on posture and weight shifting for balance.                        Tandem Gait Limitations  on blue foam beam: cues on posture, step position/placement with light UE support on bars. min guard assist for balance.     Sidestepping Limitations  on blue foam beam- 3 laps each way with cues on posture, step length and step length.           PT Short Term Goals - 02/06/18 0803      PT SHORT TERM GOAL #1   Title  Pt will participate in and tolerate full vestibular assessment    Baseline  Not needed; dizziness resolved by second visit    Time  5    Period  Weeks    Status  Deferred      PT SHORT TERM GOAL #2   Title  Pt will demonstrate independence with initial strengthening/postural/balance/vestibular HEP    Time  5    Period  Weeks    Status  Achieved      PT SHORT TERM GOAL #3   Title  Pt will improve LE strength to perform five time sit to stand without UE support to </= 15 seconds    Baseline  17 seconds  > declined to 25 seconds with UE support on arm chair    Time  5    Period  Weeks    Status  Not Met      PT SHORT TERM GOAL #4   Title  Pt will decrease falls risk as indicated by increase in BERG to >/= 50/56    Baseline  48/56 > 54/56    Time  5    Period  Weeks    Status  Achieved      PT SHORT TERM GOAL #5   Title  Pt will report 25% reduction in back soreness during activities in standing/puzzles/reading    Baseline  9/10 in standing currently > 50% improvement in pain 2 or 3/10 but has been performing in sitting - pt reports she has transitioned her activities to sitting due to back pain    Time  5    Period  Weeks  Status  Achieved        PT Long Term Goals - 01/02/18 1005      PT LONG TERM GOAL #1   Title  Pt will demonstrate independence with final HEP    Time  10    Period  Weeks    Status   New    Target Date  03/13/18      PT LONG TERM GOAL #2   Title  Pt will report 50% reduction in pain during standing activities/puzzles/reading    Time  10    Period  Weeks    Status  New    Target Date  03/13/18      PT LONG TERM GOAL #3   Title  Pt will demonstrate improved LE strength as indicated by five time sit to stand without UE use to </= 13 seconds    Time  10    Period  Weeks    Status  New    Target Date  03/13/18      PT LONG TERM GOAL #4   Title  Pt will decrease falls risk as indicated by BERG score >/= 53/56    Time  10    Period  Weeks    Status  New    Target Date  03/13/18      PT LONG TERM GOAL #5   Title  Pt will ambulate x 230' indoors, 100' outside over pavement MOD I without use of cane and will negotiate 12 stairs with one rail MOD I    Time  10    Period  Weeks    Status  New    Target Date  03/13/18      Additional Long Term Goals   Additional Long Term Goals  Yes      PT LONG TERM GOAL #6   Title  Vestibular goal if indicated            Plan - 02/27/18 0809    Clinical Impression Statement  Today's skilled session focused on gait on various surfaces and balance reactions with no issues reported or noted in session. The pt did fatigue with activity, needing rest breaks throughout session. The pt is progressing toward goals with improved posture noted with gait and decreased foot drag noted in session today (only one episode when pt was walking out of gym at end of session). The pt is progressing toward goals and should benefit from continued PT to progress toward unmet goals.     Rehab Potential  Good    PT Frequency  2x / week    PT Duration  Other (comment)   10 weeks   PT Treatment/Interventions  ADLs/Self Care Home Management;Moist Heat;Aquatic Therapy;Canalith Repostioning;Cryotherapy;DME Instruction;Gait training;Stair training;Functional mobility training;Therapeutic activities;Therapeutic exercise;Balance training;Neuromuscular  re-education;Patient/family education;Manual techniques;Passive range of motion;Dry needling;Taping    PT Next Visit Plan  continue to work on LE strengthening (especially DF), balance reactions on compliant surfaces, strengthening of LE's with step ups/downs fwd and lateral.    Consulted and Agree with Plan of Care  Patient    Family Member Consulted  daughter       Patient will benefit from skilled therapeutic intervention in order to improve the following deficits and impairments:  Decreased balance, Decreased strength, Dizziness, Difficulty walking, Postural dysfunction, Pain  Visit Diagnosis: Chronic midline low back pain without sciatica  Difficulty in walking, not elsewhere classified  Abnormal posture  Muscle weakness (generalized)     Problem List Patient Active  Problem List   Diagnosis Date Noted  . Status post lumbar spinal fusion 08/26/2017  . Bilateral carotid artery stenosis 08/30/2016  . Chronic midline low back pain without sciatica 08/30/2016  . CKD (chronic kidney disease) stage 3, GFR 30-59 ml/min (HCC) 08/30/2016  . Palpitations 04/22/2016  . Left carotid bruit 04/22/2016  . Hypothyroidism   . Hypertension   . Heartburn   . Depression   . Dementia ([place])   . Breast cancer ([place])   . Muscle spasm of back 12/19/2014  . Urinary frequency 12/19/2014    Willow Ora, PTA, [facility] [address], [place]  [place], [place] [address] [phone] 02/27/18, 9:57 PM   Name: Regina Porter MRN: [id number] Date of Birth: [birth year]

## 2018-03-04 ENCOUNTER — Encounter: Payer: Self-pay | Admitting: Physical Therapy

## 2018-03-04 ENCOUNTER — Ambulatory Visit: Payer: Medicare Other | Admitting: Physical Therapy

## 2018-03-04 DIAGNOSIS — G8929 Other chronic pain: Secondary | ICD-10-CM | POA: Diagnosis not present

## 2018-03-04 DIAGNOSIS — R2681 Unsteadiness on feet: Secondary | ICD-10-CM | POA: Diagnosis not present

## 2018-03-04 DIAGNOSIS — R293 Abnormal posture: Secondary | ICD-10-CM

## 2018-03-04 DIAGNOSIS — M545 Low back pain, unspecified: Secondary | ICD-10-CM

## 2018-03-04 DIAGNOSIS — R262 Difficulty in walking, not elsewhere classified: Secondary | ICD-10-CM | POA: Diagnosis not present

## 2018-03-04 DIAGNOSIS — M6281 Muscle weakness (generalized): Secondary | ICD-10-CM | POA: Diagnosis not present

## 2018-03-04 NOTE — Patient Instructions (Signed)
ADD LONG TOWEL ROLL ALONG UPPER SPINE WHEN PERFORMING THESE EXERCISES  Over Head Pull: Narrow Grip    On back, knees bent, feet flat, band across thighs, elbows straight but relaxed. Pull hands apart (start). Keeping elbows straight, bring arms up and over head, hands toward floor. Keep pull steady on band. Hold momentarily. Return slowly, keeping pull steady, back to start. Repeat _10__ times. Band color __GREEN___    Side Pull: Double Arm    On back, knees bent, feet flat. Arms perpendicular to body, shoulder level, elbows straight but relaxed. Pull arms out to sides, elbows straight. Resistance band comes across collarbones, hands toward floor. Hold momentarily. Slowly return to starting position. Repeat _10_ times. Band color _GREEN____     Sash    On back, knees bent, feet flat, left hand on left hip, right hand above left. Pull right arm DIAGONALLY (hip to shoulder) across chest. Bring right arm along head toward floor. Hold momentarily. Slowly return to starting position. Repeat _10__ times. Do with left arm. Band color _GREEN___   Copyright  VHI. All rights reserved.  Shoulder Rotation: Double Arm    On back, knees bent, feet flat, elbows tucked at sides, bent 90, hands palms up. Pull hands apart and down toward floor, keeping elbows near sides. Hold momentarily. Slowly return to starting position. Repeat 10 times. Band color _GREEN_____

## 2018-03-04 NOTE — Therapy (Signed)
[facility] [address] [place], [place], [address] Phone: [phone]   Fax:  [phone]  Physical Therapy Treatment  Patient Details  Name: Regina Porter MRN: [id number] Date of Birth: 10/26/1934 Referring Provider (PT): [name], NP   Encounter Date: 03/04/2018  PT End of Session - 03/04/18 1300    Visit Number  13    Number of Visits  17    Date for PT Re-Evaluation  03/13/18    Authorization Type  Medicare - 10th visit PN.  VL: medicare guidelines    PT Start Time  1020    PT Stop Time  1103    PT Time Calculation (min)  43 min    Activity Tolerance  Patient tolerated treatment well    Behavior During Therapy  WFL for tasks assessed/performed       Past Medical History:  Diagnosis Date  . Anemia   . Anterolisthesis    grade 1: L3-4  . Anxiety   . Arthritis   . Breast cancer ([place])   . Chronic kidney disease   . Dementia ([place])   . Depression   . Dyspnea    W/ PAIN   . Gastroesophageal reflux disease   . Headache    TO SEE NEURO MD  . Heartburn   . Hypertension   . Hypothyroidism   . Leg cramps   . Post-menopause bleeding   . Restless leg syndrome 10/07/2014    Past Surgical History:  Procedure Laterality Date  . BREAST SURGERY Left    Lumpectomy   . CATARACT EXTRACTION, BILATERAL    . hysteroscopy biopsy      There were no vitals filed for this visit.  Subjective Assessment - 03/04/18 1024    Subjective  Still not ambulating with cane.  Still has some discomfort across mid back.  Bridge exercise is going better.    Patient is accompained by:  Family member    Patient Stated Goals  To improve the strength in her back and relieve pain.  To improve dizziness.  To walk without cane.    Currently in Pain?  Yes    Pain Score  2     Pain Location  Back    Pain Orientation  Mid    Pain Descriptors / Indicators  Sore    Pain Type  Chronic pain    Pain Onset  More than a month ago                       [facility] Adult PT Treatment/Exercise - 03/04/18 1038      Knee/Hip Exercises: Standing   Step Down  Right;Left;1 set;10 reps;Hand Hold: 0   ON ROCKERBOARD - ANTERIOR/POSTERIOR     Shoulder Exercises: Supine   Horizontal ABduction  Strengthening;Both;10 reps;Theraband    Theraband Level (Shoulder Horizontal ABduction)  Level 2 (Red);Level 3 (Green)    Horizontal ABduction Limitations  with towel rolled along spine for increased thoracic extension    External Rotation  Strengthening;Both;10 reps;Theraband    Theraband Level (Shoulder External Rotation)  Level 3 (Green)    Flexion  Strengthening;Both;10 reps;Theraband    Theraband Level (Shoulder Flexion)  Level 2 (Red);Level 3 (Green)    Flexion Limitations  with towel rolled along spine for increased thoracic extension    Diagonals  Strengthening;Right;Left;10 reps;Theraband    Theraband Level (Shoulder Diagonals)  Level 2 (Red);Level 3 (Green)    Diagonals Weight (lbs)  with  towel rolled along spine for increased thoracic extension             PT Education - 03/04/18 1300    Education Details  upgraded postural exercises; plan for next 3 visits and D/C end of next week    Person(s) Educated  Patient;Child(ren)    Methods  Explanation;Demonstration;Handout    Comprehension  Verbalized understanding;Returned demonstration       PT Short Term Goals - 02/06/18 0803      PT SHORT TERM GOAL #1   Title  Pt will participate in and tolerate full vestibular assessment    Baseline  Not needed; dizziness resolved by second visit    Time  5    Period  Weeks    Status  Deferred      PT SHORT TERM GOAL #2   Title  Pt will demonstrate independence with initial strengthening/postural/balance/vestibular HEP    Time  5    Period  Weeks    Status  Achieved      PT SHORT TERM GOAL #3   Title  Pt will improve LE strength to perform five time sit to stand without UE support to </= 15 seconds    Baseline   17 seconds  > declined to 25 seconds with UE support on arm chair    Time  5    Period  Weeks    Status  Not Met      PT SHORT TERM GOAL #4   Title  Pt will decrease falls risk as indicated by increase in BERG to >/= 50/56    Baseline  48/56 > 54/56    Time  5    Period  Weeks    Status  Achieved      PT SHORT TERM GOAL #5   Title  Pt will report 25% reduction in back soreness during activities in standing/puzzles/reading    Baseline  9/10 in standing currently > 50% improvement in pain 2 or 3/10 but has been performing in sitting - pt reports she has transitioned her activities to sitting due to back pain    Time  5    Period  Weeks    Status  Achieved        PT Long Term Goals - 01/02/18 1005      PT LONG TERM GOAL #1   Title  Pt will demonstrate independence with final HEP    Time  10    Period  Weeks    Status  New    Target Date  03/13/18      PT LONG TERM GOAL #2   Title  Pt will report 50% reduction in pain during standing activities/puzzles/reading    Time  10    Period  Weeks    Status  New    Target Date  03/13/18      PT LONG TERM GOAL #3   Title  Pt will demonstrate improved LE strength as indicated by five time sit to stand without UE use to </= 13 seconds    Time  10    Period  Weeks    Status  New    Target Date  03/13/18      PT LONG TERM GOAL #4   Title  Pt will decrease falls risk as indicated by BERG score >/= 53/56    Time  10    Period  Weeks    Status  New    Target Date  03/13/18  PT LONG TERM GOAL #5   Title  Pt will ambulate x 230' indoors, 100' outside over pavement MOD I without use of cane and will negotiate 12 stairs with one rail MOD I    Time  10    Period  Weeks    Status  New    Target Date  03/13/18      Additional Long Term Goals   Additional Long Term Goals  Yes      PT LONG TERM GOAL #6   Title  Vestibular goal if indicated            Plan - 03/04/18 1302    Clinical Impression Statement  Treatment  session focused on upgrading postural exercises due to pt progress and improvement in posture.  Added towel roll along spine to gently facilitate increased thoracic extension and upgraded to green theraband for increased resistance and strengthening.  Pt tolerated well.  Continued to perform step down training on more unstable surface today.  Will continue to address and progress towards LTG.    Rehab Potential  Good    PT Frequency  2x / week    PT Duration  Other (comment)   10 weeks   PT Treatment/Interventions  ADLs/Self Care Home Management;Moist Heat;Aquatic Therapy;Canalith Repostioning;Cryotherapy;DME Instruction;Gait training;Stair training;Functional mobility training;Therapeutic activities;Therapeutic exercise;Balance training;Neuromuscular re-education;Patient/family education;Manual techniques;Passive range of motion;Dry needling;Taping    PT Next Visit Plan  step downs on rockerboard; stair negotiation.  Next week check LTG and finalize HEP - plan to D/C next week    Consulted and Agree with Plan of Care  Patient    Family Member Consulted  daughter       Patient will benefit from skilled therapeutic intervention in order to improve the following deficits and impairments:  Decreased balance, Decreased strength, Dizziness, Difficulty walking, Postural dysfunction, Pain  Visit Diagnosis: Chronic midline low back pain without sciatica  Difficulty in walking, not elsewhere classified  Abnormal posture  Muscle weakness (generalized)  Unsteadiness on feet     Problem List Patient Active Problem List   Diagnosis Date Noted  . Status post lumbar spinal fusion 08/26/2017  . Bilateral carotid artery stenosis 08/30/2016  . Chronic midline low back pain without sciatica 08/30/2016  . CKD (chronic kidney disease) stage 3, GFR 30-59 ml/min (HCC) 08/30/2016  . Palpitations 04/22/2016  . Left carotid bruit 04/22/2016  . Hypothyroidism   . Hypertension   . Heartburn   .  Depression   . Dementia ([place])   . Breast cancer ([place])   . Muscle spasm of back 12/19/2014  . Urinary frequency 12/19/2014    Rico Junker, PT, DPT 03/04/18    1:06 PM    [facility] [address] [place], [place], [address] Phone: [phone]   Fax:  [phone]  Name: Regina Porter MRN: [id number] Date of Birth: 1934-10-21

## 2018-03-06 ENCOUNTER — Ambulatory Visit: Payer: Medicare Other | Admitting: Physical Therapy

## 2018-03-11 ENCOUNTER — Encounter: Payer: Self-pay | Admitting: Physical Therapy

## 2018-03-11 ENCOUNTER — Ambulatory Visit: Payer: Medicare Other | Admitting: Physical Therapy

## 2018-03-11 DIAGNOSIS — M6281 Muscle weakness (generalized): Secondary | ICD-10-CM | POA: Diagnosis not present

## 2018-03-11 DIAGNOSIS — M545 Low back pain, unspecified: Secondary | ICD-10-CM

## 2018-03-11 DIAGNOSIS — Z961 Presence of intraocular lens: Secondary | ICD-10-CM | POA: Diagnosis not present

## 2018-03-11 DIAGNOSIS — R42 Dizziness and giddiness: Secondary | ICD-10-CM

## 2018-03-11 DIAGNOSIS — R262 Difficulty in walking, not elsewhere classified: Secondary | ICD-10-CM | POA: Diagnosis not present

## 2018-03-11 DIAGNOSIS — R293 Abnormal posture: Secondary | ICD-10-CM

## 2018-03-11 DIAGNOSIS — G8929 Other chronic pain: Secondary | ICD-10-CM | POA: Diagnosis not present

## 2018-03-11 DIAGNOSIS — R2681 Unsteadiness on feet: Secondary | ICD-10-CM | POA: Diagnosis not present

## 2018-03-11 DIAGNOSIS — H04123 Dry eye syndrome of bilateral lacrimal glands: Secondary | ICD-10-CM | POA: Diagnosis not present

## 2018-03-11 NOTE — Patient Instructions (Signed)
Due to discomfort - will no longer use towel roll along spine.    Over Head Pull: Narrow Grip    On back, knees bent, feet flat, band across thighs, elbows straight but relaxed. Pull hands apart (start). Keeping elbows straight, bring arms up and over head, hands toward floor. Keep pull steady on band. Hold momentarily. Return slowly, keeping pull steady, back to start. Repeat _10__ times. Band color __GREEN___    Side Pull: Double Arm    On back, knees bent, feet flat. Arms perpendicular to body, shoulder level, elbows straight but relaxed. Pull arms out to sides, elbows straight. Resistance band comes across collarbones, hands toward floor. Hold momentarily. Slowly return to starting position. Repeat _10_ times. Band color _GREEN____     Sash    On back, knees bent, feet flat, left hand on left hip, right hand above left. Pull right arm DIAGONALLY (hip to shoulder) across chest. Bring right arm along head toward floor. Hold momentarily. Slowly return to starting position. Repeat _10__ times. Do with left arm. Band color _GREEN___   Copyright  VHI. All rights reserved.  Shoulder Rotation: Double Arm    On back, knees bent, feet flat, elbows tucked at sides, bent 90, hands palms up. Pull hands apart and down toward floor, keeping elbows near sides. Hold momentarily. Slowly return to starting position. Repeat 10 times. Band color _GREEN_____

## 2018-03-11 NOTE — Therapy (Signed)
[facility] [address] [place], [place], [address] Phone: [phone]   Fax:  [phone]  Physical Therapy Treatment  Patient Details  Name: Regina Porter MRN: [id number] Date of Birth: 1934/07/20 Referring Provider (PT): [name], NP   Encounter Date: 03/11/2018  PT End of Session - 03/11/18 1327    Visit Number  14    Number of Visits  17    Date for PT Re-Evaluation  03/13/18    Authorization Type  Medicare - 10th visit PN.  VL: medicare guidelines    PT Start Time  1025    PT Stop Time  1103    PT Time Calculation (min)  38 min    Activity Tolerance  Patient tolerated treatment well    Behavior During Therapy  WFL for tasks assessed/performed       Past Medical History:  Diagnosis Date  . Anemia   . Anterolisthesis    grade 1: L3-4  . Anxiety   . Arthritis   . Breast cancer ([place])   . Chronic kidney disease   . Dementia ([place])   . Depression   . Dyspnea    W/ PAIN   . Gastroesophageal reflux disease   . Headache    TO SEE NEURO MD  . Heartburn   . Hypertension   . Hypothyroidism   . Leg cramps   . Post-menopause bleeding   . Restless leg syndrome 10/07/2014    Past Surgical History:  Procedure Laterality Date  . BREAST SURGERY Left    Lumpectomy   . CATARACT EXTRACTION, BILATERAL    . hysteroscopy biopsy      There were no vitals filed for this visit.  Subjective Assessment - 03/11/18 1034    Subjective  "I was miserable after I left here the last time" pt has not incorporated towel roll along spine due to discomfort she had last session.  Will remove from HEP.  Pt had an episode of dizziness on Thursday night, would like to address that today.      Patient is accompained by:  Family member    Patient Stated Goals  To improve the strength in her back and relieve pain.  To improve dizziness.  To walk without cane.    Currently in Pain?  Yes    Pain Score  2     Pain Location   Back    Pain Orientation  Mid    Pain Descriptors / Indicators  Sore;Discomfort    Pain Type  Chronic pain    Pain Onset  More than a month ago             Vestibular Assessment - 03/11/18 1035      Vestibular Assessment   General Observation  Had another episode of dizziness Thursday night.  Pt was asleep, when she woke up everything was moving.  Woke up on her back; during the night she rolls from one side to the other      Symptom Behavior   Type of Dizziness  Spinning    Frequency of Dizziness  intermittent, wakes up with it    Duration of Dizziness  5-10 minutes    Aggravating Factors  Spontaneous onset;Lying supine    Relieving Factors  Comments   sitting at the edge of the bed     Occulomotor Exam   Occulomotor Alignment  Normal    Spontaneous  Absent    Gaze-induced  Absent  Smooth Pursuits  Intact    Saccades  Intact    Comment  Convergence intact      Vestibulo-Occular Reflex   VOR Cancellation  Normal    Comment  HIT: + to R, - to L      Positional Testing   Dix-Hallpike  Dix-Hallpike Right;Dix-Hallpike Left    Horizontal Canal Testing  Horizontal Canal Right;Horizontal Canal Left      Dix-Hallpike Right   Dix-Hallpike Right Duration  0    Dix-Hallpike Right Symptoms  No nystagmus      Dix-Hallpike Left   Dix-Hallpike Left Duration  0    Dix-Hallpike Left Symptoms  No nystagmus      Horizontal Canal Right   Horizontal Canal Right Duration  0    Horizontal Canal Right Symptoms  Normal      Horizontal Canal Left   Horizontal Canal Left Duration  0    Horizontal Canal Left Symptoms  Normal   felt wooziness in head after testing     Positional Sensitivities   Sit to Supine  No dizziness    Supine to Left Side  No dizziness    Supine to Right Side  No dizziness    Supine to Sitting  No dizziness    Right Hallpike  No dizziness    Up from Right Hallpike  No dizziness    Up from Left Hallpike  No dizziness    Rolling Right  No dizziness     Rolling Left  No dizziness                       PT Education - 03/11/18 1327    Education Details  Differential diagnosis for dizziness upon waking: BPPV vs. panic attack vs. apnea vs. hypofunction and vestibular system acclimating to movement when first waking up.   vestibular assessment, added one more visit to make up for missed visit last week and to address dizziness.  Changes to HEP: no longer use towel roll along spine    Person(s) Educated  Patient;Child(ren)    Methods  Explanation    Comprehension  Verbalized understanding       PT Short Term Goals - 02/06/18 0803      PT SHORT TERM GOAL #1   Title  Pt will participate in and tolerate full vestibular assessment    Baseline  Not needed; dizziness resolved by second visit    Time  5    Period  Weeks    Status  Deferred      PT SHORT TERM GOAL #2   Title  Pt will demonstrate independence with initial strengthening/postural/balance/vestibular HEP    Time  5    Period  Weeks    Status  Achieved      PT SHORT TERM GOAL #3   Title  Pt will improve LE strength to perform five time sit to stand without UE support to </= 15 seconds    Baseline  17 seconds  > declined to 25 seconds with UE support on arm chair    Time  5    Period  Weeks    Status  Not Met      PT SHORT TERM GOAL #4   Title  Pt will decrease falls risk as indicated by increase in BERG to >/= 50/56    Baseline  48/56 > 54/56    Time  5    Period  Weeks    Status  Achieved  PT SHORT TERM GOAL #5   Title  Pt will report 25% reduction in back soreness during activities in standing/puzzles/reading    Baseline  9/10 in standing currently > 50% improvement in pain 2 or 3/10 but has been performing in sitting - pt reports she has transitioned her activities to sitting due to back pain    Time  5    Period  Weeks    Status  Achieved        PT Long Term Goals - 01/02/18 1005      PT LONG TERM GOAL #1   Title  Pt will demonstrate  independence with final HEP    Time  10    Period  Weeks    Status  New    Target Date  03/13/18      PT LONG TERM GOAL #2   Title  Pt will report 50% reduction in pain during standing activities/puzzles/reading    Time  10    Period  Weeks    Status  New    Target Date  03/13/18      PT LONG TERM GOAL #3   Title  Pt will demonstrate improved LE strength as indicated by five time sit to stand without UE use to </= 13 seconds    Time  10    Period  Weeks    Status  New    Target Date  03/13/18      PT LONG TERM GOAL #4   Title  Pt will decrease falls risk as indicated by BERG score >/= 53/56    Time  10    Period  Weeks    Status  New    Target Date  03/13/18      PT LONG TERM GOAL #5   Title  Pt will ambulate x 230' indoors, 100' outside over pavement MOD I without use of cane and will negotiate 12 stairs with one rail MOD I    Time  10    Period  Weeks    Status  New    Target Date  03/13/18      Additional Long Term Goals   Additional Long Term Goals  Yes      PT LONG TERM GOAL #6   Title  Vestibular goal if indicated            Plan - 03/11/18 1328    Clinical Impression Statement  Due to pt reporting a return of dizziness, added one more visit (and to make up for missed visit due to weather) and initiated vestibular assessment.  Pt did not demonstrate true vertigo or nystagmus during positional testing but did demonstrate mild delay in VOR when tested with HIT.  Pt does not report significant dizziness with head movement or ambulation but c/o dizziness when she first wakes up.  Pt also reports a mild panic when she wakes up as well.  Will continue to assess at next visit to determine if patient's symptoms are vestibular in nature or will require further work up by physician.    Rehab Potential  Good    PT Frequency  2x / week    PT Duration  Other (comment)   10 weeks   PT Treatment/Interventions  ADLs/Self Care Home Management;Moist Heat;Aquatic  Therapy;Canalith Repostioning;Cryotherapy;DME Instruction;Gait training;Stair training;Functional mobility training;Therapeutic activities;Therapeutic exercise;Balance training;Neuromuscular re-education;Patient/family education;Manual techniques;Passive range of motion;Dry needling;Taping    PT Next Visit Plan  Finish vestibular assessment; provide vestibular HEP if needed.  Check  LTG and finalize HEP.  Send to me on 3/4 for recert/D/C.      Consulted and Agree with Plan of Care  Patient    Family Member Consulted  daughter       Patient will benefit from skilled therapeutic intervention in order to improve the following deficits and impairments:  Decreased balance, Decreased strength, Dizziness, Difficulty walking, Postural dysfunction, Pain  Visit Diagnosis: Chronic midline low back pain without sciatica  Difficulty in walking, not elsewhere classified  Abnormal posture  Muscle weakness (generalized)  Unsteadiness on feet  Dizziness and giddiness     Problem List Patient Active Problem List   Diagnosis Date Noted  . Status post lumbar spinal fusion 08/26/2017  . Bilateral carotid artery stenosis 08/30/2016  . Chronic midline low back pain without sciatica 08/30/2016  . CKD (chronic kidney disease) stage 3, GFR 30-59 ml/min (HCC) 08/30/2016  . Palpitations 04/22/2016  . Left carotid bruit 04/22/2016  . Hypothyroidism   . Hypertension   . Heartburn   . Depression   . Dementia ([place])   . Breast cancer ([place])   . Muscle spasm of back 12/19/2014  . Urinary frequency 12/19/2014   Rico Junker, PT, DPT 03/11/18    1:35 PM    [facility] [address] [place], [place], [address] Phone: [phone]   Fax:  [phone]  Name: Regina Porter MRN: [id number] Date of Birth: 29-May-1934

## 2018-03-13 ENCOUNTER — Encounter: Payer: Self-pay | Admitting: Physical Therapy

## 2018-03-13 ENCOUNTER — Ambulatory Visit: Payer: Medicare Other | Admitting: Physical Therapy

## 2018-03-13 DIAGNOSIS — M545 Low back pain, unspecified: Secondary | ICD-10-CM

## 2018-03-13 DIAGNOSIS — G8929 Other chronic pain: Secondary | ICD-10-CM | POA: Diagnosis not present

## 2018-03-13 DIAGNOSIS — R262 Difficulty in walking, not elsewhere classified: Secondary | ICD-10-CM | POA: Diagnosis not present

## 2018-03-13 DIAGNOSIS — R2681 Unsteadiness on feet: Secondary | ICD-10-CM

## 2018-03-13 DIAGNOSIS — M6281 Muscle weakness (generalized): Secondary | ICD-10-CM | POA: Diagnosis not present

## 2018-03-13 DIAGNOSIS — R293 Abnormal posture: Secondary | ICD-10-CM

## 2018-03-13 DIAGNOSIS — R42 Dizziness and giddiness: Secondary | ICD-10-CM

## 2018-03-13 NOTE — Therapy (Signed)
[facility] [address] [place], [place], [address] Phone: [phone]   Fax:  [phone]  Physical Therapy Treatment  Patient Details  Name: Regina Porter MRN: [id number] Date of Birth: 08-08-1934 Referring Provider (PT): [name], NP   Encounter Date: 03/13/2018  PT End of Session - 03/13/18 1052    Visit Number  15    Number of Visits  17    Date for PT Re-Evaluation  03/13/18    Authorization Type  Medicare - 10th visit PN.  VL: medicare guidelines    PT Start Time  0805    PT Stop Time  0848    PT Time Calculation (min)  43 min    Activity Tolerance  Patient tolerated treatment well    Behavior During Therapy  WFL for tasks assessed/performed       Past Medical History:  Diagnosis Date  . Anemia   . Anterolisthesis    grade 1: L3-4  . Anxiety   . Arthritis   . Breast cancer ([place])   . Chronic kidney disease   . Dementia ([place])   . Depression   . Dyspnea    W/ PAIN   . Gastroesophageal reflux disease   . Headache    TO SEE NEURO MD  . Heartburn   . Hypertension   . Hypothyroidism   . Leg cramps   . Post-menopause bleeding   . Restless leg syndrome 10/07/2014    Past Surgical History:  Procedure Laterality Date  . BREAST SURGERY Left    Lumpectomy   . CATARACT EXTRACTION, BILATERAL    . hysteroscopy biopsy      There were no vitals filed for this visit.  Subjective Assessment - 03/13/18 0805    Subjective  Pt's head felt a little "crazy" after last session.  Pt points to the top of her head and says, "I think this is where my problem is coming from, it's where I feel the most misery."  Denies headache or pressure.  No dizzy spells since last visit.    Patient is accompained by:  Family member    Patient Stated Goals  To improve the strength in her back and relieve pain.  To improve dizziness.  To walk without cane.    Currently in Pain?  No/denies    Pain Onset  More than a month  ago         [facility] PT Assessment - 03/13/18 0830      Standardized Balance Assessment   Standardized Balance Assessment  Berg Balance Test;Five Times Sit to Stand    Five times sit to stand comments   13.88 seconds from mat, no UE support      Berg Balance Test   Sit to Stand  Able to stand without using hands and stabilize independently    Standing Unsupported  Able to stand safely 2 minutes    Sitting with Back Unsupported but Feet Supported on Floor or Stool  Able to sit safely and securely 2 minutes    Stand to Sit  Sits safely with minimal use of hands    Transfers  Able to transfer safely, minor use of hands    Standing Unsupported with Eyes Closed  Able to stand 10 seconds safely    Standing Ubsupported with Feet Together  Able to place feet together independently and stand 1 minute safely    From Standing, Reach Forward with Outstretched Arm  Can reach confidently >25  cm (10")    From Standing Position, Pick up Object from [place] to pick up shoe safely and easily    From Standing Position, Turn to Look Behind Over each Shoulder  Looks behind from both sides and weight shifts well    Turn 360 Degrees  Able to turn 360 degrees safely in 4 seconds or less    Standing Unsupported, Alternately Place Feet on Step/Stool  Able to stand independently and safely and complete 8 steps in 20 seconds    Standing Unsupported, One Foot in [place] to place foot tandem independently and hold 30 seconds    Standing on One Leg  Able to lift leg independently and hold > 10 seconds    Total Score  56    Berg comment:  56/56         Vestibular Assessment - 03/13/18 0812      Vestibular Assessment   General Observation  No episodes of dizziness since 2/26      Positional Sensitivities   Sit to Supine  Lightheadedness    Supine to Left Side  No dizziness    Supine to Right Side  No dizziness    Supine to Sitting  No dizziness    Right Hallpike  No dizziness    Up from Right  Hallpike  No dizziness    Up from Left Hallpike  No dizziness    Nose to Right Knee  No dizziness    Right Knee to Sitting  No dizziness    Nose to Left Knee  No dizziness    Left Knee to Sitting  Lightheadedness    Head Turning x 5  Lightheadedness    Head Nodding x 5  Lightheadedness    Pivot Right in Standing  No dizziness    Pivot Left in Standing  No dizziness    Rolling Right  No dizziness    Rolling Left  No dizziness      Orthostatics   BP supine (x 5 minutes)  163/69    HR supine (x 5 minutes)  66    BP sitting  156/64    HR sitting  66    BP standing (after 1 minute)  135/65    HR standing (after 1 minute)  69    Orthostatics Comment  no symptoms during position changes                Vestibular Treatment/Exercise - 03/13/18 0844      Vestibular Treatment/Exercise   Vestibular Treatment Provided  Gaze    Gaze Exercises  X1 Viewing Horizontal;X1 Viewing Vertical      X1 Viewing Horizontal   Foot Position  seated    Reps  1    Comments  30 seconds      X1 Viewing Vertical   Foot Position  seated    Reps  1    Comments  30 seconds            PT Education - 03/13/18 1051    Education Details  finished vestibular assessment, provided pt with gaze adaptation in sitting, plan for final visit.  Daughter asking about a stair lift: advised that pt continue to do stairs with rail to maintain strength    Person(s) Educated  Patient;Child(ren)    Methods  Explanation;Demonstration;Handout    Comprehension  Verbalized understanding;Returned demonstration       PT Short Term Goals - 03/13/18 2694      PT SHORT  TERM GOAL #1   Title  Pt will participate in and tolerate full vestibular assessment    Baseline  Not needed; dizziness resolved by second visit    Time  5    Period  Weeks    Status  Deferred      PT SHORT TERM GOAL #2   Title  Pt will demonstrate independence with initial strengthening/postural/balance/vestibular HEP    Time  5    Period   Weeks    Status  Achieved      PT SHORT TERM GOAL #3   Title  Pt will improve LE strength to perform five time sit to stand without UE support to </= 15 seconds    Baseline  17 seconds  > declined to 25 seconds with UE support on arm chair    Time  5    Period  Weeks    Status  Not Met      PT SHORT TERM GOAL #4   Title  Pt will decrease falls risk as indicated by increase in BERG to >/= 50/56    Baseline  48/56 > 54/56    Time  5    Period  Weeks    Status  Achieved      PT SHORT TERM GOAL #5   Title  Pt will report 25% reduction in back soreness during activities in standing/puzzles/reading    Baseline  9/10 in standing currently > 50% improvement in pain 2 or 3/10 but has been performing in sitting - pt reports she has transitioned her activities to sitting due to back pain    Time  5    Period  Weeks    Status  Achieved        PT Long Term Goals - 03/13/18 6294      PT LONG TERM GOAL #1   Title  Pt will demonstrate independence with final HEP    Time  10    Period  Weeks    Status  On-going      PT LONG TERM GOAL #2   Title  Pt will report 50% reduction in pain during standing activities/puzzles/reading    Baseline  75% reduction in pain    Time  10    Period  Weeks    Status  Achieved      PT LONG TERM GOAL #3   Title  Pt will demonstrate improved LE strength as indicated by five time sit to stand without UE use to </= 13 seconds    Baseline  13 seconds    Time  10    Period  Weeks    Status  Achieved      PT LONG TERM GOAL #4   Title  Pt will decrease falls risk as indicated by BERG score >/= 53/56    Baseline  56/56    Time  10    Period  Weeks    Status  Achieved      PT LONG TERM GOAL #5   Title  Pt will ambulate x 230' indoors, 100' outside over pavement MOD I without use of cane and will negotiate 12 stairs with one rail MOD I    Time  10    Period  Weeks    Status  Achieved      PT LONG TERM GOAL #6   Title  Vestibular goal if indicated             Plan - 03/13/18 1052  Clinical Impression Statement  Completed vestibular assessment.  Pt does appear to have mild motion sensitivity and vestibular hypofunction but it is unclear if this is what is contributing to patient's dizziness that wakes her up.  Provided pt with seated gaze adaptation to begin to work on at home.  Completed assessment of most LTG with pt meeting 4/4 goals checked today.  Will check final goal next week and review final HEP.  Plan to D/C after next visit due to progress.    Rehab Potential  Good    PT Frequency  2x / week    PT Duration  Other (comment)   10 weeks   PT Treatment/Interventions  ADLs/Self Care Home Management;Moist Heat;Aquatic Therapy;Canalith Repostioning;Cryotherapy;DME Instruction;Gait training;Stair training;Functional mobility training;Therapeutic activities;Therapeutic exercise;Balance training;Neuromuscular re-education;Patient/family education;Manual techniques;Passive range of motion;Dry needling;Taping    PT Next Visit Plan  Finalize and review postural, balance and vesitbular HEP  (see combined instructions) try to progress x 1 in sitting to 60 seconds and try standing x 30 seconds.    Send to me on 3/4 for recert/D/C.      PT Home Exercise Plan  [id number] - CAR EXERCISES-KEEP THESE THE SAME.   REVIEW AND REVISE THESE: [email or web address].   Postural exercises - do not use towel roll.    VOR    Consulted and Agree with Plan of Care  Patient    Family Member Consulted  daughter       Patient will benefit from skilled therapeutic intervention in order to improve the following deficits and impairments:  Decreased balance, Decreased strength, Dizziness, Difficulty walking, Postural dysfunction, Pain  Visit Diagnosis: Chronic midline low back pain without sciatica  Difficulty in walking, not elsewhere classified  Abnormal posture  Muscle weakness (generalized)  Unsteadiness on feet  Dizziness and giddiness     Problem List Patient  Active Problem List   Diagnosis Date Noted  . Status post lumbar spinal fusion 08/26/2017  . Bilateral carotid artery stenosis 08/30/2016  . Chronic midline low back pain without sciatica 08/30/2016  . CKD (chronic kidney disease) stage 3, GFR 30-59 ml/min (HCC) 08/30/2016  . Palpitations 04/22/2016  . Left carotid bruit 04/22/2016  . Hypothyroidism   . Hypertension   . Heartburn   . Depression   . Dementia ([place])   . Breast cancer ([place])   . Muscle spasm of back 12/19/2014  . Urinary frequency 12/19/2014    Rico Junker, PT, DPT 03/13/18    11:00 AM    [facility] [address] [place], [place], [address] Phone: [phone]   Fax:  [phone]  Name: Regina Porter MRN: [id number] Date of Birth: 1934-02-18

## 2018-03-13 NOTE — Patient Instructions (Addendum)
Gaze Stabilization: Sitting    Keeping eyes on target on wall 3 feet away, and move head side to side for _30-60___ seconds. Repeat while moving head up and down for __30-60__ seconds. Do __2-3__ sessions per day.  Copyright  VHI. All rights reserved.   Gaze Stabilization: Tip Card  1.Target must remain in focus, not blurry, and appear stationary while head is in motion. 2.Perform exercises with small head movements (45 to either side of midline). 3.Increase speed of head motion so long as target is in focus. 4.If you wear eyeglasses, be sure you can see target through lens (therapist will give specific instructions for bifocal / progressive lenses). 5.These exercises may provoke dizziness or nausea. Work through these symptoms. If too dizzy, slow head movement slightly. Rest between each exercise. 6.Exercises demand concentration; avoid distractions.  Copyright  VHI. All rights reserved.         Due to discomfort - will no longer use towel roll along spine.    Over Head Pull: Narrow Grip    On back, knees bent, feet flat, band across thighs, elbows straight but relaxed. Pull hands apart (start). Keeping elbows straight, bring arms up and over head, hands toward floor. Keep pull steady on band. Hold momentarily. Return slowly, keeping pull steady, back to start. Repeat _10__ times. Band color __GREEN___    Side Pull: Double Arm    On back, knees bent, feet flat. Arms perpendicular to body, shoulder level, elbows straight but relaxed. Pull arms out to sides, elbows straight. Resistance band comes across collarbones, hands toward floor. Hold momentarily. Slowly return to starting position. Repeat _10_ times. Band color _GREEN____     Sash    On back, knees bent, feet flat, left hand on left hip, right hand above left. Pull right arm DIAGONALLY (hip to shoulder) across chest. Bring right arm along head toward floor. Hold momentarily. Slowly return to starting  position. Repeat _10__ times. Do with left arm. Band color _GREEN___   Copyright  VHI. All rights reserved.  Shoulder Rotation: Double Arm    On back, knees bent, feet flat, elbows tucked at sides, bent 90, hands palms up. Pull hands apart and down toward floor, keeping elbows near sides. Hold momentarily. Slowly return to starting position. Repeat 10 times. Band color _GREEN_____    Access Code: [email or web address]  URL: [email or web address]  Date: 02/11/2018  Prepared by: Willow Ora   Exercises  Supine Figure 4 Piriformis Stretch - 2 reps - 30 second hold - 2x daily - 7x weekly  Supine Lower Trunk Rotation - 10 reps - 1 sets - 2x daily - 7x weekly  Supine Bridge - 10 reps - 1 sets - 5 hold - 1x daily - 5x weekly  Hooklying Clamshell with Resistance - 10 reps - 1 sets - 5 hold - 1x daily - 5x weekly  Sit to Stand - 10 reps - 1 sets - 1x daily - 5x weekly  Romberg Stance Eyes Closed on Foam Pad - 3 reps - 1 sets - 3 hold - 1x daily - 5x weekly  Tandem Stance on Foam Pad with Eyes Open - 3 reps - 1 sets - 15 hold - 1x daily - 5x weekly     THESE EXERCISES ARE FOR HER TO PERFORM AFTER A LONG CAR RIDE - KEEP THESE THE SAME  Access Code: [email or web address]  URL: [email or web address]  Date: 02/13/2018  Prepared by: Misty Stanley   Exercises  Seated Forward Bending - 3 reps - 1x  daily - 7x weekly  Seated Cat Camel - 5 reps - 1x daily - 7x weekly  Seated Trunk Rotation Stretch - 3 reps - 1x daily - 7x weekly

## 2018-03-14 ENCOUNTER — Other Ambulatory Visit: Payer: Self-pay | Admitting: Nurse Practitioner

## 2018-03-16 ENCOUNTER — Telehealth: Payer: Self-pay | Admitting: Nurse Practitioner

## 2018-03-16 MED ORDER — TRAZODONE HCL 50 MG PO TABS: 50.0000 mg | ORAL_TABLET | Freq: Every day | ORAL | 1 refills | Status: DC

## 2018-03-16 NOTE — Telephone Encounter (Signed)
Medication, Trazodone,  IS NOT in current medication list. Medication was removed in October. Called and spoke with patient and she stated that she is still taking the Trazodone 50mg  at bedtime. Wants refill. Is this ok to add back to list. Please Advise.

## 2018-03-16 NOTE — Telephone Encounter (Signed)
Pt said [facility] pharmacy called stating we had refused her refill for Trazodone. She would like to know why?  Please give her a call...  Thanks, Vilinda Blanks.

## 2018-03-16 NOTE — Telephone Encounter (Signed)
Yes this is fine.

## 2018-03-16 NOTE — Telephone Encounter (Signed)
Medication list updated and Rx sent to Pharmacy.

## 2018-03-18 ENCOUNTER — Encounter: Payer: Self-pay | Admitting: Physical Therapy

## 2018-03-18 ENCOUNTER — Ambulatory Visit: Payer: Medicare Other | Attending: Nurse Practitioner | Admitting: Physical Therapy

## 2018-03-18 DIAGNOSIS — R293 Abnormal posture: Secondary | ICD-10-CM | POA: Diagnosis not present

## 2018-03-18 DIAGNOSIS — G8929 Other chronic pain: Secondary | ICD-10-CM | POA: Insufficient documentation

## 2018-03-18 DIAGNOSIS — M545 Low back pain, unspecified: Secondary | ICD-10-CM

## 2018-03-18 DIAGNOSIS — R42 Dizziness and giddiness: Secondary | ICD-10-CM | POA: Diagnosis not present

## 2018-03-18 DIAGNOSIS — M6281 Muscle weakness (generalized): Secondary | ICD-10-CM | POA: Insufficient documentation

## 2018-03-18 NOTE — Patient Instructions (Addendum)
Gaze Stabilization: Sitting    Keeping eyes on target on wall 3 feet away, and move head side to side for _30-60___ seconds. Repeat while moving head up and down for __30-60__ seconds. Do __2-3__ sessions per day.  Copyright  VHI. All rights reserved.   Gaze Stabilization: Tip Card  1.Target must remain in focus, not blurry, and appear stationary while head is in motion. 2.Perform exercises with small head movements (45 to either side of midline). 3.Increase speed of head motion so long as target is in focus. 4.If you wear eyeglasses, be sure you can see target through lens (therapist will give specific instructions for bifocal / progressive lenses). 5.These exercises may provoke dizziness or nausea. Work through these symptoms. If too dizzy, slow head movement slightly. Rest between each exercise. 6.Exercises demand concentration; avoid distractions.  Copyright  VHI. All rights reserved.       Over Head Pull: Narrow Grip    On back, knees bent, feet flat, band across thighs, elbows straight but relaxed. Pull hands apart (start). Keeping elbows straight, bring arms up and over head, hands toward floor. Keep pull steady on band. Hold momentarily. Return slowly, keeping pull steady, back to start. Repeat _10__ times. Band color __GREEN___    Side Pull: Double Arm    On back, knees bent, feet flat. Arms perpendicular to body, shoulder level, elbows straight but relaxed. Pull arms out to sides, elbows straight. Resistance band comes across collarbones, hands toward floor. Hold momentarily. Slowly return to starting position. Repeat _10_ times. Band color _GREEN____     Sash    On back, knees bent, feet flat, left hand on left hip, right hand above left. Pull right arm DIAGONALLY (hip to shoulder) across chest. Bring right arm along head toward floor. Hold momentarily. Slowly return to starting position. Repeat _10__ times. Do with left arm. Band color _GREEN___    Copyright  VHI. All rights reserved.  Shoulder Rotation: Double Arm    On back, knees bent, feet flat, elbows tucked at sides, bent 90, hands palms up. Pull hands apart and down toward floor, keeping elbows near sides. Hold momentarily. Slowly return to starting position. Repeat 10 times. Band color _GREEN_____   Access Code: [email or web address]  URL: [email or web address]  Date: 03/18/2018  Prepared by: Willow Ora   Exercises  Supine Figure 4 Piriformis Stretch - 2 reps - 30 second hold - 2x daily - 7x weekly  Supine Lower Trunk Rotation - 10 reps - 1 sets - 2x daily - 7x weekly  Supine Bridge - 10 reps - 1 sets - 5 hold - 1x daily - 5x weekly  Hooklying Clamshell with Resistance - 10 reps - 1 sets - 5 hold - 1x daily - 5x weekly  Sit to Stand - 10 reps - 1 sets - 1x daily - 5x weekly  Romberg Stance Eyes Closed on Foam Pad - 3 reps - 1 sets - 30 hold - 1x daily - 5x weekly  Tandem Stance on Foam Pad with Eyes Open - 3 reps - 1 sets - 15 hold - 1x daily - 5x weekly  Wide Stance with Eyes Closed and Head Rotation on Foam Pad - 10 reps - 1 sets - 1x daily - 5x weekly  Wide Stance with Eyes Closed and Head Nods on Foam Pad - 10 reps - 1 sets - 1x daily - 5x weekly

## 2018-03-18 NOTE — Therapy (Addendum)
[facility] [address] [place], [place], [address] Phone: [phone]   Fax:  [phone]  Physical Therapy Treatment and D/C Summary  Patient Details  Name: Regina Porter MRN: [id number] Date of Birth: 07-24-1934 Referring Provider (PT): [name], NP   Encounter Date: 03/18/2018  PT End of Session - 03/18/18 0851    Visit Number  16    Number of Visits  17    Date for PT Re-Evaluation  03/13/18    Authorization Type  Medicare - 10th visit PN.  VL: medicare guidelines    PT Start Time  0848    PT Stop Time  0928    PT Time Calculation (min)  40 min    Activity Tolerance  Patient tolerated treatment well;No increased pain    Behavior During Therapy  WFL for tasks assessed/performed       Past Medical History:  Diagnosis Date  . Anemia   . Anterolisthesis    grade 1: L3-4  . Anxiety   . Arthritis   . Breast cancer ([place])   . Chronic kidney disease   . Dementia ([place])   . Depression   . Dyspnea    W/ PAIN   . Gastroesophageal reflux disease   . Headache    TO SEE NEURO MD  . Heartburn   . Hypertension   . Hypothyroidism   . Leg cramps   . Post-menopause bleeding   . Restless leg syndrome 10/07/2014    Past Surgical History:  Procedure Laterality Date  . BREAST SURGERY Left    Lumpectomy   . CATARACT EXTRACTION, BILATERAL    . hysteroscopy biopsy      There were no vitals filed for this visit.  Subjective Assessment - 03/18/18 0849    Subjective  No new complaints. No falls. Having some rib/side pain today, feels it is due to how she slept last night.     Patient is accompained by:  Family member    Patient Stated Goals  To improve the strength in her back and relieve pain.  To improve dizziness.  To walk without cane.    Currently in Pain?  Yes    Pain Score  3     Pain Location  Other (Comment)   side/ribs   Pain Orientation  Right    Pain Descriptors / Indicators   Aching;Sore;Discomfort    Pain Type  Acute pain    Pain Onset  Today   woke up with it   Pain Frequency  Constant    Aggravating Factors   poor sleeping posture    Pain Relieving Factors  sitting, heating pads      Treatment: reviewed and adavanced as indicated pt's HEP. Min guard for balance ex's. Cues to slow down with other ex's for improved control/technique.   Gaze Stabilization: Sitting    Keeping eyes on target on wall 3 feet away, and move head side to side for _30-60___ seconds. Repeat while moving head up and down for __30-60__ seconds. Do __2-3__ sessions per day.  Copyright  VHI. All rights reserved.   Gaze Stabilization: Tip Card  1.Target must remain in focus, not blurry, and appear stationary while head is in motion. 2.Perform exercises with small head movements (45 to either side of midline). 3.Increase speed of head motion so long as target is in focus. 4.If you wear eyeglasses, be sure you can see target through lens (therapist will give specific instructions for bifocal /  progressive lenses). 5.These exercises may provoke dizziness or nausea. Work through these symptoms. If too dizzy, slow head movement slightly. Rest between each exercise. 6.Exercises demand concentration; avoid distractions.  Copyright  VHI. All rights reserved.       Over Head Pull: Narrow Grip    On back, knees bent, feet flat, band across thighs, elbows straight but relaxed. Pull hands apart (start). Keeping elbows straight, bring arms up and over head, hands toward floor. Keep pull steady on band. Hold momentarily. Return slowly, keeping pull steady, back to start. Repeat _10__ times. Band color __GREEN___    Side Pull: Double Arm    On back, knees bent, feet flat. Arms perpendicular to body, shoulder level, elbows straight but relaxed. Pull arms out to sides, elbows straight. Resistance band comes across collarbones, hands toward floor. Hold momentarily. Slowly return  to starting position. Repeat _10_ times. Band color _GREEN____     Sash    On back, knees bent, feet flat, left hand on left hip, right hand above left. Pull right arm DIAGONALLY (hip to shoulder) across chest. Bring right arm along head toward floor. Hold momentarily. Slowly return to starting position. Repeat _10__ times. Do with left arm. Band color _GREEN___   Copyright  VHI. All rights reserved.  Shoulder Rotation: Double Arm    On back, knees bent, feet flat, elbows tucked at sides, bent 90, hands palms up. Pull hands apart and down toward floor, keeping elbows near sides. Hold momentarily. Slowly return to starting position. Repeat 10 times. Band color _GREEN_____   Access Code: [email or web address]  URL: [email or web address]  Date: 03/18/2018  Prepared by: Willow Ora   Exercises  Supine Figure 4 Piriformis Stretch - 2 reps - 30 second hold - 2x daily - 7x weekly  Supine Lower Trunk Rotation - 10 reps - 1 sets - 2x daily - 7x weekly  Supine Bridge - 10 reps - 1 sets - 5 hold - 1x daily - 5x weekly  Hooklying Clamshell with Resistance - 10 reps - 1 sets - 5 hold - 1x daily - 5x weekly  Sit to Stand - 10 reps - 1 sets - 1x daily - 5x weekly  Romberg Stance Eyes Closed on Foam Pad - 3 reps - 1 sets - 30 hold - 1x daily - 5x weekly  Tandem Stance on Foam Pad with Eyes Open - 3 reps - 1 sets - 15 hold - 1x daily - 5x weekly  Wide Stance with Eyes Closed and Head Rotation on Foam Pad - 10 reps - 1 sets - 1x daily - 5x weekly  Wide Stance with Eyes Closed and Head Nods on Foam Pad - 10 reps - 1 sets - 1x daily - 5x weekly             PT Short Term Goals - 03/13/18 7253      PT SHORT TERM GOAL #1   Title  Pt will participate in and tolerate full vestibular assessment    Baseline  Not needed; dizziness resolved by second visit    Time  5    Period  Weeks    Status  Deferred      PT SHORT TERM GOAL #2   Title  Pt will demonstrate independence with initial  strengthening/postural/balance/vestibular HEP    Time  5    Period  Weeks    Status  Achieved      PT SHORT TERM GOAL #3   Title  Pt will improve  LE strength to perform five time sit to stand without UE support to </= 15 seconds    Baseline  17 seconds  > declined to 25 seconds with UE support on arm chair    Time  5    Period  Weeks    Status  Not Met      PT SHORT TERM GOAL #4   Title  Pt will decrease falls risk as indicated by increase in BERG to >/= 50/56    Baseline  48/56 > 54/56    Time  5    Period  Weeks    Status  Achieved      PT SHORT TERM GOAL #5   Title  Pt will report 25% reduction in back soreness during activities in standing/puzzles/reading    Baseline  9/10 in standing currently > 50% improvement in pain 2 or 3/10 but has been performing in sitting - pt reports she has transitioned her activities to sitting due to back pain    Time  5    Period  Weeks    Status  Achieved        PT Long Term Goals - 03/13/18 1660      PT LONG TERM GOAL #1   Title  Pt will demonstrate independence with final HEP    Time  10    Period  Weeks    Status  On-going      PT LONG TERM GOAL #2   Title  Pt will report 50% reduction in pain during standing activities/puzzles/reading    Baseline  75% reduction in pain    Time  10    Period  Weeks    Status  Achieved      PT LONG TERM GOAL #3   Title  Pt will demonstrate improved LE strength as indicated by five time sit to stand without UE use to </= 13 seconds    Baseline  13 seconds    Time  10    Period  Weeks    Status  Achieved      PT LONG TERM GOAL #4   Title  Pt will decrease falls risk as indicated by BERG score >/= 53/56    Baseline  56/56    Time  10    Period  Weeks    Status  Achieved      PT LONG TERM GOAL #5   Title  Pt will ambulate x 230' indoors, 100' outside over pavement MOD I without use of cane and will negotiate 12 stairs with one rail MOD I    Time  10    Period  Weeks    Status  Achieved        PT LONG TERM GOAL #6   Title  Vestibular goal if indicated            Plan - 03/18/18 6301    Clinical Impression Statement  Today's skilled session focused on review/advancement of HEP for discharge today. Pt and daughter without any questions at the end of session. Both agreeable to discharge today.     Rehab Potential  Good    PT Frequency  2x / week    PT Duration  Other (comment)   10 weeks   PT Treatment/Interventions  ADLs/Self Care Home Management;Moist Heat;Aquatic Therapy;Canalith Repostioning;Cryotherapy;DME Instruction;Gait training;Stair training;Functional mobility training;Therapeutic activities;Therapeutic exercise;Balance training;Neuromuscular re-education;Patient/family education;Manual techniques;Passive range of motion;Dry needling;Taping    PT Next Visit Plan  discharge today.  PT [place] - care ex's, [id number]- strengthening/balance ex's, refer to pt instructions of this note for postural and VOR ex's.      Consulted and Agree with Plan of Care  Patient    Family Member Consulted  daughter       Patient will benefit from skilled therapeutic intervention in order to improve the following deficits and impairments:  Decreased balance, Decreased strength, Dizziness, Difficulty walking, Postural dysfunction, Pain  Visit Diagnosis: Chronic midline low back pain without sciatica  Abnormal posture  Muscle weakness (generalized)  Dizziness and giddiness     Problem List Patient Active Problem List   Diagnosis Date Noted  . Status post lumbar spinal fusion 08/26/2017  . Bilateral carotid artery stenosis 08/30/2016  . Chronic midline low back pain without sciatica 08/30/2016  . CKD (chronic kidney disease) stage 3, GFR 30-59 ml/min (HCC) 08/30/2016  . Palpitations 04/22/2016  . Left carotid bruit 04/22/2016  . Hypothyroidism   . Hypertension   . Heartburn   . Depression   . Dementia ([place])   . Breast cancer ([place])   . Muscle  spasm of back 12/19/2014  . Urinary frequency 12/19/2014    Willow Ora, PTA, [facility] [address], [place], [place] [address] [phone] 03/18/18, 2:33 PM     PHYSICAL THERAPY DISCHARGE SUMMARY  Visits from Start of Care: 16  Current functional level related to goals / functional outcomes: See impression statement and LTG achievement above; pt met all LTG   Remaining deficits: Mild mid back pain   Education / Equipment: HEP  Plan: Patient agrees to discharge.  Patient goals were met. Patient is being discharged due to meeting the stated rehab goals.  ?????    Rico Junker, PT, DPT 03/18/18    2:49 PM    Name: BRAYLINN GULDEN MRN: [id number] Date of Birth: 03/18/1934

## 2018-03-29 ENCOUNTER — Other Ambulatory Visit: Payer: Self-pay | Admitting: Nurse Practitioner

## 2018-03-29 DIAGNOSIS — G3184 Mild cognitive impairment, so stated: Secondary | ICD-10-CM

## 2018-04-24 ENCOUNTER — Ambulatory Visit: Payer: Medicare Other | Admitting: Nurse Practitioner

## 2018-05-01 ENCOUNTER — Ambulatory Visit: Payer: Self-pay

## 2018-05-01 ENCOUNTER — Encounter: Payer: Medicare Other | Admitting: Nurse Practitioner

## 2018-05-01 ENCOUNTER — Ambulatory Visit: Payer: Medicare Other | Admitting: Nurse Practitioner

## 2018-05-04 ENCOUNTER — Telehealth: Payer: Self-pay | Admitting: *Deleted

## 2018-05-04 NOTE — Telephone Encounter (Signed)
Daughter calling asking if you would write a letter stating that her mother had surgery 05/2017 for lumbar stenosis and that she is the major caregiver? Pt states she's on academic probation and needs this letter for school because she dropped the class. I advised to daughter that orthopedics did the surgery and she may need to start with them? Daughter wanted me to send you the message.

## 2018-05-04 NOTE — Telephone Encounter (Signed)
Since this was due to the surgery I would have her contact the surgeon in regards to this (he would be the one to know more specific restrictions that she had)

## 2018-05-04 NOTE — Telephone Encounter (Signed)
Spoke with patient and advised results   

## 2018-05-05 ENCOUNTER — Encounter: Payer: Self-pay | Admitting: Nurse Practitioner

## 2018-05-05 ENCOUNTER — Ambulatory Visit (INDEPENDENT_AMBULATORY_CARE_PROVIDER_SITE_OTHER): Payer: Medicare Other | Admitting: Nurse Practitioner

## 2018-05-05 ENCOUNTER — Other Ambulatory Visit: Payer: Self-pay

## 2018-05-05 DIAGNOSIS — Z170 Estrogen receptor positive status [ER+]: Secondary | ICD-10-CM | POA: Diagnosis not present

## 2018-05-05 DIAGNOSIS — C50912 Malignant neoplasm of unspecified site of left female breast: Secondary | ICD-10-CM | POA: Diagnosis not present

## 2018-05-05 DIAGNOSIS — N184 Chronic kidney disease, stage 4 (severe): Secondary | ICD-10-CM | POA: Insufficient documentation

## 2018-05-05 DIAGNOSIS — Z0000 Encounter for general adult medical examination without abnormal findings: Secondary | ICD-10-CM

## 2018-05-05 DIAGNOSIS — F419 Anxiety disorder, unspecified: Secondary | ICD-10-CM | POA: Diagnosis not present

## 2018-05-05 DIAGNOSIS — F0280 Dementia in other diseases classified elsewhere without behavioral disturbance: Secondary | ICD-10-CM

## 2018-05-05 DIAGNOSIS — K5900 Constipation, unspecified: Secondary | ICD-10-CM

## 2018-05-05 DIAGNOSIS — E2839 Other primary ovarian failure: Secondary | ICD-10-CM

## 2018-05-05 DIAGNOSIS — G301 Alzheimer's disease with late onset: Secondary | ICD-10-CM

## 2018-05-05 DIAGNOSIS — E039 Hypothyroidism, unspecified: Secondary | ICD-10-CM | POA: Diagnosis not present

## 2018-05-05 DIAGNOSIS — I10 Essential (primary) hypertension: Secondary | ICD-10-CM | POA: Diagnosis not present

## 2018-05-05 MED ORDER — AMLODIPINE BESYLATE 10 MG PO TABS: 10.0000 mg | ORAL_TABLET | Freq: Every day | ORAL | 1 refills | Status: DC

## 2018-05-05 MED ORDER — HYDRALAZINE HCL 100 MG PO TABS: 100.0000 mg | ORAL_TABLET | Freq: Three times a day (TID) | ORAL | 1 refills | Status: DC

## 2018-05-05 MED ORDER — LOSARTAN POTASSIUM 100 MG PO TABS: ORAL_TABLET | ORAL | 1 refills | Status: DC

## 2018-05-05 NOTE — Patient Instructions (Signed)
Regina Porter , Thank you for taking time to come for your Medicare Wellness Visit. I appreciate your ongoing commitment to your health goals. Please review the following plan we discussed and let me know if I can assist you in the future.   Screening recommendations/referrals: Colonoscopy aged out Mammogram overdue- please schedule Bone Density overdue- please schedule  Recommended yearly ophthalmology/optometry visit for glaucoma screening and checkup Recommended yearly dental visit for hygiene and checkup  Vaccinations: Influenza vaccine due October 2020 Pneumococcal vaccine- up to date Tdap vaccine up to date Shingles vaccine- does not qualify if you have not had chicken pox    Advanced directives: will send information, please review and complete.  Conditions/risks identified: at risk for memory deficit   Next appointment: 1 year.    Preventive Care 82 Years and Older, Female Preventive care refers to lifestyle choices and visits with your health care provider that can promote health and wellness. What does preventive care include?  A yearly physical exam. This is also called an annual well check.  Dental exams once or twice a year.  Routine eye exams. Ask your health care provider how often you should have your eyes checked.  Personal lifestyle choices, including:  Daily care of your teeth and gums.  Regular physical activity.  Eating a healthy diet.  Avoiding tobacco and drug use.  Limiting alcohol use.  Practicing safe sex.  Taking low-dose aspirin every day.  Taking vitamin and mineral supplements as recommended by your health care provider. What happens during an annual well check? The services and screenings done by your health care provider during your annual well check will depend on your age, overall health, lifestyle risk factors, and family history of disease. Counseling  Your health care provider may ask you questions about your:  Alcohol use.   Tobacco use.  Drug use.  Emotional well-being.  Home and relationship well-being.  Sexual activity.  Eating habits.  History of falls.  Memory and ability to understand (cognition).  Work and work Statistician.  Reproductive health. Screening  You may have the following tests or measurements:  Height, weight, and BMI.  Blood pressure.  Lipid and cholesterol levels. These may be checked every 5 years, or more frequently if you are over 25 years old.  Skin check.  Lung cancer screening. You may have this screening every year starting at age 30 if you have a 30-pack-year history of smoking and currently smoke or have quit within the past 15 years.  Fecal occult blood test (FOBT) of the stool. You may have this test every year starting at age 46.  Flexible sigmoidoscopy or colonoscopy. You may have a sigmoidoscopy every 5 years or a colonoscopy every 10 years starting at age 29.  Hepatitis C blood test.  Hepatitis B blood test.  Sexually transmitted disease (STD) testing.  Diabetes screening. This is done by checking your blood sugar (glucose) after you have not eaten for a while (fasting). You may have this done every 1-3 years.  Bone density scan. This is done to screen for osteoporosis. You may have this done starting at age 74.  Mammogram. This may be done every 1-2 years. Talk to your health care provider about how often you should have regular mammograms. Talk with your health care provider about your test results, treatment options, and if necessary, the need for more tests. Vaccines  Your health care provider may recommend certain vaccines, such as:  Influenza vaccine. This is recommended every year.  Tetanus, diphtheria, and acellular pertussis (Tdap, Td) vaccine. You may need a Td booster every 10 years.  Zoster vaccine. You may need this after age 35.  Pneumococcal 13-valent conjugate (PCV13) vaccine. One dose is recommended after age 1.  Pneumococcal  polysaccharide (PPSV23) vaccine. One dose is recommended after age 57. Talk to your health care provider about which screenings and vaccines you need and how often you need them. This information is not intended to replace advice given to you by your health care provider. Make sure you discuss any questions you have with your health care provider. Document Released: 01/27/2015 Document Revised: 09/20/2015 Document Reviewed: 11/01/2014 Elsevier Interactive Patient Education  2017 [place] Prevention in the Home Falls can cause injuries. They can happen to people of all ages. There are many things you can do to make your home safe and to help prevent falls. What can I do on the outside of my home?  Regularly fix the edges of walkways and driveways and fix any cracks.  Remove anything that might make you trip as you walk through a door, such as a raised step or threshold.  Trim any bushes or trees on the path to your home.  Use bright outdoor lighting.  Clear any walking paths of anything that might make someone trip, such as rocks or tools.  Regularly check to see if handrails are loose or broken. Make sure that both sides of any steps have handrails.  Any raised decks and porches should have guardrails on the edges.  Have any leaves, snow, or ice cleared regularly.  Use sand or salt on walking paths during winter.  Clean up any spills in your garage right away. This includes oil or grease spills. What can I do in the bathroom?  Use night lights.  Install grab bars by the toilet and in the tub and shower. Do not use towel bars as grab bars.  Use non-skid mats or decals in the tub or shower.  If you need to sit down in the shower, use a plastic, non-slip stool.  Keep the floor dry. Clean up any water that spills on the floor as soon as it happens.  Remove soap buildup in the tub or shower regularly.  Attach bath mats securely with double-sided non-slip rug tape.   Do not have throw rugs and other things on the floor that can make you trip. What can I do in the bedroom?  Use night lights.  Make sure that you have a light by your bed that is easy to reach.  Do not use any sheets or blankets that are too big for your bed. They should not hang down onto the floor.  Have a firm chair that has side arms. You can use this for support while you get dressed.  Do not have throw rugs and other things on the floor that can make you trip. What can I do in the kitchen?  Clean up any spills right away.  Avoid walking on wet floors.  Keep items that you use a lot in easy-to-reach places.  If you need to reach something above you, use a strong step stool that has a grab bar.  Keep electrical cords out of the way.  Do not use floor polish or wax that makes floors slippery. If you must use wax, use non-skid floor wax.  Do not have throw rugs and other things on the floor that can make you trip. What can I do  with my stairs?  Do not leave any items on the stairs.  Make sure that there are handrails on both sides of the stairs and use them. Fix handrails that are broken or loose. Make sure that handrails are as long as the stairways.  Check any carpeting to make sure that it is firmly attached to the stairs. Fix any carpet that is loose or worn.  Avoid having throw rugs at the top or bottom of the stairs. If you do have throw rugs, attach them to the floor with carpet tape.  Make sure that you have a light switch at the top of the stairs and the bottom of the stairs. If you do not have them, ask someone to add them for you. What else can I do to help prevent falls?  Wear shoes that:  Do not have high heels.  Have rubber bottoms.  Are comfortable and fit you well.  Are closed at the toe. Do not wear sandals.  If you use a stepladder:  Make sure that it is fully opened. Do not climb a closed stepladder.  Make sure that both sides of the  stepladder are locked into place.  Ask someone to hold it for you, if possible.  Clearly mark and make sure that you can see:  Any grab bars or handrails.  First and last steps.  Where the edge of each step is.  Use tools that help you move around (mobility aids) if they are needed. These include:  Canes.  Walkers.  Scooters.  Crutches.  Turn on the lights when you go into a dark area. Replace any light bulbs as soon as they burn out.  Set up your furniture so you have a clear path. Avoid moving your furniture around.  If any of your floors are uneven, fix them.  If there are any pets around you, be aware of where they are.  Review your medicines with your doctor. Some medicines can make you feel dizzy. This can increase your chance of falling. Ask your doctor what other things that you can do to help prevent falls. This information is not intended to replace advice given to you by your health care provider. Make sure you discuss any questions you have with your health care provider. Document Released: 10/27/2008 Document Revised: 06/08/2015 Document Reviewed: 02/04/2014 Elsevier Interactive Patient Education  2017 [employer].

## 2018-05-05 NOTE — Patient Instructions (Signed)
To make sure to schedule mammogram and bone density  Increase physical activity as tolerates, 30 mins 5 days a week is a good goal.   Monitor blood pressure at home GOAL <140/90

## 2018-05-05 NOTE — Progress Notes (Signed)
This service is provided via telemedicine  No vital signs collected/recorded due to the encounter was a telemedicine visit.   Location of patient (ex: home, work):  Home  Patient consents to a telephone visit:  Yes  Location of the provider (ex: office, home): [facility], Office    Name of any referring provider:  N/A  Names of all persons participating in the telemedicine service and their role in the encounter:  S.Chrae B/CMA, Sherrie Mustache, NP, and Patient   Time spent on call: 18 min with Medical assistant    Virtual Visit via Telephone Note  I connected with Anah L R[place]burg on 05/05/18 at  9:00 AM EDT by telephone and verified that I am speaking with the correct person using two identifiers.   I discussed the limitations, risks, security and privacy concerns of performing an evaluation and management service by telephone and the availability of in person appointments. I also discussed with the patient that there may be a patient responsible charge related to this service. The patient expressed understanding and agreed to proceed.   Careteam: Patient Care Team: [name], NP as PCP - General (Geriatric Medicine) Jettie Booze, MD as PCP - Cardiology (Cardiology) Dustin Folks, MD as Consulting Physician (Hematology and Oncology) Esperanza Heir, MD as Consulting Physician (Obstetrics and Gynecology) Consuella Lose, MD as Consulting Physician (Neurosurgery) Deterding, Jeneen Rinks, MD as Consulting Physician (Nephrology)  Advanced Directive information    Allergies  Allergen Reactions  . Shellfish Allergy Swelling and Rash    MOUTH  . Neomy-Bacit-Polymyx-Pramoxine     UNSPECIFIED REACTION  Not sure which antibiotic she has a reaction to    Chief Complaint  Patient presents with  . Medical Management of Chronic Issues    4 month follow-up. Tele-visit      HPI: Patient is a 84 y.o. female for routine follow up.   HTN- on  hydralazine 100 mg every 8 hours,  norvasc 10 mg, cozaar 100 mg and metoprolol succinate 100 mg daily.  States she has not been taking her blood pressure at home.   Anxiety- buspar 10 mg twice daily. Reports occasional mood swings due to being "locked in"   Chronic low back pain-Had spinal fusion in May due to lumbar stenosis- was participating in PT due low back on pain and reports symptoms improved significantly since she has done PT. not currently on medication.   Dizziness- improved   Memory loss- continues on aricept 5 mg daily, memory stable.   Hypothyroidism- continues on synthroid 88 mcg  CKD- following with nephrologist who is following renal function. Has upcoming appt.    Review of Systems:  Review of Systems  Constitutional: Negative for chills, fever, malaise/fatigue and weight loss.  HENT: Negative for congestion, hearing loss and tinnitus.   Eyes: Negative for blurred vision.  Respiratory: Negative for cough and shortness of breath.   Cardiovascular: Negative for chest pain, palpitations and leg swelling.  Gastrointestinal: Negative for abdominal pain, blood in stool, constipation, diarrhea and melena.  Genitourinary: Negative for dysuria.  Musculoskeletal: Negative for back pain, falls and joint pain.  Skin: Negative for itching and rash.  Neurological: Negative for dizziness, loss of consciousness, weakness and headaches. Tremors:   Psychiatric/Behavioral: Positive for memory loss. Negative for depression. The patient is nervous/anxious and has insomnia (chronic sleep issues).     Past Medical History:  Diagnosis Date  . Anemia   . Anterolisthesis    grade 1: L3-4  .  Anxiety   . Arthritis   . Breast cancer ([place])   . Chronic kidney disease   . Dementia ([place])   . Depression   . Dyspnea    W/ PAIN   . Gastroesophageal reflux disease   . Headache    TO SEE NEURO MD  . Heartburn   . Hypertension   . Hypothyroidism   . Leg cramps   . Post-menopause  bleeding   . Restless leg syndrome 10/07/2014   Past Surgical History:  Procedure Laterality Date  . BREAST SURGERY Left    Lumpectomy   . CATARACT EXTRACTION, BILATERAL    . hysteroscopy biopsy     Social History:   reports that she has quit smoking. She has a 2.00 pack-year smoking history. She has never used smokeless tobacco. She reports that she does not drink alcohol or use drugs.  Family History  Problem Relation Age of Onset  . Hypertension Mother   . Arthritis Mother   . Heart disease Mother   . Hypertension Father   . Heart disease Father   . Cancer Brother   . Breast cancer Paternal Aunt     Medications: Patient's Medications  New Prescriptions   No medications on file  Previous Medications   ACETAMINOPHEN (TYLENOL) 500 MG TABLET    Take 500 mg by mouth daily as needed.   AMLODIPINE (NORVASC) 10 MG TABLET    TAKE 1 TABLET BY MOUTH EVERY DAY   ASPIRIN EC 81 MG TABLET    Take 81 mg by mouth daily.   BUMETANIDE (BUMEX) 2 MG TABLET    Take 2 mg by mouth daily.   BUSPIRONE (BUSPAR) 10 MG TABLET    TAKE 1 TABLET BY MOUTH TWICE DAILY .DX F41.9 APPT IS OVERDUE   CARBOXYMETHYLCELLULOSE SODIUM (EYE DROPS OP)    Apply to eye as needed.   COD LIVER OIL 1000 MG CAPS    Take 1 capsule by mouth 2 (two) times daily.    DONEPEZIL (ARICEPT) 5 MG TABLET    TAKE 1 TABLET BY MOUTH EVERYDAY AT BEDTIME   HYDRALAZINE (APRESOLINE) 100 MG TABLET    Take 1 tablet (100 mg total) by mouth 3 (three) times daily.   LEVOTHYROXINE (SYNTHROID, LEVOTHROID) 88 MCG TABLET    Take 1 tablet (88 mcg total) by mouth daily.   LOSARTAN (COZAAR) 100 MG TABLET    TAKE 1 TABLET BY MOUTH EVERYDAY AT BEDTIME   METOPROLOL SUCCINATE (TOPROL-XL) 100 MG 24 HR TABLET    TAKE 1 TABLET BY MOUTH EVERYDAY AT BEDTIME   TAMOXIFEN (NOLVADEX) 20 MG TABLET    Take 20 mg by mouth daily.   TRAZODONE (DESYREL) 50 MG TABLET    Take 1 tablet (50 mg total) by mouth at bedtime.  Modified Medications   No medications on file   Discontinued Medications   No medications on file     Physical Exam: unable due to tele-visit.     Labs reviewed: Basic Metabolic Panel: Recent Labs    06/06/17 0456 07/04/17 1036 09/01/17 1335 10/25/17 0127  NA 139 140  --  138  K 4.5 4.2  --  3.8  CL 107 104  --  104  CO2 23 27  --  26  GLUCOSE 129* 114*  --  116*  BUN 19 23  --  30*  CREATININE 1.67* 1.85*  --  2.26*  CALCIUM 8.7* 9.2  --  9.3  TSH  --  0.15* 0.48  --  Liver Function Tests: Recent Labs    05/30/17 1451 07/04/17 1036 10/25/17 0127  AST 24 17 25   ALT 19 12 18   ALKPHOS 36*  --  40  BILITOT 0.5 0.3 0.5  PROT 7.7 7.1 7.8  ALBUMIN 3.8  --  3.7   No results for input(s): LIPASE, AMYLASE in the last 8760 hours. No results for input(s): AMMONIA in the last 8760 hours. CBC: Recent Labs    06/05/17 0357 07/04/17 1036 10/25/17 0127  WBC 10.1 4.8 6.4  NEUTROABS  --  2,443 2.6  HGB 9.3* 9.9* 12.1  HCT 28.2* 29.2* 38.3  MCV 91.6 90.1 93.2  PLT 223 245 263   Lipid Panel: No results for input(s): CHOL, HDL, LDLCALC, TRIG, CHOLHDL, LDLDIRECT in the last 8760 hours. TSH: Recent Labs    07/04/17 1036 09/01/17 1335  TSH 0.15* 0.48   A1C: No results found for: HGBA1C   Assessment/Plan 1. Chronic kidney disease, stage 4 (severe) (HCC) -ongoing, routinely seeing nephrologist who is following renal function.  2. Malignant neoplasm of left breast in female, estrogen receptor positive, unspecified site of breast ([place]) Routine follow up with oncologist, continues on tamoxifen daily   3. Hypothyroidism, unspecified type -continues on synthroid 88 mcg, will need TSH follow up at next visit.   4. Essential hypertension -continue current medication and lifestyle modifications, instucted to   5. Anxiety Stable, encouraged   6. Late onset Alzheimer's disease without behavioral disturbance (HCC) Stable, continues on aricept 5 mg daily. No acute changes in cognitive or functional status.  7.  Constipation, unspecified constipation type Controlled.   Next appt: 3 months.  Carlos American. Harle Battiest  [facility] [phone]   Follow Up Instructions:    I discussed the assessment and treatment plan with the patient. The patient was provided an opportunity to ask questions and all were answered. The patient agreed with the plan and demonstrated an understanding of the instructions.   The patient was advised to call back or seek an in-person evaluation if the symptoms worsen or if the condition fails to improve as anticipated.  I provided 12 minutes of non-face-to-face time during this encounter.  avs printed and mailed.  [name], NP

## 2018-05-05 NOTE — Progress Notes (Signed)
Subjective:   Regina Porter is a 84 y.o. female who presents for Medicare Annual (Subsequent) preventive examination.  Review of Systems:   Cardiac Risk Factors include: advanced age (>17men, >19 women);hypertension;obesity (BMI >30kg/m2);sedentary lifestyle     Objective:     Vitals: There were no vitals taken for this visit.  There is no height or weight on file to calculate BMI.  Advanced Directives 05/05/2018 05/05/2018 01/02/2018 12/24/2017 10/25/2017 06/04/2017 05/30/2017  Does Patient Have a Medical Advance Directive? No No No No No No No  Would patient like information on creating a medical advance directive? Yes (MAU/Ambulatory/Procedural Areas - Information given) No - Patient declined - - No - Patient declined No - Patient declined Yes (MAU/Ambulatory/Procedural Areas - Information given)    Tobacco Social History   Tobacco Use  Smoking Status Former Smoker  . Packs/day: 0.10  . Years: 20.00  . Pack years: 2.00  Smokeless Tobacco Never Used  Tobacco Comment   Quit at  age 43     Counseling given: Not Answered Comment: Quit at  age 51   Clinical Intake:  Pre-visit preparation completed: Yes  Pain : No/denies pain     BMI - recorded: 31.58 Nutritional Status: BMI > 30  Obese Nutritional Risks: None Diabetes: No  How often do you need to have someone help you when you read instructions, pamphlets, or other written materials from your doctor or pharmacy?: 3 - Sometimes What is the last grade level you completed in school?: a year of college  Interpreter Needed?: No     Past Medical History:  Diagnosis Date  . Anemia   . Anterolisthesis    grade 1: L3-4  . Anxiety   . Arthritis   . Breast cancer ([place])   . Chronic kidney disease   . Dementia ([place])   . Depression   . Dyspnea    W/ PAIN   . Gastroesophageal reflux disease   . Headache    TO SEE NEURO MD  . Heartburn   . Hypertension   . Hypothyroidism   . Leg cramps   . Post-menopause  bleeding   . Restless leg syndrome 10/07/2014   Past Surgical History:  Procedure Laterality Date  . BREAST SURGERY Left    Lumpectomy   . CATARACT EXTRACTION, BILATERAL    . hysteroscopy biopsy     Family History  Problem Relation Age of Onset  . Hypertension Mother   . Arthritis Mother   . Heart disease Mother   . Hypertension Father   . Heart disease Father   . Cancer Brother   . Breast cancer Paternal Aunt    Social History   Socioeconomic History  . Marital status: Widowed    Spouse name: Not on file  . Number of children: 5  . Years of education: 17  . Highest education level: Not on file  Occupational History  . Occupation: Retired  Scientific laboratory technician  . Financial resource strain: Not hard at all  . Food insecurity:    Worry: Never true    Inability: Never true  . Transportation needs:    Medical: No    Non-medical: No  Tobacco Use  . Smoking status: Former Smoker    Packs/day: 0.10    Years: 20.00    Pack years: 2.00  . Smokeless tobacco: Never Used  . Tobacco comment: Quit at  age 72  Substance and Sexual Activity  . Alcohol use: No    Alcohol/week: 0.0 standard  drinks  . Drug use: No  . Sexual activity: Never  Lifestyle  . Physical activity:    Days per week: 0 days    Minutes per session: 0 min  . Stress: Only a little  Relationships  . Social connections:    Talks on phone: More than three times a week    Gets together: More than three times a week    Attends religious service: Never    Active member of club or organization: No    Attends meetings of clubs or organizations: Never    Relationship status: Widowed  Other Topics Concern  . Not on file  Social History Narrative   Diet?       Do you drink/eat things with caffeine? yes      Marital status?                widow                    What year were you married?      Do you live in a house, apartment, assisted living, condo, trailer, etc.? house      Is it one or more stories? yes       How many persons live in your home? 2      Do you have any pets in your home? (please list) no      Current or past profession:      Do you exercise?         no                             Type & how often? none      Do you have a living will? no   Do you have a DNR form?  no                                If not, do you want to discuss one? no      Do you have signed POA/HPOA for forms?     Outpatient Encounter Medications as of 05/05/2018  Medication Sig  . acetaminophen (TYLENOL) 500 MG tablet Take 500 mg by mouth daily as needed.  Marland Kitchen amLODipine (NORVASC) 10 MG tablet Take 1 tablet (10 mg total) by mouth daily.  Marland Kitchen aspirin EC 81 MG tablet Take 81 mg by mouth daily.  . bumetanide (BUMEX) 2 MG tablet Take 2 mg by mouth daily.  . busPIRone (BUSPAR) 10 MG tablet TAKE 1 TABLET BY MOUTH TWICE DAILY .DX F41.9 APPT IS OVERDUE  . Carboxymethylcellulose Sodium (EYE DROPS OP) Apply to eye as needed.  Marland Kitchen Cod Liver Oil 1000 MG CAPS Take 1 capsule by mouth 2 (two) times daily.   Marland Kitchen donepezil (ARICEPT) 5 MG tablet TAKE 1 TABLET BY MOUTH EVERYDAY AT BEDTIME  . hydrALAZINE (APRESOLINE) 100 MG tablet Take 1 tablet (100 mg total) by mouth 3 (three) times daily.  Marland Kitchen levothyroxine (SYNTHROID, LEVOTHROID) 88 MCG tablet Take 1 tablet (88 mcg total) by mouth daily.  Marland Kitchen losartan (COZAAR) 100 MG tablet TAKE 1 TABLET BY MOUTH EVERYDAY AT BEDTIME  . metoprolol succinate (TOPROL-XL) 100 MG 24 hr tablet TAKE 1 TABLET BY MOUTH EVERYDAY AT BEDTIME  . tamoxifen (NOLVADEX) 20 MG tablet Take 20 mg by mouth daily.  . traZODone (DESYREL) 50 MG tablet Take 1 tablet (50 mg total) by  mouth at bedtime.  . [DISCONTINUED] amLODipine (NORVASC) 10 MG tablet TAKE 1 TABLET BY MOUTH EVERY DAY  . [DISCONTINUED] hydrALAZINE (APRESOLINE) 100 MG tablet Take 1 tablet (100 mg total) by mouth 3 (three) times daily.  . [DISCONTINUED] losartan (COZAAR) 100 MG tablet TAKE 1 TABLET BY MOUTH EVERYDAY AT BEDTIME  . [DISCONTINUED]  Tetrahydrozoline HCl (VISINE OP) Place 1 drop into both eyes daily as needed (dry eyes).   No facility-administered encounter medications on file as of 05/05/2018.     Activities of Daily Living In your present state of health, do you have any difficulty performing the following activities: 05/05/2018 06/04/2017  Hearing? N N  Vision? N N  Difficulty concentrating or making decisions? Y N  Walking or climbing stairs? N Y  Dressing or bathing? N N  Doing errands, shopping? Tempie Donning  Preparing Food and eating ? Y -  Comment difficulty cooking, gets tired fast -  Using the Toilet? N -  In the past six months, have you accidently leaked urine? Y -  Do you have problems with loss of bowel control? N -  Managing your Medications? N -  Managing your Finances? N -  Housekeeping or managing your Housekeeping? N -  Some recent data might be hidden    Patient Care Team: [name], NP as PCP - General (Geriatric Medicine) Jettie Booze, MD as PCP - Cardiology (Cardiology) Dustin Folks, MD as Consulting Physician (Hematology and Oncology) Esperanza Heir, MD as Consulting Physician (Obstetrics and Gynecology) Consuella Lose, MD as Consulting Physician (Neurosurgery) Deterding, Jeneen Rinks, MD as Consulting Physician (Nephrology)    Assessment:   This is a routine wellness examination for Regina Porter.  Exercise Activities and Dietary recommendations Current Exercise Habits: Home exercise routine, Type of exercise: walking, Time (Minutes): 15, Frequency (Times/Week): 3, Weekly Exercise (Minutes/Week): 45, Intensity: Mild, Exercise limited by: orthopedic condition(s)  Goals    . Increase water intake     Starting 11/13/15, I will attempt to increase my water intake from 4 glasses to 6 glasses per day and try to add in exercise (walking around house) dailly.        Fall Risk Fall Risk  05/05/2018 12/24/2017 10/14/2017 07/04/2017 04/28/2017  Falls in the past year? 0 0 No No No   Number falls in past yr: 0 - - - -  Injury with Fall? 0 - - - -   Is the patient's home free of loose throw rugs in walkways, pet beds, electrical cords, etc?   yes      Grab bars in the bathroom? yes      Handrails on the stairs?   yes      Adequate lighting?   yes  Timed Get Up and Go performed: n/a  Depression Screen PHQ 2/9 Scores 05/05/2018 04/28/2017 11/13/2015 08/10/2015  PHQ - 2 Score 0 1 0 0     Cognitive Function MMSE - Mini Mental State Exam 04/28/2017 09/11/2015  Orientation to time 5 4  Orientation to Place 5 4  Registration 3 3  Attention/ Calculation 5 5  Recall 1 2  Language- name 2 objects 2 2  Language- repeat 1 1  Language- follow 3 step command 3 3  Language- read & follow direction 1 1  Write a sentence 1 1  Copy design 1 1  Total score 28 27     6CIT Screen 05/05/2018  What Year? 0 points  What month? 0 points  What time? 0 points  Count back from 20 0 points  Months in reverse 0 points  Repeat phrase 6 points  Total Score 6    Immunization History  Administered Date(s) Administered  . Influenza, High Dose Seasonal PF 01/22/2017, 10/14/2017  . Influenza,inj,Quad PF,6+ Mos 10/26/2014, 09/11/2015  . Influenza-Unspecified 12/03/2013, 10/26/2014  . Pneumococcal Conjugate-13 10/26/2014  . Pneumococcal Polysaccharide-23 12/03/2013, 11/13/2015  . Tdap 01/14/2009  . Zoster 12/03/2013    Qualifies for Shingles Vaccine? No, states she never had chicken pox   Screening Tests Health Maintenance  Topic Date Due  . MAMMOGRAM  06/18/2017  . INFLUENZA VACCINE  08/15/2018  . TETANUS/TDAP  01/15/2019  . DEXA SCAN  Completed  . PNA vac Low Risk Adult  Completed    Cancer Screenings: Lung: Low Dose CT Chest recommended if Age 49-80 years, 30 pack-year currently smoking OR have quit w/in 15years. Patient does not qualify. Breast:  Up to date on Mammogram? No   Up to date of Bone Density/Dexa? No Colorectal: aged out  Additional Screenings: :  Hepatitis C Screening: n/a     Plan:      I have personally reviewed and noted the following in the patient's chart:   . Medical and social history . Use of alcohol, tobacco or illicit drugs  . Current medications and supplements . Functional ability and status . Nutritional status . Physical activity . Advanced directives . List of other physicians . Hospitalizations, surgeries, and ER visits in previous 12 months . Vitals . Screenings to include cognitive, depression, and falls . Referrals and appointments  In addition, I have reviewed and discussed with patient certain preventive protocols, quality metrics, and best practice recommendations. A written personalized care plan for preventive services as well as general preventive health recommendations were provided to patient.     [name], NP  05/05/2018

## 2018-05-05 NOTE — Progress Notes (Signed)
    This service is provided via telemedicine  No vital signs collected/recorded due to the encounter was a telemedicine visit.   Location of patient (ex: home, work): Home  Patient consents to a telephone visit:  Yes  Location of the provider (ex: office, home):  [facility], Office   Name of any referring provider:  N/A  Names of all persons participating in the telemedicine service and their role in the encounter:  S.Chrae B/CMA, Sherrie Mustache, NP, and Patient   Time spent on call:  18 min with medical assistant

## 2018-05-25 DIAGNOSIS — F329 Major depressive disorder, single episode, unspecified: Secondary | ICD-10-CM | POA: Diagnosis not present

## 2018-05-25 DIAGNOSIS — I129 Hypertensive chronic kidney disease with stage 1 through stage 4 chronic kidney disease, or unspecified chronic kidney disease: Secondary | ICD-10-CM | POA: Diagnosis not present

## 2018-05-25 DIAGNOSIS — K219 Gastro-esophageal reflux disease without esophagitis: Secondary | ICD-10-CM | POA: Diagnosis not present

## 2018-05-25 DIAGNOSIS — D631 Anemia in chronic kidney disease: Secondary | ICD-10-CM | POA: Diagnosis not present

## 2018-05-25 DIAGNOSIS — N184 Chronic kidney disease, stage 4 (severe): Secondary | ICD-10-CM | POA: Diagnosis not present

## 2018-05-25 DIAGNOSIS — F419 Anxiety disorder, unspecified: Secondary | ICD-10-CM | POA: Diagnosis not present

## 2018-05-25 DIAGNOSIS — G2581 Restless legs syndrome: Secondary | ICD-10-CM | POA: Diagnosis not present

## 2018-05-25 DIAGNOSIS — N2581 Secondary hyperparathyroidism of renal origin: Secondary | ICD-10-CM | POA: Diagnosis not present

## 2018-05-25 DIAGNOSIS — E039 Hypothyroidism, unspecified: Secondary | ICD-10-CM | POA: Diagnosis not present

## 2018-05-25 DIAGNOSIS — F0390 Unspecified dementia without behavioral disturbance: Secondary | ICD-10-CM | POA: Diagnosis not present

## 2018-05-25 DIAGNOSIS — C50919 Malignant neoplasm of unspecified site of unspecified female breast: Secondary | ICD-10-CM | POA: Diagnosis not present

## 2018-05-25 DIAGNOSIS — I739 Peripheral vascular disease, unspecified: Secondary | ICD-10-CM | POA: Diagnosis not present

## 2018-06-24 ENCOUNTER — Other Ambulatory Visit: Payer: Self-pay | Admitting: Nurse Practitioner

## 2018-07-08 DIAGNOSIS — D649 Anemia, unspecified: Secondary | ICD-10-CM | POA: Diagnosis not present

## 2018-07-08 DIAGNOSIS — Z08 Encounter for follow-up examination after completed treatment for malignant neoplasm: Secondary | ICD-10-CM | POA: Diagnosis not present

## 2018-07-08 DIAGNOSIS — Z79899 Other long term (current) drug therapy: Secondary | ICD-10-CM | POA: Diagnosis not present

## 2018-07-08 DIAGNOSIS — Z853 Personal history of malignant neoplasm of breast: Secondary | ICD-10-CM | POA: Diagnosis not present

## 2018-07-11 ENCOUNTER — Other Ambulatory Visit: Payer: Self-pay | Admitting: Nurse Practitioner

## 2018-07-11 DIAGNOSIS — I10 Essential (primary) hypertension: Secondary | ICD-10-CM

## 2018-08-06 ENCOUNTER — Ambulatory Visit (INDEPENDENT_AMBULATORY_CARE_PROVIDER_SITE_OTHER): Payer: Medicare Other | Admitting: Nurse Practitioner

## 2018-08-06 ENCOUNTER — Encounter: Payer: Self-pay | Admitting: Nurse Practitioner

## 2018-08-06 ENCOUNTER — Other Ambulatory Visit: Payer: Self-pay

## 2018-08-06 VITALS — BP 150/70 | HR 66 | Temp 98.3°F | Ht 64.0 in | Wt 192.0 lb

## 2018-08-06 DIAGNOSIS — H8110 Benign paroxysmal vertigo, unspecified ear: Secondary | ICD-10-CM

## 2018-08-06 DIAGNOSIS — I10 Essential (primary) hypertension: Secondary | ICD-10-CM

## 2018-08-06 DIAGNOSIS — E039 Hypothyroidism, unspecified: Secondary | ICD-10-CM | POA: Diagnosis not present

## 2018-08-06 DIAGNOSIS — R0683 Snoring: Secondary | ICD-10-CM | POA: Diagnosis not present

## 2018-08-06 DIAGNOSIS — N184 Chronic kidney disease, stage 4 (severe): Secondary | ICD-10-CM | POA: Diagnosis not present

## 2018-08-06 DIAGNOSIS — F419 Anxiety disorder, unspecified: Secondary | ICD-10-CM | POA: Diagnosis not present

## 2018-08-06 DIAGNOSIS — E782 Mixed hyperlipidemia: Secondary | ICD-10-CM

## 2018-08-06 NOTE — Progress Notes (Signed)
Careteam: Patient Care Team: [name], NP as PCP - General (Geriatric Medicine) Jettie Booze, MD as PCP - Cardiology (Cardiology) Dustin Folks, MD as Consulting Physician (Hematology and Oncology) Esperanza Heir, MD as Consulting Physician (Obstetrics and Gynecology) Consuella Lose, MD as Consulting Physician (Neurosurgery) Deterding, Jeneen Rinks, MD as Consulting Physician (Nephrology)  Advanced Directive information Does Patient Have a Medical Advance Directive?: No, Would patient like information on creating a medical advance directive?: Yes (MAU/Ambulatory/Procedural Areas - Information given)(Given previously)  Allergies  Allergen Reactions  . Shellfish Allergy Swelling and Rash    MOUTH  . Neomy-Bacit-Polymyx-Pramoxine     UNSPECIFIED REACTION  Not sure which antibiotic she has a reaction to    Chief Complaint  Patient presents with  . Medical Management of Chronic Issues    3 month follow-up, eliquilibrium is off, and patient wakes up in a panic and feels like she is suffocating x 1 month  . Quality Metric Gaps    Discuss need for mammogram   . Immunizations    Discuss need for Shingrix      HPI: Patient is a 84 y.o. female seen in the office today for routine follow up.  Feels like she wakes up in a panic in the middle of the night, feels like she can breath that her air is shut off.  Feels panic, has to open the bedroom room and go into another room.  Once she comes back panic improve.  Daughter feels like she is overwhelmed due to pandemic, she has only gotten out 3 times in months but does not even leave her bedroom. Feels like it is her comfort zone.   Has daytime fatigue, snoring.   Reports having problems with being dizzy. Off and on all day every day for a month. Was in therapy for BPPV and does maneuver she was taught, at first it helped but now it worsened it.   Overall has become weaker  htn- blood pressure 159/66 at home   Review of Systems:  Review of Systems  Constitutional: Negative for chills, fever, malaise/fatigue and weight loss.  HENT: Negative for congestion, hearing loss and tinnitus.   Eyes: Negative for blurred vision.  Respiratory: Negative for cough and shortness of breath.   Cardiovascular: Negative for chest pain, palpitations and leg swelling.  Gastrointestinal: Negative for abdominal pain, blood in stool, constipation, diarrhea and melena.  Genitourinary: Negative for dysuria.  Musculoskeletal: Negative for back pain, falls and joint pain.  Skin: Negative for itching and rash.  Neurological: Negative for dizziness, loss of consciousness, weakness and headaches. Tremors:   Psychiatric/Behavioral: Positive for memory loss. Negative for depression. The patient is nervous/anxious and has insomnia (chronic sleep issues).     Past Medical History:  Diagnosis Date  . Anemia   . Anterolisthesis    grade 1: L3-4  . Anxiety   . Arthritis   . Breast cancer ([place])   . Chronic kidney disease   . Dementia ([place])   . Depression   . Dyspnea    W/ PAIN   . Gastroesophageal reflux disease   . Headache    TO SEE NEURO MD  . Heartburn   . Hypertension   . Hypothyroidism   . Leg cramps   . Post-menopause bleeding   . Restless leg syndrome 10/07/2014   Past Surgical History:  Procedure Laterality Date  . BREAST SURGERY Left    Lumpectomy   . CATARACT EXTRACTION, BILATERAL    . hysteroscopy  biopsy     Social History:   reports that she has quit smoking. She has a 2.00 pack-year smoking history. She has never used smokeless tobacco. She reports that she does not drink alcohol or use drugs.  Family History  Problem Relation Age of Onset  . Hypertension Mother   . Arthritis Mother   . Heart disease Mother   . Hypertension Father   . Heart disease Father   . Cancer Brother   . Breast cancer Paternal Aunt     Medications: Patient's Medications  New Prescriptions   No medications on  file  Previous Medications   ACETAMINOPHEN (TYLENOL) 500 MG TABLET    Take 500 mg by mouth daily as needed.   AMLODIPINE (NORVASC) 10 MG TABLET    Take 1 tablet (10 mg total) by mouth daily.   ASPIRIN EC 81 MG TABLET    Take 81 mg by mouth daily.   BUMETANIDE (BUMEX) 2 MG TABLET    Take 2 mg by mouth daily.   BUSPIRONE (BUSPAR) 10 MG TABLET    TAKE 1 TABLET BY MOUTH TWICE DAILY .DX F41.9 APPT IS OVERDUE   CARBOXYMETHYLCELLULOSE SODIUM (EYE DROPS OP)    Apply to eye as needed.   COD LIVER OIL 1000 MG CAPS    Take 1 capsule by mouth 2 (two) times daily.    DONEPEZIL (ARICEPT) 5 MG TABLET    TAKE 1 TABLET BY MOUTH EVERYDAY AT BEDTIME   HYDRALAZINE (APRESOLINE) 100 MG TABLET    Take 1 tablet (100 mg total) by mouth 3 (three) times daily.   LEVOTHYROXINE (SYNTHROID) 88 MCG TABLET    TAKE 1 TABLET BY MOUTH EVERY DAY   LOSARTAN (COZAAR) 100 MG TABLET    TAKE 1 TABLET BY MOUTH EVERYDAY AT BEDTIME   METOPROLOL SUCCINATE (TOPROL-XL) 100 MG 24 HR TABLET    TAKE 1 TABLET BY MOUTH EVERYDAY AT BEDTIME   TRAZODONE (DESYREL) 50 MG TABLET    Take 1 tablet (50 mg total) by mouth at bedtime.  Modified Medications   No medications on file  Discontinued Medications   TAMOXIFEN (NOLVADEX) 20 MG TABLET    Take 20 mg by mouth daily.    Physical Exam:  Vitals:   08/06/18 1006  BP: (!) 150/70  Pulse: 66  Temp: 98.3 F (36.8 C)  TempSrc: Oral  SpO2: 98%  Weight: 192 lb (87.1 kg)  Height: _0  (1.626 m)   Body mass index is 32.96 kg/m. Wt Readings from Last 3 Encounters:  08/06/18 192 lb (87.1 kg)  12/24/17 184 lb (83.5 kg)  10/28/17 180 lb (81.6 kg)    Physical Exam Constitutional:      General: She is not in acute distress.    Appearance: She is well-developed. She is not diaphoretic.  HENT:     Head: Normocephalic and atraumatic.     Mouth/Throat:     Pharynx: No oropharyngeal exudate.  Eyes:     Conjunctiva/sclera: Conjunctivae normal.     Pupils: Pupils are equal, round, and reactive to  light.  Cardiovascular:     Rate and Rhythm: Normal rate and regular rhythm.     Heart sounds: Murmur present.  Pulmonary:     Effort: Pulmonary effort is normal.     Breath sounds: Normal breath sounds.  Abdominal:     General: Bowel sounds are normal.     Palpations: Abdomen is soft.  Musculoskeletal:        General: No tenderness.  Skin:    General: Skin is warm and dry.  Neurological:     Mental Status: She is alert.  Psychiatric:     Comments: Mild short term memory loss     Labs reviewed: Basic Metabolic Panel: Recent Labs    09/01/17 1335 10/25/17 0127  NA  --  138  K  --  3.8  CL  --  104  CO2  --  26  GLUCOSE  --  116*  BUN  --  30*  CREATININE  --  2.26*  CALCIUM  --  9.3  TSH 0.48  --    Liver Function Tests: Recent Labs    10/25/17 0127  AST 25  ALT 18  ALKPHOS 40  BILITOT 0.5  PROT 7.8  ALBUMIN 3.7   No results for input(s): LIPASE, AMYLASE in the last 8760 hours. No results for input(s): AMMONIA in the last 8760 hours. CBC: Recent Labs    10/25/17 0127  WBC 6.4  NEUTROABS 2.6  HGB 12.1  HCT 38.3  MCV 93.2  PLT 263   Lipid Panel: No results for input(s): CHOL, HDL, LDLCALC, TRIG, CHOLHDL, LDLDIRECT in the last 8760 hours. TSH: Recent Labs    09/01/17 1335  TSH 0.48   A1C: No results found for: HGBA1C   Assessment/Plan 1. Snoring -pt waking up panicked with shortness of breath, she has hx of anxiety and daughter reports she has been somewhat worse due to COVID 19, does not leave her bedroom and has only been out of the house 3 times in the last 3 months.  Encouraged to increase physical activity and try getting out of bedroom more.  However she also has symptoms of daytime fatigue and napping, snoring and obesity. Will refer to pulmonary for evaluation to rule out OSA.  - Ambulatory referral to Pulmonology  2. Anxiety Stable, overall feels like this is controlled on current regimen however based on daughter report she may  have some underlying anxiety, see number 1.  3. Benign paroxysmal positional vertigo, unspecified laterality -worse at this time, okay to use meclizine 12.5 mg every 8 hours as needed.  - Ambulatory referral to [facility] for PT   4. Chronic kidney disease, stage 4 (severe) (HCC) -ongoing follow up with nephrologist.  - CMP with eGFR([facility]) - CBC with Differential/Platelet  5. Hypothyroidism, unspecified type -continues on synthroid 88 mcg - TSH  6. Moderate mixed hyperlipidemia not requiring statin therapy - Lipid panel  7. Hypertension.  -pt reported to nephrologist that she had recently been restarted on losartan 100 mg by mouth daily at tele-visit in May however per our records she had been on losartan 100 mg daily, there has not been any adjustment in medication made by our office and question what medications she has been routinely taking and compliance. She has mild memory loss and daughter states pt primarily does medications. Plans to go home and review pill bottles with medication list. Will continue current regimen per medication list at this time and encouraged dietary compliance.   Next appt: 3 months  Fredy Gladu K. [place], [facility] [phone]

## 2018-08-06 NOTE — Patient Instructions (Addendum)
We will look into the renal ultrasound- it was ordered but not scheduled    DASH Eating Plan DASH stands for "Dietary Approaches to Stop Hypertension." The DASH eating plan is a healthy eating plan that has been shown to reduce high blood pressure (hypertension). It may also reduce your risk for type 2 diabetes, heart disease, and stroke. The DASH eating plan may also help with weight loss. What are tips for following this plan?  General guidelines  Avoid eating more than 2,300 mg (milligrams) of salt (sodium) a day. If you have hypertension, you may need to reduce your sodium intake to 1,500 mg a day.  Limit alcohol intake to no more than 1 drink a day for nonpregnant women and 2 drinks a day for men. One drink equals 12 oz of beer, 5 oz of wine, or 1 oz of hard liquor.  Work with your health care provider to maintain a healthy body weight or to lose weight. Ask what an ideal weight is for you.  Get at least 30 minutes of exercise that causes your heart to beat faster (aerobic exercise) most days of the week. Activities may include walking, swimming, or biking.  Work with your health care provider or diet and nutrition specialist (dietitian) to adjust your eating plan to your individual calorie needs. Reading food labels   Check food labels for the amount of sodium per serving. Choose foods with less than 5 percent of the Daily Value of sodium. Generally, foods with less than 300 mg of sodium per serving fit into this eating plan.  To find whole grains, look for the word "whole" as the first word in the ingredient list. Shopping  Buy products labeled as "low-sodium" or "no salt added."  Buy fresh foods. Avoid canned foods and premade or frozen meals. Cooking  Avoid adding salt when cooking. Use salt-free seasonings or herbs instead of table salt or sea salt. Check with your health care provider or pharmacist before using salt substitutes.  Do not fry foods. Cook foods using healthy  methods such as baking, boiling, grilling, and broiling instead.  Cook with heart-healthy oils, such as olive, canola, soybean, or sunflower oil. Meal planning  Eat a balanced diet that includes: ? 5 or more servings of fruits and vegetables each day. At each meal, try to fill half of your plate with fruits and vegetables. ? Up to 6-8 servings of whole grains each day. ? Less than 6 oz of lean meat, poultry, or fish each day. A 3-oz serving of meat is about the same size as a deck of cards. One egg equals 1 oz. ? 2 servings of low-fat dairy each day. ? A serving of nuts, seeds, or beans 5 times each week. ? Heart-healthy fats. Healthy fats called Omega-3 fatty acids are found in foods such as flaxseeds and coldwater fish, like sardines, salmon, and mackerel.  Limit how much you eat of the following: ? Canned or prepackaged foods. ? Food that is high in trans fat, such as fried foods. ? Food that is high in saturated fat, such as fatty meat. ? Sweets, desserts, sugary drinks, and other foods with added sugar. ? Full-fat dairy products.  Do not salt foods before eating.  Try to eat at least 2 vegetarian meals each week.  Eat more home-cooked food and less restaurant, buffet, and fast food.  When eating at a restaurant, ask that your food be prepared with less salt or no salt, if possible. What foods  are recommended? The items listed may not be a complete list. Talk with your dietitian about what dietary choices are best for you. Grains Whole-grain or whole-wheat bread. Whole-grain or whole-wheat pasta. Brown rice. [place]. Bulgur. Whole-grain and low-sodium cereals. Pita bread. Low-fat, low-sodium crackers. Whole-wheat flour tortillas. Vegetables Fresh or frozen vegetables (raw, steamed, roasted, or grilled). Low-sodium or reduced-sodium tomato and vegetable juice. Low-sodium or reduced-sodium tomato sauce and tomato paste. Low-sodium or reduced-sodium canned  vegetables. Fruits All fresh, dried, or frozen fruit. Canned fruit in natural juice (without added sugar). Meat and other protein foods Skinless chicken or [place]. Ground chicken or [place]. Pork with fat trimmed off. Fish and seafood. Egg whites. Dried beans, peas, or lentils. Unsalted nuts, nut butters, and seeds. Unsalted canned beans. Lean cuts of beef with fat trimmed off. Low-sodium, lean deli meat. Dairy Low-fat (1%) or fat-free (skim) milk. Fat-free, low-fat, or reduced-fat cheeses. Nonfat, low-sodium ricotta or cottage cheese. Low-fat or nonfat yogurt. Low-fat, low-sodium cheese. Fats and oils Soft margarine without trans fats. Vegetable oil. Low-fat, reduced-fat, or light mayonnaise and salad dressings (reduced-sodium). Canola, safflower, olive, soybean, and sunflower oils. Avocado. Seasoning and other foods Herbs. Spices. Seasoning mixes without salt. Unsalted popcorn and pretzels. Fat-free sweets. What foods are not recommended? The items listed may not be a complete list. Talk with your dietitian about what dietary choices are best for you. Grains Baked goods made with fat, such as croissants, muffins, or some breads. Dry pasta or rice meal packs. Vegetables Creamed or fried vegetables. Vegetables in a cheese sauce. Regular canned vegetables (not low-sodium or reduced-sodium). Regular canned tomato sauce and paste (not low-sodium or reduced-sodium). Regular tomato and vegetable juice (not low-sodium or reduced-sodium). Angie Fava. Olives. Fruits Canned fruit in a light or heavy syrup. Fried fruit. Fruit in cream or butter sauce. Meat and other protein foods Fatty cuts of meat. Ribs. Fried meat. Berniece Salines. Sausage. Bologna and other processed lunch meats. Salami. Fatback. Hotdogs. Bratwurst. Salted nuts and seeds. Canned beans with added salt. Canned or smoked fish. Whole eggs or egg yolks. Chicken or [place] with skin. Dairy Whole or 2% milk, cream, and half-and-half. Whole or full-fat  cream cheese. Whole-fat or sweetened yogurt. Full-fat cheese. Nondairy creamers. Whipped toppings. Processed cheese and cheese spreads. Fats and oils Butter. Stick margarine. Lard. Shortening. Ghee. Bacon fat. Tropical oils, such as coconut, palm kernel, or palm oil. Seasoning and other foods Salted popcorn and pretzels. Onion salt, garlic salt, seasoned salt, table salt, and sea salt. Worcestershire sauce. Tartar sauce. Barbecue sauce. Teriyaki sauce. Soy sauce, including reduced-sodium. Steak sauce. Canned and packaged gravies. Fish sauce. Oyster sauce. Cocktail sauce. Horseradish that you find on the shelf. Ketchup. Mustard. Meat flavorings and tenderizers. Bouillon cubes. Hot sauce and Tabasco sauce. Premade or packaged marinades. Premade or packaged taco seasonings. Relishes. Regular salad dressings. Where to find more information:  National Heart, Lung, and [place]: https://wilson-eaton.com/  American Heart Association: www.heart.org Summary  The DASH eating plan is a healthy eating plan that has been shown to reduce high blood pressure (hypertension). It may also reduce your risk for type 2 diabetes, heart disease, and stroke.  With the DASH eating plan, you should limit salt (sodium) intake to 2,300 mg a day. If you have hypertension, you may need to reduce your sodium intake to 1,500 mg a day.  When on the DASH eating plan, aim to eat more fresh fruits and vegetables, whole grains, lean proteins, low-fat dairy, and heart-healthy fats.  Work with your  health care provider or diet and nutrition specialist (dietitian) to adjust your eating plan to your individual calorie needs. This information is not intended to replace advice given to you by your health care provider. Make sure you discuss any questions you have with your health care provider. Document Released: 12/20/2010 Document Revised: 12/13/2016 Document Reviewed: 12/25/2015 Elsevier Patient Education  2020 [employer].

## 2018-08-07 LAB — CBC WITH DIFFERENTIAL/PLATELET
Absolute Monocytes: 409 cells/uL (ref 200–950)
Basophils Absolute: 22 cells/uL (ref 0–200)
Basophils Relative: 0.5 %
Eosinophils Absolute: 110 cells/uL (ref 15–500)
Eosinophils Relative: 2.5 %
HCT: 33.8 % — ABNORMAL LOW (ref 35.0–45.0)
Hemoglobin: 11.2 g/dL — ABNORMAL LOW (ref 11.7–15.5)
Lymphs Abs: 1610 cells/uL (ref 850–3900)
MCH: 30.5 pg (ref 27.0–33.0)
MCHC: 33.1 g/dL (ref 32.0–36.0)
MCV: 92.1 fL (ref 80.0–100.0)
MPV: 10.9 fL (ref 7.5–12.5)
Monocytes Relative: 9.3 %
Neutro Abs: 2248 cells/uL (ref 1500–7800)
Neutrophils Relative %: 51.1 %
Platelets: 260 10*3/uL (ref 140–400)
RBC: 3.67 10*6/uL — ABNORMAL LOW (ref 3.80–5.10)
RDW: 13.5 % (ref 11.0–15.0)
Total Lymphocyte: 36.6 %
WBC: 4.4 10*3/uL (ref 3.8–10.8)

## 2018-08-07 LAB — COMPLETE METABOLIC PANEL WITH GFR
AG Ratio: 1.2 (calc) (ref 1.0–2.5)
ALT: 13 U/L (ref 6–29)
AST: 18 U/L (ref 10–35)
Albumin: 4 g/dL (ref 3.6–5.1)
Alkaline phosphatase (APISO): 34 U/L — ABNORMAL LOW (ref 37–153)
BUN/Creatinine Ratio: 14 (calc) (ref 6–22)
BUN: 33 mg/dL — ABNORMAL HIGH (ref 7–25)
CO2: 26 mmol/L (ref 20–32)
Calcium: 9.2 mg/dL (ref 8.6–10.4)
Chloride: 103 mmol/L (ref 98–110)
Creat: 2.3 mg/dL — ABNORMAL HIGH (ref 0.60–0.88)
GFR, Est African American: 22 mL/min/{1.73_m2} — ABNORMAL LOW (ref >=60)
GFR, Est Non African American: 19 mL/min/{1.73_m2} — ABNORMAL LOW (ref >=60)
Globulin: 3.3 g/dL (calc) (ref 1.9–3.7)
Glucose, Bld: 104 mg/dL (ref 65–139)
Potassium: 4.3 mmol/L (ref 3.5–5.3)
Sodium: 139 mmol/L (ref 135–146)
Total Bilirubin: 0.3 mg/dL (ref 0.2–1.2)
Total Protein: 7.3 g/dL (ref 6.1–8.1)

## 2018-08-07 LAB — LIPID PANEL
Cholesterol: 146 mg/dL (ref <200)
HDL: 42 mg/dL — ABNORMAL LOW (ref >=50)
LDL Cholesterol (Calc): 78 mg/dL (calc)
Non-HDL Cholesterol (Calc): 104 mg/dL (calc) (ref <130)
Total CHOL/HDL Ratio: 3.5 (calc) (ref <5.0)
Triglycerides: 164 mg/dL — ABNORMAL HIGH (ref <150)

## 2018-08-07 LAB — TSH: TSH: 0.37 mIU/L — ABNORMAL LOW (ref 0.40–4.50)

## 2018-08-08 DIAGNOSIS — I129 Hypertensive chronic kidney disease with stage 1 through stage 4 chronic kidney disease, or unspecified chronic kidney disease: Secondary | ICD-10-CM | POA: Diagnosis not present

## 2018-08-08 DIAGNOSIS — F0390 Unspecified dementia without behavioral disturbance: Secondary | ICD-10-CM | POA: Diagnosis not present

## 2018-08-08 DIAGNOSIS — Z853 Personal history of malignant neoplasm of breast: Secondary | ICD-10-CM

## 2018-08-08 DIAGNOSIS — H8113 Benign paroxysmal vertigo, bilateral: Secondary | ICD-10-CM | POA: Diagnosis not present

## 2018-08-08 DIAGNOSIS — F419 Anxiety disorder, unspecified: Secondary | ICD-10-CM

## 2018-08-08 DIAGNOSIS — M545 Low back pain: Secondary | ICD-10-CM | POA: Diagnosis not present

## 2018-08-08 DIAGNOSIS — N184 Chronic kidney disease, stage 4 (severe): Secondary | ICD-10-CM | POA: Diagnosis not present

## 2018-08-08 DIAGNOSIS — Z981 Arthrodesis status: Secondary | ICD-10-CM | POA: Diagnosis not present

## 2018-08-08 DIAGNOSIS — I6523 Occlusion and stenosis of bilateral carotid arteries: Secondary | ICD-10-CM | POA: Diagnosis not present

## 2018-08-10 DIAGNOSIS — I129 Hypertensive chronic kidney disease with stage 1 through stage 4 chronic kidney disease, or unspecified chronic kidney disease: Secondary | ICD-10-CM | POA: Diagnosis not present

## 2018-08-10 DIAGNOSIS — D631 Anemia in chronic kidney disease: Secondary | ICD-10-CM | POA: Diagnosis not present

## 2018-08-10 DIAGNOSIS — N184 Chronic kidney disease, stage 4 (severe): Secondary | ICD-10-CM | POA: Diagnosis not present

## 2018-08-10 DIAGNOSIS — N2581 Secondary hyperparathyroidism of renal origin: Secondary | ICD-10-CM | POA: Diagnosis not present

## 2018-08-11 ENCOUNTER — Other Ambulatory Visit: Payer: Self-pay

## 2018-08-11 DIAGNOSIS — N184 Chronic kidney disease, stage 4 (severe): Secondary | ICD-10-CM

## 2018-08-11 MED ORDER — LEVOTHYROXINE SODIUM 75 MCG PO TABS: 75.0000 ug | ORAL_TABLET | Freq: Every day | ORAL | 0 refills | Status: DC

## 2018-08-12 ENCOUNTER — Telehealth: Payer: Self-pay | Admitting: *Deleted

## 2018-08-12 DIAGNOSIS — N184 Chronic kidney disease, stage 4 (severe): Secondary | ICD-10-CM | POA: Diagnosis not present

## 2018-08-12 DIAGNOSIS — H8113 Benign paroxysmal vertigo, bilateral: Secondary | ICD-10-CM | POA: Diagnosis not present

## 2018-08-12 DIAGNOSIS — I129 Hypertensive chronic kidney disease with stage 1 through stage 4 chronic kidney disease, or unspecified chronic kidney disease: Secondary | ICD-10-CM | POA: Diagnosis not present

## 2018-08-12 DIAGNOSIS — F0390 Unspecified dementia without behavioral disturbance: Secondary | ICD-10-CM | POA: Diagnosis not present

## 2018-08-12 DIAGNOSIS — I6523 Occlusion and stenosis of bilateral carotid arteries: Secondary | ICD-10-CM | POA: Diagnosis not present

## 2018-08-12 DIAGNOSIS — M545 Low back pain: Secondary | ICD-10-CM | POA: Diagnosis not present

## 2018-08-12 NOTE — Telephone Encounter (Signed)
Patient notified and agreed. Patient Wants referrals to cancel the appointment and update.   Forwarded to [employer].

## 2018-08-12 NOTE — Telephone Encounter (Signed)
Patient called and stated that she was seen and you suggested her getting a renal ultrasound. Patient stated that on her way home from the appointment she received a call from our office stating that you had changed your mind and canceled the Ultrasound. Now she is getting a call from the imaging place stating that she has an appointment for the renal ultrasound. Patient stated that she is confused. Please Advise.

## 2018-08-12 NOTE — Telephone Encounter (Signed)
This should have been cancelled, I notified pt and told Regina Porter. She is following with nephrologist and had one in 2018.

## 2018-08-14 NOTE — Telephone Encounter (Signed)
I called [facility] at [phone] and canceled appointment for U/S   Patient aware appointment canceled

## 2018-08-15 DIAGNOSIS — I129 Hypertensive chronic kidney disease with stage 1 through stage 4 chronic kidney disease, or unspecified chronic kidney disease: Secondary | ICD-10-CM | POA: Diagnosis not present

## 2018-08-15 DIAGNOSIS — N184 Chronic kidney disease, stage 4 (severe): Secondary | ICD-10-CM | POA: Diagnosis not present

## 2018-08-15 DIAGNOSIS — M545 Low back pain: Secondary | ICD-10-CM | POA: Diagnosis not present

## 2018-08-15 DIAGNOSIS — F0390 Unspecified dementia without behavioral disturbance: Secondary | ICD-10-CM | POA: Diagnosis not present

## 2018-08-15 DIAGNOSIS — H8113 Benign paroxysmal vertigo, bilateral: Secondary | ICD-10-CM | POA: Diagnosis not present

## 2018-08-15 DIAGNOSIS — I6523 Occlusion and stenosis of bilateral carotid arteries: Secondary | ICD-10-CM | POA: Diagnosis not present

## 2018-08-17 ENCOUNTER — Other Ambulatory Visit: Payer: Medicare Other

## 2018-08-18 ENCOUNTER — Other Ambulatory Visit: Payer: Self-pay

## 2018-08-18 ENCOUNTER — Other Ambulatory Visit: Payer: Medicare Other

## 2018-08-18 DIAGNOSIS — F0390 Unspecified dementia without behavioral disturbance: Secondary | ICD-10-CM | POA: Diagnosis not present

## 2018-08-18 DIAGNOSIS — N184 Chronic kidney disease, stage 4 (severe): Secondary | ICD-10-CM | POA: Diagnosis not present

## 2018-08-18 DIAGNOSIS — I129 Hypertensive chronic kidney disease with stage 1 through stage 4 chronic kidney disease, or unspecified chronic kidney disease: Secondary | ICD-10-CM | POA: Diagnosis not present

## 2018-08-18 DIAGNOSIS — H8113 Benign paroxysmal vertigo, bilateral: Secondary | ICD-10-CM | POA: Diagnosis not present

## 2018-08-18 DIAGNOSIS — I6523 Occlusion and stenosis of bilateral carotid arteries: Secondary | ICD-10-CM | POA: Diagnosis not present

## 2018-08-18 DIAGNOSIS — M545 Low back pain: Secondary | ICD-10-CM | POA: Diagnosis not present

## 2018-08-18 LAB — CBC
HCT: 31.3 % — ABNORMAL LOW (ref 35.0–45.0)
Hemoglobin: 10.6 g/dL — ABNORMAL LOW (ref 11.7–15.5)
MCH: 31.5 pg (ref 27.0–33.0)
MCHC: 33.9 g/dL (ref 32.0–36.0)
MCV: 92.9 fL (ref 80.0–100.0)
MPV: 10.5 fL (ref 7.5–12.5)
Platelets: 228 10*3/uL (ref 140–400)
RBC: 3.37 10*6/uL — ABNORMAL LOW (ref 3.80–5.10)
RDW: 13.6 % (ref 11.0–15.0)
WBC: 4.1 10*3/uL (ref 3.8–10.8)

## 2018-08-20 DIAGNOSIS — M545 Low back pain: Secondary | ICD-10-CM | POA: Diagnosis not present

## 2018-08-20 DIAGNOSIS — H8113 Benign paroxysmal vertigo, bilateral: Secondary | ICD-10-CM | POA: Diagnosis not present

## 2018-08-20 DIAGNOSIS — I6523 Occlusion and stenosis of bilateral carotid arteries: Secondary | ICD-10-CM | POA: Diagnosis not present

## 2018-08-20 DIAGNOSIS — N184 Chronic kidney disease, stage 4 (severe): Secondary | ICD-10-CM | POA: Diagnosis not present

## 2018-08-20 DIAGNOSIS — I129 Hypertensive chronic kidney disease with stage 1 through stage 4 chronic kidney disease, or unspecified chronic kidney disease: Secondary | ICD-10-CM | POA: Diagnosis not present

## 2018-08-20 DIAGNOSIS — F0390 Unspecified dementia without behavioral disturbance: Secondary | ICD-10-CM | POA: Diagnosis not present

## 2018-08-21 ENCOUNTER — Other Ambulatory Visit: Payer: Self-pay

## 2018-08-21 DIAGNOSIS — D649 Anemia, unspecified: Secondary | ICD-10-CM

## 2018-08-21 DIAGNOSIS — N184 Chronic kidney disease, stage 4 (severe): Secondary | ICD-10-CM

## 2018-08-25 DIAGNOSIS — H8113 Benign paroxysmal vertigo, bilateral: Secondary | ICD-10-CM | POA: Diagnosis not present

## 2018-08-25 DIAGNOSIS — I129 Hypertensive chronic kidney disease with stage 1 through stage 4 chronic kidney disease, or unspecified chronic kidney disease: Secondary | ICD-10-CM | POA: Diagnosis not present

## 2018-08-25 DIAGNOSIS — M545 Low back pain: Secondary | ICD-10-CM | POA: Diagnosis not present

## 2018-08-25 DIAGNOSIS — N184 Chronic kidney disease, stage 4 (severe): Secondary | ICD-10-CM | POA: Diagnosis not present

## 2018-08-25 DIAGNOSIS — I6523 Occlusion and stenosis of bilateral carotid arteries: Secondary | ICD-10-CM | POA: Diagnosis not present

## 2018-08-25 DIAGNOSIS — F0390 Unspecified dementia without behavioral disturbance: Secondary | ICD-10-CM | POA: Diagnosis not present

## 2018-08-26 ENCOUNTER — Other Ambulatory Visit: Payer: Self-pay

## 2018-08-26 ENCOUNTER — Telehealth: Payer: Self-pay | Admitting: *Deleted

## 2018-08-26 ENCOUNTER — Ambulatory Visit (INDEPENDENT_AMBULATORY_CARE_PROVIDER_SITE_OTHER): Payer: Medicare Other | Admitting: Nurse Practitioner

## 2018-08-26 ENCOUNTER — Encounter: Payer: Self-pay | Admitting: Nurse Practitioner

## 2018-08-26 VITALS — BP 150/78 | HR 65 | Temp 98.3°F | Resp 10 | Ht 64.0 in | Wt 191.0 lb

## 2018-08-26 DIAGNOSIS — N939 Abnormal uterine and vaginal bleeding, unspecified: Secondary | ICD-10-CM | POA: Diagnosis not present

## 2018-08-26 DIAGNOSIS — Z1231 Encounter for screening mammogram for malignant neoplasm of breast: Secondary | ICD-10-CM | POA: Diagnosis not present

## 2018-08-26 NOTE — Progress Notes (Signed)
Careteam: Patient Care Team: [name], NP as PCP - General (Geriatric Medicine) Jettie Booze, MD as PCP - Cardiology (Cardiology) Dustin Folks, MD as Consulting Physician (Hematology and Oncology) Esperanza Heir, MD as Consulting Physician (Obstetrics and Gynecology) Consuella Lose, MD as Consulting Physician (Neurosurgery) Deterding, Jeneen Rinks, MD as Consulting Physician (Nephrology)  Advanced Directive information    Allergies  Allergen Reactions  . Shellfish Allergy Swelling and Rash    MOUTH  . Neomy-Bacit-Polymyx-Pramoxine     UNSPECIFIED REACTION  Not sure which antibiotic she has a reaction to    Chief Complaint  Patient presents with  . Vaginal Bleeding    Ongoing bleeding/discharge for several years. Bleeding worse  . Quality Metric Gaps    Mammogram overdue      HPI: Patient is a 84 y.o. female seen in the office today due to vaginal bleeding. Pt with hx of anemia, breast cancer. She has had worsening anemia over the last several labs but stable without acute decline.  Pt with hx of breast cancer and was started on tamoxifen after her radiation 5 years ago and this is when the spotting started. She notified her oncologist on the spotting and he sent her for further testing which did not reveal anything (3 year ago).   She has been off tamoxifen since 07/08/2018 per oncologist (completed treatment) and bleeding has now gotten worse.  Prior would have dark discharge every other day, now it is occurring daily and more often.    Review of Systems:  Review of Systems  Constitutional: Negative for chills, fever and weight loss.  Respiratory: Negative for shortness of breath.   Gastrointestinal: Negative for abdominal pain, blood in stool, constipation and diarrhea.  Genitourinary: Negative for dysuria, frequency and urgency.   Past Medical History:  Diagnosis Date  . Anemia   . Anterolisthesis    grade 1: L3-4  . Anxiety   .  Arthritis   . Breast cancer ([place])   . Chronic kidney disease   . Dementia ([place])   . Depression   . Dyspnea    W/ PAIN   . Gastroesophageal reflux disease   . Headache    TO SEE NEURO MD  . Heartburn   . Hypertension   . Hypothyroidism   . Leg cramps   . Post-menopause bleeding   . Restless leg syndrome 10/07/2014   Past Surgical History:  Procedure Laterality Date  . BREAST SURGERY Left    Lumpectomy   . CATARACT EXTRACTION, BILATERAL    . hysteroscopy biopsy     Social History:   reports that she has quit smoking. She has a 2.00 pack-year smoking history. She has never used smokeless tobacco. She reports that she does not drink alcohol or use drugs.  Family History  Problem Relation Age of Onset  . Hypertension Mother   . Arthritis Mother   . Heart disease Mother   . Hypertension Father   . Heart disease Father   . Cancer Brother   . Breast cancer Paternal Aunt     Medications: Patient's Medications  New Prescriptions   No medications on file  Previous Medications   ACETAMINOPHEN (TYLENOL) 500 MG TABLET    Take 500 mg by mouth daily as needed.   AMLODIPINE (NORVASC) 10 MG TABLET    Take 1 tablet (10 mg total) by mouth daily.   ASPIRIN EC 81 MG TABLET    Take 81 mg by mouth daily.   BUMETANIDE (BUMEX)  2 MG TABLET    Take 2 mg by mouth daily.   BUSPIRONE (BUSPAR) 10 MG TABLET    TAKE 1 TABLET BY MOUTH TWICE DAILY .DX F41.9 APPT IS OVERDUE   CARBOXYMETHYLCELLULOSE SODIUM (EYE DROPS OP)    Apply to eye as needed.   COD LIVER OIL 1000 MG CAPS    Take 1 capsule by mouth 2 (two) times daily.    DONEPEZIL (ARICEPT) 5 MG TABLET    TAKE 1 TABLET BY MOUTH EVERYDAY AT BEDTIME   HYDRALAZINE (APRESOLINE) 100 MG TABLET    Take 1 tablet (100 mg total) by mouth 3 (three) times daily.   LEVOTHYROXINE (SYNTHROID) 75 MCG TABLET    Take 1 tablet (75 mcg total) by mouth daily.   LOSARTAN (COZAAR) 100 MG TABLET    TAKE 1 TABLET BY MOUTH EVERYDAY AT BEDTIME   METOPROLOL SUCCINATE  (TOPROL-XL) 100 MG 24 HR TABLET    TAKE 1 TABLET BY MOUTH EVERYDAY AT BEDTIME   TRAZODONE (DESYREL) 50 MG TABLET    Take 1 tablet (50 mg total) by mouth at bedtime.  Modified Medications   No medications on file  Discontinued Medications   No medications on file    Physical Exam:  Vitals:   08/26/18 1336  BP: (!) 150/78  Pulse: 65  Temp: 98.3 F (36.8 C)  TempSrc: Oral  SpO2: 98%  Weight: 191 lb (86.6 kg)  Height: 5\' 4"  (1.626 m)   Body mass index is 32.79 kg/m. Wt Readings from Last 3 Encounters:  08/26/18 191 lb (86.6 kg)  08/06/18 192 lb (87.1 kg)  12/24/17 184 lb (83.5 kg)    Physical Exam Constitutional:      General: She is not in acute distress.    Appearance: She is well-developed. She is not diaphoretic.  HENT:     Head: Normocephalic and atraumatic.     Mouth/Throat:     Pharynx: No oropharyngeal exudate.  Eyes:     Conjunctiva/sclera: Conjunctivae normal.     Pupils: Pupils are equal, round, and reactive to light.  Cardiovascular:     Rate and Rhythm: Normal rate and regular rhythm.     Heart sounds: Murmur present.  Pulmonary:     Effort: Pulmonary effort is normal.     Breath sounds: Normal breath sounds.  Abdominal:     General: Bowel sounds are normal. There is no distension.     Palpations: Abdomen is soft. There is no mass.     Tenderness: There is no abdominal tenderness. There is no guarding.  Genitourinary:    Comments: Defers GU exam to GYN Musculoskeletal:        General: No tenderness.  Skin:    General: Skin is warm and dry.  Neurological:     Mental Status: She is alert.  Psychiatric:     Comments: Mild short term memory loss     Labs reviewed: Basic Metabolic Panel: Recent Labs    09/01/17 1335 10/25/17 0127 08/06/18 1053  NA  --  138 139  K  --  3.8 4.3  CL  --  104 103  CO2  --  26 26  GLUCOSE  --  116* 104  BUN  --  30* 33*  CREATININE  --  2.26* 2.30*  CALCIUM  --  9.3 9.2  TSH 0.48  --  0.37*   Liver  Function Tests: Recent Labs    10/25/17 0127 08/06/18 1053  AST 25 18  ALT 18 13  ALKPHOS 40  --   BILITOT 0.5 0.3  PROT 7.8 7.3  ALBUMIN 3.7  --    No results for input(s): LIPASE, AMYLASE in the last 8760 hours. No results for input(s): AMMONIA in the last 8760 hours. CBC: Recent Labs    10/25/17 0127 08/06/18 1053 08/18/18 0932  WBC 6.4 4.4 4.1  NEUTROABS 2.6 2,248  --   HGB 12.1 11.2* 10.6*  HCT 38.3 33.8* 31.3*  MCV 93.2 92.1 92.9  PLT 263 260 228   Lipid Panel: Recent Labs    08/06/18 1053  CHOL 146  HDL 42*  LDLCALC 78  TRIG 164*  CHOLHDL 3.5   TSH: Recent Labs    09/01/17 1335 08/06/18 1053  TSH 0.48 0.37*   A1C: No results found for: HGBA1C   Assessment/Plan 1. Screening mammogram, encounter for -hx of breast cancer, following with oncologist, has completed tamoxifen.  - MM DIGITAL SCREENING BILATERAL; Future  2. Vaginal bleeding -started ~5 years ago when she started tamoxifen, she had a work up by GYN which was benign. I do not see the records for this in our system. Daughter unsure who the GYN was that did the work up but will call and let us know. We will refer back to GYN at this time due to worsening bleeding since she has been off tamoxifen   3. Anemia Had trended down on recent lab, she has not completed ifob but will bring this back to office once complete. Possible related to CKD vs vaginal bleeding.   Next appt: as scheduled. 11/11/2018 Carlos American. [place], [facility] [phone]

## 2018-08-26 NOTE — Telephone Encounter (Signed)
Patient called and stated that they have been unable to get the name of her previous Urologist as you asked. Stated to just refer her to someone else.

## 2018-08-26 NOTE — Patient Instructions (Signed)
Please call us with the name of the GYN you saw in the past for bleeding and we will place another referral

## 2018-08-26 NOTE — Telephone Encounter (Signed)
It was a GYN but we will send over referral. Thank you

## 2018-08-26 NOTE — Addendum Note (Signed)
Addended by: [name] on: 08/26/2018 03:45 PM   Modules accepted: Orders

## 2018-08-31 DIAGNOSIS — F0390 Unspecified dementia without behavioral disturbance: Secondary | ICD-10-CM | POA: Diagnosis not present

## 2018-08-31 DIAGNOSIS — H8113 Benign paroxysmal vertigo, bilateral: Secondary | ICD-10-CM | POA: Diagnosis not present

## 2018-08-31 DIAGNOSIS — I129 Hypertensive chronic kidney disease with stage 1 through stage 4 chronic kidney disease, or unspecified chronic kidney disease: Secondary | ICD-10-CM | POA: Diagnosis not present

## 2018-08-31 DIAGNOSIS — I6523 Occlusion and stenosis of bilateral carotid arteries: Secondary | ICD-10-CM | POA: Diagnosis not present

## 2018-08-31 DIAGNOSIS — N184 Chronic kidney disease, stage 4 (severe): Secondary | ICD-10-CM | POA: Diagnosis not present

## 2018-08-31 DIAGNOSIS — M545 Low back pain: Secondary | ICD-10-CM | POA: Diagnosis not present

## 2018-09-03 ENCOUNTER — Other Ambulatory Visit: Payer: Self-pay | Admitting: Nurse Practitioner

## 2018-09-03 DIAGNOSIS — D649 Anemia, unspecified: Secondary | ICD-10-CM | POA: Diagnosis not present

## 2018-09-05 LAB — FECAL GLOBIN BY IMMUNOCHEMISTRY
FECAL GLOBIN RESULT:: DETECTED — AB
MICRO NUMBER:: 798083
SPECIMEN QUALITY:: ADEQUATE

## 2018-09-07 ENCOUNTER — Other Ambulatory Visit: Payer: Self-pay | Admitting: Nurse Practitioner

## 2018-09-07 DIAGNOSIS — D649 Anemia, unspecified: Secondary | ICD-10-CM

## 2018-09-07 DIAGNOSIS — R195 Other fecal abnormalities: Secondary | ICD-10-CM

## 2018-09-10 ENCOUNTER — Encounter: Payer: Self-pay | Admitting: Physician Assistant

## 2018-09-16 ENCOUNTER — Encounter: Payer: Self-pay | Admitting: Pulmonary Disease

## 2018-09-16 ENCOUNTER — Other Ambulatory Visit: Payer: Self-pay

## 2018-09-16 ENCOUNTER — Ambulatory Visit (INDEPENDENT_AMBULATORY_CARE_PROVIDER_SITE_OTHER): Payer: Medicare Other | Admitting: Pulmonary Disease

## 2018-09-16 VITALS — BP 126/70 | HR 67 | Temp 98.2°F | Ht 64.0 in | Wt 194.4 lb

## 2018-09-16 DIAGNOSIS — G479 Sleep disorder, unspecified: Secondary | ICD-10-CM

## 2018-09-16 NOTE — Progress Notes (Signed)
Regina Porter    [id number]    1935/01/04  Primary Care Physician:Eubanks, Carlos American, NP  Referring Physician: [name], NP [place],  [place] [address]  Chief complaint:   Patient with a history of sleep maintenance insomnia Nonrestorative sleep  HPI:  She wakes up sometimes in a panic She does wait till late to go to bed because she cannot fall asleep easily but once she is asleep she signed sleep soundly, wakes up out of sleep in a panic-with shortness of breath Usually tries to go to bed by 3 AM-sleeps for an hour or 2 and then wakes up, able to fall asleep again after may be an hour Final awakening time about 8 AM She feels she sleeps soundly whenever she is finally asleep Denies any headaches in the mornings No dryness of her mouth in the mornings She feels her memory is stable  She does have a sibling with obstructive sleep apnea  History of back surgery-does not think the pain is contributing to a nonrestorative sleep  Pets: None Occupation: None pertinent  Outpatient Encounter Medications as of 09/16/2018  Medication Sig  . acetaminophen (TYLENOL) 500 MG tablet Take 500 mg by mouth daily as needed.  Marland Kitchen amLODipine (NORVASC) 10 MG tablet Take 1 tablet (10 mg total) by mouth daily.  . bumetanide (BUMEX) 2 MG tablet Take 2 mg by mouth daily.  . busPIRone (BUSPAR) 10 MG tablet TAKE 1 TABLET BY MOUTH TWICE DAILY .DX F41.9 APPT IS OVERDUE  . Carboxymethylcellulose Sodium (EYE DROPS OP) Apply to eye as needed.  Marland Kitchen Cod Liver Oil 1000 MG CAPS Take 1 capsule by mouth 2 (two) times daily.   Marland Kitchen donepezil (ARICEPT) 5 MG tablet TAKE 1 TABLET BY MOUTH EVERYDAY AT BEDTIME  . hydrALAZINE (APRESOLINE) 100 MG tablet Take 1 tablet (100 mg total) by mouth 3 (three) times daily.  Marland Kitchen levothyroxine (SYNTHROID) 75 MCG tablet TAKE 1 TABLET BY MOUTH EVERY DAY  . losartan (COZAAR) 100 MG tablet TAKE 1 TABLET BY MOUTH EVERYDAY AT BEDTIME  . metoprolol succinate  (TOPROL-XL) 100 MG 24 hr tablet TAKE 1 TABLET BY MOUTH EVERYDAY AT BEDTIME  . traZODone (DESYREL) 50 MG tablet Take 1 tablet (50 mg total) by mouth at bedtime.  . [DISCONTINUED] aspirin EC 81 MG tablet Take 81 mg by mouth daily.   No facility-administered encounter medications on file as of 09/16/2018.     Allergies as of 09/16/2018 - Review Complete 09/16/2018  Allergen Reaction Noted  . Shellfish allergy Swelling and Rash 06/03/2017  . Neomy-bacit-polymyx-pramoxine  09/14/2014    Past Medical History:  Diagnosis Date  . Anemia   . Anterolisthesis    grade 1: L3-4  . Anxiety   . Arthritis   . Breast cancer ([place])   . Chronic kidney disease   . Dementia ([place])   . Depression   . Dyspnea    W/ PAIN   . Gastroesophageal reflux disease   . Headache    TO SEE NEURO MD  . Heartburn   . Hypertension   . Hypothyroidism   . Leg cramps   . Post-menopause bleeding   . Restless leg syndrome 10/07/2014    Past Surgical History:  Procedure Laterality Date  . BREAST SURGERY Left    Lumpectomy   . CATARACT EXTRACTION, BILATERAL    . hysteroscopy biopsy      Family History  Problem Relation Age of Onset  .  Hypertension Mother   . Arthritis Mother   . Heart disease Mother   . Hypertension Father   . Heart disease Father   . Cancer Brother   . Breast cancer Paternal Aunt     Social History   Socioeconomic History  . Marital status: Widowed    Spouse name: Not on file  . Number of children: 5  . Years of education: 76  . Highest education level: Not on file  Occupational History  . Occupation: Retired  Scientific laboratory technician  . Financial resource strain: Not hard at all  . Food insecurity    Worry: Never true    Inability: Never true  . Transportation needs    Medical: No    Non-medical: No  Tobacco Use  . Smoking status: Former Smoker    Packs/day: 0.10    Years: 20.00    Pack years: 2.00    Quit date: 2008    Years since quitting: 12.6  . Smokeless tobacco: Never  Used  . Tobacco comment: Quit at  age 37  Substance and Sexual Activity  . Alcohol use: No    Alcohol/week: 0.0 standard drinks  . Drug use: No  . Sexual activity: Never  Lifestyle  . Physical activity    Days per week: 0 days    Minutes per session: 0 min  . Stress: Only a little  Relationships  . Social connections    Talks on phone: More than three times a week    Gets together: More than three times a week    Attends religious service: Never    Active member of club or organization: No    Attends meetings of clubs or organizations: Never    Relationship status: Widowed  . Intimate partner violence    Fear of current or ex partner: No    Emotionally abused: No    Physically abused: No    Forced sexual activity: No  Other Topics Concern  . Not on file  Social History Narrative   Diet?       Do you drink/eat things with caffeine? yes      Marital status?                widow                    What year were you married?      Do you live in a house, apartment, assisted living, condo, trailer, etc.? house      Is it one or more stories? yes      How many persons live in your home? 2      Do you have any pets in your home? (please list) no      Current or past profession:      Do you exercise?         no                             Type & how often? none      Do you have a living will? no   Do you have a DNR form?  no                                If not, do you want to discuss one? no      Do you have signed POA/HPOA for  forms?     Review of Systems  Constitutional: Negative.   HENT: Negative.   Eyes: Negative.   Respiratory: Negative.  Negative for cough and shortness of breath.   Cardiovascular: Negative.   Gastrointestinal: Negative.   Psychiatric/Behavioral: Positive for sleep disturbance.    Vitals:   09/16/18 1423 09/16/18 1427  BP:  126/70  Pulse:  67  Temp: 98.2 F (36.8 C)   SpO2:  100%     Physical Exam  Constitutional: She appears  well-developed and well-nourished.  HENT:  Head: Normocephalic and atraumatic.  Eyes: Pupils are equal, round, and reactive to light. Conjunctivae are normal. Right eye exhibits no discharge. Left eye exhibits no discharge.  Neck: Neck supple. No tracheal deviation present. No thyromegaly present.  Cardiovascular: Normal rate and regular rhythm.  Pulmonary/Chest: Effort normal and breath sounds normal. No respiratory distress. She has no wheezes. She has no rales.  Abdominal: Soft. Bowel sounds are normal. She exhibits no distension. There is no abdominal tenderness. There is no rebound.   ESS of 1   Assessment:  Sleep onset and sleep maintenance insomnia  Nonrestorative sleep  Moderate probability of obstructive sleep apnea  Plan/Recommendations: Pathophysiology of sleep disordered breathing discussed with the patient  Treatment options discussed with the patient  We will schedule patient for home sleep study  If studies negative for significant sleep disordered breathing data medication that may help with a sleep onset and sleep maintenance insomnia may be tried  It is likely that she is waking up in the middle of the night relating to sleep disordered breathing  Encouraged to call with any significant concerns   Sherrilyn Rist MD [facility] 09/16/2018, 2:41 PM  CC: [name], NP

## 2018-09-16 NOTE — Patient Instructions (Signed)
Sleep maintenance insomnia  Moderate probability of significant sleep disordered breathing  We will get a home sleep study  Depending on findings, you may need a CPAP for treatment of sleep disordered breathing  If negative, then medications to help you stay asleep will be tried  I will see you back in about 6 to 8 weeks

## 2018-09-24 ENCOUNTER — Encounter: Payer: Self-pay | Admitting: Physician Assistant

## 2018-09-24 ENCOUNTER — Other Ambulatory Visit: Payer: Self-pay

## 2018-09-24 ENCOUNTER — Other Ambulatory Visit (INDEPENDENT_AMBULATORY_CARE_PROVIDER_SITE_OTHER): Payer: Medicare Other

## 2018-09-24 ENCOUNTER — Ambulatory Visit (INDEPENDENT_AMBULATORY_CARE_PROVIDER_SITE_OTHER): Payer: Medicare Other | Admitting: Physician Assistant

## 2018-09-24 VITALS — BP 142/58 | HR 66 | Temp 98.6°F | Ht 66.0 in | Wt 194.0 lb

## 2018-09-24 DIAGNOSIS — K219 Gastro-esophageal reflux disease without esophagitis: Secondary | ICD-10-CM | POA: Diagnosis not present

## 2018-09-24 DIAGNOSIS — D649 Anemia, unspecified: Secondary | ICD-10-CM | POA: Diagnosis not present

## 2018-09-24 DIAGNOSIS — R195 Other fecal abnormalities: Secondary | ICD-10-CM

## 2018-09-24 LAB — CBC WITH DIFFERENTIAL/PLATELET
Basophils Absolute: 0 10*3/uL (ref 0.0–0.1)
Basophils Relative: 0.5 % (ref 0.0–3.0)
Eosinophils Absolute: 0.1 10*3/uL (ref 0.0–0.7)
Eosinophils Relative: 2 % (ref 0.0–5.0)
HCT: 35.2 % — ABNORMAL LOW (ref 36.0–46.0)
Hemoglobin: 12 g/dL (ref 12.0–15.0)
Lymphocytes Relative: 43.5 % (ref 12.0–46.0)
Lymphs Abs: 1.8 10*3/uL (ref 0.7–4.0)
MCHC: 34.1 g/dL (ref 30.0–36.0)
MCV: 92.6 fl (ref 78.0–100.0)
Monocytes Absolute: 0.5 10*3/uL (ref 0.1–1.0)
Monocytes Relative: 12.8 % — ABNORMAL HIGH (ref 3.0–12.0)
Neutro Abs: 1.7 10*3/uL (ref 1.4–7.7)
Neutrophils Relative %: 41.2 % — ABNORMAL LOW (ref 43.0–77.0)
Platelets: 261 10*3/uL (ref 150.0–400.0)
RBC: 3.8 Mil/uL — ABNORMAL LOW (ref 3.87–5.11)
RDW: 13.9 % (ref 11.5–15.5)
WBC: 4.2 10*3/uL (ref 4.0–10.5)

## 2018-09-24 LAB — IBC + FERRITIN
Ferritin: 231.2 ng/mL (ref 10.0–291.0)
Iron: 73 ug/dL (ref 42–145)
Saturation Ratios: 21.5 % (ref 20.0–50.0)
Transferrin: 243 mg/dL (ref 212.0–360.0)

## 2018-09-24 LAB — VITAMIN B12: Vitamin B-12: 606 pg/mL (ref 211–911)

## 2018-09-24 LAB — FOLATE: Folate: >24.7 ng/mL (ref >5.9)

## 2018-09-24 NOTE — Progress Notes (Signed)
Subjective:    Patient ID: Regina Porter, female    DOB: 1934/11/18, 84 y.o.   MRN: [id number]  HPI Regina Porter is a pleasant 84 y.o. African-American female, new to GI today and referred by Sherrie Mustache, NP for evaluation of normocytic anemia and heme positive stool. Patient does not believe she has ever had prior endoscopic evaluation.  She is not aware of any prior history of iron deficiency or anemia. She does have history of hypertension, hypothyroidism, chronic kidney disease stage III, carotid artery stenosis, her history of breast cancer and mild dementia. Reviewing labs she had hemoglobin of 12.1 in October 2019. On 08/06/2018 hemoglobin 11.2/hematocrit 33.8 and MCV of 92 On 08/18/2018 hemoglobin 10.6/hematocrit 31.3. Fecal occult blood testing was positive. Patient denies any regular NSAID use, she had been on a baby aspirin which had been stopped.  No blood thinners. She denies any problems with abdominal pain or discomfort, no problems with changes in bowel habits and no melena or hematochezia.  Appetite has been good, weight has been stable.  She does admit to chronic problems with heartburn and indigestion on a daily basis.  She has not had any dysphagia or odynophagia.  Generally symptoms have been worse postprandially.  She had previously been on ranitidine but has not been taking anything since that was taken off the market.  She does mention that she has been having some very minor vaginal spotting which is been occurring intermittently over the past 6 to 7 years since she had radiation for breast cancer.  She has not seen a gynecologist in some time.   Review of Systems Pertinent positive and negative review of systems were noted in the above HPI section.  All other review of systems was otherwise negative.  Outpatient Encounter Medications as of 09/24/2018  Medication Sig  . acetaminophen (TYLENOL) 500 MG tablet Take 500 mg by mouth daily as needed.  Marland Kitchen amLODipine (NORVASC) 10 MG  tablet Take 1 tablet (10 mg total) by mouth daily.  . bumetanide (BUMEX) 2 MG tablet Take 2 mg by mouth daily.  . busPIRone (BUSPAR) 10 MG tablet TAKE 1 TABLET BY MOUTH TWICE DAILY .DX F41.9 APPT IS OVERDUE  . Carboxymethylcellulose Sodium (EYE DROPS OP) Apply to eye as needed.  Marland Kitchen Cod Liver Oil 1000 MG CAPS Take 1 capsule by mouth 2 (two) times daily.   Marland Kitchen donepezil (ARICEPT) 5 MG tablet TAKE 1 TABLET BY MOUTH EVERYDAY AT BEDTIME  . hydrALAZINE (APRESOLINE) 100 MG tablet Take 1 tablet (100 mg total) by mouth 3 (three) times daily.  Marland Kitchen levothyroxine (SYNTHROID) 75 MCG tablet TAKE 1 TABLET BY MOUTH EVERY DAY  . losartan (COZAAR) 100 MG tablet TAKE 1 TABLET BY MOUTH EVERYDAY AT BEDTIME  . metoprolol succinate (TOPROL-XL) 100 MG 24 hr tablet TAKE 1 TABLET BY MOUTH EVERYDAY AT BEDTIME  . traZODone (DESYREL) 50 MG tablet Take 1 tablet (50 mg total) by mouth at bedtime.   No facility-administered encounter medications on file as of 09/24/2018.    Allergies  Allergen Reactions  . Shellfish Allergy Swelling and Rash    MOUTH  . Neomy-Bacit-Polymyx-Pramoxine     UNSPECIFIED REACTION  Not sure which antibiotic she has a reaction to   Patient Active Problem List   Diagnosis Date Noted  . Chronic kidney disease, stage 4 (severe) ([place]) 05/05/2018  . Status post lumbar spinal fusion 08/26/2017  . Bilateral carotid artery stenosis 08/30/2016  . Chronic midline low back pain without sciatica 08/30/2016  . CKD (  chronic kidney disease) stage 3, GFR 30-59 ml/min ([facility]) 08/30/2016  . Palpitations 04/22/2016  . Left carotid bruit 04/22/2016  . Hypothyroidism   . Hypertension   . Heartburn   . Depression   . Dementia ([place])   . Breast cancer ([place])   . Muscle spasm of back 12/19/2014  . Urinary frequency 12/19/2014   Social History   Socioeconomic History  . Marital status: Widowed    Spouse name: Not on file  . Number of children: 5  . Years of education: 82  . Highest education level: Not on file   Occupational History  . Occupation: Retired  Scientific laboratory technician  . Financial resource strain: Not hard at all  . Food insecurity    Worry: Never true    Inability: Never true  . Transportation needs    Medical: No    Non-medical: No  Tobacco Use  . Smoking status: Former Smoker    Packs/day: 0.10    Years: 20.00    Pack years: 2.00    Quit date: 2008    Years since quitting: 12.7  . Smokeless tobacco: Never Used  . Tobacco comment: Quit at  age 84  Substance and Sexual Activity  . Alcohol use: No    Alcohol/week: 0.0 standard drinks  . Drug use: No  . Sexual activity: Never  Lifestyle  . Physical activity    Days per week: 0 days    Minutes per session: 0 min  . Stress: Only a little  Relationships  . Social connections    Talks on phone: More than three times a week    Gets together: More than three times a week    Attends religious service: Never    Active member of club or organization: No    Attends meetings of clubs or organizations: Never    Relationship status: Widowed  . Intimate partner violence    Fear of current or ex partner: No    Emotionally abused: No    Physically abused: No    Forced sexual activity: No  Other Topics Concern  . Not on file  Social History Narrative   Diet?       Do you drink/eat things with caffeine? yes      Marital status?                widow                    What year were you married?      Do you live in a house, apartment, assisted living, condo, trailer, etc.? house      Is it one or more stories? yes      How many persons live in your home? 2      Do you have any pets in your home? (please list) no      Current or past profession:      Do you exercise?         no                             Type & how often? none      Do you have a living will? no   Do you have a DNR form?  no  If not, do you want to discuss one? no      Do you have signed POA/HPOA for forms?     Ms. Leu  family history includes Arthritis in her mother; Breast cancer in her paternal aunt; Cancer in her brother; Heart disease in her father and mother; Hypertension in her father and mother.      Objective:    Vitals:   09/24/18 0930  BP: (!) 142/58  Pulse: 66  Temp: 98.6 F (37 C)    Physical Exam Well-developed well-nourished elderly African-American female in no acute distress very pleasant height, Weight, 194 BMI 31.3  HEENT; nontraumatic normocephalic, EOMI, PE R LA, sclera anicteric. Oropharynx; not examined/wearing mask/COVID  Neck; supple, no JVD Cardiovascular; regular rate and rhythm with S1-S2, no murmur rub or gallop Pulmonary; Clear bilaterally Abdomen; soft, nontender, nondistended, no palpable mass or hepatosplenomegaly, bowel sounds are active Rectal; not done today, recently documented Hemoccult positive Skin; benign exam, no jaundice rash or appreciable lesions Extremities; no clubbing cyanosis or edema skin warm and dry Neuro/Psych; alert and oriented x4, grossly nonfocal mood and affect appropriate       Assessment & Plan:  #84 year old African-American female with normocytic anemia with drift in hemoglobin and Hemoccult positive stool. Patient's only GI symptoms are that of chronic heartburn.  Patient has never had colonoscopy or EGD. We will need to rule out occult lower versus upper GI etiologies.  Rule out occult neoplasm, chronic gastropathy, Cameron erosions, AVMs.  #2 chronic kidney disease stage III-possibly contributing to anemia #3 hypothyroidism 4.  Hypertension 5.  Prior history of breast cancer status post radiation 6.  Mild dementia 7.  Intermittent mild vaginal spotting chronic, onset post radiation for breast cancer  Plan; repeat CBC today, check iron studies B12 and folate Start over-the-counter Pepcid 1 mg p.o. every morning. Patient will be scheduled for colonoscopy and upper endoscopy with Dr. Silverio Decamp Both procedures were discussed  in detail with patient including indications risks and benefits and she is agreeable to proceed.  Regina S Shanicka Oldenkamp PA-C 09/24/2018   Cc: [name], NP

## 2018-09-24 NOTE — Patient Instructions (Signed)
Your provider has requested that you go to the basement level for lab work before leaving today. Press "B" on the elevator. The lab is located at the first door on the left as you exit the elevator.  Start over the counter Pepcid 20 mg daily for heartburn.   It has been recommended to you by your physician that you have a(n) EGD/Colonoscopy completed. Per your request, we did not schedule the procedure(s) today. Please contact our office at [phone] should you decide to have the procedure completed.

## 2018-09-30 ENCOUNTER — Other Ambulatory Visit: Payer: Self-pay | Admitting: Nurse Practitioner

## 2018-09-30 DIAGNOSIS — G3184 Mild cognitive impairment, so stated: Secondary | ICD-10-CM

## 2018-10-05 ENCOUNTER — Telehealth: Payer: Self-pay

## 2018-10-05 NOTE — Telephone Encounter (Signed)
Patient aware and r/s for pre-op and procedure.

## 2018-10-05 NOTE — Telephone Encounter (Signed)
Covid-19 screening questions   Do you now or have you had a fever in the last 14 days? NO   Do you have any respiratory symptoms of shortness of breath or cough now or in the last 14 days? NO  Do you have any family members or close contacts with diagnosed or suspected Covid-19 in the past 14 days? NO  Have you been tested for Covid-19 and found to be positive? NO        

## 2018-10-05 NOTE — Telephone Encounter (Signed)
error 

## 2018-10-05 NOTE — Telephone Encounter (Signed)
Patient is scheduled for a procedure tomorrow with Dr. Silverio Decamp. The daughter states that they were not given instructions or a prep for the procedure. She called back the day after the patient was seen in the office to schedule.

## 2018-10-05 NOTE — Telephone Encounter (Signed)
Regina Porter Per Dr Silverio Decamp patient needs to reschedule with a Preop appointment

## 2018-10-06 ENCOUNTER — Encounter: Payer: Medicare Other | Admitting: Gastroenterology

## 2018-10-06 ENCOUNTER — Other Ambulatory Visit: Payer: Self-pay

## 2018-10-06 ENCOUNTER — Ambulatory Visit (AMBULATORY_SURGERY_CENTER): Payer: Self-pay | Admitting: *Deleted

## 2018-10-06 VITALS — Temp 96.6°F | Ht 65.0 in | Wt 195.0 lb

## 2018-10-06 DIAGNOSIS — R195 Other fecal abnormalities: Secondary | ICD-10-CM

## 2018-10-06 DIAGNOSIS — K219 Gastro-esophageal reflux disease without esophagitis: Secondary | ICD-10-CM

## 2018-10-06 MED ORDER — PEG 3350-KCL-NA BICARB-NACL 420 G PO SOLR: 4000.0000 mL | Freq: Once | ORAL | 0 refills | Status: AC

## 2018-10-06 NOTE — Progress Notes (Addendum)
No egg or soy allergy known to patient  No issues with past sedation with any surgeries  or procedures, no intubation problems  No diet pills per patient No home 02 use per patient  No blood thinners per patient   issues with constipation - pt states has occ constipation and doesn't use any medicines to help her go to the BR  No A fib or A flutter  EMMI video sent to pt's e mail   Daughter Elder Love in pv  today with pt due to Mild Dementia- HER TEMP IS 96.6 Daughter asked to RS eCL- RS to Fri 9-25 3 pm with dr Silverio Decamp- cancelled 9-30 ECL at pt and daughters request - pt states she has only eaten a slice of toast today  - she is aware of diet change starting immediately today   Due to the COVID-19 pandemic we are asking patients to follow these guidelines. Please only bring one care partner. Please be aware that your care partner may wait in the car in the parking lot or if they feel like they will be too hot to wait in the car, they may wait in the lobby on the 4th floor. All care partners are required to wear a mask the entire time (we do not have any that we can provide them), they need to practice social distancing, and we will do a Covid check for all patient's and care partners when you arrive. Also we will check their temperature and your temperature. If the care partner waits in their car they need to stay in the parking lot the entire time and we will call them on their cell phone when the patient is ready for discharge so they can bring the car to the front of the building. Also all patient's will need to wear a mask into building.

## 2018-10-08 ENCOUNTER — Other Ambulatory Visit: Payer: Self-pay

## 2018-10-08 ENCOUNTER — Telehealth: Payer: Self-pay

## 2018-10-08 ENCOUNTER — Ambulatory Visit
Admission: RE | Admit: 2018-10-08 | Discharge: 2018-10-08 | Disposition: A | Discharge status: Home / Self Care | Payer: Medicare Other | Source: Ambulatory Visit | Attending: Nurse Practitioner | Admitting: Nurse Practitioner

## 2018-10-08 ENCOUNTER — Ambulatory Visit: Payer: Medicare Other

## 2018-10-08 DIAGNOSIS — Z1231 Encounter for screening mammogram for malignant neoplasm of breast: Secondary | ICD-10-CM

## 2018-10-08 DIAGNOSIS — G479 Sleep disorder, unspecified: Secondary | ICD-10-CM

## 2018-10-08 NOTE — Telephone Encounter (Signed)
Covid-19 screening questions   Do you now or have you had a fever in the last 14 days? NO   Do you have any respiratory symptoms of shortness of breath or cough now or in the last 14 days? NO   Do you have any family members or close contacts with diagnosed or suspected Covid-19 in the past 14 days? NO   Have you been tested for Covid-19 and found to be positive? NO     Questions answered by daughter Mamie Laurel

## 2018-10-09 ENCOUNTER — Ambulatory Visit (AMBULATORY_SURGERY_CENTER): Payer: Medicare Other | Admitting: Gastroenterology

## 2018-10-09 ENCOUNTER — Other Ambulatory Visit: Payer: Self-pay

## 2018-10-09 ENCOUNTER — Encounter: Payer: Self-pay | Admitting: Gastroenterology

## 2018-10-09 VITALS — BP 116/65 | HR 76 | Temp 98.3°F | Resp 13 | Ht 65.0 in | Wt 195.0 lb

## 2018-10-09 DIAGNOSIS — K219 Gastro-esophageal reflux disease without esophagitis: Secondary | ICD-10-CM

## 2018-10-09 DIAGNOSIS — K648 Other hemorrhoids: Secondary | ICD-10-CM | POA: Diagnosis not present

## 2018-10-09 DIAGNOSIS — K449 Diaphragmatic hernia without obstruction or gangrene: Secondary | ICD-10-CM | POA: Diagnosis not present

## 2018-10-09 DIAGNOSIS — K2970 Gastritis, unspecified, without bleeding: Secondary | ICD-10-CM

## 2018-10-09 DIAGNOSIS — R195 Other fecal abnormalities: Secondary | ICD-10-CM | POA: Diagnosis not present

## 2018-10-09 DIAGNOSIS — D122 Benign neoplasm of ascending colon: Secondary | ICD-10-CM

## 2018-10-09 DIAGNOSIS — Z1211 Encounter for screening for malignant neoplasm of colon: Secondary | ICD-10-CM | POA: Diagnosis not present

## 2018-10-09 DIAGNOSIS — D123 Benign neoplasm of transverse colon: Secondary | ICD-10-CM

## 2018-10-09 DIAGNOSIS — K5730 Diverticulosis of large intestine without perforation or abscess without bleeding: Secondary | ICD-10-CM

## 2018-10-09 MED ORDER — SODIUM CHLORIDE 0.9 % IV SOLN: 500.0000 mL | Freq: Once | INTRAVENOUS | Status: DC

## 2018-10-09 NOTE — Op Note (Signed)
[place] [facility] Patient Name: Janissa Bertram Procedure Date: 10/09/2018 2:42 PM MRN: [id number] Endoscopist: Mauri Pole , MD Age: 84 Referring MD:  Date of Birth: 1934/02/13 Gender: Female Account #: [id number] Procedure:                Upper GI endoscopy Indications:              Gastrointestinal bleeding of unknown origin Medicines:                Monitored Anesthesia Care Procedure:                Pre-Anesthesia Assessment:                           - Prior to the procedure, a History and Physical                            was performed, and patient medications and                            allergies were reviewed. The patient's tolerance of                            previous anesthesia was also reviewed. The risks                            and benefits of the procedure and the sedation                            options and risks were discussed with the patient.                            All questions were answered, and informed consent                            was obtained. Prior Anticoagulants: The patient has                            taken no previous anticoagulant or antiplatelet                            agents. ASA Grade Assessment: III - A patient with                            severe systemic disease. After reviewing the risks                            and benefits, the patient was deemed in                            satisfactory condition to undergo the procedure.                           After obtaining informed consent, the endoscope was  passed under direct vision. Throughout the                            procedure, the patient's blood pressure, pulse, and                            oxygen saturations were monitored continuously. The                            Endoscope was introduced through the mouth, and                            advanced to the second part of duodenum. The upper                            GI  endoscopy was accomplished without difficulty.                            The patient tolerated the procedure well. Scope In: Scope Out: Findings:                 The esophagus was normal.                           A small hiatal hernia was present.                           Patchy mild inflammation characterized by                            congestion (edema), erosions and erythema was found                            in the gastric antrum and in the prepyloric region                            of the stomach. Biopsies were taken with a cold                            forceps for Helicobacter pylori testing.                           The examined duodenum was normal. Complications:            No immediate complications. Estimated Blood Loss:     Estimated blood loss was minimal. Impression:               - Normal esophagus.                           - Small hiatal hernia.                           - Gastritis. Biopsied.                           - Normal examined  duodenum. Recommendation:           - Patient has a contact number available for                            emergencies. The signs and symptoms of potential                            delayed complications were discussed with the                            patient. Return to normal activities tomorrow.                            Written discharge instructions were provided to the                            patient.                           - Resume previous diet.                           - Continue present medications.                           - Await pathology results. Mauri Pole, MD 10/09/2018 3:15:16 PM This report has been signed electronically.

## 2018-10-09 NOTE — Progress Notes (Signed)
Called to room to assist during endoscopic procedure.  Patient ID and intended procedure confirmed with present staff. Received instructions for my participation in the procedure from the performing physician.  

## 2018-10-09 NOTE — Patient Instructions (Signed)
Impression/Recommendations:  Gastritis handout given to patient. Hiatal hernia handout given to patient. Polyp handout given to patient. Diverticulosis handout given to patient. Hemorrhoid handout given to patient.  Resume previous diet. Continue present medications. Await pathology results.  No repeat colonoscopy due to age.  Return to GI clinic as needed.  YOU HAD AN ENDOSCOPIC PROCEDURE TODAY AT [facility]:   Refer to the procedure report that was given to you for any specific questions about what was found during the examination.  If the procedure report does not answer your questions, please call your gastroenterologist to clarify.  If you requested that your care partner not be given the details of your procedure findings, then the procedure report has been included in a sealed envelope for you to review at your convenience later.  YOU SHOULD EXPECT: Some feelings of bloating in the abdomen. Passage of more gas than usual.  Walking can help get rid of the air that was put into your GI tract during the procedure and reduce the bloating. If you had a lower endoscopy (such as a colonoscopy or flexible sigmoidoscopy) you may notice spotting of blood in your stool or on the toilet paper. If you underwent a bowel prep for your procedure, you may not have a normal bowel movement for a few days.  Please Note:  You might notice some irritation and congestion in your nose or some drainage.  This is from the oxygen used during your procedure.  There is no need for concern and it should clear up in a day or so.  SYMPTOMS TO REPORT IMMEDIATELY:   Following lower endoscopy (colonoscopy or flexible sigmoidoscopy):  Excessive amounts of blood in the stool  Significant tenderness or worsening of abdominal pains  Swelling of the abdomen that is new, acute  Fever of 100F or higher   Following upper endoscopy (EGD)  Vomiting of blood or coffee ground material  New chest pain or  pain under the shoulder blades  Painful or persistently difficult swallowing  New shortness of breath  Fever of 100F or higher  Black, tarry-looking stools  For urgent or emergent issues, a gastroenterologist can be reached at any hour by calling [phone].   DIET:  We do recommend a small meal at first, but then you may proceed to your regular diet.  Drink plenty of fluids but you should avoid alcoholic beverages for 24 hours.  ACTIVITY:  You should plan to take it easy for the rest of today and you should NOT DRIVE or use heavy machinery until tomorrow (because of the sedation medicines used during the test).    FOLLOW UP: Our staff will call the number listed on your records 48-72 hours following your procedure to check on you and address any questions or concerns that you may have regarding the information given to you following your procedure. If we do not reach you, we will leave a message.  We will attempt to reach you two times.  During this call, we will ask if you have developed any symptoms of COVID 19. If you develop any symptoms (ie: fever, flu-like symptoms, shortness of breath, cough etc.) before then, please call [phone].  If you test positive for Covid 19 in the 2 weeks post procedure, please call and report this information to [place].    If any biopsies were taken you will be contacted by phone or by letter within the next 1-3 weeks.  Please call us at [phone] if  you have not heard about the biopsies in 3 weeks.    SIGNATURES/CONFIDENTIALITY: You and/or your care partner have signed paperwork which will be entered into your electronic medical record.  These signatures attest to the fact that that the information above on your After Visit Summary has been reviewed and is understood.  Full responsibility of the confidentiality of this discharge information lies with you and/or your care-partner.

## 2018-10-09 NOTE — Progress Notes (Signed)
A and O x3. Report to RN. Tolerated MAC anesthesia well.Gums unchanged after procedure.

## 2018-10-09 NOTE — Progress Notes (Signed)
Temperature- Regina Porter  Pt's states no medical or surgical changes since previsit or office visit.

## 2018-10-09 NOTE — Op Note (Signed)
[place] [facility] Patient Name: Regina Porter Procedure Date: 10/09/2018 2:41 PM MRN: [id number] Endoscopist: Mauri Pole , MD Age: 84 Referring MD:  Date of Birth: Jan 31, 1934 Gender: Female Account #: [id number] Procedure:                Colonoscopy Indications:              Evaluation of unexplained GI bleeding presenting                            with fecal occult blood Medicines:                Monitored Anesthesia Care Procedure:                Pre-Anesthesia Assessment:                           - Prior to the procedure, a History and Physical                            was performed, and patient medications and                            allergies were reviewed. The patient's tolerance of                            previous anesthesia was also reviewed. The risks                            and benefits of the procedure and the sedation                            options and risks were discussed with the patient.                            All questions were answered, and informed consent                            was obtained. Prior Anticoagulants: The patient has                            taken no previous anticoagulant or antiplatelet                            agents. ASA Grade Assessment: III - A patient with                            severe systemic disease. After reviewing the risks                            and benefits, the patient was deemed in                            satisfactory condition to undergo the procedure.  After obtaining informed consent, the colonoscope                            was passed under direct vision. Throughout the                            procedure, the patient's blood pressure, pulse, and                            oxygen saturations were monitored continuously. The                            Colonoscope was introduced through the anus and                            advanced to the the cecum,  identified by                            appendiceal orifice and ileocecal valve. The                            colonoscopy was performed without difficulty. The                            patient tolerated the procedure well. The quality                            of the bowel preparation was good. The ileocecal                            valve, appendiceal orifice, and rectum were                            photographed. Scope In: 2:52:56 PM Scope Out: 3:10:24 PM Scope Withdrawal Time: 0 hours 11 minutes 58 seconds  Total Procedure Duration: 0 hours 17 minutes 28 seconds  Findings:                 The perianal and digital rectal examinations were                            normal.                           Three sessile polyps were found in the transverse                            colon and ascending colon. The polyps were 4 to 12                            mm in size. These polyps were removed with a cold                            snare. Resection and retrieval were complete.  Scattered small-mouthed diverticula were found in                            the sigmoid colon, descending colon and ascending                            colon.                           Non-bleeding internal hemorrhoids were found during                            retroflexion. The hemorrhoids were small.                           The exam was otherwise without abnormality. Complications:            No immediate complications. Estimated Blood Loss:     Estimated blood loss was minimal. Impression:               - Three 4 to 12 mm polyps in the transverse colon                            and in the ascending colon, removed with a cold                            snare. Resected and retrieved.                           - Diverticulosis in the sigmoid colon, in the                            descending colon and in the ascending colon.                           - Non-bleeding internal  hemorrhoids.                           - The examination was otherwise normal. Recommendation:           - Patient has a contact number available for                            emergencies. The signs and symptoms of potential                            delayed complications were discussed with the                            patient. Return to normal activities tomorrow.                            Written discharge instructions were provided to the                            patient.                           -  Resume previous diet.                           - Continue present medications.                           - Await pathology results.                           - No repeat colonoscopy due to age.                           - Return to GI clinic PRN. Mauri Pole, MD 10/09/2018 3:17:50 PM This report has been signed electronically.

## 2018-10-11 DIAGNOSIS — G4733 Obstructive sleep apnea (adult) (pediatric): Secondary | ICD-10-CM | POA: Diagnosis not present

## 2018-10-12 LAB — HELICOBACTER PYLORI SCREEN-BIOPSY: UREASE: NEGATIVE

## 2018-10-13 ENCOUNTER — Telehealth: Payer: Self-pay

## 2018-10-13 NOTE — Telephone Encounter (Signed)
  Follow up Call-  Call back number 10/09/2018  Post procedure Call Back phone  # [phone]  Permission to leave phone message Yes  Some recent data might be hidden     Patient questions:  Do you have a fever, pain , or abdominal swelling? No. Pain Score  0 *  Have you tolerated food without any problems? Yes.    Have you been able to return to your normal activities? Yes.    Do you have any questions about your discharge instructions: Diet   No. Medications  No. Follow up visit  No.  Do you have questions or concerns about your Care? No.  Actions: * If pain score is 4 or above: No action needed, pain <4.  1. Have you developed a fever since your procedure? no  2.   Have you had an respiratory symptoms (SOB or cough) since your procedure? no  3.   Have you tested positive for COVID 19 since your procedure no  4.   Have you had any family members/close contacts diagnosed with the COVID 19 since your procedure?  no   If yes to any of these questions please route to Joylene John, RN and Alphonsa Gin, Therapist, sports.

## 2018-10-14 ENCOUNTER — Encounter: Payer: Medicare Other | Admitting: Gastroenterology

## 2018-10-14 ENCOUNTER — Ambulatory Visit: Payer: Medicare Other

## 2018-10-15 ENCOUNTER — Telehealth: Payer: Self-pay | Admitting: Pulmonary Disease

## 2018-10-15 DIAGNOSIS — G4733 Obstructive sleep apnea (adult) (pediatric): Secondary | ICD-10-CM

## 2018-10-15 NOTE — Telephone Encounter (Signed)
Called, spoke with Regina Porter, and Daughter. Sleep study results and recommendations given.  Understanding stated.  DME cpap order placed.  Regina Porter scheduled 01/11/19. Nothing further at this time.   Dr. Ander Slade has reviewed the home sleep test this showed mild sleep apnea.   Recommendations   Treatment options are CPAP with the settings auto 5 to 15 will be appropriate.   Management of insomnia with a short acting hypnotic may be considered.  Ambien, zaleplon may be tried.  Close clinical follow up with compliance monitoring to optimize therapeutic efficiency    Weight loss measures .   Advise against driving while sleepy & against medication with sedative side effects.    Make appointment for 3 months for compliance with download with Dr. Ander Slade.

## 2018-10-16 ENCOUNTER — Encounter: Payer: Self-pay | Admitting: Gastroenterology

## 2018-10-31 ENCOUNTER — Other Ambulatory Visit: Payer: Self-pay | Admitting: Nurse Practitioner

## 2018-10-31 DIAGNOSIS — F419 Anxiety disorder, unspecified: Secondary | ICD-10-CM

## 2018-11-02 ENCOUNTER — Other Ambulatory Visit: Payer: Self-pay | Admitting: *Deleted

## 2018-11-02 DIAGNOSIS — G3184 Mild cognitive impairment, so stated: Secondary | ICD-10-CM

## 2018-11-02 MED ORDER — LOSARTAN POTASSIUM 100 MG PO TABS: ORAL_TABLET | ORAL | 0 refills | Status: DC

## 2018-11-02 MED ORDER — HYDRALAZINE HCL 100 MG PO TABS: 100.0000 mg | ORAL_TABLET | Freq: Three times a day (TID) | ORAL | 0 refills | Status: DC

## 2018-11-02 MED ORDER — DONEPEZIL HCL 5 MG PO TABS: ORAL_TABLET | ORAL | 0 refills | Status: DC

## 2018-11-02 MED ORDER — LEVOTHYROXINE SODIUM 75 MCG PO TABS: ORAL_TABLET | ORAL | 0 refills | Status: DC

## 2018-11-02 MED ORDER — AMLODIPINE BESYLATE 10 MG PO TABS: 10.0000 mg | ORAL_TABLET | Freq: Every day | ORAL | 0 refills | Status: DC

## 2018-11-02 NOTE — Telephone Encounter (Signed)
I called patient and spoke with [name]. [name] aware patient is due for an appointment per instructions at her last routine follow up in July 2020. [name] did not wish to schedule follow-up at this time, states she will call back

## 2018-11-02 NOTE — Progress Notes (Signed)
Reviewed and agree with documentation and assessment and plan. K. Veena Nandigam , MD   

## 2018-11-02 NOTE — Telephone Encounter (Signed)
Patient requested refill.  Patient has an upcoming appointment with Janett Billow. Confirmed.  Pended Rx's and sent to [facility] for approval due to [place].

## 2018-11-03 ENCOUNTER — Ambulatory Visit: Payer: Medicare Other | Admitting: Pulmonary Disease

## 2018-11-11 ENCOUNTER — Ambulatory Visit: Payer: Self-pay | Admitting: Nurse Practitioner

## 2018-11-24 ENCOUNTER — Other Ambulatory Visit: Payer: Self-pay | Admitting: Nurse Practitioner

## 2019-01-11 ENCOUNTER — Ambulatory Visit: Payer: Medicare Other | Admitting: Pulmonary Disease

## 2019-01-12 ENCOUNTER — Telehealth: Payer: Self-pay | Admitting: *Deleted

## 2019-01-12 NOTE — Telephone Encounter (Signed)
Patient daughter called and stated that patient is requesting a referral to GYN due to increased spotting.  Daughter though a referral had already been placed for this. I reviewed the referrals and in August a referral was placed to [facility]. They had called patient to schedule an appointment but patient told them that she would call back to schedule and never did.  Daughter stated that she will call their office and see if she can go ahead and schedule the appointment for patient. Agreed.  Daughter will call back if they do not schedule.

## 2019-01-15 ENCOUNTER — Other Ambulatory Visit: Payer: Self-pay | Admitting: Nurse Practitioner

## 2019-01-15 DIAGNOSIS — I10 Essential (primary) hypertension: Secondary | ICD-10-CM

## 2019-01-27 IMAGING — CT CT HEAD W/O CM
3 of 4 series · 13 of 47 positions shown, 15 images · non-contrast
Comparison: None.

CLINICAL DATA: Pt presents to ED for assessment dizziness since
this morning at 2am, intermittent in nature. No other neuro deficits
noted. Denies headache, denies weakness, denies numbness. Hx of
vertigo, states it feels similar.

EXAM:
CT HEAD WITHOUT CONTRAST
TECHNIQUE: Contiguous axial images were obtained from the base of the skull
through the vertex without intravenous contrast.

[Series 2: head without · axial · non-contrast · 0.41mm/px · z∈[-57,+48]mm · 7 of 29 slices shown, 9 images]
[im 4/29  brain]
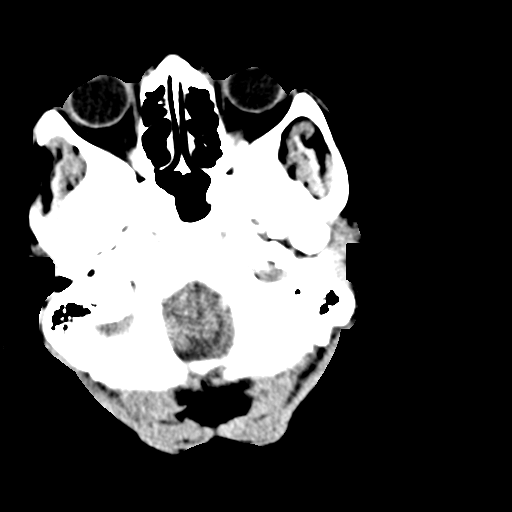
[im 4/29  bone]
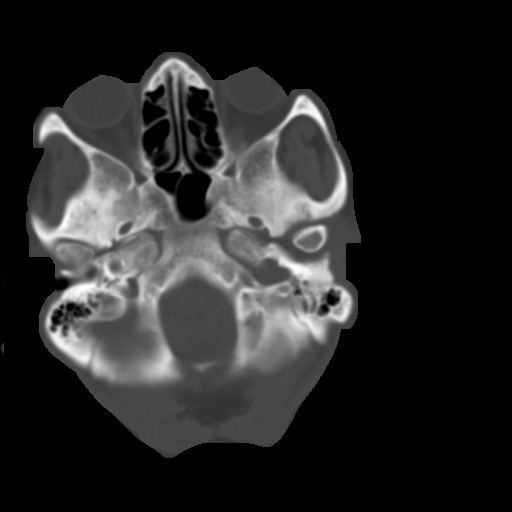
[im 8/29  brain]
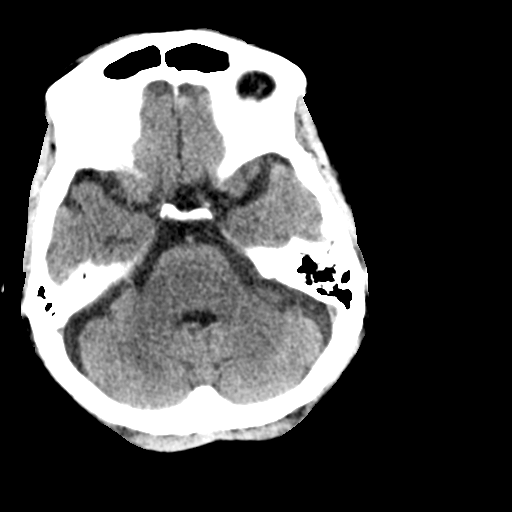
[im 11/29  brain]
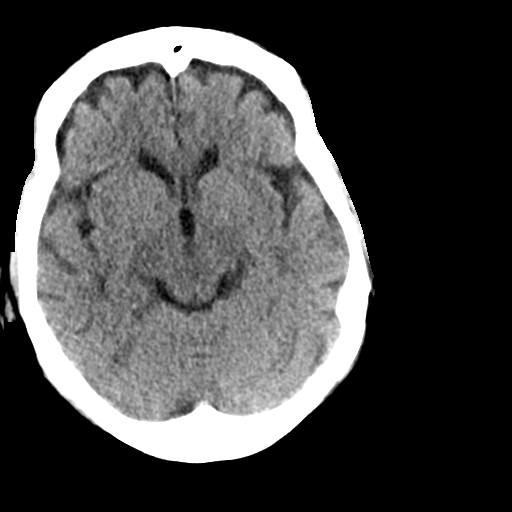
[im 15/29  brain]
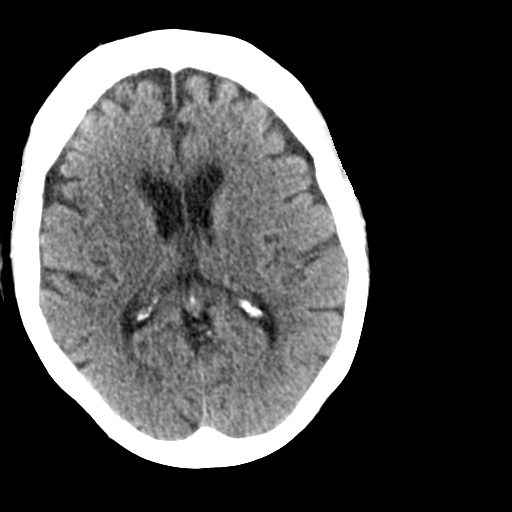
[im 18/29  brain]
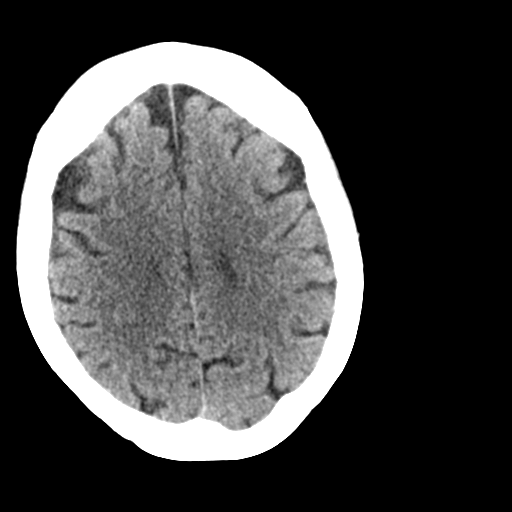
[im 18/29  bone]
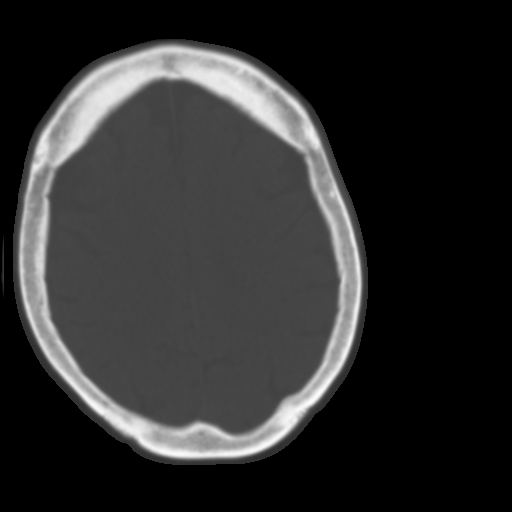
[im 22/29  brain]
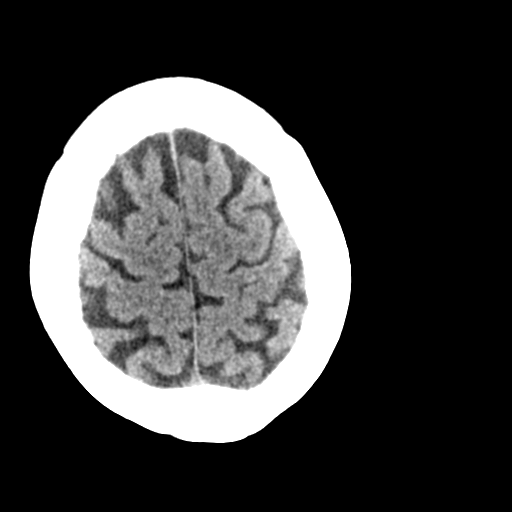
[im 25/29  brain]
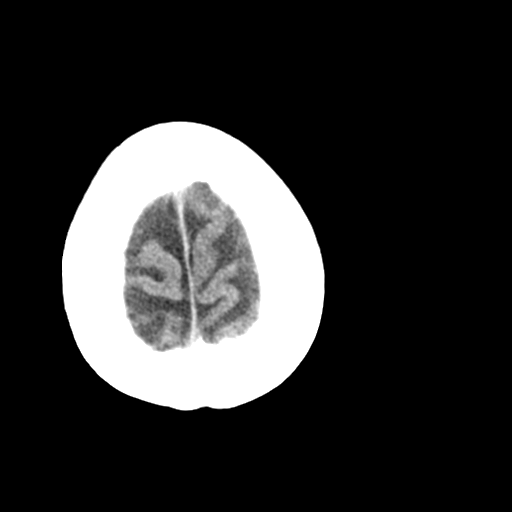

[Series 4: head without cor · coronal · non-contrast · 0.28mm/px · 3 of 67 slices shown]
[im 23/67  brain]
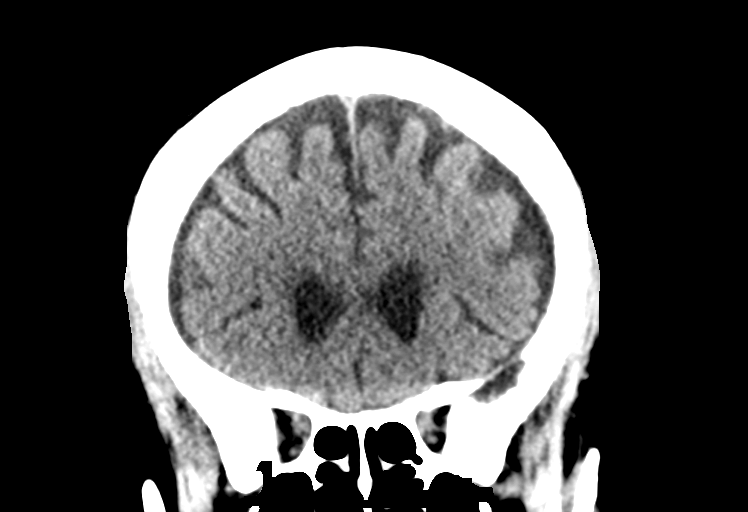
[im 30/67  brain]
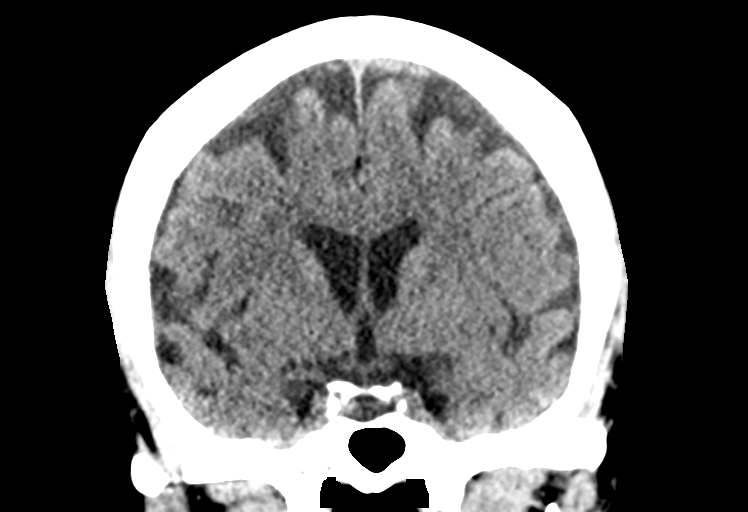
[im 37/67  brain]
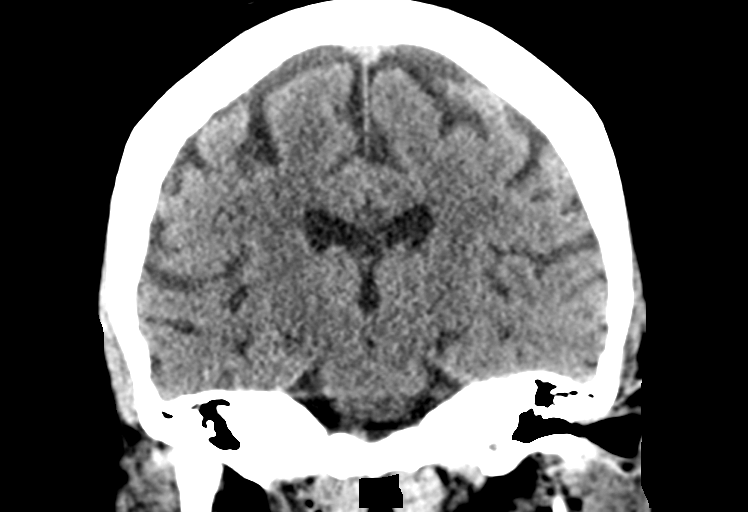

[Series 5: head without sag · sagittal · non-contrast · 0.28mm/px · 3 of 67 slices shown]
[im 23/67  brain]
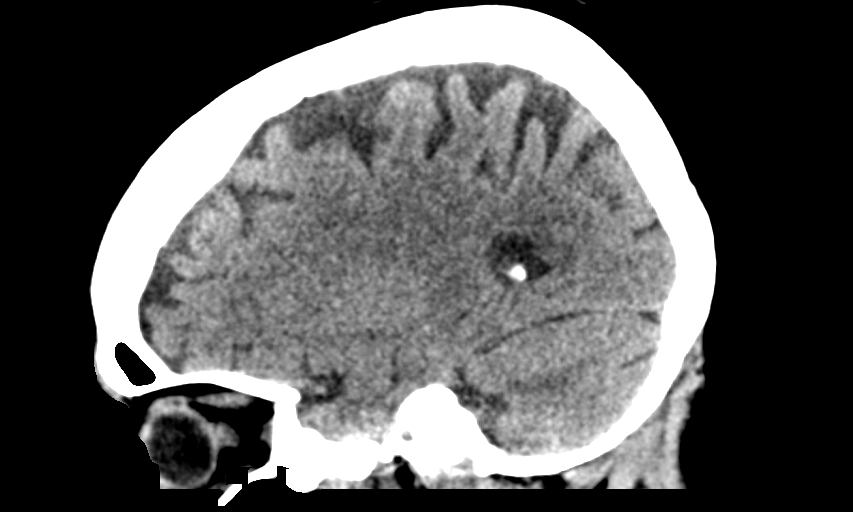
[im 34/67  brain]
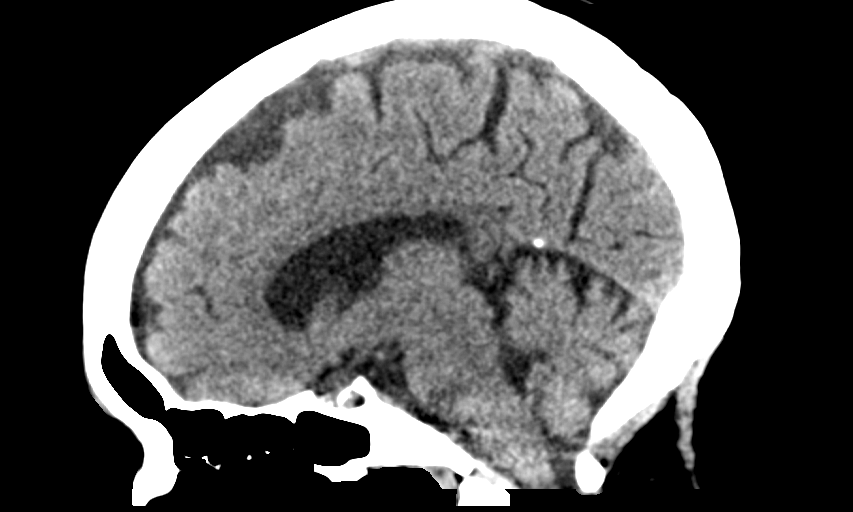
[im 45/67  brain]
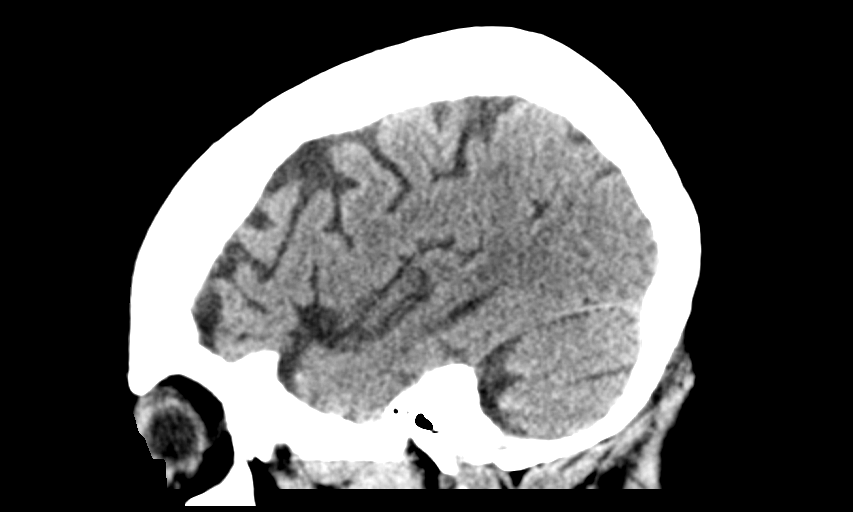

[13 of 47 positions shown; findings below may reference images not displayed]

FINDINGS: Brain: There is mild central and cortical atrophy. There is no intra
or extra-axial fluid collection or mass lesion. The basilar cisterns
and ventricles have a normal appearance. There is no CT evidence for
acute infarction or hemorrhage.

Vascular: There is atherosclerotic calcification of the carotid
siphons.

Skull: Normal. Negative for fracture or focal lesion.

Sinuses/Orbits: No acute finding.

Other: None
IMPRESSION: 1. Mild atrophy.
2.  No evidence for acute intracranial abnormality.

## 2019-01-28 ENCOUNTER — Other Ambulatory Visit: Payer: Self-pay | Admitting: Nurse Practitioner

## 2019-01-28 DIAGNOSIS — F419 Anxiety disorder, unspecified: Secondary | ICD-10-CM

## 2019-01-28 NOTE — Telephone Encounter (Signed)
rx sent to pharmacy by e-script  

## 2019-02-02 ENCOUNTER — Other Ambulatory Visit: Payer: Self-pay

## 2019-02-03 ENCOUNTER — Encounter: Payer: Self-pay | Admitting: Obstetrics & Gynecology

## 2019-02-03 ENCOUNTER — Ambulatory Visit (INDEPENDENT_AMBULATORY_CARE_PROVIDER_SITE_OTHER): Payer: Medicare Other | Admitting: Obstetrics & Gynecology

## 2019-02-03 ENCOUNTER — Other Ambulatory Visit: Payer: Self-pay | Admitting: Nurse Practitioner

## 2019-02-03 VITALS — BP 146/70 | Ht 65.0 in | Wt 198.0 lb

## 2019-02-03 DIAGNOSIS — Z124 Encounter for screening for malignant neoplasm of cervix: Secondary | ICD-10-CM | POA: Diagnosis not present

## 2019-02-03 DIAGNOSIS — N950 Postmenopausal bleeding: Secondary | ICD-10-CM | POA: Diagnosis not present

## 2019-02-03 DIAGNOSIS — Z853 Personal history of malignant neoplasm of breast: Secondary | ICD-10-CM | POA: Diagnosis not present

## 2019-02-03 DIAGNOSIS — Z9289 Personal history of other medical treatment: Secondary | ICD-10-CM | POA: Diagnosis not present

## 2019-02-03 NOTE — Progress Notes (Signed)
    Regina Porter October 12, 1934 [id number]   History:    85 y.o. G5P5L5  RP:  New patient presenting for PMB  HPI: PMB x about 5 yrs while on Tamoxifen for Breast Cancer.  Tamoxifen finished 09/2018.  Still having PMB everyday, at times heavier.  No abdominopelvic pain. No vaginal discharge otherwise. Not sexually active.  Past medical history,surgical history, family history and social history were all reviewed and documented in the EPIC chart.  Gynecologic History No LMP recorded. Patient is postmenopausal.  Obstetric History OB History  Gravida Para Term Preterm AB Living  5 5       5   SAB TAB Ectopic Multiple Live Births               # Outcome Date GA Lbr Len/2nd Weight Sex Delivery Anes PTL Lv  5 Para           4 Para           3 Para           2 Para           1 Para              ROS: A ROS was performed and pertinent positives and negatives are included in the history.  GENERAL: No fevers or chills. HEENT: No change in vision, no earache, sore throat or sinus congestion. NECK: No pain or stiffness. CARDIOVASCULAR: No chest pain or pressure. No palpitations. PULMONARY: No shortness of breath, cough or wheeze. GASTROINTESTINAL: No abdominal pain, nausea, vomiting or diarrhea, melena or bright red blood per rectum. GENITOURINARY: No urinary frequency, urgency, hesitancy or dysuria. MUSCULOSKELETAL: No joint or muscle pain, no back pain, no recent trauma. DERMATOLOGIC: No rash, no itching, no lesions. ENDOCRINE: No polyuria, polydipsia, no heat or cold intolerance. No recent change in weight. HEMATOLOGICAL: No anemia or easy bruising or bleeding. NEUROLOGIC: No headache, seizures, numbness, tingling or weakness. PSYCHIATRIC: No depression, no loss of interest in normal activity or change in sleep pattern.     Exam:   BP (!) 146/70   Ht 5\' 5"  (1.651 m)   Wt 198 lb (89.8 kg)   BMI 32.95 kg/m   Body mass index is 32.95 kg/m.  General appearance : Well developed well nourished  female. No acute distress  Pelvic: Vulva: Normal             Vagina: No gross lesions or discharge  Cervix: No gross lesions or discharge.  Pap reflex done.  Mild dark blood from EO of cervix.  Uterus  AV, normal size, shape and consistency, non-tender and mobile  Adnexa  Without masses or tenderness  Anus: Normal   Assessment/Plan:  85 y.o. female for annual exam   1. Postmenopausal bleeding Postmenopausal bleeding while on tamoxifen treatment for breast cancer. Tamoxifen finished September 2020, patient still having postmenopausal bleeding now. Gynecologic exam normal today except for mild dark blood from the external os of the cervix. Counseling on postmenopausal bleeding done. Possible causes of postmenopausal bleeding reviewed with the patient including endometrial polyp, submucosal fibroid, endometrial hyperplasia and endometrial cancer. Investigation and management reviewed with patient. We will follow-up for a pelvic ultrasound, possible endometrial biopsy. - US Transvaginal Non-OB; Future  2. History of left breast cancer Finished tamoxifen in September 2020.  3. Screening for malignant neoplasm of cervix - Pap IG w/ reflex to HPV when ASC-US  [name] MD, 2:10 PM 02/03/2019

## 2019-02-04 LAB — PAP IG W/ RFLX HPV ASCU

## 2019-02-05 ENCOUNTER — Encounter: Payer: Self-pay | Admitting: Obstetrics & Gynecology

## 2019-02-05 NOTE — Patient Instructions (Signed)
1. Postmenopausal bleeding Postmenopausal bleeding while on tamoxifen treatment for breast cancer. Tamoxifen finished September 2020, patient still having postmenopausal bleeding now. Gynecologic exam normal today except for mild dark blood from the external os of the cervix. Counseling on postmenopausal bleeding done. Possible causes of postmenopausal bleeding reviewed with the patient including endometrial polyp, submucosal fibroid, endometrial hyperplasia and endometrial cancer. Investigation and management reviewed with patient. We will follow-up for a pelvic ultrasound, possible endometrial biopsy. - US Transvaginal Non-OB; Future  2. History of left breast cancer Finished tamoxifen in September 2020.  3. Screening for malignant neoplasm of cervix - Pap IG w/ reflex to HPV when ASC-US  Regina Porter, it was a pleasure meeting you today! I will inform you of your results as soon as they are available.

## 2019-02-10 ENCOUNTER — Other Ambulatory Visit: Payer: Medicare Other

## 2019-02-10 ENCOUNTER — Ambulatory Visit: Payer: Medicare Other | Admitting: Obstetrics & Gynecology

## 2019-02-16 ENCOUNTER — Ambulatory Visit (INDEPENDENT_AMBULATORY_CARE_PROVIDER_SITE_OTHER): Payer: Medicare Other | Admitting: Obstetrics & Gynecology

## 2019-02-16 ENCOUNTER — Encounter: Payer: Self-pay | Admitting: Obstetrics & Gynecology

## 2019-02-16 ENCOUNTER — Ambulatory Visit (INDEPENDENT_AMBULATORY_CARE_PROVIDER_SITE_OTHER): Payer: Medicare Other

## 2019-02-16 ENCOUNTER — Other Ambulatory Visit: Payer: Self-pay

## 2019-02-16 VITALS — BP 142/80

## 2019-02-16 DIAGNOSIS — N840 Polyp of corpus uteri: Secondary | ICD-10-CM

## 2019-02-16 DIAGNOSIS — N950 Postmenopausal bleeding: Secondary | ICD-10-CM

## 2019-02-16 DIAGNOSIS — R9389 Abnormal findings on diagnostic imaging of other specified body structures: Secondary | ICD-10-CM | POA: Diagnosis not present

## 2019-02-16 NOTE — Progress Notes (Signed)
    Regina Porter July 07, 1934 [id number]        85 y.o.  G5P5L5  RP: PMB for Pelvic US  HPI: History of breast cancer with postmenopausal bleeding while on tamoxifen and persisting since she stopped a year ago.  No pelvic pain.   OB History  Gravida Para Term Preterm AB Living  5 5       5   SAB TAB Ectopic Multiple Live Births               # Outcome Date GA Lbr Len/2nd Weight Sex Delivery Anes PTL Lv  5 Para           4 Para           3 Para           2 Para           1 Para             Past medical history,surgical history, problem list, medications, allergies, family history and social history were all reviewed and documented in the EPIC chart.   Directed ROS with pertinent positives and negatives documented in the history of present illness/assessment and plan.  Exam:  Vitals:   02/16/19 1410  BP: (!) 142/80   General appearance:  Normal  Pelvic US today: T/V images.  Anteverted uterus with inhomogenous myometrium with at least 2 focal intramural fibroids, measured at 3.15 and 2.03 cm.  The uterus is measured at 9.58 x 7.07 x 5.8 cm.  Thickened endometrial lining with irregular margins measuring 17.17 mm.  Feeder vessels noted leading to areas of thickening.  Bilateral ovaries small with atrophic appearance.  No adnexal mass.  No free fluid in the posterior cul-de-sac.   Assessment/Plan:  85 y.o. G5P5   1. Postmenopausal bleeding Postmenopausal bleeding while patient was taking tamoxifen and persisting after stopping a year ago.  Pelvic ultrasound findings reviewed with patient.  Uterus mildly enlarged with fibroids, the endometrial lining is thickened at 17.17 mm and irregular with feeder vessels compatible with endometrial polyps.  Normal ovaries.  2. Endometrial polyps Probable endometrial polyps.  Decision to schedule HSC/Myosure Excision/D+C.  Info and pamphlet given.  F/U Preop by Televisit.  Plan also discussed with patient's daughter.  Other orders - aspirin  81 MG EC tablet; Take by mouth.  [name] MD, 2:27 PM 02/16/2019

## 2019-02-16 NOTE — Patient Instructions (Signed)
1. Postmenopausal bleeding Postmenopausal bleeding while patient was taking tamoxifen and persisting after stopping a year ago.  Pelvic ultrasound findings reviewed with patient.  Uterus mildly enlarged with fibroids, the endometrial lining is thickened at 17.17 mm and irregular with feeder vessels compatible with endometrial polyps.  Normal ovaries.  2. Endometrial polyps Probable endometrial polyps.  Decision to schedule HSC/Myosure Excision/D+C.  Info and pamphlet given.  F/U Preop by Televisit.  Other orders - aspirin 81 MG EC tablet; Take by mouth.  Regina Porter, it was a pleasure seeing you today!

## 2019-02-17 ENCOUNTER — Telehealth: Payer: Self-pay

## 2019-02-17 NOTE — Telephone Encounter (Signed)
I called to speak with patient about surgery arrangements. I reached her daughter Mamie Laurel, at preferred phone number. She does have DPR access per patient.  We discussed that I have spoken with [facility] and anesthesia would like for her mom to get medical clearance for surgery. I asked who she would consider primary MD and she said Sherrie Mustache, NP at [facility].  I called there and was advised to go ahead and fax clearance form and Janett Billow would review chart and let me know if patient needs a visit.  I told daughter I will call her back to schedule when I receive medical clearance.

## 2019-02-22 ENCOUNTER — Other Ambulatory Visit: Payer: Self-pay | Admitting: Nurse Practitioner

## 2019-02-24 NOTE — Telephone Encounter (Signed)
I called and left message with [facility] to follow up on medical clearance request. Form was faxed a week ago.

## 2019-03-03 ENCOUNTER — Encounter: Payer: Self-pay | Admitting: Family

## 2019-03-03 ENCOUNTER — Ambulatory Visit (INDEPENDENT_AMBULATORY_CARE_PROVIDER_SITE_OTHER): Payer: Medicare Other | Admitting: Family

## 2019-03-03 ENCOUNTER — Telehealth: Payer: Self-pay

## 2019-03-03 ENCOUNTER — Other Ambulatory Visit: Payer: Self-pay

## 2019-03-03 VITALS — BP 140/55 | HR 70 | Temp 96.9°F | Resp 20 | Ht 66.0 in | Wt 197.4 lb

## 2019-03-03 DIAGNOSIS — I10 Essential (primary) hypertension: Secondary | ICD-10-CM

## 2019-03-03 DIAGNOSIS — E039 Hypothyroidism, unspecified: Secondary | ICD-10-CM

## 2019-03-03 DIAGNOSIS — Z01818 Encounter for other preprocedural examination: Secondary | ICD-10-CM

## 2019-03-03 DIAGNOSIS — G4700 Insomnia, unspecified: Secondary | ICD-10-CM | POA: Diagnosis not present

## 2019-03-03 DIAGNOSIS — F411 Generalized anxiety disorder: Secondary | ICD-10-CM | POA: Diagnosis not present

## 2019-03-03 NOTE — Progress Notes (Signed)
Provider: Ellianna Ruest FNP-C   [name], NP  Patient Care Team: [name], NP as PCP - General (Geriatric Medicine) Jettie Booze, MD as PCP - Cardiology (Cardiology) Dustin Folks, MD as Consulting Physician (Hematology and Oncology) Esperanza Heir, MD as Consulting Physician (Obstetrics and Gynecology) Consuella Lose, MD as Consulting Physician (Neurosurgery) Deterding, Jeneen Rinks, MD as Consulting Physician (Nephrology)  Extended Emergency Contact Information Primary Emergency Contact: Gevena Barre [place] Phone: [phone] Relation: Daughter  Code Status: Full Code  Goals of care: Advanced Directive information Advanced Directives 03/03/2019  Does Patient Have a Medical Advance Directive? No  Would patient like information on creating a medical advance directive? -     Chief Complaint  Patient presents with  . Medical Management of Chronic Issues    Routine Follow Up    HPI:  Pt is a 85 y.o. female seen today for 6 month follow up for medical management of chronic diseases.Also needs medical clearance for Hysterectomy send to [facility].she is here with her daughter.I'm seeing her for the first time today usually follows up here at [facility] with PCP Dani Gobble.she has a medical history of Hypertension,Hypothyroidism,Generalized anxiety,Chronic Kidney disease stage 4,Dementia without behavioral disturbance among other conditions. Daughter states patient has had vaginal lesion scheduled for biopsy and possible hysterectomy.Need medical clearance prior to procedure.she denies any abdominal pain.   Hypertension - no home B/p log for review.on losartan 100 mg tablet daily,Metoprolol succinate 100 mg 24 Hr tablet at bedtime,Amlodipine 10 mg tablet daily,Bumex 2 mg tablet daily and Hydralazine 100 mg tablet three times daily.On ASA 81 mg EC tablet daily.she denies any  headache,dizziness,faintness,chest pain or shortness of breath.  Hypothyroidism - on Levothyroxine 75 mcg tablet daily.she denies any heat or cold intolerance.though states cold weather making stay indoors.  Insomnia - states wakes up frequently at night sleeps about three hours then wakes up.On Trazodone 50 mg tablet at bedtime.  Hyperlipidemia - on cod liver oil 1000 mg capsule twice daily.States includes veggies in her diet.   Generalized Anxiety - symptoms stable on Buspirone 10 mg tablet daily      Past Medical History:  Diagnosis Date  . Allergy    mild   . Anemia   . Anterolisthesis    grade 1: L3-4  . Anxiety   . Arthritis    pt states no pain   . Breast cancer ([place])    left   . Chronic kidney disease   . Dementia ([place])   . Depression   . Dyspnea    W/ PAIN   . Gastroesophageal reflux disease   . Headache    TO SEE NEURO MD  . Heart murmur    mild per pt.  . Heartburn   . Hypertension   . Hypothyroidism   . Leg cramps   . Post-menopause bleeding   . Restless leg syndrome 10/07/2014   Past Surgical History:  Procedure Laterality Date  . back sugery   2019   lower back   . BREAST LUMPECTOMY    . BREAST SURGERY Left    Lumpectomy   . CATARACT EXTRACTION, BILATERAL    . hysteroscopy biopsy      Allergies  Allergen Reactions  . Shellfish Allergy Swelling and Rash    MOUTH  . Neomy-Bacit-Polymyx-Pramoxine     UNSPECIFIED REACTION  Not sure which antibiotic she has a reaction to    Allergies as of 03/03/2019  Reactions   Shellfish Allergy Swelling, Rash   MOUTH   Neomy-bacit-polymyx-pramoxine    UNSPECIFIED REACTION  Not sure which antibiotic she has a reaction to      Medication List       Accurate as of March 03, 2019  1:17 PM. If you have any questions, ask your nurse or doctor.        acetaminophen 500 MG tablet Commonly known as: TYLENOL Take 500 mg by mouth daily as needed.   amLODipine 10 MG tablet Commonly known as:  NORVASC Take 1 tablet (10 mg total) by mouth daily.   aspirin 81 MG EC tablet Take by mouth.   bumetanide 2 MG tablet Commonly known as: BUMEX Take 2 mg by mouth daily.   busPIRone 10 MG tablet Commonly known as: BUSPAR TAKE 1 TABLET BY MOUTH TWICE DAILY .DX F41.9 APPT IS OVERDUE   Cod Liver Oil 1000 MG Caps Take 1 capsule by mouth 2 (two) times daily.   donepezil 5 MG tablet Commonly known as: ARICEPT Take one tablet by mouth once daily at bedtime   EYE DROPS OP Apply to eye as needed.   hydrALAZINE 100 MG tablet Commonly known as: APRESOLINE TAKE 1 TABLET (100 MG TOTAL) BY MOUTH 3 (THREE) TIMES DAILY.   levothyroxine 75 MCG tablet Commonly known as: SYNTHROID Take one tablet by mouth once daily 30 minutes before breakfast for thyroid   losartan 100 MG tablet Commonly known as: COZAAR TAKE 1 TABLET BY MOUTH EVERYDAY AT BEDTIME   metoprolol succinate 100 MG 24 hr tablet Commonly known as: TOPROL-XL TAKE 1 TABLET BY MOUTH EVERYDAY AT BEDTIME   traZODone 50 MG tablet Commonly known as: DESYREL TAKE 1 TABLET BY MOUTH EVERYDAY AT BEDTIME       Review of Systems  Constitutional: Negative for appetite change, chills, fatigue and fever.  HENT: Negative for congestion, rhinorrhea, sinus pressure, sinus pain, sneezing, sore throat and trouble swallowing.   Eyes: Negative for pain, discharge, redness, itching and visual disturbance.       Dry eyes   Respiratory: Negative for cough, chest tightness, shortness of breath and wheezing.   Cardiovascular: Negative for chest pain, palpitations and leg swelling.  Gastrointestinal: Negative for abdominal distention, abdominal pain, constipation, diarrhea, nausea and vomiting.  Endocrine: Negative for cold intolerance, heat intolerance, polydipsia, polyphagia and polyuria.  Genitourinary: Positive for vaginal discharge. Negative for decreased urine volume, difficulty urinating, dysuria, flank pain, frequency, hematuria and vaginal  pain.  Musculoskeletal: Positive for arthralgias and back pain. Negative for gait problem and myalgias.       Chronic intermittent back pain   Skin: Negative for color change, pallor and rash.  Neurological: Negative for dizziness, speech difficulty, weakness, light-headedness, numbness and headaches.  Hematological: Does not bruise/bleed easily.  Psychiatric/Behavioral: Positive for sleep disturbance. Negative for agitation, behavioral problems and confusion. The patient is not nervous/anxious.        Sleeps about every 3 hrs.  Memory loss     Immunization History  Administered Date(s) Administered  . Influenza, High Dose Seasonal PF 01/22/2017, 10/14/2017  . Influenza,inj,Quad PF,6+ Mos 10/26/2014, 09/11/2015  . Influenza-Unspecified 12/03/2013, 10/26/2014  . Pneumococcal Conjugate-13 10/26/2014  . Pneumococcal Polysaccharide-23 12/03/2013, 11/13/2015  . Tdap 01/14/2009  . Zoster 12/03/2013   Pertinent  Health Maintenance Due  Topic Date Due  . INFLUENZA VACCINE  08/15/2018  . MAMMOGRAM  10/08/2019  . COLONOSCOPY  10/08/2021  . DEXA SCAN  Completed  . PNA vac Low Risk Adult  Completed   Fall Risk  08/26/2018 08/06/2018 05/05/2018 12/24/2017 10/14/2017  Falls in the past year? 0 0 0 0 No  Number falls in past yr: 0 0 0 - -  Injury with Fall? 0 0 0 - -    Vitals:   03/03/19 1304  BP: (!) 140/55  Pulse: 70  Resp: 20  Temp: (!) 96.9 F (36.1 C)  TempSrc: Oral  SpO2: 99%  Weight: 197 lb 6.4 oz (89.5 kg)  Height: '5\' 6"'$  (1.676 m)   Body mass index is 31.86 kg/m. Physical Exam Vitals reviewed.  Constitutional:      General: She is not in acute distress.    Appearance: She is obese. She is not ill-appearing.  HENT:     Head: Normocephalic.     Right Ear: Tympanic membrane, ear canal and external ear normal. There is no impacted cerumen.     Left Ear: Tympanic membrane, ear canal and external ear normal. There is no impacted cerumen.     Nose: Nose normal. No congestion  or rhinorrhea.     Mouth/Throat:     Mouth: Mucous membranes are moist.     Pharynx: Oropharynx is clear. No oropharyngeal exudate or posterior oropharyngeal erythema.  Eyes:     General: No scleral icterus.       Right eye: No discharge.        Left eye: No discharge.     Extraocular Movements: Extraocular movements intact.     Conjunctiva/sclera: Conjunctivae normal.     Pupils: Pupils are equal, round, and reactive to light.  Neck:     Vascular: No carotid bruit.  Cardiovascular:     Rate and Rhythm: Normal rate and regular rhythm.     Pulses: Normal pulses.     Heart sounds: Murmur present. No friction rub. No gallop.   Pulmonary:     Effort: Pulmonary effort is normal. No respiratory distress.     Breath sounds: Normal breath sounds. No wheezing, rhonchi or rales.  Chest:     Chest wall: No tenderness.  Abdominal:     General: Bowel sounds are normal. There is no distension.     Palpations: Abdomen is soft. There is no mass.     Tenderness: There is no abdominal tenderness. There is no right CVA tenderness, left CVA tenderness, guarding or rebound.  Musculoskeletal:        General: No swelling or tenderness. Normal range of motion.     Cervical back: Normal range of motion. No rigidity or tenderness.     Right lower leg: No edema.     Left lower leg: No edema.  Lymphadenopathy:     Cervical: No cervical adenopathy.  Skin:    General: Skin is warm and dry.     Coloration: Skin is not pale.     Findings: No bruising, erythema or rash.  Neurological:     Mental Status: Mental status is at baseline.     Cranial Nerves: No cranial nerve deficit.     Sensory: No sensory deficit.     Motor: No weakness.     Coordination: Coordination normal.  Psychiatric:        Mood and Affect: Mood normal.        Speech: Speech normal.        Behavior: Behavior normal.        Thought Content: Thought content normal.        Cognition and Memory: Memory is impaired.  Judgment:  Judgment normal.    Labs reviewed: Recent Labs    08/06/18 1053  NA 139  K 4.3  CL 103  CO2 26  GLUCOSE 104  BUN 33*  CREATININE 2.30*  CALCIUM 9.2   Recent Labs    08/06/18 1053  AST 18  ALT 13  BILITOT 0.3  PROT 7.3   Recent Labs    08/06/18 1053 08/18/18 0932 09/24/18 1031  WBC 4.4 4.1 4.2  NEUTROABS 2,248  --  1.7  HGB 11.2* 10.6* 12.0  HCT 33.8* 31.3* 35.2*  MCV 92.1 92.9 92.6  PLT 260 228 261.0   Lab Results  Component Value Date   TSH 0.37 (L) 08/06/2018   No results found for: HGBA1C Lab Results  Component Value Date   CHOL 146 08/06/2018   HDL 42 (L) 08/06/2018   LDLCALC 78 08/06/2018   TRIG 164 (H) 08/06/2018   CHOLHDL 3.5 08/06/2018    Significant Diagnostic Results in last 30 days:  US Transvaginal Non-OB  Result Date: 02/16/2019 T/V images.  Anteverted uterus with inhomogenous myometrium with at least 2 focal intramural fibroids, measured at 3.15 and 2.03 cm.  The uterus is measured at 9.58 x 7.07 x 5.8 cm.  Thickened endometrial lining with irregular margins measuring 17.17 mm.  Feeder vessels noted leading to areas of thickening.  Bilateral ovaries small with atrophic appearance.  No adnexal mass.  No free fluid in the posterior cul-de-sac.    Assessment/Plan 1. Pre-op examination - EKG 12-Lead indicates Sinus Rhythm with first degree AV block and T-abnormality anterior/lateral ischemia HR 65 b/min no change from previous EKG done 10/25/2017. Due to her advance age and abnormal EKG will refer to Cardiologist for medical clearance for Hysterectomy.Discussed with patient and daughter who verbalized understanding.[facility] called by [place] and notified of patient's referral to cardiologist for Pre-op clearance. - Ambulatory referral to Cardiology  2. Essential hypertension B/p at baseline.continue on losartan 100 mg tablet daily,Metoprolol succinate 100 mg 24 Hr tablet at bedtime,Amlodipine 10 mg tablet daily,Bumex 2  mg tablet daily and Hydralazine 100 mg tablet three times daily ASA 81 mg EC tablet daily for cardiac event prevention. - CBC with Differential/Platelet; Future - CMP with eGFR([facility]); Future - Lipid panel; Future  3. Hypothyroidism, unspecified type Lab Results  Component Value Date   TSH 0.37 (L) 08/06/2018  Continue on Levothyroxine 75 mcg tablet daily. - TSH; Future  4. Generalized anxiety disorder Stable.continue on Buspirone 10 mg tablet daily   5. Insomnia, unspecified type Continue On Trazodone 50 mg tablet at bedtime.  6. Hyperlipidemia Continue on on cod liver oil 1000 mg capsule twice daily and dietary modification and exercise as tolerated.  7.Vaginal Bleeding   Ongoing for several years.following up with Gynecology scheduled for possible Hysterectomy.Will refer to cardiology for medical clearance due to her advance age and abnormal EKG.   Family/ staff Communication: Reviewed plan of care with patient and daughter verbalized understanding.  Labs/tests ordered:  - CBC with Differential/Platelet; Future - CMP with eGFR([facility]); Future - Lipid panel; Future - TSH; Future  Next Appointment : 6 months with PCP Dani Gobble for medical management of chronic issues.   Sandrea Hughs, NP

## 2019-03-03 NOTE — Patient Instructions (Signed)
-   Referral placed for Cardiology for Pre-op clearance.specialist office will call you for appointment.

## 2019-03-03 NOTE — Telephone Encounter (Signed)
Patient was seen by her PCP today to get medical clearance for surgery at the request of [facility] anesthesia dept.  I received a call that PCP is referring her to cardiologist and clearance is pending until that is completed and outcome reviewed.

## 2019-03-05 ENCOUNTER — Ambulatory Visit: Payer: Medicare Other | Admitting: Nurse Practitioner

## 2019-03-10 ENCOUNTER — Other Ambulatory Visit: Payer: Self-pay

## 2019-03-10 ENCOUNTER — Other Ambulatory Visit: Payer: Medicare Other

## 2019-03-10 DIAGNOSIS — N184 Chronic kidney disease, stage 4 (severe): Secondary | ICD-10-CM | POA: Diagnosis not present

## 2019-03-10 LAB — CBC
HCT: 32.8 % — ABNORMAL LOW (ref 35.0–45.0)
Hemoglobin: 11.1 g/dL — ABNORMAL LOW (ref 11.7–15.5)
MCH: 31.4 pg (ref 27.0–33.0)
MCHC: 33.8 g/dL (ref 32.0–36.0)
MCV: 92.9 fL (ref 80.0–100.0)
MPV: 10.1 fL (ref 7.5–12.5)
Platelets: 278 10*3/uL (ref 140–400)
RBC: 3.53 10*6/uL — ABNORMAL LOW (ref 3.80–5.10)
RDW: 12.9 % (ref 11.0–15.0)
WBC: 4.1 10*3/uL (ref 3.8–10.8)

## 2019-03-14 NOTE — Progress Notes (Signed)
Cardiology Office Note:    Date:  03/16/2019   ID:  Regina Porter, DOB Apr 24, 1934, MRN [id number]  PCP:  [name], NP  Cardiologist:  Larae Grooms, MD  Electrophysiologist:  None   Referring MD: Sandrea Hughs, NP   No chief complaint on file.   History of Present Illness:    Regina Porter is a 85 y.o. female with a hx of CKD, hypertension who had a cardiac evaluation in 2018.  Monitor showed no arrhythmias.  Carotid Doppler showed minimal plaque bilaterally.  Echocardiogram showed normal LV function with age appropriate diastolic dysfunction.  Since the last visit, she has done well.  She has not been exercising much, but when she does, she feels well.  Denies : Chest pain. Dizziness. Leg edema. Nitroglycerin use. Orthopnea. Palpitations. Paroxysmal nocturnal dyspnea. Shortness of breath. Syncope.   Past Medical History:  Diagnosis Date  . Allergy    mild   . Anemia   . Anterolisthesis    grade 1: L3-4  . Anxiety   . Arthritis    pt states no pain   . Breast cancer ([place])    left   . Chronic kidney disease   . Dementia ([place])   . Depression   . Dyspnea    W/ PAIN   . Gastroesophageal reflux disease   . Headache    TO SEE NEURO MD  . Heart murmur    mild per pt.  . Heartburn   . Hypertension   . Hypothyroidism   . Leg cramps   . Post-menopause bleeding   . Restless leg syndrome 10/07/2014    Past Surgical History:  Procedure Laterality Date  . back sugery   2019   lower back   . BREAST LUMPECTOMY    . BREAST SURGERY Left    Lumpectomy   . CATARACT EXTRACTION, BILATERAL    . hysteroscopy biopsy      Current Medications: Current Meds  Medication Sig  . acetaminophen (TYLENOL) 500 MG tablet Take 500 mg by mouth daily as needed.  Marland Kitchen amLODipine (NORVASC) 10 MG tablet Take 1 tablet (10 mg total) by mouth daily.  Marland Kitchen aspirin 81 MG EC tablet Take by mouth.  . bumetanide (BUMEX) 2 MG tablet Take 2 mg by mouth daily.  . busPIRone (BUSPAR) 10 MG  tablet TAKE 1 TABLET BY MOUTH TWICE DAILY .DX F41.9 APPT IS OVERDUE  . Carboxymethylcellulose Sodium (EYE DROPS OP) Apply to eye as needed.  Marland Kitchen Cod Liver Oil 1000 MG CAPS Take 1 capsule by mouth 2 (two) times daily.   Marland Kitchen donepezil (ARICEPT) 5 MG tablet Take one tablet by mouth once daily at bedtime  . hydrALAZINE (APRESOLINE) 100 MG tablet TAKE 1 TABLET (100 MG TOTAL) BY MOUTH 3 (THREE) TIMES DAILY.  Marland Kitchen levothyroxine (SYNTHROID) 75 MCG tablet Take one tablet by mouth once daily 30 minutes before breakfast for thyroid  . losartan (COZAAR) 100 MG tablet TAKE 1 TABLET BY MOUTH EVERYDAY AT BEDTIME  . metoprolol succinate (TOPROL-XL) 100 MG 24 hr tablet TAKE 1 TABLET BY MOUTH EVERYDAY AT BEDTIME  . traZODone (DESYREL) 50 MG tablet TAKE 1 TABLET BY MOUTH EVERYDAY AT BEDTIME     Allergies:   Shellfish allergy and Neomy-bacit-polymyx-pramoxine   Social History   Socioeconomic History  . Marital status: Widowed    Spouse name: Not on file  . Number of children: 5  . Years of education: 76  . Highest education level: Not on file  Occupational History  . Occupation: Retired  Tobacco Use  . Smoking status: Former Smoker    Packs/day: 0.10    Years: 20.00    Pack years: 2.00    Quit date: 2008    Years since quitting: 13.1  . Smokeless tobacco: Never Used  . Tobacco comment: Quit at  age 72  Substance and Sexual Activity  . Alcohol use: No    Alcohol/week: 0.0 standard drinks  . Drug use: No  . Sexual activity: Never  Other Topics Concern  . Not on file  Social History Narrative   Diet?       Do you drink/eat things with caffeine? yes      Marital status?                widow                    What year were you married?      Do you live in a house, apartment, assisted living, condo, trailer, etc.? house      Is it one or more stories? yes      How many persons live in your home? 2      Do you have any pets in your home? (please list) no      Current or past profession:       Do you exercise?         no                             Type & how often? none      Do you have a living will? no   Do you have a DNR form?  no                                If not, do you want to discuss one? no      Do you have signed POA/HPOA for forms?    Social Determinants of Health   Financial Resource Strain:   . Difficulty of Paying Living Expenses: Not on file  Food Insecurity:   . Worried About Charity fundraiser in the Last Year: Not on file  . Ran Out of Food in the Last Year: Not on file  Transportation Needs:   . Lack of Transportation (Medical): Not on file  . Lack of Transportation (Non-Medical): Not on file  Physical Activity:   . Days of Exercise per Week: Not on file  . Minutes of Exercise per Session: Not on file  Stress:   . Feeling of Stress : Not on file  Social Connections:   . Frequency of Communication with Friends and Family: Not on file  . Frequency of Social Gatherings with Friends and Family: Not on file  . Attends Religious Services: Not on file  . Active Member of Clubs or Organizations: Not on file  . Attends Archivist Meetings: Not on file  . Marital Status: Not on file     Family History: The patient's family history includes Arthritis in her mother; Breast cancer in her paternal aunt; Cancer in her brother; Heart disease in her father and mother; Hypertension in her father and mother. There is no history of Colon cancer, Colon polyps, Esophageal cancer, Rectal cancer, or Stomach cancer.  ROS:   Please see the history of present illness.  Eats out a fair bit.  All other systems reviewed and are negative.  EKGs/Labs/Other Studies Reviewed:    The following studies were reviewed today:   EKG:  EKG is not ordered today.  The ekg ordered 03/03/19 demonstrates NSR, LVH  Recent Labs: 08/06/2018: ALT 13; BUN 33; Creat 2.30; Potassium 4.3; Sodium 139; TSH 0.37 03/10/2019: Hemoglobin 11.1; Platelets 278  Recent Lipid Panel      Component Value Date/Time   CHOL 146 08/06/2018 1053   TRIG 164 (H) 08/06/2018 1053   HDL 42 (L) 08/06/2018 1053   CHOLHDL 3.5 08/06/2018 1053   VLDL 25 08/10/2015 1013   LDLCALC 78 08/06/2018 1053    Physical Exam:    VS:  BP (!) 140/48   Pulse 75   Ht 5\' 5"  (1.651 m)   Wt 199 lb 12.8 oz (90.6 kg)   SpO2 98%   BMI 33.25 kg/m     Wt Readings from Last 3 Encounters:  03/16/19 199 lb 12.8 oz (90.6 kg)  03/03/19 197 lb 6.4 oz (89.5 kg)  02/03/19 198 lb (89.8 kg)     GEN:  Well nourished, well developed in no acute distress HEENT: Normal NECK: No JVD; No carotid bruits LYMPHATICS: No lymphadenopathy CARDIAC: RRR, 2/6 systolic murmurs, no rubs, gallops RESPIRATORY:  Clear to auscultation without rales, wheezing or rhonchi  ABDOMEN: Soft, non-tender, non-distended MUSCULOSKELETAL:  No edema; No deformity  SKIN: Warm and dry NEUROLOGIC:  Alert and oriented x 3 PSYCHIATRIC:  Normal affect   ASSESSMENT:    1. Diastolic dysfunction without heart failure   2. CKD (chronic kidney disease) stage 4, GFR 15-29 ml/min (HCC)   3. Essential hypertension    PLAN:    In order of problems listed above:  1. Hypertension: Borderline control.  062 systolic typically at home. Continue current meds. 2. CKD, stage IV: We have tried to avoid tests that would include IV contrast dye due to fear of worsening her kidney function.  Avoid nephrotoxins in general.  Se has not seen nephrology since [place]. 3. Diastolic heart failure, chronic: Appears euvolemic.  She avoids salt.  Her daughter cooks and they avoid processed foods.  They do eat out a fair bit.Marland Kitcheneating "soulfood."  We discussed decreased fried food, fatty food, and salty foods.  She drinks some Sprite- instructed to cut back on sodas.   Medication Adjustments/Labs and Tests Ordered: Current medicines are reviewed at length with the patient today.  Concerns regarding medicines are outlined above.  No orders of the defined types were  placed in this encounter.  No orders of the defined types were placed in this encounter.   There are no Patient Instructions on file for this visit.   Signed, Larae Grooms, MD  03/16/2019 9:52 AM    [facility]

## 2019-03-16 ENCOUNTER — Other Ambulatory Visit: Payer: Self-pay

## 2019-03-16 ENCOUNTER — Ambulatory Visit (INDEPENDENT_AMBULATORY_CARE_PROVIDER_SITE_OTHER): Payer: Medicare Other | Admitting: Interventional Cardiology

## 2019-03-16 ENCOUNTER — Encounter: Payer: Self-pay | Admitting: Interventional Cardiology

## 2019-03-16 VITALS — BP 140/48 | HR 75 | Ht 65.0 in | Wt 199.8 lb

## 2019-03-16 DIAGNOSIS — N184 Chronic kidney disease, stage 4 (severe): Secondary | ICD-10-CM

## 2019-03-16 DIAGNOSIS — I5189 Other ill-defined heart diseases: Secondary | ICD-10-CM

## 2019-03-16 DIAGNOSIS — I10 Essential (primary) hypertension: Secondary | ICD-10-CM | POA: Diagnosis not present

## 2019-03-16 NOTE — Patient Instructions (Signed)
Medication Instructions:  Your physician recommends that you continue on your current medications as directed. Please refer to the Current Medication list given to you today.  *If you need a refill on your cardiac medications before your next appointment, please call your pharmacy*   Lab Work: None ordered  If you have labs (blood work) drawn today and your tests are completely normal, you will receive your results only by: Marland Kitchen MyChart Message (if you have MyChart) OR . A paper copy in the mail If you have any lab test that is abnormal or we need to change your treatment, we will call you to review the results.   Testing/Procedures: None ordered   Follow-Up: At [facility], you and your health needs are our priority.  As part of our continuing mission to provide you with exceptional heart care, we have created designated Provider Care Teams.  These Care Teams include your primary Cardiologist (physician) and Advanced Practice Providers (APPs -  Physician Assistants and Nurse Practitioners) who all work together to provide you with the care you need, when you need it.  We recommend signing up for the patient portal called "MyChart".  Sign up information is provided on this After Visit Summary.  MyChart is used to connect with patients for Virtual Visits (Telemedicine).  Patients are able to view lab/test results, encounter notes, upcoming appointments, etc.  Non-urgent messages can be sent to your provider as well.   To learn more about what you can do with MyChart, go to [email or web address].    Your next appointment:   12 month(s)  The format for your next appointment:   In Person  Provider:   You may see Larae Grooms, MD or one of the following Advanced Practice Providers on your designated Care Team:    Melina Copa, PA-C  Ermalinda Barrios, PA-C    Other Instructions We are recommending the COVID-19 vaccine to all of our patients. Cardiac medications (including blood  thinners) should not deter anyone from being vaccinated and there is no need to hold any of those medications prior to vaccine administration.   Currently, there is a hotline to call (active 01/22/19) to schedule vaccination appointments as no walk-ins will be accepted.    Vaccines through the health department can be arranged by calling [phone]    Vaccines through [facility] can be arranged by calling [phone] or visiting [email or web address]   If you have further questions or concerns about the vaccine process, please visit [email or web address], [email or web address], or contact your primary care physician.    High-Fiber Diet Fiber, also called dietary fiber, is a type of carbohydrate that is found in fruits, vegetables, whole grains, and beans. A high-fiber diet can have many health benefits. Your health care provider may recommend a high-fiber diet to help:  Prevent constipation. Fiber can make your bowel movements more regular.  Lower your cholesterol.  Relieve the following conditions: ? Swelling of veins in the anus (hemorrhoids). ? Swelling and irritation (inflammation) of specific areas of the digestive tract (uncomplicated diverticulosis). ? A problem of the large intestine (colon) that sometimes causes pain and diarrhea (irritable bowel syndrome, IBS).  Prevent overeating as part of a weight-loss plan.  Prevent heart disease, type 2 diabetes, and certain cancers. What is my plan? The recommended daily fiber intake in grams (g) includes:  38 g for men age 16 or younger.  30 g for men over age 31.  71 g for women age 47 or younger.  21 g for women over  age 27. You can get the recommended daily intake of dietary fiber by:  Eating a variety of fruits, vegetables, grains, and beans.  Taking a fiber supplement, if it is not possible to get enough fiber through your diet. What do I need to know about a high-fiber diet?  It is better to get  fiber through food sources rather than from fiber supplements. There is not a lot of research about how effective supplements are.  Always check the fiber content on the nutrition facts label of any prepackaged food. Look for foods that contain 5 g of fiber or more per serving.  Talk with a diet and nutrition specialist (dietitian) if you have questions about specific foods that are recommended or not recommended for your medical condition, especially if those foods are not listed below.  Gradually increase how much fiber you consume. If you increase your intake of dietary fiber too quickly, you may have bloating, cramping, or gas.  Drink plenty of water. Water helps you to digest fiber. What are tips for following this plan?  Eat a wide variety of high-fiber foods.  Make sure that half of the grains that you eat each day are whole grains.  Eat breads and cereals that are made with whole-grain flour instead of refined flour or white flour.  Eat brown rice, bulgur wheat, or millet instead of white rice.  Start the day with a breakfast that is high in fiber, such as a cereal that contains 5 g of fiber or more per serving.  Use beans in place of meat in soups, salads, and pasta dishes.  Eat high-fiber snacks, such as berries, raw vegetables, nuts, and popcorn.  Choose whole fruits and vegetables instead of processed forms like juice or sauce. What foods can I eat?  Fruits Berries. Pears. Apples. Oranges. Avocado. Prunes and raisins. Dried figs. Vegetables Sweet potatoes. Spinach. Kale. Artichokes. Cabbage. Broccoli. Cauliflower. Green peas. Carrots. Squash. Grains Whole-grain breads. Multigrain cereal. Oats and oatmeal. Brown rice. Barley. Bulgur wheat. [place]. Quinoa. Bran muffins. Popcorn. Rye wafer crackers. Meats and other proteins Navy, kidney, and pinto beans. Soybeans. Split peas. Lentils. Nuts and seeds. Dairy Fiber-fortified yogurt. Beverages Fiber-fortified soy milk.  Fiber-fortified orange juice. Other foods Fiber bars. The items listed above may not be a complete list of recommended foods and beverages. Contact a dietitian for more options. What foods are not recommended? Fruits Fruit juice. Cooked, strained fruit. Vegetables Fried potatoes. Canned vegetables. Well-cooked vegetables. Grains White bread. Pasta made with refined flour. White rice. Meats and other proteins Fatty cuts of meat. Fried chicken or fried fish. Dairy Milk. Yogurt. Cream cheese. Sour cream. Fats and oils Butters. Beverages Soft drinks. Other foods Cakes and pastries. The items listed above may not be a complete list of foods and beverages to avoid. Contact a dietitian for more information. Summary  Fiber is a type of carbohydrate. It is found in fruits, vegetables, whole grains, and beans.  There are many health benefits of eating a high-fiber diet, such as preventing constipation, lowering blood cholesterol, helping with weight loss, and reducing your risk of heart disease, diabetes, and certain cancers.  Gradually increase your intake of fiber. Increasing too fast can result in cramping, bloating, and gas. Drink plenty of water while you increase your fiber.  The best sources of fiber include whole fruits and vegetables, whole grains, nuts, seeds, and beans. This information is not intended to replace advice given to you by your health care provider. Make sure you  discuss any questions you have with your health care provider. Document Revised: 11/04/2016 Document Reviewed: 11/04/2016 Elsevier Patient Education  2020 [employer].

## 2019-03-20 ENCOUNTER — Ambulatory Visit: Payer: Medicare Other | Attending: Internal Medicine

## 2019-03-20 DIAGNOSIS — Z23 Encounter for immunization: Secondary | ICD-10-CM | POA: Insufficient documentation

## 2019-03-20 NOTE — Progress Notes (Signed)
   Covid-19 Vaccination Clinic  Name:  Regina Porter    MRN: [id number] DOB: [birth year]  03/20/2019  [name] was observed post Covid-19 immunization for 15 minutes without incident. She was provided with Vaccine Information Sheet and instruction to access the V-Safe system.   [name] was instructed to call 911 with any severe reactions post vaccine: Marland Kitchen Difficulty breathing  . Swelling of face and throat  . A fast heartbeat  . A bad rash all over body  . Dizziness and weakness   Immunizations Administered    Name Date Dose VIS Date Route   Pfizer COVID-19 Vaccine 03/20/2019  3:33 PM 0.3 mL 12/25/2018 Intramuscular   Manufacturer: [place]   Lot: [id number]   [place]: [id number]

## 2019-04-07 ENCOUNTER — Other Ambulatory Visit: Payer: Medicare Other

## 2019-04-10 ENCOUNTER — Ambulatory Visit: Payer: Medicare Other | Attending: Internal Medicine

## 2019-04-10 DIAGNOSIS — Z23 Encounter for immunization: Secondary | ICD-10-CM

## 2019-04-10 NOTE — Progress Notes (Signed)
   Covid-19 Vaccination Clinic  Name:  Regina Porter    MRN: [id number] DOB: [birth year]  04/10/2019  [name] was observed post Covid-19 immunization for 15 minutes without incident. She was provided with Vaccine Information Sheet and instruction to access the V-Safe system.   [name] was instructed to call 911 with any severe reactions post vaccine: Marland Kitchen Difficulty breathing  . Swelling of face and throat  . A fast heartbeat  . A bad rash all over body  . Dizziness and weakness   Immunizations Administered    Name Date Dose VIS Date Route   Pfizer COVID-19 Vaccine 04/10/2019  1:56 PM 0.3 mL 12/25/2018 Intramuscular   Manufacturer: [place]   Lot: [id number]   [place]: [id number]

## 2019-04-21 ENCOUNTER — Telehealth: Payer: Self-pay

## 2019-04-21 NOTE — Telephone Encounter (Signed)
I re-faxed medical clearance form to [facility] as I am still awaiting medical clearance for patient to have surgery scheduled.

## 2019-05-01 ENCOUNTER — Other Ambulatory Visit: Payer: Self-pay | Admitting: Nurse Practitioner

## 2019-05-01 DIAGNOSIS — F419 Anxiety disorder, unspecified: Secondary | ICD-10-CM

## 2019-05-03 NOTE — Telephone Encounter (Signed)
Patient has upcoming appointment with provider [name], NP 05/06/2019. Medications pend and sent to provider for any necessary adjustments.

## 2019-05-04 ENCOUNTER — Other Ambulatory Visit: Payer: Self-pay | Admitting: Nurse Practitioner

## 2019-05-04 DIAGNOSIS — G3184 Mild cognitive impairment, so stated: Secondary | ICD-10-CM

## 2019-05-06 ENCOUNTER — Encounter: Payer: Self-pay | Admitting: Nurse Practitioner

## 2019-05-06 ENCOUNTER — Telehealth: Payer: Self-pay

## 2019-05-06 ENCOUNTER — Other Ambulatory Visit: Payer: Self-pay

## 2019-05-06 ENCOUNTER — Ambulatory Visit (INDEPENDENT_AMBULATORY_CARE_PROVIDER_SITE_OTHER): Payer: Medicare Other | Admitting: Nurse Practitioner

## 2019-05-06 DIAGNOSIS — E2839 Other primary ovarian failure: Secondary | ICD-10-CM

## 2019-05-06 DIAGNOSIS — Z0000 Encounter for general adult medical examination without abnormal findings: Secondary | ICD-10-CM | POA: Diagnosis not present

## 2019-05-06 NOTE — Progress Notes (Signed)
Subjective:   Regina Porter is a 85 y.o. female who presents for Medicare Annual (Subsequent) preventive examination.  Review of Systems:   Cardiac Risk Factors include: advanced age (>41men, >63 women);hypertension;obesity (BMI >30kg/m2);sedentary lifestyle     Objective:     Vitals: There were no vitals taken for this visit.  There is no height or weight on file to calculate BMI.  Advanced Directives 05/06/2019 03/03/2019 08/06/2018 05/05/2018 05/05/2018 01/02/2018 12/24/2017  Does Patient Have a Medical Advance Directive? No No No No No No No  Would patient like information on creating a medical advance directive? - - Yes (MAU/Ambulatory/Procedural Areas - Information given) Yes (MAU/Ambulatory/Procedural Areas - Information given) No - Patient declined - -    Tobacco Social History   Tobacco Use  Smoking Status Former Smoker  . Packs/day: 0.10  . Years: 20.00  . Pack years: 2.00  . Quit date: 2008  . Years since quitting: 13.3  Smokeless Tobacco Never Used  Tobacco Comment   Quit at  age 67     Counseling given: Not Answered Comment: Quit at  age 71   Clinical Intake:  Pre-visit preparation completed: Yes  Pain : No/denies pain     BMI - recorded: 33.2 Nutritional Risks: None Diabetes: No  How often do you need to have someone help you when you read instructions, pamphlets, or other written materials from your doctor or pharmacy?: 3 - Sometimes        Past Medical History:  Diagnosis Date  . Allergy    mild   . Anemia   . Anterolisthesis    grade 1: L3-4  . Anxiety   . Arthritis    pt states no pain   . Breast cancer ([place])    left   . Chronic kidney disease   . Dementia ([place])   . Depression   . Dyspnea    W/ PAIN   . Gastroesophageal reflux disease   . Headache    TO SEE NEURO MD  . Heart murmur    mild per pt.  . Heartburn   . Hypertension   . Hypothyroidism   . Leg cramps   . Post-menopause bleeding   . Restless leg syndrome  10/07/2014   Past Surgical History:  Procedure Laterality Date  . back sugery   2019   lower back   . BREAST LUMPECTOMY    . BREAST SURGERY Left    Lumpectomy   . CATARACT EXTRACTION, BILATERAL    . hysteroscopy biopsy     Family History  Problem Relation Age of Onset  . Hypertension Mother   . Arthritis Mother   . Heart disease Mother   . Hypertension Father   . Heart disease Father   . Cancer Brother   . Breast cancer Paternal Aunt   . Colon cancer Neg Hx   . Colon polyps Neg Hx   . Esophageal cancer Neg Hx   . Rectal cancer Neg Hx   . Stomach cancer Neg Hx    Social History   Socioeconomic History  . Marital status: Widowed    Spouse name: Not on file  . Number of children: 5  . Years of education: 61  . Highest education level: Not on file  Occupational History  . Occupation: Retired  Tobacco Use  . Smoking status: Former Smoker    Packs/day: 0.10    Years: 20.00    Pack years: 2.00    Quit date: 2008  Years since quitting: 13.3  . Smokeless tobacco: Never Used  . Tobacco comment: Quit at  age 89  Substance and Sexual Activity  . Alcohol use: No    Alcohol/week: 0.0 standard drinks  . Drug use: No  . Sexual activity: Never  Other Topics Concern  . Not on file  Social History Narrative   Diet?       Do you drink/eat things with caffeine? yes      Marital status?                widow                    What year were you married?      Do you live in a house, apartment, assisted living, condo, trailer, etc.? house      Is it one or more stories? yes      How many persons live in your home? 2      Do you have any pets in your home? (please list) no      Current or past profession:      Do you exercise?         no                             Type & how often? none      Do you have a living will? no   Do you have a DNR form?  no                                If not, do you want to discuss one? no      Do you have signed POA/HPOA for forms?      Social Determinants of Health   Financial Resource Strain:   . Difficulty of Paying Living Expenses:   Food Insecurity:   . Worried About Charity fundraiser in the Last Year:   . Arboriculturist in the Last Year:   Transportation Needs:   . Film/video editor (Medical):   Marland Kitchen Lack of Transportation (Non-Medical):   Physical Activity:   . Days of Exercise per Week:   . Minutes of Exercise per Session:   Stress:   . Feeling of Stress :   Social Connections:   . Frequency of Communication with Friends and Family:   . Frequency of Social Gatherings with Friends and Family:   . Attends Religious Services:   . Active Member of Clubs or Organizations:   . Attends Archivist Meetings:   Marland Kitchen Marital Status:     Outpatient Encounter Medications as of 05/06/2019  Medication Sig  . acetaminophen (TYLENOL) 500 MG tablet Take 500 mg by mouth daily as needed.  Marland Kitchen amLODipine (NORVASC) 10 MG tablet Take 1 tablet (10 mg total) by mouth daily.  Marland Kitchen aspirin 81 MG EC tablet Take by mouth.  . bumetanide (BUMEX) 2 MG tablet Take 2 mg by mouth daily.  . busPIRone (BUSPAR) 10 MG tablet TAKE 1 TABLET BY MOUTH TWICE DAILY .DX F41.9 APPT IS OVERDUE  . Carboxymethylcellulose Sodium (EYE DROPS OP) Apply to eye as needed.  Marland Kitchen Cod Liver Oil 1000 MG CAPS Take 1 capsule by mouth 2 (two) times daily.   Marland Kitchen donepezil (ARICEPT) 5 MG tablet TAKE 1 TABLET BY MOUTH EVERYDAY AT BEDTIME Dx: G31.84  .  hydrALAZINE (APRESOLINE) 100 MG tablet TAKE 1 TABLET (100 MG TOTAL) BY MOUTH 3 (THREE) TIMES DAILY.  Marland Kitchen levothyroxine (SYNTHROID) 75 MCG tablet Take one tablet by mouth once daily 30 minutes before breakfast for thyroid  . losartan (COZAAR) 100 MG tablet TAKE 1 TABLET BY MOUTH EVERYDAY AT BEDTIME  . metoprolol succinate (TOPROL-XL) 100 MG 24 hr tablet TAKE 1 TABLET BY MOUTH EVERYDAY AT BEDTIME  . traZODone (DESYREL) 50 MG tablet TAKE 1 TABLET BY MOUTH EVERYDAY AT BEDTIME   No facility-administered encounter  medications on file as of 05/06/2019.    Activities of Daily Living In your present state of health, do you have any difficulty performing the following activities: 05/06/2019  Hearing? N  Vision? N  Difficulty concentrating or making decisions? N  Walking or climbing stairs? N  Dressing or bathing? N  Doing errands, shopping? Y  Comment daughter helps  Conservation officer, nature and eating ? Y  Comment Daughter helps  Using the Toilet? N  In the past six months, have you accidently leaked urine? Y  Do you have problems with loss of bowel control? N  Managing your Medications? N  Managing your Finances? Y  Comment daughter helps  Housekeeping or managing your Housekeeping? Y  Comment daughter helps  Some recent data might be hidden    Patient Care Team: [name], NP as PCP - General (Geriatric Medicine) Jettie Booze, MD as PCP - Cardiology (Cardiology) Dustin Folks, MD as Consulting Physician (Hematology and Oncology) Esperanza Heir, MD as Consulting Physician (Obstetrics and Gynecology) Consuella Lose, MD as Consulting Physician (Neurosurgery) Deterding, Jeneen Rinks, MD as Consulting Physician (Nephrology)    Assessment:   This is a routine wellness examination for Shawn.  Exercise Activities and Dietary recommendations Current Exercise Habits: Home exercise routine, Type of exercise: walking;stretching, Time (Minutes): 15, Frequency (Times/Week): 3, Weekly Exercise (Minutes/Week): 45  Goals    . Increase water intake     Starting 11/13/15, I will attempt to increase my water intake from 4 glasses to 6 glasses per day and try to add in exercise (walking around house) dailly.        Fall Risk Fall Risk  05/06/2019 08/26/2018 08/06/2018 05/05/2018 12/24/2017  Falls in the past year? 0 0 0 0 0  Number falls in past yr: 0 0 0 0 -  Injury with Fall? 0 0 0 0 -   Is the patient's home free of loose throw rugs in walkways, pet beds, electrical cords, etc?   yes       Grab bars in the bathroom? yes      Handrails on the stairs?   yes      Adequate lighting?   yes  Timed Get Up and Go performed: na  Depression Screen PHQ 2/9 Scores 05/06/2019 08/06/2018 05/05/2018 04/28/2017  PHQ - 2 Score 0 0 0 1     Cognitive Function MMSE - Mini Mental State Exam 04/28/2017 09/11/2015  Orientation to time 5 4  Orientation to Place 5 4  Registration 3 3  Attention/ Calculation 5 5  Recall 1 2  Language- name 2 objects 2 2  Language- repeat 1 1  Language- follow 3 step command 3 3  Language- read & follow direction 1 1  Write a sentence 1 1  Copy design 1 1  Total score 28 27     6CIT Screen 05/06/2019 05/05/2018  What Year? 0 points 0 points  What month? 0 points 0 points  What  time? 0 points 0 points  Count back from 20 0 points 0 points  Months in reverse 2 points 0 points  Repeat phrase 8 points 6 points  Total Score 10 6    Immunization History  Administered Date(s) Administered  . Influenza, High Dose Seasonal PF 01/22/2017, 10/14/2017  . Influenza,inj,Quad PF,6+ Mos 10/26/2014, 09/11/2015  . Influenza-Unspecified 12/03/2013, 10/26/2014  . PFIZER SARS-COV-2 Vaccination 03/20/2019, 04/10/2019  . Pneumococcal Conjugate-13 10/26/2014  . Pneumococcal Polysaccharide-23 12/03/2013, 11/13/2015  . Tdap 01/14/2009  . Zoster 12/03/2013    Qualifies for Shingles Vaccine?yes, recommended   Screening Tests Health Maintenance  Topic Date Due  . TETANUS/TDAP  01/15/2019  . INFLUENZA VACCINE  08/15/2019  . MAMMOGRAM  10/08/2019  . COLONOSCOPY  10/08/2021  . DEXA SCAN  Completed  . COVID-19 Vaccine  Completed  . PNA vac Low Risk Adult  Completed    Cancer Screenings: Lung: Low Dose CT Chest recommended if Age 80-80 years, 30 pack-year currently smoking OR have quit w/in 15years. Patient does not qualify. Breast:  Up to date on Mammogram? Yes   Up to date of Bone Density/Dexa? No Colorectal: up to date  Additional Screenings:  Hepatitis C  Screening: na     Plan:      I have personally reviewed and noted the following in the patient's chart:   . Medical and social history . Use of alcohol, tobacco or illicit drugs  . Current medications and supplements . Functional ability and status . Nutritional status . Physical activity . Advanced directives . List of other physicians . Hospitalizations, surgeries, and ER visits in previous 12 months . Vitals . Screenings to include cognitive, depression, and falls . Referrals and appointments  In addition, I have reviewed and discussed with patient certain preventive protocols, quality metrics, and best practice recommendations. A written personalized care plan for preventive services as well as general preventive health recommendations were provided to patient.     [name], NP  05/06/2019

## 2019-05-06 NOTE — Patient Instructions (Signed)
Regina Porter , Thank you for taking time to come for your Medicare Wellness Visit. I appreciate your ongoing commitment to your health goals. Please review the following plan we discussed and let me know if I can assist you in the future.   Screening recommendations/referrals: Colonoscopy up to date Mammogram up to date  Bone Density RECOMMENDED- order placed, to call [facility] to schedule Recommended yearly ophthalmology/optometry visit for glaucoma screening and checkup Recommended yearly dental visit for hygiene and checkup  Vaccinations: Influenza vaccine up to date Pneumococcal vaccine up to date Tdap vaccine up to date Shingles vaccine RECOMMENDED, to get at your local pharmacy- shingrix     Advanced directives: not on file, recommend for you to complete and bring back to office for [place] to place on file. Also recommend completing MOST form in OFFICE with Aarush Stukey  Conditions/risks identified: progressive memory loss, weakness, debility   Next appointment: 1 year    Preventive Care 83 Years and Older, Female Preventive care refers to lifestyle choices and visits with your health care provider that can promote health and wellness. What does preventive care include?  A yearly physical exam. This is also called an annual well check.  Dental exams once or twice a year.  Routine eye exams. Ask your health care provider how often you should have your eyes checked.  Personal lifestyle choices, including:  Daily care of your teeth and gums.  Regular physical activity.  Eating a healthy diet.  Avoiding tobacco and drug use.  Limiting alcohol use.  Practicing safe sex.  Taking low-dose aspirin every day.  Taking vitamin and mineral supplements as recommended by your health care provider. What happens during an annual well check? The services and screenings done by your health care provider during your annual well check will depend on your age, overall  health, lifestyle risk factors, and family history of disease. Counseling  Your health care provider may ask you questions about your:  Alcohol use.  Tobacco use.  Drug use.  Emotional well-being.  Home and relationship well-being.  Sexual activity.  Eating habits.  History of falls.  Memory and ability to understand (cognition).  Work and work Statistician.  Reproductive health. Screening  You may have the following tests or measurements:  Height, weight, and BMI.  Blood pressure.  Lipid and cholesterol levels. These may be checked every 5 years, or more frequently if you are over 42 years old.  Skin check.  Lung cancer screening. You may have this screening every year starting at age 49 if you have a 30-pack-year history of smoking and currently smoke or have quit within the past 15 years.  Fecal occult blood test (FOBT) of the stool. You may have this test every year starting at age 33.  Flexible sigmoidoscopy or colonoscopy. You may have a sigmoidoscopy every 5 years or a colonoscopy every 10 years starting at age 61.  Hepatitis C blood test.  Hepatitis B blood test.  Sexually transmitted disease (STD) testing.  Diabetes screening. This is done by checking your blood sugar (glucose) after you have not eaten for a while (fasting). You may have this done every 1-3 years.  Bone density scan. This is done to screen for osteoporosis. You may have this done starting at age 24.  Mammogram. This may be done every 1-2 years. Talk to your health care provider about how often you should have regular mammograms. Talk with your health care provider about your test results, treatment options,  and if necessary, the need for more tests. Vaccines  Your health care provider may recommend certain vaccines, such as:  Influenza vaccine. This is recommended every year.  Tetanus, diphtheria, and acellular pertussis (Tdap, Td) vaccine. You may need a Td booster every 10  years.  Zoster vaccine. You may need this after age 14.  Pneumococcal 13-valent conjugate (PCV13) vaccine. One dose is recommended after age 1.  Pneumococcal polysaccharide (PPSV23) vaccine. One dose is recommended after age 46. Talk to your health care provider about which screenings and vaccines you need and how often you need them. This information is not intended to replace advice given to you by your health care provider. Make sure you discuss any questions you have with your health care provider. Document Released: 01/27/2015 Document Revised: 09/20/2015 Document Reviewed: 11/01/2014 Elsevier Interactive Patient Education  2017 [place] Prevention in the Home Falls can cause injuries. They can happen to people of all ages. There are many things you can do to make your home safe and to help prevent falls. What can I do on the outside of my home?  Regularly fix the edges of walkways and driveways and fix any cracks.  Remove anything that might make you trip as you walk through a door, such as a raised step or threshold.  Trim any bushes or trees on the path to your home.  Use bright outdoor lighting.  Clear any walking paths of anything that might make someone trip, such as rocks or tools.  Regularly check to see if handrails are loose or broken. Make sure that both sides of any steps have handrails.  Any raised decks and porches should have guardrails on the edges.  Have any leaves, snow, or ice cleared regularly.  Use sand or salt on walking paths during winter.  Clean up any spills in your garage right away. This includes oil or grease spills. What can I do in the bathroom?  Use night lights.  Install grab bars by the toilet and in the tub and shower. Do not use towel bars as grab bars.  Use non-skid mats or decals in the tub or shower.  If you need to sit down in the shower, use a plastic, non-slip stool.  Keep the floor dry. Clean up any water that  spills on the floor as soon as it happens.  Remove soap buildup in the tub or shower regularly.  Attach bath mats securely with double-sided non-slip rug tape.  Do not have throw rugs and other things on the floor that can make you trip. What can I do in the bedroom?  Use night lights.  Make sure that you have a light by your bed that is easy to reach.  Do not use any sheets or blankets that are too big for your bed. They should not hang down onto the floor.  Have a firm chair that has side arms. You can use this for support while you get dressed.  Do not have throw rugs and other things on the floor that can make you trip. What can I do in the kitchen?  Clean up any spills right away.  Avoid walking on wet floors.  Keep items that you use a lot in easy-to-reach places.  If you need to reach something above you, use a strong step stool that has a grab bar.  Keep electrical cords out of the way.  Do not use floor polish or wax that makes floors slippery. If  you must use wax, use non-skid floor wax.  Do not have throw rugs and other things on the floor that can make you trip. What can I do with my stairs?  Do not leave any items on the stairs.  Make sure that there are handrails on both sides of the stairs and use them. Fix handrails that are broken or loose. Make sure that handrails are as long as the stairways.  Check any carpeting to make sure that it is firmly attached to the stairs. Fix any carpet that is loose or worn.  Avoid having throw rugs at the top or bottom of the stairs. If you do have throw rugs, attach them to the floor with carpet tape.  Make sure that you have a light switch at the top of the stairs and the bottom of the stairs. If you do not have them, ask someone to add them for you. What else can I do to help prevent falls?  Wear shoes that:  Do not have high heels.  Have rubber bottoms.  Are comfortable and fit you well.  Are closed at the  toe. Do not wear sandals.  If you use a stepladder:  Make sure that it is fully opened. Do not climb a closed stepladder.  Make sure that both sides of the stepladder are locked into place.  Ask someone to hold it for you, if possible.  Clearly mark and make sure that you can see:  Any grab bars or handrails.  First and last steps.  Where the edge of each step is.  Use tools that help you move around (mobility aids) if they are needed. These include:  Canes.  Walkers.  Scooters.  Crutches.  Turn on the lights when you go into a dark area. Replace any light bulbs as soon as they burn out.  Set up your furniture so you have a clear path. Avoid moving your furniture around.  If any of your floors are uneven, fix them.  If there are any pets around you, be aware of where they are.  Review your medicines with your doctor. Some medicines can make you feel dizzy. This can increase your chance of falling. Ask your doctor what other things that you can do to help prevent falls. This information is not intended to replace advice given to you by your health care provider. Make sure you discuss any questions you have with your health care provider. Document Released: 10/27/2008 Document Revised: 06/08/2015 Document Reviewed: 02/04/2014 Elsevier Interactive Patient Education  2017 [employer].

## 2019-05-06 NOTE — Progress Notes (Signed)
    This service is provided via telemedicine  No vital signs collected/recorded due to the encounter was a telemedicine visit.   Location of patient (ex: home, work): Home.  Patient consents to a telephone visit: Yes.  Location of the provider (ex: office, home):  [place].   Name of any referring provider: N/A  Names of all persons participating in the telemedicine service and their role in the encounter: Patient, Regina Porter, RMA, Jessica Eubanks, NP.    Time spent on call: 8 minutes spent on the phone with Medical Assistant.   

## 2019-05-06 NOTE — Telephone Encounter (Signed)
1st attempt to call patient and no answer. Voicemail was left with office call back number. Patient made aware that there will be 2 more attempts to reach out. Patient also made aware in voicemail that 3rd attempt will lead to rescheduling appointment.   

## 2019-05-20 ENCOUNTER — Other Ambulatory Visit: Payer: Self-pay | Admitting: Nurse Practitioner

## 2019-05-21 ENCOUNTER — Other Ambulatory Visit: Payer: Self-pay | Admitting: Nurse Practitioner

## 2019-05-27 ENCOUNTER — Telehealth: Payer: Self-pay

## 2019-05-27 NOTE — Telephone Encounter (Signed)
I called and left message in voice mail of nurse for [employer].  I reminded her I sent medical clearance form for surgery back in Feb but was told patient needed to see cardiologist first. I saw where she had seen cardiologist and refaxed the form with a note requesting clearance once again on 04/21/19. I still have not received the clearance form for the patient.  I see that Janett Billow, NP saw patient yesterday for wellness check. Asked her did I need to fax form again or could they fax note stating cleared for surgery.

## 2019-05-28 ENCOUNTER — Telehealth: Payer: Self-pay

## 2019-05-28 NOTE — Telephone Encounter (Signed)
Incoming call sent to voicemail on 05/27/2019 @ 2:59 pm (I was out of office working remote)  Juliann Pulse with [facility] called stating she originally sent a fax in February for surgical clearance. It was recommended that patient see cardiology.  Patient seen cardiology and another surgical clearance was sent on 04/21/2019. [facility] is requesting a status update on surgical clearance.   Please return call to [facility] at [phone]   I reviewed chart and under media is a pre-op clearance form scanned noting patient is medical cleared. This was faxed to [facility] on 04/22/2019.  Per verbal conversation with Janett Billow GYN needs to send a clearance form to patients cardiology for it is not clearly documented that patient is cleared in Dr.Varanasi's note.   I left detailed message for Juliann Pulse with Jessica's response.

## 2019-06-01 ENCOUNTER — Other Ambulatory Visit: Payer: Self-pay | Admitting: Nurse Practitioner

## 2019-06-01 ENCOUNTER — Telehealth: Payer: Self-pay | Admitting: *Deleted

## 2019-06-01 DIAGNOSIS — Z1231 Encounter for screening mammogram for malignant neoplasm of breast: Secondary | ICD-10-CM

## 2019-06-01 NOTE — Telephone Encounter (Signed)
   [facility] Pre-operative Risk Assessment    HEARTCARE STAFF: - Please ensure there is not already an duplicate clearance open for this procedure - Under Visit Info/Reason for Call, type in Other and utilize the format Clearance MM/DD/YY or Clearance TBD  Request for surgical clearance:  1. What type of surgery is being performed?  HYSTEROSCOPY & D & C   2. When is this surgery scheduled?  TBD  3. What type of clearance is required (medical clearance vs. Pharmacy clearance to hold med vs. Both)?  MEDICAL  4. Are there any medications that need to be held prior to surgery and how long? N/A   5. Practice name and name of physician performing surgery? [facility] / DR. Dellis Filbert   6. What is the office phone number?  [phone]   7.   What is the office fax number?  [phone]  8.   Anesthesia type (None, local, MAC, general) ?  CHOICE    Jeanann Lewandowsky 06/01/2019, 10:39 AM  _________________________________________________________________   (provider comments below)

## 2019-06-01 NOTE — Telephone Encounter (Signed)
Sharae (sp?)  At [facility] called me back. She recommended I contact Dr. Irish Lack, the cardiologist who they referred patient to, in my attempt to obtain medical clearance.    I contacted Dr. Ernestene Kiel office for fax # and pre op clearance form was faxed.

## 2019-06-01 NOTE — Telephone Encounter (Signed)
   Primary Cardiologist: Larae Grooms, MD  Chart reviewed as part of pre-operative protocol coverage. Patient was contacted 06/01/2019 in reference to pre-operative risk assessment for pending surgery as outlined below.  Regina Porter was last seen on 03/16/19 by Dr. Irish Lack.  Since that day, Leola L Silvestri has done well.   Therefore, based on ACC/AHA guidelines, the patient would be at acceptable risk for the planned procedure without further cardiovascular testing.   I will route this recommendation to the requesting party via Epic fax function and remove from pre-op pool.  Please call with questions.  [place], [place] 06/01/2019, 12:55 PM

## 2019-06-16 ENCOUNTER — Telehealth: Payer: Self-pay

## 2019-06-16 NOTE — Telephone Encounter (Signed)
I called patient and spoke with daughter, Regina Porter.  I told her that I now have medical clearance from the cardiologist for surgery for her Mom.  She said that her mom had decided she was not going to have surgery. She said the bleeding was lighter. I asked her to explain to her that the bleeding is likely from the polyp.  It can be easily removed with the outpatient surgery.  No way to assure it is benign without removing it.  I told her I have time on 07/14/19 and advised about Covid testing/quarantine protocol preop.  She is going to talk to her Mom when she is home with her and will discuss all of this and see what her mom decides. She will call be back and let me know. I will hold the surgery date for her.

## 2019-07-12 ENCOUNTER — Telehealth: Payer: Self-pay

## 2019-07-12 NOTE — Telephone Encounter (Signed)
I called because I wanted to check and see if daughter Latanya Maudlin had spoken with the patient about surgery.  The voice mail box is full and I cannot leave a message.

## 2019-07-16 ENCOUNTER — Other Ambulatory Visit: Payer: Self-pay | Admitting: Nurse Practitioner

## 2019-07-16 DIAGNOSIS — I10 Essential (primary) hypertension: Secondary | ICD-10-CM

## 2019-07-20 NOTE — Telephone Encounter (Signed)
I called again but the voice mailbox is full and I cannot leave a message.

## 2019-07-20 NOTE — Telephone Encounter (Signed)
Certified letter mailed asking patient to let me know her thoughts about scheduling surgery.

## 2019-07-30 ENCOUNTER — Other Ambulatory Visit: Payer: Self-pay | Admitting: Nurse Practitioner

## 2019-07-30 DIAGNOSIS — F419 Anxiety disorder, unspecified: Secondary | ICD-10-CM

## 2019-07-30 NOTE — Telephone Encounter (Signed)
Receipt from certified letter received.

## 2019-07-30 NOTE — Telephone Encounter (Signed)
Hydralazine came up with warning when I tried to fill medication.

## 2019-08-01 ENCOUNTER — Other Ambulatory Visit: Payer: Self-pay | Admitting: Nurse Practitioner

## 2019-08-01 DIAGNOSIS — G3184 Mild cognitive impairment, so stated: Secondary | ICD-10-CM

## 2019-08-02 ENCOUNTER — Telehealth: Payer: Self-pay

## 2019-08-02 NOTE — Telephone Encounter (Signed)
Patient called stating she is ready to schedule D&C, Hysteroscopy.  We discussed upcoming available dates and she wrote them down and wants to discuss with daughter and call me back.  She said that she is concerned about her bladder leaking at night when she lies down in the bed.  It does not bother her much in the day time but has become an issue at night.  She said it started around the time she was here back in January. No UTI sx at all.  Does experience urinary frequency at night.  She went onto say she would rather see someone for this before she does the surgery.  Should we refer her to urology?  She was talking about scheduling an appointment with you.

## 2019-08-04 NOTE — Telephone Encounter (Signed)
Left message in voice mail for patient to call back.

## 2019-08-04 NOTE — Telephone Encounter (Signed)
Agree with appointment with me for problem and preop.  Recommend Depens for night incontinence.

## 2019-08-05 NOTE — Telephone Encounter (Signed)
Patient called back. We discussed that Dr. Dellis Filbert wrote she would see her for the problem at her pre op visit.  Patient has talked with daughter and agreed on 08/23/19 for surgery. I scheduled her at 11:30am to follow at [facility].  I will call her back this afternoon, as our system was down at the time, and schedule pre op visit and Covid test.

## 2019-08-09 ENCOUNTER — Encounter: Payer: Self-pay | Admitting: Nurse Practitioner

## 2019-08-09 ENCOUNTER — Telehealth (INDEPENDENT_AMBULATORY_CARE_PROVIDER_SITE_OTHER): Payer: Medicare Other | Admitting: Nurse Practitioner

## 2019-08-09 ENCOUNTER — Other Ambulatory Visit: Payer: Self-pay

## 2019-08-09 ENCOUNTER — Telehealth: Payer: Self-pay

## 2019-08-09 DIAGNOSIS — G933 Postviral fatigue syndrome: Secondary | ICD-10-CM | POA: Diagnosis not present

## 2019-08-09 DIAGNOSIS — G9331 Postviral fatigue syndrome: Secondary | ICD-10-CM

## 2019-08-09 NOTE — Telephone Encounter (Signed)
[name], ignasiak are scheduled for a virtual visit with your provider today.    Just as we do with appointments in the office, we must obtain your consent to participate.  Your consent will be active for this visit and any virtual visit you may have with one of our providers in the next 365 days.    If you have a MyChart account, I can also send a copy of this consent to you electronically.  All virtual visits are billed to your insurance company just like a traditional visit in the office.  As this is a virtual visit, video technology does not allow for your provider to perform a traditional examination.  This may limit your provider's ability to fully assess your condition.  If your provider identifies any concerns that need to be evaluated in person or the need to arrange testing such as labs, EKG, etc, we will make arrangements to do so.    Although advances in technology are sophisticated, we cannot ensure that it will always work on either your end or our end.  If the connection with a video visit is poor, we may have to switch to a telephone visit.  With either a video or telephone visit, we are not always able to ensure that we have a secure connection.   I need to obtain your verbal consent now.   Are you willing to proceed with your visit today?   Regina Porter has provided verbal consent on 08/09/2019 for a virtual visit (video or telephone).   [place], [place] 08/09/2019  1:07 PM

## 2019-08-09 NOTE — Progress Notes (Signed)
This service is provided via telemedicine  No vital signs collected/recorded due to the encounter was a telemedicine visit.   Location of patient (ex: home, work):Home.  Patient consents to a telephone visit: Yes.  Location of the provider (ex: office, home):  [facility].  Name of any referring provider: N/A  Names of all persons participating in the telemedicine service and their role in the encounter: Patient, La'sha Daughter, Heriberto Antigua, RMA, Sherrie Mustache, NP.    Time spent on call: 8 minutes spent on the phone with Medical Assistant.       Careteam: Patient Care Team: [name], NP as PCP - General (Geriatric Medicine) Jettie Booze, MD as PCP - Cardiology (Cardiology) Dustin Folks, MD as Consulting Physician (Hematology and Oncology) Esperanza Heir, MD as Consulting Physician (Obstetrics and Gynecology) Consuella Lose, MD as Consulting Physician (Neurosurgery) Deterding, Jeneen Rinks, MD as Consulting Physician (Nephrology)  PLACE OF SERVICE:  [facility] Directive information Does Patient Have a Medical Advance Directive?: No, Would patient like information on creating a medical advance directive?: No - Patient declined  Allergies  Allergen Reactions  . Shellfish Allergy Swelling and Rash    MOUTH  . Neomy-Bacit-Polymyx-Pramoxine     UNSPECIFIED REACTION  Not sure which antibiotic she has a reaction to    Chief Complaint  Patient presents with  . Acute Visit    Complains of Body Aches, Chills, Fatigue/Weakness, and Low Grade Fever.     HPI: Patient is a 85 y.o. female being seen via video visit.  Pt reports not feeling well. She reports low grade fever but has had taking temperature. Having chills. Forehead is cool per daughter.  Body aches, fatigue and weakness. Reports this has been ongoing for 2 weeks.  Feels like overall she is getting better.  She is in the bed during the video visit.  She  reports she does get out of bed but not eating well.  No nausea/vomiting or diarrhea.  Reports low appetite.  No cough or congestion. Daughter reports she gets really short of breath with going to the bathroom.   No swelling. No chest pains. No palpitations.  Reports she had a burning with urination but that has resolved. (this was before theses symptoms) Has not checked vital signs today No wounds or sores. No sick contacts however daughter went out of town and came back ~3 weeks ago, had "the sniffles"  Has had COVID vaccine.   Daughter is most concerned with shortness of breath with activity and lack of energy- can not do much of anything without sitting down.   147/58 pulse 69 Review of Systems:  Review of Systems  Constitutional: Positive for chills and malaise/fatigue. Negative for fever and weight loss.  HENT: Negative for tinnitus.   Respiratory: Negative for cough, sputum production and shortness of breath.   Cardiovascular: Negative for chest pain, palpitations and leg swelling.  Gastrointestinal: Negative for abdominal pain, constipation, diarrhea and heartburn.  Genitourinary: Negative for dysuria, frequency and urgency.  Musculoskeletal: Negative for back pain, falls, joint pain and myalgias.  Skin: Negative.   Neurological: Positive for weakness. Negative for dizziness and headaches.  Psychiatric/Behavioral: Negative for depression and memory loss. The patient does not have insomnia.     Past Medical History:  Diagnosis Date  . Allergy    mild   . Anemia   . Anterolisthesis    grade 1: L3-4  . Anxiety   . Arthritis  pt states no pain   . Breast cancer ([place])    left   . Chronic kidney disease   . Dementia ([place])   . Depression   . Dyspnea    W/ PAIN   . Gastroesophageal reflux disease   . Headache    TO SEE NEURO MD  . Heart murmur    mild per pt.  . Heartburn   . Hypertension   . Hypothyroidism   . Leg cramps   . Post-menopause bleeding   .  Restless leg syndrome 10/07/2014   Past Surgical History:  Procedure Laterality Date  . back sugery   2019   lower back   . BREAST LUMPECTOMY    . BREAST SURGERY Left    Lumpectomy   . CATARACT EXTRACTION, BILATERAL    . hysteroscopy biopsy     Social History:   reports that she quit smoking about 13 years ago. She has a 2.00 pack-year smoking history. She has never used smokeless tobacco. She reports that she does not drink alcohol and does not use drugs.  Family History  Problem Relation Age of Onset  . Hypertension Mother   . Arthritis Mother   . Heart disease Mother   . Hypertension Father   . Heart disease Father   . Cancer Brother   . Breast cancer Paternal Aunt   . Colon cancer Neg Hx   . Colon polyps Neg Hx   . Esophageal cancer Neg Hx   . Rectal cancer Neg Hx   . Stomach cancer Neg Hx     Medications: Patient's Medications  New Prescriptions   No medications on file  Previous Medications   ACETAMINOPHEN (TYLENOL) 500 MG TABLET    Take 500 mg by mouth daily as needed.   AMLODIPINE (NORVASC) 10 MG TABLET    TAKE 1 TABLET BY MOUTH EVERY DAY   ASPIRIN 81 MG EC TABLET    Take by mouth.   BUMETANIDE (BUMEX) 2 MG TABLET    Take 2 mg by mouth daily.   BUSPIRONE (BUSPAR) 10 MG TABLET    TAKE 1 TABLET BY MOUTH TWICE DAILY .DX F41.9 APPT IS OVERDUE   CARBOXYMETHYLCELLULOSE SODIUM (EYE DROPS OP)    Apply to eye as needed.   COD LIVER OIL 1000 MG CAPS    Take 1 capsule by mouth 2 (two) times daily.    DONEPEZIL (ARICEPT) 5 MG TABLET    TAKE 1 TABLET BY MOUTH EVERYDAY AT BEDTIME DX: G31.84   HYDRALAZINE (APRESOLINE) 100 MG TABLET    TAKE 1 TABLET (100 MG TOTAL) BY MOUTH 3 (THREE) TIMES DAILY.   LEVOTHYROXINE (SYNTHROID) 75 MCG TABLET    TAKE ONE TABLET BY MOUTH ONCE DAILY 30 MINUTES BEFORE BREAKFAST FOR THYROID   LOSARTAN (COZAAR) 100 MG TABLET    TAKE 1 TABLET BY MOUTH EVERYDAY AT BEDTIME   METOPROLOL SUCCINATE (TOPROL-XL) 100 MG 24 HR TABLET    TAKE 1 TABLET BY MOUTH  EVERYDAY AT BEDTIME   TRAZODONE (DESYREL) 50 MG TABLET    TAKE 1 TABLET BY MOUTH EVERYDAY AT BEDTIME  Modified Medications   No medications on file  Discontinued Medications   No medications on file    Physical Exam:  There were no vitals filed for this visit. There is no height or weight on file to calculate BMI. Wt Readings from Last 3 Encounters:  03/16/19 199 lb 12.8 oz (90.6 kg)  03/03/19 197 lb 6.4 oz (89.5 kg)  02/03/19 198 lb (  89.8 kg)      Labs reviewed: Basic Metabolic Panel: No results for input(s): NA, K, CL, CO2, GLUCOSE, BUN, CREATININE, CALCIUM, MG, PHOS, TSH in the last 8760 hours. Liver Function Tests: No results for input(s): AST, ALT, ALKPHOS, BILITOT, PROT, ALBUMIN in the last 8760 hours. No results for input(s): LIPASE, AMYLASE in the last 8760 hours. No results for input(s): AMMONIA in the last 8760 hours. CBC: Recent Labs    08/18/18 0932 09/24/18 1031 03/10/19 1104  WBC 4.1 4.2 4.1  NEUTROABS  --  1.7  --   HGB 10.6* 12.0 11.1*  HCT 31.3* 35.2* 32.8*  MCV 92.9 92.6 92.9  PLT 228 261.0 278   Lipid Panel: No results for input(s): CHOL, HDL, LDLCALC, TRIG, CHOLHDL, LDLDIRECT in the last 8760 hours. TSH: No results for input(s): TSH in the last 8760 hours. A1C: No results found for: HGBA1C   Assessment/Plan 1. Postviral fatigue syndrome Reports ongoing symptoms for ~2 week but overall feels like they are improving. Encouraged to get OOB, with deep breathing. Can get pulse Ox at drug store to check oxygen level. Discussed with pt and daughter that if she does not continue to improve or shortness of breath worsens that she needs to schedule an in office visit for evaluation and lab work. Encouraged to slowly increase physical activity, to make sure she was staying hydrated and getting proper amount of nutrition.   Regina Porter. Harle Battiest  [facility] [phone]   Virtual Visit via Video Note  I connected with  Kyleah Evalina Field on 08/12/19 at  1:00 PM EDT by a video enabled telemedicine application and verified that I am speaking with the correct person using two identifiers.  Location: Patient: home Provider: office   I discussed the limitations of evaluation and management by telemedicine and the availability of in person appointments. The patient expressed understanding and agreed to proceed.    I discussed the assessment and treatment plan with the patient. The patient was provided an opportunity to ask questions and all were answered. The patient agreed with the plan and demonstrated an understanding of the instructions.   The patient was advised to call back or seek an in-person evaluation if the symptoms worsen or if the condition fails to improve as anticipated.  I provided 15 minutes of non-face-to-face time during this encounter.  Regina Porter. Dewaine Oats, AGNP Avs printed and mailed.

## 2019-08-09 NOTE — Telephone Encounter (Signed)
Left message to confirm date. Asked her to call me to schedule the other appts we discussed.

## 2019-08-10 NOTE — Telephone Encounter (Signed)
Patient's daughter Latanya Maudlin called me. We confirmed all dates were good for her to bring patient.  Covid test was scheduled. I advised her regarding quarantine protocol after testing.  Packet will be sent.

## 2019-08-10 NOTE — Telephone Encounter (Signed)
I called patient again 08/09/19 and spoke with her. She was a bit overwhelmed with discussion of dates and wanted me to speak with her daughter. Her daughter was not there but I left my direct phone number for her to call me when she returned.

## 2019-08-12 ENCOUNTER — Other Ambulatory Visit: Payer: Medicare Other

## 2019-08-13 ENCOUNTER — Telehealth: Payer: Self-pay

## 2019-08-13 ENCOUNTER — Encounter (HOSPITAL_BASED_OUTPATIENT_CLINIC_OR_DEPARTMENT_OTHER): Payer: Self-pay | Admitting: Obstetrics & Gynecology

## 2019-08-13 NOTE — Telephone Encounter (Signed)
Stop BB ASA now until surgery 08/23/2019.

## 2019-08-13 NOTE — Telephone Encounter (Signed)
Spoke with nurse Langley Gauss and let her know. She said they will call patient and instruct her to stop now.

## 2019-08-13 NOTE — Telephone Encounter (Signed)
[facility] pre op nurse called. Needs recommendation what to tell patient in regards to 81 mg ASA she takes daily. Please advise.

## 2019-08-13 NOTE — Telephone Encounter (Signed)
Opened in error

## 2019-08-13 NOTE — Progress Notes (Signed)
Left voice mail message for patient daughter la-sha to have patient stop 81 mg aspirin per dr Dellis Filbert and to call [phone] on 08-16-2019 for medical history and up date medications and instructions for surgery

## 2019-08-15 ENCOUNTER — Other Ambulatory Visit: Payer: Self-pay | Admitting: Internal Medicine

## 2019-08-16 ENCOUNTER — Other Ambulatory Visit: Payer: Self-pay | Admitting: Nurse Practitioner

## 2019-08-18 ENCOUNTER — Other Ambulatory Visit: Payer: Self-pay

## 2019-08-18 ENCOUNTER — Encounter: Payer: Self-pay | Admitting: Obstetrics & Gynecology

## 2019-08-18 ENCOUNTER — Telehealth: Payer: Self-pay | Admitting: *Deleted

## 2019-08-18 ENCOUNTER — Ambulatory Visit (INDEPENDENT_AMBULATORY_CARE_PROVIDER_SITE_OTHER): Payer: Medicare Other | Admitting: Obstetrics & Gynecology

## 2019-08-18 VITALS — BP 136/70

## 2019-08-18 DIAGNOSIS — Z853 Personal history of malignant neoplasm of breast: Secondary | ICD-10-CM

## 2019-08-18 DIAGNOSIS — N840 Polyp of corpus uteri: Secondary | ICD-10-CM

## 2019-08-18 DIAGNOSIS — N950 Postmenopausal bleeding: Secondary | ICD-10-CM

## 2019-08-18 NOTE — Progress Notes (Signed)
    Regina Porter 03-17-1934 [id number]        85 y.o.  G5P5L5.  Accompanied by her daughter   RP: Preop HSC/Myosure Excisions/D+C on 08/23/2019  HPI: PMB with a thickened Endometrial line/Probable Polyps on Pelvic US 02/16/2019.  History of breast cancer with postmenopausal bleeding while on tamoxifen and persisting since she stopped 1 1/2 yr ago. Mild pelvic cramping.  No UTI Sx.  BMs normal. CHTN controled on Meds.  Hypothyroidism on Synthroid.    OB History  Gravida Para Term Preterm AB Living  5 5       5   SAB TAB Ectopic Multiple Live Births               # Outcome Date GA Lbr Len/2nd Weight Sex Delivery Anes PTL Lv  5 Para           4 Para           3 Para           2 Para           1 Para             Past medical history,surgical history, problem list, medications, allergies, family history and social history were all reviewed and documented in the EPIC chart.   Directed ROS with pertinent positives and negatives documented in the history of present illness/assessment and plan.  Exam:  Vitals:   08/18/19 1231  BP: 136/70   General appearance:  Normal  Pelvic US 02/16/2019: T/V images. Anteverted uterus with inhomogenous myometrium with at least 2 focal intramural fibroids,measured at 3.15 and 2.03 cm. The uterus is measured at 9.58 x 7.07 x 5.8 cm. Thickened endometrial lining with irregular margins measuring 17.17 mm. Feeder vessels noted leading to areas of thickening. Bilateral ovaries small with atrophic appearance. No adnexal mass. No free fluid in the posterior cul-de-sac.   Assessment/Plan:  85 y.o. G5P5   1. Postmenopausal bleeding PMB during Tamoxifen treatment and after.  Pelvic US 02/16/2019 showed a thickened Endometrial line at 17.17 mm with feeder vessels c/w Endometrial Polyps.  No CI to HSC/Myosure Excision/D+C.  Surgery scheduled on 08/23/2019. Surgical procedure, risks/benefits thoroughly reviewed with patient.  2. Endometrial polyp As above.  3.  History of left breast cancer Stopped Tamoxifen x 1 1/2 yr.                        Patient was counseled as to the risk of surgery to include the following:  1. Infection (prohylactic antibiotics will be administered)  2. DVT/Pulmonary Embolism (prophylactic pneumo compression stockings will be used)  3.Trauma to internal organs requiring additional surgical procedure to repair any injury to internal organs requiring perhaps additional hospitalization days.  4.Hemmorhage requiring transfusion and blood products which carry risks such as anaphylactic reaction, hepatitis and AIDS  Patient had received literature information on the procedure scheduled and all her questions were answered and fully accepts all risk.    [name] MD, 12:58 PM 08/18/2019

## 2019-08-18 NOTE — Telephone Encounter (Signed)
I called [facility] pre-op scheduling to inquire about labs prior surgery  And was advised cannot get the patient or daughter to return the call.  The patient has pre-op today at our office and ML requests a CBC and EKG preop.  It can be done at preop apt in person or morning of. Per ML do not cancel surgery if cannot come before Monday.  That was advised to Preop scheduler at [phone].  I left a message with patient and with patients daughter per DPR.  KW CMA/ML

## 2019-08-19 ENCOUNTER — Other Ambulatory Visit (HOSPITAL_COMMUNITY): Payer: Medicare Other

## 2019-08-19 ENCOUNTER — Other Ambulatory Visit (HOSPITAL_COMMUNITY)
Admission: RE | Admit: 2019-08-19 | Discharge: 2019-08-19 | Disposition: A | Discharge status: Home / Self Care | Payer: Medicare Other | Source: Ambulatory Visit | Attending: Obstetrics & Gynecology | Admitting: Obstetrics & Gynecology

## 2019-08-19 ENCOUNTER — Other Ambulatory Visit: Payer: Self-pay

## 2019-08-19 ENCOUNTER — Encounter (HOSPITAL_BASED_OUTPATIENT_CLINIC_OR_DEPARTMENT_OTHER): Payer: Self-pay | Admitting: Obstetrics & Gynecology

## 2019-08-19 ENCOUNTER — Other Ambulatory Visit: Payer: Self-pay | Admitting: Internal Medicine

## 2019-08-19 DIAGNOSIS — Z01812 Encounter for preprocedural laboratory examination: Secondary | ICD-10-CM | POA: Diagnosis not present

## 2019-08-19 DIAGNOSIS — Z20822 Contact with and (suspected) exposure to covid-19: Secondary | ICD-10-CM | POA: Insufficient documentation

## 2019-08-19 LAB — SARS CORONAVIRUS 2 (TAT 6-24 HRS): SARS Coronavirus 2: NEGATIVE

## 2019-08-19 NOTE — Progress Notes (Addendum)
Addendum: spoke with jessica zanetto pa meets [facility] guidelines  Spoke w/ via phone for pre-op interview---PT daughter symphanie cederberg per pt request Lab needs dos----       I stat 8, cbc        COVID test ------08-19-2019 1035 Arrive at -------945 am 08-23-2019 NPO after MN NO Solid Food.  Clear liquids from MN until---845 am then npo Medications to take morning of surgery -----amlodipine, hydrazaline, buspirone, levothyroxine Diabetic medication -----n/a Patient Special Instructions ----bring cpap mask tubing and machine and leave in car Pre-Op special Istructions -----none Patient verbalized understanding of instructions that were given at this phone interview. Patient denies shortness of breath, chest pain, fever, cough at this phone interview.  Anesthesia : hx htn , smll heart murmur abnormal ekg, osa with cpap dementia, ckd, left breast cancer 2013, pt has no cardiac S & S or sob at pre op call pre daughter  Chart to [place] zanetto pa for review  PCP:  Sherrie Mustache np medical clearance 04-21-2019 on chart Cardiologist : dr Irish Lack cardiac clearance bhavinkumar bhagat 06-01-2019 epic and on chart Chest x-ray : [id number] epic EKG :03-03-2019 epic Echo :04-27-16 epic Vas US carotid 05-10-2016 epic Stress test:none Cardiac Cath : none lov nephrology dr deterding 08-03-2018 on chart Sleep study 10-11-2018 epic,  pt daughter does not know settings Activity level: can climb stairs with some rest needed half way up, daughter getting chair lift pt cleans own room and does ald's independently Sleep Study/ CPAP : uses cpap nightly Fasting Blood Sugar :      / Checks Blood Sugar -- times a day:  n/a Blood Thinner/ Instructions /Last Dose:n/a ASA / Instructions/ Last Dose : stopped 81 mg aspirin 08-10-2019 per dr Dellis Filbert instructions

## 2019-08-20 ENCOUNTER — Telehealth: Payer: Self-pay | Admitting: *Deleted

## 2019-08-20 ENCOUNTER — Encounter (HOSPITAL_BASED_OUTPATIENT_CLINIC_OR_DEPARTMENT_OTHER): Payer: Self-pay | Admitting: Obstetrics & Gynecology

## 2019-08-20 NOTE — Telephone Encounter (Signed)
Regina Porter called back from [facility] stating she did get in contact with [name] daughter. Regina Porter said patient had EKG done on 03/03/19 it was abnormal,but has received medical clearance from cardiology and medical clearance from PCP. Patient is scheduled for surgery on 08/23/19 for D&C

## 2019-08-23 ENCOUNTER — Encounter (HOSPITAL_BASED_OUTPATIENT_CLINIC_OR_DEPARTMENT_OTHER): Payer: Self-pay | Admitting: Obstetrics & Gynecology

## 2019-08-23 ENCOUNTER — Other Ambulatory Visit: Payer: Self-pay | Admitting: Obstetrics & Gynecology

## 2019-08-23 ENCOUNTER — Ambulatory Visit (HOSPITAL_BASED_OUTPATIENT_CLINIC_OR_DEPARTMENT_OTHER): Payer: Medicare Other | Admitting: Physician Assistant

## 2019-08-23 ENCOUNTER — Ambulatory Visit (HOSPITAL_BASED_OUTPATIENT_CLINIC_OR_DEPARTMENT_OTHER)
Admission: RE | Admit: 2019-08-23 | Discharge: 2019-08-23 | Disposition: A | Discharge status: Home / Self Care | Payer: Medicare Other | Source: Ambulatory Visit | Attending: Obstetrics & Gynecology | Admitting: Obstetrics & Gynecology

## 2019-08-23 ENCOUNTER — Encounter (HOSPITAL_BASED_OUTPATIENT_CLINIC_OR_DEPARTMENT_OTHER)
Admission: RE | Disposition: A | Discharge status: Home / Self Care | Payer: Self-pay | Source: Ambulatory Visit | Attending: Obstetrics & Gynecology

## 2019-08-23 DIAGNOSIS — Z7982 Long term (current) use of aspirin: Secondary | ICD-10-CM | POA: Insufficient documentation

## 2019-08-23 DIAGNOSIS — Z853 Personal history of malignant neoplasm of breast: Secondary | ICD-10-CM | POA: Insufficient documentation

## 2019-08-23 DIAGNOSIS — F0390 Unspecified dementia without behavioral disturbance: Secondary | ICD-10-CM | POA: Diagnosis not present

## 2019-08-23 DIAGNOSIS — Z87891 Personal history of nicotine dependence: Secondary | ICD-10-CM | POA: Insufficient documentation

## 2019-08-23 DIAGNOSIS — E039 Hypothyroidism, unspecified: Secondary | ICD-10-CM | POA: Diagnosis not present

## 2019-08-23 DIAGNOSIS — N950 Postmenopausal bleeding: Secondary | ICD-10-CM | POA: Diagnosis not present

## 2019-08-23 DIAGNOSIS — I129 Hypertensive chronic kidney disease with stage 1 through stage 4 chronic kidney disease, or unspecified chronic kidney disease: Secondary | ICD-10-CM | POA: Insufficient documentation

## 2019-08-23 DIAGNOSIS — Z79899 Other long term (current) drug therapy: Secondary | ICD-10-CM | POA: Insufficient documentation

## 2019-08-23 DIAGNOSIS — R102 Pelvic and perineal pain: Secondary | ICD-10-CM | POA: Diagnosis not present

## 2019-08-23 DIAGNOSIS — Z79890 Hormone replacement therapy (postmenopausal): Secondary | ICD-10-CM | POA: Diagnosis not present

## 2019-08-23 DIAGNOSIS — F419 Anxiety disorder, unspecified: Secondary | ICD-10-CM | POA: Insufficient documentation

## 2019-08-23 DIAGNOSIS — F329 Major depressive disorder, single episode, unspecified: Secondary | ICD-10-CM | POA: Insufficient documentation

## 2019-08-23 DIAGNOSIS — N184 Chronic kidney disease, stage 4 (severe): Secondary | ICD-10-CM | POA: Diagnosis not present

## 2019-08-23 DIAGNOSIS — C541 Malignant neoplasm of endometrium: Secondary | ICD-10-CM | POA: Insufficient documentation

## 2019-08-23 DIAGNOSIS — G4730 Sleep apnea, unspecified: Secondary | ICD-10-CM | POA: Diagnosis not present

## 2019-08-23 HISTORY — PX: DILATATION & CURETTAGE/HYSTEROSCOPY WITH MYOSURE: SHX6511

## 2019-08-23 HISTORY — DX: Sleep apnea, unspecified: G47.30

## 2019-08-23 LAB — POCT I-STAT, CHEM 8
BUN: 21 mg/dL (ref 8–23)
Calcium, Ion: 1.24 mmol/L (ref 1.15–1.40)
Chloride: 106 mmol/L (ref 98–111)
Creatinine, Ser: 2.1 mg/dL — ABNORMAL HIGH (ref 0.44–1.00)
Glucose, Bld: 95 mg/dL (ref 70–99)
HCT: 37 % (ref 36.0–46.0)
Hemoglobin: 12.6 g/dL (ref 12.0–15.0)
Potassium: 4.3 mmol/L (ref 3.5–5.1)
Sodium: 143 mmol/L (ref 135–145)
TCO2: 22 mmol/L (ref 22–32)

## 2019-08-23 LAB — CBC
HCT: 32.8 % — ABNORMAL LOW (ref 36.0–46.0)
Hemoglobin: 10.5 g/dL — ABNORMAL LOW (ref 12.0–15.0)
MCH: 29.6 pg (ref 26.0–34.0)
MCHC: 32 g/dL (ref 30.0–36.0)
MCV: 92.4 fL (ref 80.0–100.0)
Platelets: 439 10*3/uL — ABNORMAL HIGH (ref 150–400)
RBC: 3.55 MIL/uL — ABNORMAL LOW (ref 3.87–5.11)
RDW: 13.8 % (ref 11.5–15.5)
WBC: 6.2 10*3/uL (ref 4.0–10.5)
nRBC: 0 % (ref 0.0–0.2)

## 2019-08-23 SURGERY — DILATATION & CURETTAGE/HYSTEROSCOPY WITH MYOSURE
Anesthesia: General | Site: Vagina

## 2019-08-23 MED ORDER — LIDOCAINE HCL 1 % IJ SOLN
INTRAMUSCULAR | Status: DC | PRN
  Administered 2019-08-23: 10 mL

## 2019-08-23 MED ORDER — CEFAZOLIN SODIUM-DEXTROSE 2-4 GM/100ML-% IV SOLN
2.0000 g | INTRAVENOUS | Status: AC
  Administered 2019-08-23: 2 g via INTRAVENOUS

## 2019-08-23 MED ORDER — PROPOFOL 10 MG/ML IV BOLUS
INTRAVENOUS | Status: DC | PRN
  Administered 2019-08-23: 120 mg via INTRAVENOUS

## 2019-08-23 MED ORDER — FENTANYL CITRATE (PF) 100 MCG/2ML IJ SOLN: 25.0000 ug | INTRAMUSCULAR | Status: DC | PRN

## 2019-08-23 MED ORDER — LIDOCAINE 2% (20 MG/ML) 5 ML SYRINGE
INTRAMUSCULAR | Status: AC
  Filled 2019-08-23: qty 5

## 2019-08-23 MED ORDER — PHENYLEPHRINE 40 MCG/ML (10ML) SYRINGE FOR IV PUSH (FOR BLOOD PRESSURE SUPPORT)
PREFILLED_SYRINGE | INTRAVENOUS | Status: DC | PRN
  Administered 2019-08-23 (×2): 80 ug via INTRAVENOUS

## 2019-08-23 MED ORDER — EPHEDRINE 5 MG/ML INJ
INTRAVENOUS | Status: AC
  Filled 2019-08-23: qty 10

## 2019-08-23 MED ORDER — FENTANYL CITRATE (PF) 100 MCG/2ML IJ SOLN
INTRAMUSCULAR | Status: AC
  Filled 2019-08-23: qty 2

## 2019-08-23 MED ORDER — ACETAMINOPHEN 10 MG/ML IV SOLN: 1000.0000 mg | Freq: Once | INTRAVENOUS | Status: DC | PRN

## 2019-08-23 MED ORDER — CEFAZOLIN SODIUM-DEXTROSE 2-4 GM/100ML-% IV SOLN
INTRAVENOUS | Status: AC
  Filled 2019-08-23: qty 100

## 2019-08-23 MED ORDER — LIDOCAINE 2% (20 MG/ML) 5 ML SYRINGE
INTRAMUSCULAR | Status: DC | PRN
  Administered 2019-08-23: 60 mg via INTRAVENOUS

## 2019-08-23 MED ORDER — PROPOFOL 10 MG/ML IV BOLUS
INTRAVENOUS | Status: AC
  Filled 2019-08-23: qty 20

## 2019-08-23 MED ORDER — MEGESTROL ACETATE 40 MG PO TABS
40.0000 mg | ORAL_TABLET | Freq: Two times a day (BID) | ORAL | 1 refills | Status: DC
Start: 2019-08-23 — End: 2019-09-09

## 2019-08-23 MED ORDER — DEXAMETHASONE SODIUM PHOSPHATE 10 MG/ML IJ SOLN
INTRAMUSCULAR | Status: AC
  Filled 2019-08-23: qty 1

## 2019-08-23 MED ORDER — LACTATED RINGERS IV SOLN: INTRAVENOUS | Status: DC

## 2019-08-23 MED ORDER — FENTANYL CITRATE (PF) 100 MCG/2ML IJ SOLN
INTRAMUSCULAR | Status: DC | PRN
  Administered 2019-08-23: 50 ug via INTRAVENOUS

## 2019-08-23 MED ORDER — ACETAMINOPHEN 325 MG PO TABS: 325.0000 mg | ORAL_TABLET | Freq: Once | ORAL | Status: DC | PRN

## 2019-08-23 MED ORDER — ONDANSETRON HCL 4 MG/2ML IJ SOLN
INTRAMUSCULAR | Status: DC | PRN
  Administered 2019-08-23: 4 mg via INTRAVENOUS

## 2019-08-23 MED ORDER — PHENYLEPHRINE 40 MCG/ML (10ML) SYRINGE FOR IV PUSH (FOR BLOOD PRESSURE SUPPORT)
PREFILLED_SYRINGE | INTRAVENOUS | Status: AC
  Filled 2019-08-23: qty 10

## 2019-08-23 MED ORDER — SODIUM CHLORIDE 0.9 % IV SOLN: INTRAVENOUS | Status: DC

## 2019-08-23 MED ORDER — ONDANSETRON HCL 4 MG/2ML IJ SOLN
INTRAMUSCULAR | Status: AC
  Filled 2019-08-23: qty 2

## 2019-08-23 MED ORDER — EPHEDRINE SULFATE-NACL 50-0.9 MG/10ML-% IV SOSY
PREFILLED_SYRINGE | INTRAVENOUS | Status: DC | PRN
  Administered 2019-08-23 (×2): 10 mg via INTRAVENOUS

## 2019-08-23 MED ORDER — ACETAMINOPHEN 160 MG/5ML PO SOLN: 325.0000 mg | Freq: Once | ORAL | Status: DC | PRN

## 2019-08-23 MED ORDER — DEXAMETHASONE SODIUM PHOSPHATE 4 MG/ML IJ SOLN
INTRAMUSCULAR | Status: DC | PRN
  Administered 2019-08-23: 5 mg via INTRAVENOUS

## 2019-08-23 SURGICAL SUPPLY — 21 items
CATH ROBINSON RED A/P 16FR (CATHETERS) ×2 IMPLANT
COVER WAND RF STERILE (DRAPES) ×2 IMPLANT
DEVICE MYOSURE LITE (MISCELLANEOUS) IMPLANT
DEVICE MYOSURE REACH (MISCELLANEOUS) ×2 IMPLANT
DILATOR CANAL MILEX (MISCELLANEOUS) IMPLANT
ELECT REM PT RETURN 9FT ADLT (ELECTROSURGICAL)
ELECTRODE REM PT RTRN 9FT ADLT (ELECTROSURGICAL) IMPLANT
GAUZE 4X4 16PLY RFD (DISPOSABLE) ×2 IMPLANT
GLOVE BIO SURGEON STRL SZ 6.5 (GLOVE) ×2 IMPLANT
GLOVE BIOGEL PI IND STRL 7.0 (GLOVE) ×2 IMPLANT
GLOVE BIOGEL PI INDICATOR 7.0 (GLOVE) ×2
GOWN STRL REUS W/TWL LRG LVL3 (GOWN DISPOSABLE) ×4 IMPLANT
IV NS IRRIG 3000ML ARTHROMATIC (IV SOLUTION) ×2 IMPLANT
KIT PROCEDURE FLUENT (KITS) ×2 IMPLANT
MYOSURE XL FIBROID (MISCELLANEOUS)
PACK VAGINAL MINOR WOMEN LF (CUSTOM PROCEDURE TRAY) ×2 IMPLANT
PAD OB MATERNITY 4.3X12.25 (PERSONAL CARE ITEMS) ×2 IMPLANT
PAD PREP 24X48 CUFFED NSTRL (MISCELLANEOUS) ×2 IMPLANT
SEAL CERVICAL OMNI LOK (ABLATOR) IMPLANT
SEAL ROD LENS SCOPE MYOSURE (ABLATOR) ×2 IMPLANT
SYSTEM TISS REMOVAL MYOSURE XL (MISCELLANEOUS) IMPLANT

## 2019-08-23 NOTE — Transfer of Care (Signed)
Immediate Anesthesia Transfer of Care Note  Patient: Regina Porter  Procedure(s) Performed: DILATATION & CURETTAGE/HYSTEROSCOPY WITH MYOSURE (N/A Vagina )  Patient Location: PACU  Anesthesia Type:General  Level of Consciousness: awake  Airway & Oxygen Therapy: Patient Spontanous Breathing and Patient connected to face mask oxygen  Post-op Assessment: Report given to RN and Post -op Vital signs reviewed and stable  Post vital signs: Reviewed and stable  Last Vitals:  Vitals Value Taken Time  BP    Temp    Pulse    Resp 12 08/23/19 1210  SpO2    Vitals shown include unvalidated device data.  Last Pain:  Vitals:   08/23/19 0945  TempSrc: Oral  PainSc: 0-No pain         Complications: No complications documented.

## 2019-08-23 NOTE — Anesthesia Preprocedure Evaluation (Addendum)
Anesthesia Evaluation  Patient identified by MRN, date of birth, ID band Patient awake    Reviewed: Allergy & Precautions, NPO status , Patient's Chart, lab work & pertinent test results  Airway Mallampati: I  TM Distance: >3 FB Neck ROM: Full    Dental  (+) Edentulous Upper, Edentulous Lower   Pulmonary sleep apnea , former smoker,    breath sounds clear to auscultation       Cardiovascular hypertension,  Rhythm:Regular Rate:Normal     Neuro/Psych  Headaches, PSYCHIATRIC DISORDERS Anxiety Depression Dementia    GI/Hepatic Neg liver ROS, GERD  ,  Endo/Other  Hypothyroidism   Renal/GU CRFRenal disease     Musculoskeletal  (+) Arthritis ,   Abdominal Normal abdominal exam  (+)   Peds  Hematology negative hematology ROS (+)   Anesthesia Other Findings   Reproductive/Obstetrics                            Anesthesia Physical Anesthesia Plan  ASA: III  Anesthesia Plan: General   Post-op Pain Management:    Induction: Intravenous  PONV Risk Score and Plan: 4 or greater and Ondansetron, Dexamethasone and Treatment may vary due to age or medical condition  Airway Management Planned: LMA  Additional Equipment: None  Intra-op Plan:   Post-operative Plan: Extubation in OR  Informed Consent: I have reviewed the patients History and Physical, chart, labs and discussed the procedure including the risks, benefits and alternatives for the proposed anesthesia with the patient or authorized representative who has indicated his/her understanding and acceptance.     Consent reviewed with POA  Plan Discussed with: CRNA  Anesthesia Plan Comments:        Anesthesia Quick Evaluation

## 2019-08-23 NOTE — Anesthesia Procedure Notes (Signed)
Procedure Name: LMA Insertion Date/Time: 08/23/2019 11:30 AM Performed by: Lieutenant Diego, CRNA Pre-anesthesia Checklist: Patient identified, Emergency Drugs available, Suction available and Patient being monitored Patient Re-evaluated:Patient Re-evaluated prior to induction Oxygen Delivery Method: Circle system utilized Preoxygenation: Pre-oxygenation with 100% oxygen Induction Type: IV induction Ventilation: Mask ventilation without difficulty LMA: LMA inserted LMA Size: 4.0 Number of attempts: 1 Placement Confirmation: positive ETCO2 and breath sounds checked- equal and bilateral Tube secured with: Tape Dental Injury: Teeth and Oropharynx as per pre-operative assessment

## 2019-08-23 NOTE — H&P (Signed)
Regina Porter is an 85 y.o. female.G5P5L5.  Accompanied by her daughter   RP: PMB with thickened endometrium/Polyps forHSC/Myosure Excisions/D+C on 08/23/2019  HPI:  No change x visit 08/18/2019. PMB with a thickened Endometrial line/Probable Polyps on Pelvic US 02/16/2019.  History of breast cancer with postmenopausal bleeding while on tamoxifen and persisting since she stopped 1 1/2 yr ago. Mild pelvic cramping.  No UTI Sx.  BMs normal. CHTN controled on Meds. Hypothyroidism on Synthroid.    Menstrual History: No LMP recorded. Patient is postmenopausal.    Past Medical History:  Diagnosis Date  . Allergy    mild   . Anemia   . Anxiety   . Arthritis    pt states no pain   . Breast cancer ([place])    left   . Chronic kidney disease    ckd stage 4 per lov dr deterding 08-03-2018 on chart  . Dementia ([place])   . Depression   . Dyspnea    W/ PAIN   . Gastroesophageal reflux disease   . Headache    TO SEE NEURO MD  . Heart murmur    mild per pt.  . Heartburn   . Hypertension   . Hypothyroidism   . Leg cramps   . Post-menopause bleeding   . Restless leg syndrome 10/07/2014  . Sleep apnea     Past Surgical History:  Procedure Laterality Date  . back sugery   2019   lower back   . BREAST LUMPECTOMY    . BREAST SURGERY Left 2013   Lumpectomy  radiation done no chemo  . CATARACT EXTRACTION, BILATERAL    . hysteroscopy biopsy      Family History  Problem Relation Age of Onset  . Hypertension Mother   . Arthritis Mother   . Heart disease Mother   . Hypertension Father   . Heart disease Father   . Cancer Brother   . Breast cancer Paternal Aunt   . Colon cancer Neg Hx   . Colon polyps Neg Hx   . Esophageal cancer Neg Hx   . Rectal cancer Neg Hx   . Stomach cancer Neg Hx     Social History:  reports that she quit smoking about 13 years ago. She has a 2.00 pack-year smoking history. She has never used smokeless tobacco. She reports that she does not drink alcohol and does  not use drugs.  Allergies:  Allergies  Allergen Reactions  . Shellfish Allergy Swelling and Rash    MOUTH  . Neomy-Bacit-Polymyx-Pramoxine     UNSPECIFIED REACTION  Not sure which antibiotic she has a reaction to    Medications Prior to Admission  Medication Sig Dispense Refill Last Dose  . acetaminophen (TYLENOL) 500 MG tablet Take 500 mg by mouth daily as needed.     Marland Kitchen amLODipine (NORVASC) 10 MG tablet TAKE 1 TABLET BY MOUTH EVERY DAY 90 tablet 0   . aspirin 81 MG EC tablet Take by mouth.   08/13/2019  . bumetanide (BUMEX) 2 MG tablet Take 2 mg by mouth daily.  6   . busPIRone (BUSPAR) 10 MG tablet TAKE 1 TABLET BY MOUTH TWICE DAILY .DX F41.9 APPT IS OVERDUE 180 tablet 1   . Carboxymethylcellulose Sodium (EYE DROPS OP) Apply to eye as needed.     Marland Kitchen Cod Liver Oil 1000 MG CAPS Take 1 capsule by mouth 2 (two) times daily.      Marland Kitchen donepezil (ARICEPT) 5 MG tablet TAKE 1 TABLET BY  MOUTH EVERYDAY AT BEDTIME DX: G31.84 90 tablet 0   . hydrALAZINE (APRESOLINE) 100 MG tablet TAKE 1 TABLET (100 MG TOTAL) BY MOUTH 3 (THREE) TIMES DAILY. 270 tablet 1   . levothyroxine (SYNTHROID) 75 MCG tablet TAKE ONE TABLET BY MOUTH ONCE DAILY 30 MINUTES BEFORE BREAKFAST FOR THYROID 90 tablet 0   . losartan (COZAAR) 100 MG tablet TAKE 1 TABLET BY MOUTH EVERYDAY AT BEDTIME 90 tablet 1   . metoprolol succinate (TOPROL-XL) 100 MG 24 hr tablet TAKE 1 TABLET BY MOUTH EVERYDAY AT BEDTIME 90 tablet 0   . traZODone (DESYREL) 50 MG tablet TAKE 1 TABLET BY MOUTH EVERYDAY AT BEDTIME 90 tablet 0     REVIEW OF SYSTEMS: A ROS was performed and pertinent positives and negatives are included in the history.  GENERAL: No fevers or chills. HEENT: No change in vision, no earache, sore throat or sinus congestion. NECK: No pain or stiffness. CARDIOVASCULAR: No chest pain or pressure. No palpitations. PULMONARY: No shortness of breath, cough or wheeze. GASTROINTESTINAL: No abdominal pain, nausea, vomiting or diarrhea, melena or bright  red blood per rectum. GENITOURINARY: No urinary frequency, urgency, hesitancy or dysuria. MUSCULOSKELETAL: No joint or muscle pain, no back pain, no recent trauma. DERMATOLOGIC: No rash, no itching, no lesions. ENDOCRINE: No polyuria, polydipsia, no heat or cold intolerance. No recent change in weight. HEMATOLOGICAL: No anemia or easy bruising or bleeding. NEUROLOGIC: No headache, seizures, numbness, tingling or weakness. PSYCHIATRIC: No depression, no loss of interest in normal activity or change in sleep pattern.     Blood pressure (!) 159/47, pulse 68, temperature 98.6 F (37 C), temperature source Oral, resp. rate 14, height 5\' 4"  (1.626 m), weight 86.2 kg, SpO2 100 %.  Physical Exam:  See office notes  Pelvic US 02/16/2019: T/V images. Anteverted uterus with inhomogenous myometrium with at least 2 focal intramural fibroids,measured at 3.15 and 2.03 cm. The uterus is measured at 9.58 x 7.07 x 5.8 cm. Thickened endometrial lining with irregular margins measuring 17.17 mm. Feeder vessels noted leading to areas of thickening. Bilateral ovaries small with atrophic appearance. No adnexal mass. No free fluid in the posterior cul-de-sac.  Covid Negative Hb 11.1 on 03/10/2019  Cardio Clearance obtained  Assessment/Plan:  85 y.o. G5P5   1. Postmenopausal bleeding PMB during Tamoxifen treatment and after.  Pelvic US 02/16/2019 showed a thickened Endometrial line at 17.17 mm with feeder vessels c/w Endometrial Polyps.  No CI to HSC/Myosure Excision/D+C.  Surgery scheduled on 08/23/2019. Surgical procedure, risks/benefits thoroughly reviewed with patient.  2. Endometrial polyp As above.  3. History of left breast cancer Stopped Tamoxifen x 1 1/2 yr.                        Patient was counseled as to the risk of surgery to include the following:  1. Infection (prohylactic antibiotics will be administered)  2. DVT/Pulmonary Embolism (prophylactic pneumo compression stockings will be  used)  3.Trauma to internal organs requiring additional surgical procedure to repair any injury to internal organs requiring perhaps additional hospitalization days.  4.Hemmorhage requiring transfusion and blood products which carry risks such as anaphylactic reaction, hepatitis and AIDS  Patient had received literature information on the procedure scheduled and all her questions were answered and fully accepts all risk.   Marie-Lyne Greco Gastelum 08/23/2019, 9:54 AM

## 2019-08-23 NOTE — Discharge Instructions (Addendum)
   Postoperative Instructions Hysteroscopy D & C  Dr. Dellis Filbert and the nursing staff have discussed postoperative instructions with you.  If you have any questions please ask them before you leave the hospital, or call Dr Assunta Curtis office at [phone].    We would like to emphasize the following instructions:   ? Call the office to make your follow-up appointment as recommended by Dr Dellis Filbert (usually 1-2 weeks).  ? You were given a prescription, or one was ordered for you at the pharmacy you designated.  Get that prescription filled and take the medication according to instructions.  ? You may eat a regular diet, but slowly until you start having bowel movements.  ? Drink plenty of water daily.  ? Nothing in the vagina (intercourse, douching, objects of any kind) for two weeks.  When reinitiating intercourse, if it is uncomfortable, stop and make an appointment with Dr Dellis Filbert to be evaluated.  ? No driving for one to two days until the effects of anesthesia has worn off.  No traveling out of town for several days.  ? You may shower, but no baths for one week.  Walking up and down stairs is ok.  No heavy lifting, prolonged standing, repeated bending or any "working out" until your post op check.  ? Rest frequently, listen to your body and do not push yourself and overdo it.  ? Call if:  o Your pain medication does not seem strong enough. o Worsening pain or abdominal bloating o Persistent nausea or vomiting o Difficulty with urination or bowel movements. o Temperature of 101 degrees or higher. o Heavy vaginal bleeding.  If your period is due, you may use tampons. o You have any questions or concerns  Post Anesthesia Home Care Instructions  Activity: Get plenty of rest for the remainder of the day. A responsible individual must stay with you for 24 hours following the procedure.  For the next 24 hours, DO NOT: -Drive a car -Paediatric nurse -Drink alcoholic beverages -Take any  medication unless instructed by your physician -Make any legal decisions or sign important papers.  Meals: Start with liquid foods such as gelatin or soup. Progress to regular foods as tolerated. Avoid greasy, spicy, heavy foods. If nausea and/or vomiting occur, drink only clear liquids until the nausea and/or vomiting subsides. Call your physician if vomiting continues.  Special Instructions/Symptoms: Your throat may feel dry or sore from the anesthesia or the breathing tube placed in your throat during surgery. If this causes discomfort, gargle with warm salt water. The discomfort should disappear within 24 hours.

## 2019-08-23 NOTE — Op Note (Signed)
Operative Note  08/23/2019  12:07 PM  PATIENT:  Regina Porter  85 y.o. female  PRE-OPERATIVE DIAGNOSIS:  Post menopausal bleeding; thickened endometrium, probable polyps  POST-OPERATIVE DIAGNOSIS:  Post menopausal bleeding; thickened endometrium, probable Endometrial Cancer  PROCEDURE:  Procedure(s): HYSTEROSCOPY WITH MYOSURE EXCISION, DILATATION & CURETTAGE  SURGEON:  Surgeon(s): [name], MD  ANESTHESIA:   general  FINDINGS: Very thickened, vascular, irregular Endometrium compatible with Endometrial Cancer.  DESCRIPTION OF OPERATION: Under general anesthesia with laryngeal mask, the patient is in lithotomy position.  She is prepped with Betadine on the supra pubic, vulvar and vaginal area.  The bladder is catheterized.  The patient is draped as usual.  Timeout is done.  The vaginal exam reveals an anteverted uterus, normal volume, mobile.  No adnexal mass.  The speculum is inserted in the vagina.  The anterior lip of the cervix is grasped with a tenaculum.  A paracervical block is done with lidocaine a total of 10 cc at 4 and 8:00.  Dilation of the cervix with Pratt dilators up to #19 without difficulty.  The hysteroscope was inserted in the intra uterine cavity.  The endometrium is very thick and irregular with increased vascularity, compatible with endometrial cancer.  Pictures are taken. The MyoSure reach is inserted.  Excision of thickened irregular endometrium.  We then removed the instruments.  A systematic curettage of the intrauterine cavity on all surfaces is done with a sharp curette.  Both specimens are sent together to pathology.  We go back with the hysteroscope and excise some more material.  The endometrial lining is too thick and abundant to remove it all. Pictures are taken again. Hemostasis is adequate. We therefore remove the hysteroscope with the MyoSure reach.  The tenaculum was removed from the cervix.  Hemostasis is adequate. The speculum is removed.  The patient  is brought to recovery room in good and stable status.  ESTIMATED BLOOD LOSS: 10 mL Fluid deficit: 10 cc  Intake/Output Summary (Last 24 hours) at 08/23/2019 1207 Last data filed at 08/23/2019 1148 Gross per 24 hour  Intake 600 ml  Output 60 ml  Net 540 ml     BLOOD ADMINISTERED:none   LOCAL MEDICATIONS USED:  LIDOCAINE   SPECIMEN:  Source of Specimen:  Excision material from thickened irregular Endometrium and Endometrial curettings  DISPOSITION OF SPECIMEN:  PATHOLOGY  COUNTS:  YES  PLAN OF CARE: Transfer to PACU  Marie-Lyne LavoieMD12:07 PM

## 2019-08-23 NOTE — Anesthesia Postprocedure Evaluation (Signed)
Anesthesia Post Note  Patient: Regina Porter  Procedure(s) Performed: DILATATION & CURETTAGE/HYSTEROSCOPY WITH MYOSURE (N/A Vagina )     Patient location during evaluation: PACU Anesthesia Type: General Level of consciousness: awake and alert Pain management: pain level controlled Vital Signs Assessment: post-procedure vital signs reviewed and stable Respiratory status: spontaneous breathing, nonlabored ventilation, respiratory function stable and patient connected to nasal cannula oxygen Cardiovascular status: blood pressure returned to baseline and stable Postop Assessment: no apparent nausea or vomiting Anesthetic complications: no   No complications documented.  Last Vitals:  Vitals:   08/23/19 1245 08/23/19 1300  BP: (!) 155/44 (!) 164/51  Pulse: 73   Resp: 12 18  Temp:  36.7 C  SpO2: 100% 96%    Last Pain:  Vitals:   08/23/19 1245  TempSrc:   PainSc: 0-No pain                 Effie Berkshire

## 2019-08-24 LAB — SURGICAL PATHOLOGY

## 2019-08-25 ENCOUNTER — Telehealth: Payer: Self-pay

## 2019-08-25 ENCOUNTER — Telehealth: Payer: Self-pay | Admitting: *Deleted

## 2019-08-25 ENCOUNTER — Encounter (HOSPITAL_BASED_OUTPATIENT_CLINIC_OR_DEPARTMENT_OTHER): Payer: Self-pay | Admitting: Obstetrics & Gynecology

## 2019-08-25 DIAGNOSIS — C541 Malignant neoplasm of endometrium: Secondary | ICD-10-CM

## 2019-08-25 NOTE — Telephone Encounter (Signed)
Called and spoke with the patient's daughter; scheduled the patient for this Friday 8/13 at 1pm

## 2019-08-25 NOTE — Telephone Encounter (Signed)
Dr. Dellis Filbert asked me to call patient and let her know pathology result was abnormal and she will talk with her in detail about result on Tuesday at her scheduled office visit.  She asked me to let her know that we are going to refer her to GYN-Oncology.  I left message for daughter Latanya Maudlin to call me back.

## 2019-08-25 NOTE — Telephone Encounter (Signed)
I spoke with daughter and informed her. In our conversation I did explain that there were "cancer cells" in the tissue that was removed at surgery.  She was fine with [place] arranging Gyn-Onc appt. She knows they will call to schedule.  I told her to call back if any questions after she talks with her Mom.

## 2019-08-26 NOTE — H&P (View-Only) (Signed)
GYNECOLOGIC ONCOLOGY NEW PATIENT CONSULTATION   Patient Name: Regina Porter  Patient Age: 85 y.o. Date of Service: 08/27/19 Referring Provider: Dr. Dellis Filbert  Primary Care Provider: [name], NP Consulting Provider: Jeral Pinch, MD   Assessment/Plan:  Postmenopausal patient with a personal history of breast cancer requiring tamoxifen with persistent postmenopausal bleeding for some time and recent endometrial sampling showing grade 3 endometrial carcinoma, clinically stage I on exam today.  We reviewed the nature of endometrial cancer and its recommended surgical staging, including total hysterectomy, bilateral salpingo-oophorectomy, and lymph node assessment. The patient is a suitable candidate for staging via a minimally invasive approach to surgery.  We reviewed that robotic assistance would be used to complete the surgery.    We discussed that most endometrial cancer is detected early, however, we reviewed that adjuvant therapy will likely be recommended based on the patient's biopsy, however, we will defer to final pathology results.   Given her high risk histology, I recommend CT scan preoperatively to rule out metastatic disease.  Given her kidney dysfunction, I have ordered this without contrast.  We reviewed the sentinel lymph node technique. Risks and benefits of sentinel lymph node biopsy was reviewed. We reviewed the technique and ICG dye. The patient DOES NOT have known liver dysfunction. We reviewed the false negative rate (0.4%), and that 3% of patients with metastatic disease will not have it detected by SLN biopsy in endometrial cancer. A low risk of allergic reaction to the dye, <0.2% for ICG, has been reported. We also discussed that in the case of failed mapping, which occurs 40% of the time, a bilateral or unilateral lymphadenectomy will be performed at the surgeon's discretion.   Potential benefits of sentinel nodes including a higher detection rate for metastasis  due to ultrastaging and potential reduction in operative morbidity. However, there remains uncertainty as to the role for treatment of micrometastatic disease. Further, the benefit of operative morbidity associated with the SLN technique in endometrial cancer is not yet completely known. In other patient populations (e.g. the cervical cancer population) there has been observed reductions in morbidity with SLN biopsy compared to pelvic lymphadenectomy. Lymphedema, nerve dysfunction and lymphocysts are all potential risks with the SLN technique as with complete lymphadenectomy. Additional risks to the patient include the risk of damage to an internal organ while operating in an altered view (e.g. the black and white image of the robotic fluorescence imaging mode).   We discussed the plan for a robotic assisted hysterectomy, bilateral salpingo-oophorectomy, sentinel lymph node evaluation, possible lymph node dissection, possible laparotomy. The risks of surgery were discussed in detail and she understands these to include infection; wound separation; hernia; vaginal cuff separation, injury to adjacent organs such as bowel, bladder, blood vessels, ureters and nerves; bleeding which may require blood transfusion; anesthesia risk; thromboembolic events; possible death; unforeseen complications; possible need for re-exploration; medical complications such as heart attack, stroke, pleural effusion and pneumonia; and, if full lymphadenectomy is performed the risk of lymphedema and lymphocyst. The patient will receive DVT and antibiotic prophylaxis as indicated. She voiced a clear understanding. She had the opportunity to ask questions. Perioperative instructions were reviewed with her. Prescriptions for post-op medications were sent to her pharmacy of choice.  The patient has significant medical comorbidities.  We will get clearance from cardiology as well as her PCP.  I would also like her to see a kidney specialist at  [facility], where she has not been seen for more than a year.  We will plan for CT without contrast.  We discussed that in the event she were not to map during surgery, I may make the decision to perform only pelvic lymph node dissection versus no lymph node dissection based on how she is doing during surgery and to limit morbidity in the setting of her very likely needing adjuvant therapy.  This will be somewhat dependent as well on whether there is evidence of metastatic disease on her preoperative CT scan.  Surgery is tentatively scheduled for August 26.  A copy of this note was sent to the patient's referring provider.   65 minutes of total time was spent for this patient encounter, including preparation, face-to-face counseling with the patient and coordination of care, and documentation of the encounter.   Jeral Pinch, MD  Division of Gynecologic Oncology  Department of Obstetrics and Gynecology  [facility]  ___________________________________________  Chief Complaint: Chief Complaint  Patient presents with  . Endometrial cancer determined by uterine biopsy ([place])    History of Present Illness:  Regina Porter is a 85 y.o. y.o. female who is seen in consultation at the request of Dr. Dellis Filbert for an evaluation of newly diagnosed uterine cancer.  Patient reports menopause in her early 38s.  She had postmenopausal bleeding for the first time soon after starting tamoxifen.  She describes this as spotting daily that would require the use of a panty liner but was not very much.  Her bleeding got heavier for about a week after she stopped tamoxifen approximately 1.5 years ago.  It then stopped completely about a month after she stopped tamoxifen.  She had recurrence of the bleeding 3 to 4 months later that she describes as being more similar to her menses.  She would have bleeding for a number of days and then stop.  This cycle would occur about once a month.   Since her recent procedure, she has been feeling well.  Her bleeding has decreased and almost stopped.  She denies any abdominal pain or cramping.  She endorses a slight decrease in appetite as well as some early satiety.  She denies any nausea or emesis.  She reports that her bowel movements and are at baseline for her.  She notes some increased urinary frequency that began about a year ago.  Last saw her cardiologist, Dr. Irish Lack, in 03/2019 for her history of diastolic dysfunction without heart failure, CKD and HTN.   Patient lives with her daughter in [place].  She has a history of tobacco use, denies any currently and denies any alcohol or drug use.  PAST MEDICAL HISTORY:  Past Medical History:  Diagnosis Date  . Allergy    mild   . Anemia   . Anxiety   . Arthritis    pt states no pain   . Breast cancer ([place])    left   . Chronic kidney disease    ckd stage 4 per lov dr deterding 08-03-2018 on chart  . Dementia ([place])   . Depression   . Dyspnea    W/ PAIN   . Gastroesophageal reflux disease   . Headache    TO SEE NEURO MD  . Heart murmur    mild per pt.  . Heartburn   . Hypertension   . Hypothyroidism   . Leg cramps   . Post-menopause bleeding   . Restless leg syndrome 10/07/2014  . Sleep apnea      PAST SURGICAL HISTORY:  Past Surgical History:  Procedure Laterality Date  . back sugery   2019   lower back   . BREAST LUMPECTOMY    . BREAST SURGERY Left 2013   Lumpectomy  radiation done no chemo  . CATARACT EXTRACTION, BILATERAL    . DILATATION & CURETTAGE/HYSTEROSCOPY WITH MYOSURE N/A 08/23/2019   Procedure: [place];  Surgeon: [name], MD;  Location: [facility];  Service: Gynecology;  Laterality: N/A;  request to follow 2nd case in [place] block requests one hour  . hysteroscopy biopsy      OB/GYN HISTORY:  OB History  Gravida Para Term Preterm AB Living  5 5       5   SAB TAB  Ectopic Multiple Live Births               # Outcome Date GA Lbr Len/2nd Weight Sex Delivery Anes PTL Lv  5 Para           4 Para           3 Para           2 Para           1 Para             No LMP recorded. Patient is postmenopausal.  Age at menarche: 73 Age at menopause: Early 62s Hx of HRT: Denies Hx of STDs: Denies Last pap: 2021 History of abnormal pap smears: Denies  SCREENING STUDIES:  Last mammogram: 09/2018  Last colonoscopy: 2020 Last bone mineral density: 2016  MEDICATIONS: Outpatient Encounter Medications as of 08/27/2019  Medication Sig  . acetaminophen (TYLENOL) 500 MG tablet Take 500 mg by mouth daily as needed.  Marland Kitchen amLODipine (NORVASC) 10 MG tablet TAKE 1 TABLET BY MOUTH EVERY DAY  . aspirin 81 MG EC tablet Take by mouth.  . bumetanide (BUMEX) 2 MG tablet Take 2 mg by mouth daily.  . busPIRone (BUSPAR) 10 MG tablet TAKE 1 TABLET BY MOUTH TWICE DAILY .DX F41.9 APPT IS OVERDUE  . Carboxymethylcellulose Sodium (EYE DROPS OP) Apply to eye as needed.  Marland Kitchen Cod Liver Oil 1000 MG CAPS Take 1 capsule by mouth 2 (two) times daily.   Marland Kitchen donepezil (ARICEPT) 5 MG tablet TAKE 1 TABLET BY MOUTH EVERYDAY AT BEDTIME DX: G31.84  . hydrALAZINE (APRESOLINE) 100 MG tablet TAKE 1 TABLET (100 MG TOTAL) BY MOUTH 3 (THREE) TIMES DAILY.  Marland Kitchen levothyroxine (SYNTHROID) 75 MCG tablet TAKE ONE TABLET BY MOUTH ONCE DAILY 30 MINUTES BEFORE BREAKFAST FOR THYROID  . losartan (COZAAR) 100 MG tablet TAKE 1 TABLET BY MOUTH EVERYDAY AT BEDTIME  . megestrol (MEGACE) 40 MG tablet Take 1 tablet (40 mg total) by mouth 2 (two) times daily.  . metoprolol succinate (TOPROL-XL) 100 MG 24 hr tablet TAKE 1 TABLET BY MOUTH EVERYDAY AT BEDTIME  . traZODone (DESYREL) 50 MG tablet TAKE 1 TABLET BY MOUTH EVERYDAY AT BEDTIME  . senna-docusate (SENOKOT-S) 8.6-50 MG tablet Take 2 tablets by mouth at bedtime. For AFTER surgery, do not take if having diarrhea  . traMADol (ULTRAM) 50 MG tablet Take 1 tablet (50 mg total)  by mouth every 12 (twelve) hours as needed for severe pain. For AFTER surgery, do not take and drive   No facility-administered encounter medications on file as of 08/27/2019.    ALLERGIES:  Allergies  Allergen Reactions  . Shellfish Allergy Swelling and Rash    MOUTH  . Neomy-Bacit-Polymyx-Pramoxine     UNSPECIFIED REACTION  Not sure  which antibiotic she has a reaction to     FAMILY HISTORY:  Family History  Problem Relation Age of Onset  . Hypertension Mother   . Arthritis Mother   . Heart disease Mother   . Hypertension Father   . Heart disease Father   . Breast cancer Sister   . Lung cancer Brother   . Breast cancer Paternal Aunt   . Colon cancer Neg Hx   . Colon polyps Neg Hx   . Esophageal cancer Neg Hx   . Rectal cancer Neg Hx   . Stomach cancer Neg Hx      SOCIAL HISTORY:    Social Connections:   . Frequency of Communication with Friends and Family:   . Frequency of Social Gatherings with Friends and Family:   . Attends Religious Services:   . Active Member of Clubs or Organizations:   . Attends Archivist Meetings:   Marland Kitchen Marital Status:     REVIEW OF SYSTEMS:  Pertinent positives as per HPI Denies fevers, chills, fatigue, unexplained weight changes. Denies hearing loss, neck lumps or masses, mouth sores, ringing in ears or voice changes. Denies cough or wheezing.  Denies shortness of breath. Denies chest pain or palpitations. Denies leg swelling. Denies abdominal distention, pain, blood in stools, constipation, diarrhea, nausea, vomiting. Denies pain with intercourse, dysuria, frequency, hematuria or incontinence. Denies hot flashes, pelvic pain, or vaginal discharge.   Denies joint pain, back pain or muscle pain/cramps. Denies itching, rash, or wounds. Denies dizziness, headaches, numbness or seizures. Denies swollen lymph nodes or glands, denies easy bruising or bleeding. Denies anxiety, depression, confusion, or decreased  concentration.  Physical Exam:  Vital Signs for this encounter:  Blood pressure (!) 159/50, pulse 68, temperature 98.4 F (36.9 C), resp. rate 18, height 5\' 4"  (1.626 m), weight 182 lb (82.6 kg), SpO2 100 %. Body mass index is 31.24 kg/m. General: Alert, oriented, no acute distress.  HEENT: Normocephalic, atraumatic. Sclera anicteric.  Chest: Clear to auscultation bilaterally. No wheezes, rhonchi, or rales. Cardiovascular: Regular rate and rhythm, no murmurs, rubs, or gallops.  Abdomen: Normoactive bowel sounds. Soft, nondistended, nontender to palpation. No masses or hepatosplenomegaly appreciated. No palpable fluid wave.  Extremities: Grossly normal range of motion. Warm, well perfused. No edema bilaterally.  Skin: No rashes or lesions.  Lymphatics: No cervical, supraclavicular, or inguinal adenopathy.  GU:  Normal external female genitalia. No lesions. No discharge or bleeding.             Bladder/urethra:  No lesions or masses, well supported bladder             Vagina: Mildly atrophic, no lesions or masses.             Cervix: Normal appearing, no lesions.  Some changes consistent with recent procedure, such as tenaculum site.             Uterus: Small, mobile, no parametrial involvement or nodularity.             Adnexa: No masses appreciated.  Rectal: No nodularity  LABORATORY AND RADIOLOGIC DATA:  Outside medical records were reviewed to synthesize the above history, along with the history and physical obtained during the visit.   Lab Results  Component Value Date   WBC 6.2 08/23/2019   HGB 12.6 08/23/2019   HCT 37.0 08/23/2019   PLT 439 (H) 08/23/2019   GLUCOSE 95 08/23/2019   CHOL 146 08/06/2018   TRIG 164 (H) 08/06/2018   HDL  42 (L) 08/06/2018   LDLCALC 78 08/06/2018   ALT 13 08/06/2018   AST 18 08/06/2018   NA 143 08/23/2019   K 4.3 08/23/2019   CL 106 08/23/2019   CREATININE 2.10 (H) 08/23/2019   BUN 21 08/23/2019   CO2 26 08/06/2018   TSH 0.37 (L)  08/06/2018   INR 1.00 05/30/2017   Pelvic ultrasound 02/16/19: Anteverted uterus with inhomogenous myometrium with at least 2 focal intramural fibroids,measured at 3.15 and 2.03 cm. The uterus is measured at 9.58 x 7.07 x 5.8 cm. Thickened endometrial lining with irregular margins measuring 17.17 mm. Feeder vessels noted leading to areas of thickening. Bilateral ovaries small with atrophic appearance. No adnexal mass. No free fluid in the posterior cul-de-sac.  D&C on 08/23/19: A. ENDOMETRIUM, CURETTAGE:  - High-grade carcinoma  - See comment  COMMENT:  The biopsy consists of a high-grade carcinoma. Based on the biopsy the differential diagnosis includes serous, high-grade endometrioid and undifferentiated carcinoma. These results were discussed with Dr. Assunta Curtis nurse, Juliann Pulse, on August 24, 2019.

## 2019-08-26 NOTE — Progress Notes (Signed)
GYNECOLOGIC ONCOLOGY NEW PATIENT CONSULTATION   Patient Name: Regina Porter  Patient Age: 85 y.o. Date of Service: 08/27/19 Referring Provider: Dr. Dellis Filbert  Primary Care Provider: [name], NP Consulting Provider: Jeral Pinch, MD   Assessment/Plan:  Postmenopausal patient with a personal history of breast cancer requiring tamoxifen with persistent postmenopausal bleeding for some time and recent endometrial sampling showing grade 3 endometrial carcinoma, clinically stage I on exam today.  We reviewed the nature of endometrial cancer and its recommended surgical staging, including total hysterectomy, bilateral salpingo-oophorectomy, and lymph node assessment. The patient is a suitable candidate for staging via a minimally invasive approach to surgery.  We reviewed that robotic assistance would be used to complete the surgery.    We discussed that most endometrial cancer is detected early, however, we reviewed that adjuvant therapy will likely be recommended based on the patient's biopsy, however, we will defer to final pathology results.   Given her high risk histology, I recommend CT scan preoperatively to rule out metastatic disease.  Given her kidney dysfunction, I have ordered this without contrast.  We reviewed the sentinel lymph node technique. Risks and benefits of sentinel lymph node biopsy was reviewed. We reviewed the technique and ICG dye. The patient DOES NOT have known liver dysfunction. We reviewed the false negative rate (0.4%), and that 3% of patients with metastatic disease will not have it detected by SLN biopsy in endometrial cancer. A low risk of allergic reaction to the dye, <0.2% for ICG, has been reported. We also discussed that in the case of failed mapping, which occurs 40% of the time, a bilateral or unilateral lymphadenectomy will be performed at the surgeon's discretion.   Potential benefits of sentinel nodes including a higher detection rate for metastasis  due to ultrastaging and potential reduction in operative morbidity. However, there remains uncertainty as to the role for treatment of micrometastatic disease. Further, the benefit of operative morbidity associated with the SLN technique in endometrial cancer is not yet completely known. In other patient populations (e.g. the cervical cancer population) there has been observed reductions in morbidity with SLN biopsy compared to pelvic lymphadenectomy. Lymphedema, nerve dysfunction and lymphocysts are all potential risks with the SLN technique as with complete lymphadenectomy. Additional risks to the patient include the risk of damage to an internal organ while operating in an altered view (e.g. the black and white image of the robotic fluorescence imaging mode).   We discussed the plan for a robotic assisted hysterectomy, bilateral salpingo-oophorectomy, sentinel lymph node evaluation, possible lymph node dissection, possible laparotomy. The risks of surgery were discussed in detail and she understands these to include infection; wound separation; hernia; vaginal cuff separation, injury to adjacent organs such as bowel, bladder, blood vessels, ureters and nerves; bleeding which may require blood transfusion; anesthesia risk; thromboembolic events; possible death; unforeseen complications; possible need for re-exploration; medical complications such as heart attack, stroke, pleural effusion and pneumonia; and, if full lymphadenectomy is performed the risk of lymphedema and lymphocyst. The patient will receive DVT and antibiotic prophylaxis as indicated. She voiced a clear understanding. She had the opportunity to ask questions. Perioperative instructions were reviewed with her. Prescriptions for post-op medications were sent to her pharmacy of choice.  The patient has significant medical comorbidities.  We will get clearance from cardiology as well as her PCP.  I would also like her to see a kidney specialist at  [facility], where she has not been seen for more than a year.  We will plan for CT without contrast.  We discussed that in the event she were not to map during surgery, I may make the decision to perform only pelvic lymph node dissection versus no lymph node dissection based on how she is doing during surgery and to limit morbidity in the setting of her very likely needing adjuvant therapy.  This will be somewhat dependent as well on whether there is evidence of metastatic disease on her preoperative CT scan.  Surgery is tentatively scheduled for August 26.  A copy of this note was sent to the patient's referring provider.   65 minutes of total time was spent for this patient encounter, including preparation, face-to-face counseling with the patient and coordination of care, and documentation of the encounter.   Jeral Pinch, MD  Division of Gynecologic Oncology  Department of Obstetrics and Gynecology  [facility]  ___________________________________________  Chief Complaint: Chief Complaint  Patient presents with  . Endometrial cancer determined by uterine biopsy ([place])    History of Present Illness:  Regina Porter is a 85 y.o. y.o. female who is seen in consultation at the request of Dr. Dellis Filbert for an evaluation of newly diagnosed uterine cancer.  Patient reports menopause in her early 5s.  She had postmenopausal bleeding for the first time soon after starting tamoxifen.  She describes this as spotting daily that would require the use of a panty liner but was not very much.  Her bleeding got heavier for about a week after she stopped tamoxifen approximately 1.5 years ago.  It then stopped completely about a month after she stopped tamoxifen.  She had recurrence of the bleeding 3 to 4 months later that she describes as being more similar to her menses.  She would have bleeding for a number of days and then stop.  This cycle would occur about once a month.   Since her recent procedure, she has been feeling well.  Her bleeding has decreased and almost stopped.  She denies any abdominal pain or cramping.  She endorses a slight decrease in appetite as well as some early satiety.  She denies any nausea or emesis.  She reports that her bowel movements and are at baseline for her.  She notes some increased urinary frequency that began about a year ago.  Last saw her cardiologist, Dr. Irish Lack, in 03/2019 for her history of diastolic dysfunction without heart failure, CKD and HTN.   Patient lives with her daughter in [place].  She has a history of tobacco use, denies any currently and denies any alcohol or drug use.  PAST MEDICAL HISTORY:  Past Medical History:  Diagnosis Date  . Allergy    mild   . Anemia   . Anxiety   . Arthritis    pt states no pain   . Breast cancer ([place])    left   . Chronic kidney disease    ckd stage 4 per lov dr deterding 08-03-2018 on chart  . Dementia ([place])   . Depression   . Dyspnea    W/ PAIN   . Gastroesophageal reflux disease   . Headache    TO SEE NEURO MD  . Heart murmur    mild per pt.  . Heartburn   . Hypertension   . Hypothyroidism   . Leg cramps   . Post-menopause bleeding   . Restless leg syndrome 10/07/2014  . Sleep apnea      PAST SURGICAL HISTORY:  Past Surgical History:  Procedure Laterality Date  . back sugery   2019   lower back   . BREAST LUMPECTOMY    . BREAST SURGERY Left 2013   Lumpectomy  radiation done no chemo  . CATARACT EXTRACTION, BILATERAL    . DILATATION & CURETTAGE/HYSTEROSCOPY WITH MYOSURE N/A 08/23/2019   Procedure: [place];  Surgeon: [name], MD;  Location: [facility];  Service: Gynecology;  Laterality: N/A;  request to follow 2nd case in [place] block requests one hour  . hysteroscopy biopsy      OB/GYN HISTORY:  OB History  Gravida Para Term Preterm AB Living  5 5       5   SAB TAB  Ectopic Multiple Live Births               # Outcome Date GA Lbr Len/2nd Weight Sex Delivery Anes PTL Lv  5 Para           4 Para           3 Para           2 Para           1 Para             No LMP recorded. Patient is postmenopausal.  Age at menarche: 53 Age at menopause: Early 27s Hx of HRT: Denies Hx of STDs: Denies Last pap: 2021 History of abnormal pap smears: Denies  SCREENING STUDIES:  Last mammogram: 09/2018  Last colonoscopy: 2020 Last bone mineral density: 2016  MEDICATIONS: Outpatient Encounter Medications as of 08/27/2019  Medication Sig  . acetaminophen (TYLENOL) 500 MG tablet Take 500 mg by mouth daily as needed.  Marland Kitchen amLODipine (NORVASC) 10 MG tablet TAKE 1 TABLET BY MOUTH EVERY DAY  . aspirin 81 MG EC tablet Take by mouth.  . bumetanide (BUMEX) 2 MG tablet Take 2 mg by mouth daily.  . busPIRone (BUSPAR) 10 MG tablet TAKE 1 TABLET BY MOUTH TWICE DAILY .DX F41.9 APPT IS OVERDUE  . Carboxymethylcellulose Sodium (EYE DROPS OP) Apply to eye as needed.  Marland Kitchen Cod Liver Oil 1000 MG CAPS Take 1 capsule by mouth 2 (two) times daily.   Marland Kitchen donepezil (ARICEPT) 5 MG tablet TAKE 1 TABLET BY MOUTH EVERYDAY AT BEDTIME DX: G31.84  . hydrALAZINE (APRESOLINE) 100 MG tablet TAKE 1 TABLET (100 MG TOTAL) BY MOUTH 3 (THREE) TIMES DAILY.  Marland Kitchen levothyroxine (SYNTHROID) 75 MCG tablet TAKE ONE TABLET BY MOUTH ONCE DAILY 30 MINUTES BEFORE BREAKFAST FOR THYROID  . losartan (COZAAR) 100 MG tablet TAKE 1 TABLET BY MOUTH EVERYDAY AT BEDTIME  . megestrol (MEGACE) 40 MG tablet Take 1 tablet (40 mg total) by mouth 2 (two) times daily.  . metoprolol succinate (TOPROL-XL) 100 MG 24 hr tablet TAKE 1 TABLET BY MOUTH EVERYDAY AT BEDTIME  . traZODone (DESYREL) 50 MG tablet TAKE 1 TABLET BY MOUTH EVERYDAY AT BEDTIME  . senna-docusate (SENOKOT-S) 8.6-50 MG tablet Take 2 tablets by mouth at bedtime. For AFTER surgery, do not take if having diarrhea  . traMADol (ULTRAM) 50 MG tablet Take 1 tablet (50 mg total)  by mouth every 12 (twelve) hours as needed for severe pain. For AFTER surgery, do not take and drive   No facility-administered encounter medications on file as of 08/27/2019.    ALLERGIES:  Allergies  Allergen Reactions  . Shellfish Allergy Swelling and Rash    MOUTH  . Neomy-Bacit-Polymyx-Pramoxine     UNSPECIFIED REACTION  Not sure  which antibiotic she has a reaction to     FAMILY HISTORY:  Family History  Problem Relation Age of Onset  . Hypertension Mother   . Arthritis Mother   . Heart disease Mother   . Hypertension Father   . Heart disease Father   . Breast cancer Sister   . Lung cancer Brother   . Breast cancer Paternal Aunt   . Colon cancer Neg Hx   . Colon polyps Neg Hx   . Esophageal cancer Neg Hx   . Rectal cancer Neg Hx   . Stomach cancer Neg Hx      SOCIAL HISTORY:    Social Connections:   . Frequency of Communication with Friends and Family:   . Frequency of Social Gatherings with Friends and Family:   . Attends Religious Services:   . Active Member of Clubs or Organizations:   . Attends Archivist Meetings:   Marland Kitchen Marital Status:     REVIEW OF SYSTEMS:  Pertinent positives as per HPI Denies fevers, chills, fatigue, unexplained weight changes. Denies hearing loss, neck lumps or masses, mouth sores, ringing in ears or voice changes. Denies cough or wheezing.  Denies shortness of breath. Denies chest pain or palpitations. Denies leg swelling. Denies abdominal distention, pain, blood in stools, constipation, diarrhea, nausea, vomiting. Denies pain with intercourse, dysuria, frequency, hematuria or incontinence. Denies hot flashes, pelvic pain, or vaginal discharge.   Denies joint pain, back pain or muscle pain/cramps. Denies itching, rash, or wounds. Denies dizziness, headaches, numbness or seizures. Denies swollen lymph nodes or glands, denies easy bruising or bleeding. Denies anxiety, depression, confusion, or decreased  concentration.  Physical Exam:  Vital Signs for this encounter:  Blood pressure (!) 159/50, pulse 68, temperature 98.4 F (36.9 C), resp. rate 18, height 5\' 4"  (1.626 m), weight 182 lb (82.6 kg), SpO2 100 %. Body mass index is 31.24 kg/m. General: Alert, oriented, no acute distress.  HEENT: Normocephalic, atraumatic. Sclera anicteric.  Chest: Clear to auscultation bilaterally. No wheezes, rhonchi, or rales. Cardiovascular: Regular rate and rhythm, no murmurs, rubs, or gallops.  Abdomen: Normoactive bowel sounds. Soft, nondistended, nontender to palpation. No masses or hepatosplenomegaly appreciated. No palpable fluid wave.  Extremities: Grossly normal range of motion. Warm, well perfused. No edema bilaterally.  Skin: No rashes or lesions.  Lymphatics: No cervical, supraclavicular, or inguinal adenopathy.  GU:  Normal external female genitalia. No lesions. No discharge or bleeding.             Bladder/urethra:  No lesions or masses, well supported bladder             Vagina: Mildly atrophic, no lesions or masses.             Cervix: Normal appearing, no lesions.  Some changes consistent with recent procedure, such as tenaculum site.             Uterus: Small, mobile, no parametrial involvement or nodularity.             Adnexa: No masses appreciated.  Rectal: No nodularity  LABORATORY AND RADIOLOGIC DATA:  Outside medical records were reviewed to synthesize the above history, along with the history and physical obtained during the visit.   Lab Results  Component Value Date   WBC 6.2 08/23/2019   HGB 12.6 08/23/2019   HCT 37.0 08/23/2019   PLT 439 (H) 08/23/2019   GLUCOSE 95 08/23/2019   CHOL 146 08/06/2018   TRIG 164 (H) 08/06/2018   HDL  42 (L) 08/06/2018   LDLCALC 78 08/06/2018   ALT 13 08/06/2018   AST 18 08/06/2018   NA 143 08/23/2019   K 4.3 08/23/2019   CL 106 08/23/2019   CREATININE 2.10 (H) 08/23/2019   BUN 21 08/23/2019   CO2 26 08/06/2018   TSH 0.37 (L)  08/06/2018   INR 1.00 05/30/2017   Pelvic ultrasound 02/16/19: Anteverted uterus with inhomogenous myometrium with at least 2 focal intramural fibroids,measured at 3.15 and 2.03 cm. The uterus is measured at 9.58 x 7.07 x 5.8 cm. Thickened endometrial lining with irregular margins measuring 17.17 mm. Feeder vessels noted leading to areas of thickening. Bilateral ovaries small with atrophic appearance. No adnexal mass. No free fluid in the posterior cul-de-sac.  D&C on 08/23/19: A. ENDOMETRIUM, CURETTAGE:  - High-grade carcinoma  - See comment  COMMENT:  The biopsy consists of a high-grade carcinoma. Based on the biopsy the differential diagnosis includes serous, high-grade endometrioid and undifferentiated carcinoma. These results were discussed with Dr. Assunta Curtis nurse, Juliann Pulse, on August 24, 2019.

## 2019-08-27 ENCOUNTER — Other Ambulatory Visit: Payer: Self-pay

## 2019-08-27 ENCOUNTER — Other Ambulatory Visit: Payer: Self-pay | Admitting: Gynecologic Oncology

## 2019-08-27 ENCOUNTER — Inpatient Hospital Stay: Payer: Medicare Other | Attending: Gynecologic Oncology | Admitting: Gynecologic Oncology

## 2019-08-27 ENCOUNTER — Encounter: Payer: Self-pay | Admitting: Gynecologic Oncology

## 2019-08-27 ENCOUNTER — Other Ambulatory Visit (HOSPITAL_COMMUNITY): Payer: Self-pay | Admitting: Gynecologic Oncology

## 2019-08-27 VITALS — BP 159/50 | HR 68 | Temp 98.4°F | Resp 18 | Ht 64.0 in | Wt 182.0 lb

## 2019-08-27 DIAGNOSIS — F419 Anxiety disorder, unspecified: Secondary | ICD-10-CM | POA: Insufficient documentation

## 2019-08-27 DIAGNOSIS — Z7982 Long term (current) use of aspirin: Secondary | ICD-10-CM | POA: Diagnosis not present

## 2019-08-27 DIAGNOSIS — R6881 Early satiety: Secondary | ICD-10-CM | POA: Insufficient documentation

## 2019-08-27 DIAGNOSIS — Z853 Personal history of malignant neoplasm of breast: Secondary | ICD-10-CM | POA: Insufficient documentation

## 2019-08-27 DIAGNOSIS — C541 Malignant neoplasm of endometrium: Secondary | ICD-10-CM

## 2019-08-27 DIAGNOSIS — F329 Major depressive disorder, single episode, unspecified: Secondary | ICD-10-CM | POA: Insufficient documentation

## 2019-08-27 DIAGNOSIS — E039 Hypothyroidism, unspecified: Secondary | ICD-10-CM | POA: Diagnosis not present

## 2019-08-27 DIAGNOSIS — M1990 Unspecified osteoarthritis, unspecified site: Secondary | ICD-10-CM | POA: Insufficient documentation

## 2019-08-27 DIAGNOSIS — Z79899 Other long term (current) drug therapy: Secondary | ICD-10-CM | POA: Diagnosis not present

## 2019-08-27 DIAGNOSIS — K219 Gastro-esophageal reflux disease without esophagitis: Secondary | ICD-10-CM | POA: Insufficient documentation

## 2019-08-27 DIAGNOSIS — F0390 Unspecified dementia without behavioral disturbance: Secondary | ICD-10-CM | POA: Insufficient documentation

## 2019-08-27 DIAGNOSIS — I129 Hypertensive chronic kidney disease with stage 1 through stage 4 chronic kidney disease, or unspecified chronic kidney disease: Secondary | ICD-10-CM | POA: Insufficient documentation

## 2019-08-27 MED ORDER — TRAMADOL HCL 50 MG PO TABS: 50.0000 mg | ORAL_TABLET | Freq: Two times a day (BID) | ORAL | 0 refills | Status: DC | PRN

## 2019-08-27 MED ORDER — SENNOSIDES-DOCUSATE SODIUM 8.6-50 MG PO TABS: 2.0000 | ORAL_TABLET | Freq: Every day | ORAL | 0 refills | Status: DC

## 2019-08-27 NOTE — Patient Instructions (Signed)
You will need to have a CT scan prior to surgery and we will contact you with the results. We will also need to obtain cardiac clearance, clearance from your primary care provider, and clearance from a nephrologist (kidney doctor).  Preparing for your Surgery  Plan for surgery on September 09, 2019 with Dr. Jeral Pinch at [facility] will be scheduled for a robotic assisted total laparoscopic hysterectomy, bilateral salpingo-oophorectomy, sentinel lymph node biopsy, possible lymph node dissection, possible laparotomy.   Pre-operative Testing -You will receive a phone call from presurgical testing at [facility] to arrange for a pre-operative appointment over the phone, lab appointment, and COVID test. The COVID test normally happens 3 days prior to the surgery and they ask that you self quarantine after the test up until surgery to decrease chance of exposure.  -Bring your insurance card, copy of an advanced directive if applicable, medication list  -At that visit, you will be asked to sign a consent for a possible blood transfusion in case a transfusion becomes necessary during surgery.  The need for a blood transfusion is rare but having consent is a necessary part of your care.     -YOU CAN CONTINUE TAKING YOUR BABY ASPIRIN UP UNTIL THE DAY BEFORE SURGERY. STOP TAKING COD LIVER OIL NOW.  -Do not take supplements such as fish oil (omega 3), red yeast rice, turmeric before your surgery.   Day Before Surgery at [place] will be asked to take in a light diet the day before surgery. You will be advised you can have clear liquids up until 3 hours before your surgery.    Eat a light diet the day before surgery.  Examples including soups, broths, toast, yogurt, mashed potatoes.  AVOID GAS PRODUCING FOODS. Things to avoid include carbonated beverages (fizzy beverages), raw fruits and raw vegetables, or beans.   If your bowels are filled with gas, your surgeon will have  difficulty visualizing your pelvic organs which increases your surgical risks.  Your role in recovery Your role is to become active as soon as directed by your doctor, while still giving yourself time to heal.  Rest when you feel tired. You will be asked to do the following in order to speed your recovery:  - Cough and breathe deeply. This helps to clear and expand your lungs and can prevent pneumonia after surgery.  - [place]. Do mild physical activity. Walking or moving your legs help your circulation and body functions return to normal. Do not try to get up or walk alone the first time after surgery.   -If you develop swelling on one leg or the other, pain in the back of your leg, redness/warmth in one of your legs, please call the office or go to the Emergency Room to have a doppler to rule out a blood clot. For shortness of breath, chest pain-seek care in the Emergency Room as soon as possible. - Actively manage your pain. Managing your pain lets you move in comfort. We will ask you to rate your pain on a scale of zero to 10. It is your responsibility to tell your doctor or nurse where and how much you hurt so your pain can be treated.  Special Considerations -If you are diabetic, you may be placed on insulin after surgery to have closer control over your blood sugars to promote healing and recovery.  This does not mean that you will be discharged on insulin.  If applicable, your oral antidiabetics will be resumed when you are tolerating a solid diet.  -Your final pathology results from surgery should be available around one week after surgery and the results will be relayed to you when available.  -Dr. Lahoma Crocker is the surgeon that assists your GYN Oncologist with surgery.  If you end up staying the night, the next day after your surgery you will either see Dr. Denman George, Dr. Berline Lopes, or Dr. Lahoma Crocker.  -FMLA forms can be faxed to [phone] and please  allow 5-7 business days for completion.  Pain Management After Surgery -You have been prescribed your pain medication and bowel regimen medications before surgery so that you can have these available when you are discharged from the hospital. The pain medication is for use ONLY AFTER surgery and a new prescription will not be given.   -Make sure that you have Tylenol at home to use on a regular basis after surgery for pain control.   -Review the attached handout on narcotic use and their risks and side effects.   Bowel Regimen -You have been prescribed Sennakot-S to take nightly to prevent constipation especially if you are taking the narcotic pain medication intermittently.  It is important to prevent constipation and drink adequate amounts of liquids. You can stop taking this medication when you are not taking pain medication and you are back on your normal bowel routine.  Risks of Surgery Risks of surgery are low but include bleeding, infection, damage to surrounding structures, re-operation, blood clots, and very rarely death.   Blood Transfusion Information (For the consent to be signed before surgery)  We will be checking your blood type before surgery so in case of emergencies, we will know what type of blood you would need.                                            WHAT IS A BLOOD TRANSFUSION?  A transfusion is the replacement of blood or some of its parts. Blood is made up of multiple cells which provide different functions.  Red blood cells carry oxygen and are used for blood loss replacement.  White blood cells fight against infection.  Platelets control bleeding.  Plasma helps clot blood.  Other blood products are available for specialized needs, such as hemophilia or other clotting disorders. BEFORE THE TRANSFUSION  Who gives blood for transfusions?   You may be able to donate blood to be used at a later date on yourself (autologous donation).  Relatives can be  asked to donate blood. This is generally not any safer than if you have received blood from a stranger. The same precautions are taken to ensure safety when a relative's blood is donated.  Healthy volunteers who are fully evaluated to make sure their blood is safe. This is blood bank blood. Transfusion therapy is the safest it has ever been in the practice of medicine. Before blood is taken from a donor, a complete history is taken to make sure that person has no history of diseases nor engages in risky social behavior (examples are intravenous drug use or sexual activity with multiple partners). The donor's travel history is screened to minimize risk of transmitting infections, such as malaria. The donated blood is tested for signs of infectious diseases, such as HIV and hepatitis. The blood is then tested to be sure it is  compatible with you in order to minimize the chance of a transfusion reaction. If you or a relative donates blood, this is often done in anticipation of surgery and is not appropriate for emergency situations. It takes many days to process the donated blood. RISKS AND COMPLICATIONS Although transfusion therapy is very safe and saves many lives, the main dangers of transfusion include:   Getting an infectious disease.  Developing a transfusion reaction. This is an allergic reaction to something in the blood you were given. Every precaution is taken to prevent this. The decision to have a blood transfusion has been considered carefully by your caregiver before blood is given. Blood is not given unless the benefits outweigh the risks.  AFTER SURGERY INSTRUCTIONS  Return to work: 4-6 weeks if applicable  Activity: 1. Be up and out of the bed during the day.  Take a nap if needed.  You may walk up steps but be careful and use the hand rail.  Stair climbing will tire you more than you think, you may need to stop part way and rest.   2. No lifting or straining for 6 weeks over 10  pounds. No pushing, pulling, straining for 6 weeks.  3. No driving for 1 week(s).  Do not drive if you are taking narcotic pain medicine and make sure that your reaction time has returned.   4. You can shower as soon as the next day after surgery. Shower daily.  Use your regular soap and water (not directly on the incision) and pat your incision(s) dry afterwards; don't rub.  No tub baths or submerging your body in water until cleared by your surgeon. If you have the soap that was given to you by pre-surgical testing that was used before surgery, you do not need to use it afterwards because this can irritate your incisions.   5. No sexual activity and nothing in the vagina for 8 weeks.  6. You may experience a small amount of clear drainage from your incisions, which is normal.  If the drainage persists, increases, or changes color please call the office.  7. Do not use creams, lotions, or ointments such as neosporin on your incisions after surgery until advised by your surgeon because they can cause removal of the dermabond glue on your incisions.    8. You may experience vaginal spotting after surgery or around the 6-8 week mark from surgery when the stitches at the top of the vagina begin to dissolve.  The spotting is normal but if you experience heavy bleeding, call our office.  9. Take Tylenol first for pain and only use narcotic pain medication for severe pain not relieved by the Tylenol.  Monitor your Tylenol intake to a max of 4,000 mg in a 24 hour period. You can alternate these medications after surgery.  Diet: 1. Low sodium Heart Healthy Diet is recommended but you are cleared to resume your normal (before surgery) diet after your procedure.  2. It is safe to use a laxative, such as Miralax or Colace, if you have difficulty moving your bowels. You have been prescribed Sennakot at bedtime every evening to keep bowel movements regular and to prevent constipation.    Wound Care: 1. Keep  clean and dry.  Shower daily.  Reasons to call the Doctor:  Fever - Oral temperature greater than 100.4 degrees Fahrenheit  Foul-smelling vaginal discharge  Difficulty urinating  Nausea and vomiting  Increased pain at the site of the incision that is unrelieved  with pain medicine.  Difficulty breathing with or without chest pain  New calf pain especially if only on one side  Sudden, continuing increased vaginal bleeding with or without clots.   Contacts: For questions or concerns you should contact:  Dr. Jeral Pinch at [phone]  Joylene John, NP at [phone]  After Hours: call [phone] and have the GYN Oncologist paged/contacted

## 2019-08-30 ENCOUNTER — Telehealth: Payer: Self-pay | Admitting: *Deleted

## 2019-08-30 NOTE — Telephone Encounter (Signed)
Fax surgical clearance forms to the patient's PCP, cardiology, and nephrology doctors

## 2019-08-30 NOTE — Telephone Encounter (Signed)
[place], MD  Chart reviewed as part of pre-operative protocol coverage. Because of Regina Porter's past medical history and time since last visit, he/she will require a follow-up visit in order to better assess preoperative cardiovascular risk.  Pre-op covering staff: - Please schedule appointment and call patient to inform them. - Please contact requesting surgeon's office via preferred method (i.e, phone, fax) to inform them of need for appointment prior to surgery.  If applicable, this message will also be routed to pharmacy pool and/or primary cardiologist for input on holding anticoagulant/antiplatelet agent as requested below so that this information is available at time of patient's appointment.   Deberah Pelton, NP  08/30/2019, 3:11 PM

## 2019-08-30 NOTE — Telephone Encounter (Signed)
Called Elan Mcelvain (daughter) and notified her of appt that I scheduled. Address and phone number. Verbalizes understanding.

## 2019-08-30 NOTE — Telephone Encounter (Signed)
   [facility] Pre-operative Risk Assessment    HEARTCARE STAFF: - Please ensure there is not already an duplicate clearance open for this procedure. - Under Visit Info/Reason for Call, type in Other and utilize the format Clearance MM/DD/YY or Clearance TBD. Do not use dashes or single digits. - If request is for dental extraction, please clarify the # of teeth to be extracted.  Request for surgical clearance:  1. What type of surgery is being performed? RALTH, BSO, SLN, POSSIBLE LND   2. When is this surgery scheduled? 09/09/2019   3. What type of clearance is required (medical clearance vs. Pharmacy clearance to hold med vs. Both)? Both  4. Are there any medications that need to be held prior to surgery and how long?ASA 81MG   5. Practice name and name of physician performing surgery? [facility], Dr Berline Lopes  6. What is the office phone number? [phone]   7.   What is the office fax number? [phone]  8.   Anesthesia type (None, local, MAC, general) ? General   Catherine Cubero L 08/30/2019, 2:46 PM  _________________________________________________________________   (provider comments below)

## 2019-08-30 NOTE — Telephone Encounter (Signed)
Called Regina Porter (daughter) she states that if we could call back to schedule appt  I have scheduled the only available appt at [facility] before the scheduled procedure, 09/07/2019@1 :30 PM w/Michele Bonnell Public, PA-C will notify daughter when I call back

## 2019-08-31 ENCOUNTER — Other Ambulatory Visit: Payer: Self-pay

## 2019-08-31 ENCOUNTER — Encounter: Payer: Self-pay | Admitting: Family

## 2019-08-31 ENCOUNTER — Ambulatory Visit (INDEPENDENT_AMBULATORY_CARE_PROVIDER_SITE_OTHER): Payer: Medicare Other | Admitting: Obstetrics & Gynecology

## 2019-08-31 ENCOUNTER — Encounter: Payer: Self-pay | Admitting: Obstetrics & Gynecology

## 2019-08-31 ENCOUNTER — Ambulatory Visit (INDEPENDENT_AMBULATORY_CARE_PROVIDER_SITE_OTHER): Payer: Medicare Other | Admitting: Family

## 2019-08-31 VITALS — BP 136/48 | HR 76 | Temp 97.5°F | Resp 16 | Ht 64.0 in | Wt 184.0 lb

## 2019-08-31 VITALS — BP 130/76

## 2019-08-31 DIAGNOSIS — E039 Hypothyroidism, unspecified: Secondary | ICD-10-CM

## 2019-08-31 DIAGNOSIS — I10 Essential (primary) hypertension: Secondary | ICD-10-CM | POA: Diagnosis not present

## 2019-08-31 DIAGNOSIS — G4700 Insomnia, unspecified: Secondary | ICD-10-CM

## 2019-08-31 DIAGNOSIS — Z09 Encounter for follow-up examination after completed treatment for conditions other than malignant neoplasm: Secondary | ICD-10-CM | POA: Diagnosis not present

## 2019-08-31 DIAGNOSIS — F411 Generalized anxiety disorder: Secondary | ICD-10-CM

## 2019-08-31 DIAGNOSIS — Z23 Encounter for immunization: Secondary | ICD-10-CM

## 2019-08-31 DIAGNOSIS — C541 Malignant neoplasm of endometrium: Secondary | ICD-10-CM

## 2019-08-31 DIAGNOSIS — Z01818 Encounter for other preprocedural examination: Secondary | ICD-10-CM | POA: Diagnosis not present

## 2019-08-31 DIAGNOSIS — K219 Gastro-esophageal reflux disease without esophagitis: Secondary | ICD-10-CM | POA: Diagnosis not present

## 2019-08-31 DIAGNOSIS — D649 Anemia, unspecified: Secondary | ICD-10-CM

## 2019-08-31 MED ORDER — TETANUS-DIPHTH-ACELL PERTUSSIS 5-2.5-18.5 LF-MCG/0.5 IM SUSP: 0.5000 mL | Freq: Once | INTRAMUSCULAR | 0 refills | Status: AC

## 2019-08-31 NOTE — Progress Notes (Signed)
    Regina Porter Nov 07, 1934 [id number]        85 y.o.  G5P5L5  RP: Postop HSC/Myosure excisions/D+C on 08/23/2019  HPI: Excellent postop healing.  No abdominopelvic pain.  No vaginal bleeding.  Normal vaginal secretions.  Urine/BMs normal.  No fever.  Patho: High grade Carcinoma.  Seen by [facility] Dr Berline Lopes, will have XI TLH/BSO/Staging on 09/09/2019.  CT chest/abdo/pelvis scheduled.   OB History  Gravida Para Term Preterm AB Living  5 5       5   SAB TAB Ectopic Multiple Live Births               # Outcome Date GA Lbr Len/2nd Weight Sex Delivery Anes PTL Lv  5 Para           4 Para           3 Para           2 Para           1 Para             Past medical history,surgical history, problem list, medications, allergies, family history and social history were all reviewed and documented in the EPIC chart.   Directed ROS with pertinent positives and negatives documented in the history of present illness/assessment and plan.  Exam:  Vitals:   08/31/19 1550  BP: 130/76   General appearance:  Normal  Abdomen: Normal  Gynecologic exam: Vulva normal.  Bimanual exam:     Assessment/Plan:  85 y.o. G5P5   1. Status post gynecological surgery, follow-up exam Very good postop healing.  No complication.  2. Carcinoma of endometrium Hosp [facility] ) Pathology: High-grade carcinoma of the endometrium.  Seen by Dr. Berline Lopes gyneco-oncologist, XI robotic TLH/BSO/staging scheduled for September 09, 2019.  CT scan of the chest, abdomen and pelvis scheduled preop.  Patient's questions answered.  [name] MD, 3:59 PM 08/31/2019

## 2019-08-31 NOTE — Progress Notes (Signed)
Provider: Winola Drum FNP-C   [name], NP  Patient Care Team: [name], NP as PCP - General (Geriatric Medicine) Jettie Booze, MD as PCP - Cardiology (Cardiology) Dustin Folks, MD as Consulting Physician (Hematology and Oncology) Esperanza Heir, MD as Consulting Physician (Obstetrics and Gynecology) Consuella Lose, MD as Consulting Physician (Neurosurgery) Deterding, Jeneen Rinks, MD as Consulting Physician (Nephrology)  Extended Emergency Contact Information Primary Emergency Contact: Gevena Barre [place] Phone: [phone] Relation: Daughter  Code Status: Full code  Goals of care: Advanced Directive information Advanced Directives 08/31/2019  Does Patient Have a Medical Advance Directive? No  Would patient like information on creating a medical advance directive? No - Patient declined     Chief Complaint  Patient presents with  . Medical Management of Chronic Issues    6 month follow up.  . Immunizations    Discusst the need for Tetanus Vaccine, and Influenza Vaccine.     HPI:  Pt is a 85 y.o. female seen today for 6 months follow up for  medical management of chronic diseases.she usually follows up with PCP Janann August.Also states here for pre-op clearance for robotic assisted hysterectomy with Dr.Kathrine Berline Lopes.she is scheduled for surgery on 09/09/2019.on chart review,she has a significant medical history of breast cancer.Has had persistent postmenopausal bleeding for some time and recent endometrial sampling showed grade 3 endometrial carcinoma.  Hypertension - on amlodipine and Losartan daily.on ASAshe denies any symptoms of hypotension.Blood pressure was high upon arrival to visit but rechecked after resting was normal.  Hypothyroidism - takes her levothyroxine 75 mcg tablet daily on an empty stomach.   GERD - states has not really had any symptoms.she denies any blood in the stool.   Anxiety -  on Buspar 10 mg tablet daily.denies any worsening symptoms or depression  Insomnia -  States Trazodone has been effective   Past Medical History:  Diagnosis Date  . Allergy    mild   . Anemia   . Anxiety   . Arthritis    pt states no pain   . Breast cancer ([place])    left   . Chronic kidney disease    ckd stage 4 per lov dr deterding 08-03-2018 on chart  . Dementia ([place])   . Depression   . Dyspnea    W/ PAIN   . Gastroesophageal reflux disease   . Headache    TO SEE NEURO MD  . Heart murmur    mild per pt.  . Heartburn   . Hypertension   . Hypothyroidism   . Leg cramps   . Post-menopause bleeding   . Restless leg syndrome 10/07/2014  . Sleep apnea    Past Surgical History:  Procedure Laterality Date  . back sugery   2019   lower back   . BREAST LUMPECTOMY    . BREAST SURGERY Left 2013   Lumpectomy  radiation done no chemo  . CATARACT EXTRACTION, BILATERAL    . DILATATION & CURETTAGE/HYSTEROSCOPY WITH MYOSURE N/A 08/23/2019   Procedure: [place];  Surgeon: [name], MD;  Location: [facility];  Service: Gynecology;  Laterality: N/A;  request to follow 2nd case in [place] block requests one hour  . hysteroscopy biopsy      Allergies  Allergen Reactions  . Shellfish Allergy Swelling and Rash    MOUTH  . Neomy-Bacit-Polymyx-Pramoxine     UNSPECIFIED REACTION  Not sure which antibiotic she has a reaction  to    Allergies as of 08/31/2019      Reactions   Shellfish Allergy Swelling, Rash   MOUTH   Neomy-bacit-polymyx-pramoxine    UNSPECIFIED REACTION  Not sure which antibiotic she has a reaction to      Medication List       Accurate as of August 31, 2019  2:46 PM. If you have any questions, ask your nurse or doctor.        acetaminophen 500 MG tablet Commonly known as: TYLENOL Take 1,000 mg by mouth every 6 (six) hours as needed for moderate pain.   amLODipine 10 MG tablet Commonly  known as: NORVASC TAKE 1 TABLET BY MOUTH EVERY DAY   aspirin 81 MG EC tablet Take 81 mg by mouth daily.   bumetanide 2 MG tablet Commonly known as: BUMEX Take 2 mg by mouth daily.   busPIRone 10 MG tablet Commonly known as: BUSPAR TAKE 1 TABLET BY MOUTH TWICE DAILY .DX F41.9 APPT IS OVERDUE   Cod Liver Oil 1000 MG Caps Take 1,000 mg by mouth 2 (two) times daily.   donepezil 5 MG tablet Commonly known as: ARICEPT TAKE 1 TABLET BY MOUTH EVERYDAY AT BEDTIME DX: G31.84   EYE DROPS OP Place 1 drop into both eyes daily as needed (dry eyes).   hydrALAZINE 100 MG tablet Commonly known as: APRESOLINE TAKE 1 TABLET (100 MG TOTAL) BY MOUTH 3 (THREE) TIMES DAILY.   levothyroxine 75 MCG tablet Commonly known as: SYNTHROID TAKE ONE TABLET BY MOUTH ONCE DAILY 30 MINUTES BEFORE BREAKFAST FOR THYROID   losartan 100 MG tablet Commonly known as: COZAAR TAKE 1 TABLET BY MOUTH EVERYDAY AT BEDTIME   megestrol 40 MG tablet Commonly known as: MEGACE Take 1 tablet (40 mg total) by mouth 2 (two) times daily.   metoprolol succinate 100 MG 24 hr tablet Commonly known as: TOPROL-XL TAKE 1 TABLET BY MOUTH EVERYDAY AT BEDTIME   MULTIVITAMIN ADULT PO Take 1 tablet by mouth daily.   senna-docusate 8.6-50 MG tablet Commonly known as: Senokot-S Take 2 tablets by mouth at bedtime. For AFTER surgery, do not take if having diarrhea   Tdap 5-2.5-18.5 LF-MCG/0.5 injection Commonly known as: BOOSTRIX Inject 0.5 mLs into the muscle once for 1 dose.   traMADol 50 MG tablet Commonly known as: ULTRAM Take 1 tablet (50 mg total) by mouth every 12 (twelve) hours as needed for severe pain. For AFTER surgery, do not take and drive   traZODone 50 MG tablet Commonly known as: DESYREL TAKE 1 TABLET BY MOUTH EVERYDAY AT BEDTIME       Review of Systems  Constitutional: Negative for appetite change, chills, fatigue and fever.  HENT: Positive for hearing loss. Negative for congestion, rhinorrhea, sinus  pressure, sinus pain, sneezing, sore throat and trouble swallowing.   Eyes: Negative for discharge, redness, itching and visual disturbance.       Wears reading glasses   Respiratory: Negative for cough, chest tightness, shortness of breath and wheezing.   Cardiovascular: Negative for chest pain, palpitations and leg swelling.  Gastrointestinal: Negative for abdominal distention, abdominal pain, constipation, diarrhea, nausea and vomiting.  Endocrine: Negative for cold intolerance, heat intolerance, polydipsia, polyphagia and polyuria.  Genitourinary: Positive for frequency and vaginal bleeding. Negative for difficulty urinating, dysuria, flank pain, urgency and vaginal discharge.       Gets up 3-4 times during the night   Musculoskeletal: Positive for gait problem. Negative for arthralgias, back pain, joint swelling and myalgias.  Uses cane when out of the house   Skin: Negative for color change, pallor and rash.  Neurological: Negative for dizziness, seizures, syncope, speech difficulty, weakness, light-headedness, numbness and headaches.  Psychiatric/Behavioral: Negative for agitation, behavioral problems and sleep disturbance.    Immunization History  Administered Date(s) Administered  . Influenza, High Dose Seasonal PF 01/22/2017, 10/14/2017  . Influenza,inj,Quad PF,6+ Mos 10/26/2014, 09/11/2015  . Influenza-Unspecified 12/03/2013, 10/26/2014  . PFIZER SARS-COV-2 Vaccination 03/20/2019, 04/10/2019  . Pneumococcal Conjugate-13 10/26/2014  . Pneumococcal Polysaccharide-23 12/03/2013, 11/13/2015  . Tdap 01/14/2009  . Zoster 12/03/2013   Pertinent  Health Maintenance Due  Topic Date Due  . INFLUENZA VACCINE  08/15/2019  . MAMMOGRAM  10/08/2019  . COLONOSCOPY  10/08/2021  . DEXA SCAN  Completed  . PNA vac Low Risk Adult  Completed   Fall Risk  08/31/2019 08/09/2019 05/06/2019 08/26/2018 08/06/2018  Falls in the past year? 0 0 0 0 0  Number falls in past yr: 0 0 0 0 0  Injury  with Fall? 0 0 0 0 0    Vitals:   08/31/19 1314 08/31/19 1417  BP: (!) 160/50 (!) 136/48  Pulse: 76   Resp: 16   Temp: (!) 97.5 F (36.4 C)   SpO2: 98%   Weight: 184 lb (83.5 kg)   Height: 5' 4" (1.626 m)    Body mass index is 31.58 kg/m. Physical Exam Vitals reviewed.  Constitutional:      General: She is not in acute distress.    Appearance: She is obese. She is not ill-appearing.  HENT:     Head: Normocephalic.     Right Ear: Tympanic membrane, ear canal and external ear normal. There is no impacted cerumen.     Left Ear: Tympanic membrane, ear canal and external ear normal. There is no impacted cerumen.     Nose: Congestion present. No rhinorrhea.     Mouth/Throat:     Mouth: Mucous membranes are moist.     Pharynx: Oropharynx is clear. No oropharyngeal exudate or posterior oropharyngeal erythema.  Eyes:     General: No scleral icterus.       Right eye: No discharge.        Left eye: No discharge.     Conjunctiva/sclera: Conjunctivae normal.     Pupils: Pupils are equal, round, and reactive to light.  Cardiovascular:     Rate and Rhythm: Normal rate and regular rhythm.     Pulses: Normal pulses.     Heart sounds: Normal heart sounds. No murmur heard.  No friction rub. No gallop.   Pulmonary:     Effort: Pulmonary effort is normal. No respiratory distress.     Breath sounds: Normal breath sounds. No wheezing, rhonchi or rales.  Chest:     Chest wall: No tenderness.  Abdominal:     General: Bowel sounds are normal. There is no distension.     Palpations: Abdomen is soft. There is no mass.     Tenderness: There is no abdominal tenderness. There is no right CVA tenderness, left CVA tenderness, guarding or rebound.  Musculoskeletal:        General: No swelling or tenderness.     Cervical back: Normal range of motion. No rigidity or tenderness.     Right lower leg: No edema.     Left lower leg: No edema.     Comments: Unsteady gait walks with a cane     Lymphadenopathy:     Cervical: No cervical adenopathy.  Skin:    General: Skin is warm and dry.     Coloration: Skin is not pale.     Findings: No bruising, erythema or rash.  Neurological:     Mental Status: She is alert and oriented to person, place, and time.     Cranial Nerves: No cranial nerve deficit.     Sensory: No sensory deficit.     Motor: No weakness.     Coordination: Coordination normal.     Gait: Gait abnormal.  Psychiatric:        Mood and Affect: Mood normal.        Behavior: Behavior normal.        Thought Content: Thought content normal.        Judgment: Judgment normal.    Labs reviewed: Recent Labs    08/23/19 1029  NA 143  K 4.3  CL 106  GLUCOSE 95  BUN 21  CREATININE 2.10*    Recent Labs    09/24/18 1031 09/24/18 1031 03/10/19 1104 08/23/19 1025 08/23/19 1029  WBC 4.2  --  4.1 6.2  --   NEUTROABS 1.7  --   --   --   --   HGB 12.0   < > 11.1* 10.5* 12.6  HCT 35.2*   < > 32.8* 32.8* 37.0  MCV 92.6  --  92.9 92.4  --   PLT 261.0  --  278 439*  --    < > = values in this interval not displayed.   Lab Results  Component Value Date   TSH 0.37 (L) 08/06/2018   No results found for: HGBA1C Lab Results  Component Value Date   CHOL 146 08/06/2018   HDL 42 (L) 08/06/2018   LDLCALC 78 08/06/2018   TRIG 164 (H) 08/06/2018   CHOLHDL 3.5 08/06/2018    Significant Diagnostic Results in last 30 days:  No results found.  Assessment/Plan 1. Essential hypertension B/p was elevated on arrival but went down after resting then rechecked. - continue on amlodipine 10 mg tablet daily,Hydralazine 100 mg tablet three times daily and losartan 100 mg tablet daily. - on ASA EC 81 mg tablet daily - EKG 12-Lead - Lipid panel - CMP with eGFR([facility]) - CBC with Differential/Platelet  2. Hypothyroidism, unspecified type Lab Results  Component Value Date   TSH 0.37 (L) 08/06/2018  - continue on levothyroxine 75 mcg tablet daily on empty stomach.  -  TSH  3. Generalized anxiety disorder Stable. - continue on Buspar 10 mg tablet daily   4. Insomnia, unspecified type Trazodone effective.   5. Anemia, unspecified type Latest Hgb 12.6 will recheck CBC today for pre-op.   6. Gastroesophageal reflux disease without esophagitis Asymptomatic.   7. Need for Tdap vaccination Advised to get Tdap vaccine at her pharmacy.script e-scribed.  - Tdap (BOOSTRIX) 5-2.5-18.5 LF-MCG/0.5 injection; Inject 0.5 mLs into the muscle once for 1 dose.  Dispense: 0.5 mL; Refill: 0  8. Pre-op examination scheduled for Hysterectomy robotic assisted hysterectomy with Dr.Kathrine Berline Lopes 09/09/2019.Awaiting pre-op medical clearance form to be sent from Dr.Tucker's office then will signed and fax back. - EKG 12-Lead done today indicates Aterial rhythm with first degree AV-block.previous EKG reviewed also showed   first degree AV-block.Has appointment with Cardiologist for pre-op clearance. - Not on anticoagulant but on Asprin 81 EC tablet will need to hold before surgery.   9. Endometrial cancer determined by uterine biopsy [facility]) Reports vaginal bleeding reason for upcoming Hysterectomy surgery Family/ staff Communication: Reviewed plan  of care with patient and daughter   Labs/tests ordered:   - EKG 12-Lead - Lipid panel - CMP with eGFR([facility]) - CBC with Differential/Platelet - TSH  Next Appointment : 4 months for medical management of chronic issues with PCP Dani Gobble   Sandrea Hughs, NP

## 2019-08-31 NOTE — Patient Instructions (Signed)
-   lab worker done today will call you with results. - Awaiting pre-op clearance will fill out then fax to specialist. - follow up with Cardiologist as schedule

## 2019-09-01 ENCOUNTER — Ambulatory Visit (HOSPITAL_COMMUNITY)
Admission: RE | Admit: 2019-09-01 | Discharge: 2019-09-01 | Disposition: A | Discharge status: Home / Self Care | Payer: Medicare Other | Source: Ambulatory Visit | Attending: Gynecologic Oncology | Admitting: Gynecologic Oncology

## 2019-09-01 ENCOUNTER — Ambulatory Visit: Payer: Medicare Other | Admitting: Family

## 2019-09-01 DIAGNOSIS — N852 Hypertrophy of uterus: Secondary | ICD-10-CM | POA: Diagnosis not present

## 2019-09-01 DIAGNOSIS — I2510 Atherosclerotic heart disease of native coronary artery without angina pectoris: Secondary | ICD-10-CM | POA: Diagnosis not present

## 2019-09-01 DIAGNOSIS — C541 Malignant neoplasm of endometrium: Secondary | ICD-10-CM | POA: Insufficient documentation

## 2019-09-01 DIAGNOSIS — J984 Other disorders of lung: Secondary | ICD-10-CM | POA: Diagnosis not present

## 2019-09-01 DIAGNOSIS — I700 Atherosclerosis of aorta: Secondary | ICD-10-CM | POA: Diagnosis not present

## 2019-09-01 DIAGNOSIS — C539 Malignant neoplasm of cervix uteri, unspecified: Secondary | ICD-10-CM | POA: Diagnosis not present

## 2019-09-01 DIAGNOSIS — K449 Diaphragmatic hernia without obstruction or gangrene: Secondary | ICD-10-CM | POA: Diagnosis not present

## 2019-09-01 LAB — LIPID PANEL
Cholesterol: 157 mg/dL (ref <200)
HDL: 35 mg/dL — ABNORMAL LOW (ref >=50)
LDL Cholesterol (Calc): 102 mg/dL (calc) — ABNORMAL HIGH
Non-HDL Cholesterol (Calc): 122 mg/dL (calc) (ref <130)
Total CHOL/HDL Ratio: 4.5 (calc) (ref <5.0)
Triglycerides: 100 mg/dL (ref <150)

## 2019-09-01 LAB — COMPLETE METABOLIC PANEL WITH GFR
AG Ratio: 1.2 (calc) (ref 1.0–2.5)
ALT: 15 U/L (ref 6–29)
AST: 26 U/L (ref 10–35)
Albumin: 3.7 g/dL (ref 3.6–5.1)
Alkaline phosphatase (APISO): 37 U/L (ref 37–153)
BUN/Creatinine Ratio: 13 (calc) (ref 6–22)
BUN: 24 mg/dL (ref 7–25)
CO2: 23 mmol/L (ref 20–32)
Calcium: 9 mg/dL (ref 8.6–10.4)
Chloride: 107 mmol/L (ref 98–110)
Creat: 1.9 mg/dL — ABNORMAL HIGH (ref 0.60–0.88)
GFR, Est African American: 28 mL/min/{1.73_m2} — ABNORMAL LOW (ref >=60)
GFR, Est Non African American: 24 mL/min/{1.73_m2} — ABNORMAL LOW (ref >=60)
Globulin: 3.2 g/dL (calc) (ref 1.9–3.7)
Glucose, Bld: 95 mg/dL (ref 65–99)
Potassium: 4.7 mmol/L (ref 3.5–5.3)
Sodium: 139 mmol/L (ref 135–146)
Total Bilirubin: 0.3 mg/dL (ref 0.2–1.2)
Total Protein: 6.9 g/dL (ref 6.1–8.1)

## 2019-09-01 LAB — CBC WITH DIFFERENTIAL/PLATELET
Absolute Monocytes: 470 cells/uL (ref 200–950)
Basophils Absolute: 20 cells/uL (ref 0–200)
Basophils Relative: 0.4 %
Eosinophils Absolute: 90 cells/uL (ref 15–500)
Eosinophils Relative: 1.8 %
HCT: 29.3 % — ABNORMAL LOW (ref 35.0–45.0)
Hemoglobin: 9.7 g/dL — ABNORMAL LOW (ref 11.7–15.5)
Lymphs Abs: 1355 cells/uL (ref 850–3900)
MCH: 29.7 pg (ref 27.0–33.0)
MCHC: 33.1 g/dL (ref 32.0–36.0)
MCV: 89.6 fL (ref 80.0–100.0)
MPV: 10.3 fL (ref 7.5–12.5)
Monocytes Relative: 9.4 %
Neutro Abs: 3065 cells/uL (ref 1500–7800)
Neutrophils Relative %: 61.3 %
Platelets: 282 10*3/uL (ref 140–400)
RBC: 3.27 10*6/uL — ABNORMAL LOW (ref 3.80–5.10)
RDW: 14 % (ref 11.0–15.0)
Total Lymphocyte: 27.1 %
WBC: 5 10*3/uL (ref 3.8–10.8)

## 2019-09-01 LAB — TSH: TSH: 0.47 mIU/L (ref 0.40–4.50)

## 2019-09-01 NOTE — Progress Notes (Signed)
Cardiology Office Note    Date:  09/07/2019   ID:  Regina, Porter 1934/09/23, MRN [id number]  PCP:  [name], NP  Cardiologist: Larae Grooms, MD EPS: None  Chief Complaint  Patient presents with  . Pre-op Exam    History of Present Illness:  Regina Porter is a 85 y.o. female with history of hypertension, chronic diastolic CHF, palpitations monitor showed no arrhythmias in 2018 carotid Dopplers minimal plaque echo normal LVEF with diastolic dysfunction, CKD stage IV, dementia  Dr. Irish Lack 03/16/2019 and was doing well.  Patient added onto my schedule to be cleared for robotic assisted total hysterectomy with bilateral oophorectomy by Dr. Berline Lopes 09/09/2019 for cancer.  Patient saw PCP 08/31/2019 and blood pressure was up on arrival.  EKG yesterday showed normal sinus rhythm with first-degree AV block.  The readout showed atrial arrhythmia but it is normal sinus rhythm on review.  Patient denies chest pain, palpitations, shortness of breath, edema. BP up today and yesterday. Normally 145-155/77 at home. Patient isn't very active but no cardiac problems.   Past Medical History:  Diagnosis Date  . Allergy    mild   . Anemia   . Arthritis    pt states no pain   . Breast cancer ([place])    left   . Chronic kidney disease    ckd stage 4 per lov dr deterding 08-03-2018 on chart  . Dementia ([place])   . Depression   . Gastroesophageal reflux disease   . Heart murmur   . Heartburn   . Hypertension   . Hypothyroidism   . Leg cramps   . Post-menopause bleeding   . Restless leg syndrome 10/07/2014  . Sleep apnea    cpap not in use     Past Surgical History:  Procedure Laterality Date  . back sugery   2019   lower back   . BREAST LUMPECTOMY    . BREAST SURGERY Left 2013   Lumpectomy  radiation done no chemo  . CATARACT EXTRACTION, BILATERAL    . DILATATION & CURETTAGE/HYSTEROSCOPY WITH MYOSURE N/A 08/23/2019   Procedure: [place];  Surgeon: [name], MD;  Location: [facility];  Service: Gynecology;  Laterality: N/A;  request to follow 2nd case in [place] block requests one hour  . hysteroscopy biopsy      Current Medications: Current Meds  Medication Sig  . acetaminophen (TYLENOL) 500 MG tablet Take 1,000 mg by mouth every 6 (six) hours as needed for moderate pain.   Marland Kitchen amLODipine (NORVASC) 10 MG tablet TAKE 1 TABLET BY MOUTH EVERY DAY  . aspirin 81 MG EC tablet Take 81 mg by mouth daily.   . bumetanide (BUMEX) 2 MG tablet Take 2 mg by mouth daily.  . busPIRone (BUSPAR) 10 MG tablet TAKE 1 TABLET BY MOUTH TWICE DAILY .DX F41.9 APPT IS OVERDUE  . Carboxymethylcellulose Sodium (EYE DROPS OP) Place 1 drop into both eyes daily as needed (dry eyes).   Marland Kitchen Cod Liver Oil 1000 MG CAPS Take 1,000 mg by mouth 2 (two) times daily.   Marland Kitchen donepezil (ARICEPT) 5 MG tablet TAKE 1 TABLET BY MOUTH EVERYDAY AT BEDTIME DX: G31.84  . hydrALAZINE (APRESOLINE) 100 MG tablet TAKE 1 TABLET (100 MG TOTAL) BY MOUTH 3 (THREE) TIMES DAILY.  Marland Kitchen levothyroxine (SYNTHROID) 75 MCG tablet TAKE ONE TABLET BY MOUTH ONCE DAILY 30 MINUTES BEFORE BREAKFAST FOR THYROID  . losartan (COZAAR) 100 MG tablet TAKE  1 TABLET BY MOUTH EVERYDAY AT BEDTIME  . megestrol (MEGACE) 40 MG tablet Take 1 tablet (40 mg total) by mouth 2 (two) times daily.  . metoprolol succinate (TOPROL-XL) 100 MG 24 hr tablet Take 100 mg by mouth in the morning and at bedtime. Take with or immediately following a meal.  . Multiple Vitamin (MULTIVITAMIN ADULT PO) Take 1 tablet by mouth daily.  Marland Kitchen senna-docusate (SENOKOT-S) 8.6-50 MG tablet Take 2 tablets by mouth at bedtime. For AFTER surgery, do not take if having diarrhea  . traMADol (ULTRAM) 50 MG tablet Take 1 tablet (50 mg total) by mouth every 12 (twelve) hours as needed for severe pain. For AFTER surgery, do not take and drive  . traZODone (DESYREL) 50 MG tablet TAKE 1 TABLET BY MOUTH EVERYDAY AT  BEDTIME     Allergies:   Shellfish allergy and Neomy-bacit-polymyx-pramoxine   Social History   Socioeconomic History  . Marital status: Widowed    Spouse name: Not on file  . Number of children: 5  . Years of education: 34  . Highest education level: Not on file  Occupational History  . Occupation: Retired  Tobacco Use  . Smoking status: Former Smoker    Packs/day: 0.10    Years: 20.00    Pack years: 2.00    Quit date: 2008    Years since quitting: 13.6  . Smokeless tobacco: Never Used  . Tobacco comment: Quit at  age 86  Vaping Use  . Vaping Use: Never used  Substance and Sexual Activity  . Alcohol use: No    Alcohol/week: 0.0 standard drinks  . Drug use: No  . Sexual activity: Not Currently  Other Topics Concern  . Not on file  Social History Narrative   Diet?       Do you drink/eat things with caffeine? yes      Marital status?                widow                    What year were you married?      Do you live in a house, apartment, assisted living, condo, trailer, etc.? house      Is it one or more stories? yes      How many persons live in your home? 2      Do you have any pets in your home? (please list) no      Current or past profession:      Do you exercise?         no                             Type & how often? none      Do you have a living will? no   Do you have a DNR form?  no                                If not, do you want to discuss one? no      Do you have signed POA/HPOA for forms?    Social Determinants of Health   Financial Resource Strain:   . Difficulty of Paying Living Expenses: Not on file  Food Insecurity:   . Worried About Charity fundraiser in the Last Year: Not on file  . Ran  Out of Food in the Last Year: Not on file  Transportation Needs:   . Lack of Transportation (Medical): Not on file  . Lack of Transportation (Non-Medical): Not on file  Physical Activity:   . Days of Exercise per Week: Not on file  . Minutes  of Exercise per Session: Not on file  Stress:   . Feeling of Stress : Not on file  Social Connections:   . Frequency of Communication with Friends and Family: Not on file  . Frequency of Social Gatherings with Friends and Family: Not on file  . Attends Religious Services: Not on file  . Active Member of Clubs or Organizations: Not on file  . Attends Archivist Meetings: Not on file  . Marital Status: Not on file     Family History:  The patient's family history includes Arthritis in her mother; Breast cancer in her paternal aunt and sister; Heart disease in her father and mother; Hypertension in her father and mother; Lung cancer in her brother.   ROS:   Please see the history of present illness.    ROS All other systems reviewed and are negative.   PHYSICAL EXAM:   VS:  BP (!) 160/68   Pulse 76   Ht 5\' 5"  (1.651 m)   Wt 182 lb (82.6 kg)   SpO2 98%   BMI 30.29 kg/m   Physical Exam  GEN: Well nourished, well developed, in no acute distress  Neck: no JVD, carotid bruits, or masses Cardiac:RRR; 1/6 systolic murmur at the left sternal border Respiratory:  clear to auscultation bilaterally, normal work of breathing GI: soft, nontender, nondistended, + BS Ext: without cyanosis, clubbing, or edema, Good distal pulses bilaterally Neuro:  Alert and Oriented x 3, Psych: euthymic mood, full affect  Wt Readings from Last 3 Encounters:  09/07/19 182 lb (82.6 kg)  09/06/19 182 lb (82.6 kg)  08/31/19 184 lb (83.5 kg)      Studies/Labs Reviewed:   EKG:  EKG is not ordered today.  The ekg reviewed from 08/31/2019 normal sinus rhythm with first-degree AV block nonspecific ST-T wave changes, unchanged from prior tracings Recent Labs: 08/31/2019: TSH 0.47 09/06/2019: ALT 15; BUN 25; Creatinine, Ser 2.05; Hemoglobin 9.4; Platelets 299; Potassium 4.6; Sodium 140   Lipid Panel    Component Value Date/Time   CHOL 157 08/31/2019 1442   TRIG 100 08/31/2019 1442   HDL 35 (L)  08/31/2019 1442   CHOLHDL 4.5 08/31/2019 1442   VLDL 25 08/10/2015 1013   LDLCALC 102 (H) 08/31/2019 1442    Additional studies/ records that were reviewed today include:  2D echo 05/07/2016 Study Conclusions   - Left ventricle: The cavity size was normal. Wall thickness was    increased in a pattern of mild LVH. Systolic function was    vigorous. The estimated ejection fraction was in the range of 65%    to 70%. Wall motion was normal; there were no regional wall    motion abnormalities. Doppler parameters are consistent with    abnormal left ventricular relaxation (grade 1 diastolic    dysfunction).  - Aortic valve: Mildly to moderately calcified annulus.  - Mitral valve: There was mild regurgitation.   Holter monitor 4/24/2018Study Highlights   Normal sinus rhythm with prlonged PR interval.  No sustained arrhythmia.  No arrhythmia noted with her chest pain.   1 to 39% bilateral carotid stenosis on Dopplers 05/13/2016   ASSESSMENT:    1. Preoperative clearance   2.  Chronic diastolic CHF (congestive heart failure) ([place])   3. Essential hypertension   4. Nonspecific abnormal electrocardiogram (ECG) (EKG)   5. Palpitations   6. Heart murmur      PLAN:  In order of problems listed above:  Preoperative clearance before undergoing robotic assisted total hysterectomy with bilateral oophorectomy for possible endometrial cancer by Dr. Berline Lopes 09/09/2019.  Patient has history of hypertension, palpitations, chronic diastolic CHF.  Echo in 2018 with normal LV function diastolic dysfunction, Holter monitor no arrhythmias.  Patient's cardiac risk index is 6.6% because of creatinine of 2.05 and prior diastolic CHF but  METs is over 5.  She has no cardiac complaints.  Can proceed with surgery without any further cardiac work-up.  Schedule echo for follow-up of systolic murmur of MR.  This does not have to be done before surgery.  According to the Revised Cardiac Risk Index (RCRI), her  Perioperative Risk of Major Cardiac Event is (%): 6.6  Her Functional Capacity in METs is: 5.07 according to the [facility] Activity Status Index (DASI).   Essential hypertension blood pressure up today but usually stable at home.  No changes.  She is anxious about upcoming surgery.  Chronic diastolic CHF compensated.  Palpitations no recent palpitations.  CKD stage IV creatinine 2.05 09/06/2019  Medication Adjustments/Labs and Tests Ordered: Current medicines are reviewed at length with the patient today.  Concerns regarding medicines are outlined above.  Medication changes, Labs and Tests ordered today are listed in the Patient Instructions below. Patient Instructions  Medication Instructions:  Your physician recommends that you continue on your current medications as directed. Please refer to the Current Medication list given to you today.  *If you need a refill on your cardiac medications before your next appointment, please call your pharmacy*   Lab Work: None  If you have labs (blood work) drawn today and your tests are completely normal, you will receive your results only by: Marland Kitchen MyChart Message (if you have MyChart) OR . A paper copy in the mail If you have any lab test that is abnormal or we need to change your treatment, we will call you to review the results.   Testing/Procedures: Your physician has requested that you have an echocardiogram. Echocardiography is a painless test that uses sound waves to create images of your heart. It provides your doctor with information about the size and shape of your heart and how well your heart's chambers and valves are working. This procedure takes approximately one hour. There are no restrictions for this procedure.     Follow-Up: At [facility], you and your health needs are our priority.  As part of our continuing mission to provide you with exceptional heart care, we have created designated Provider Care Teams.  These Care Teams  include your primary Cardiologist (physician) and Advanced Practice Providers (APPs -  Physician Assistants and Nurse Practitioners) who all work together to provide you with the care you need, when you need it.  We recommend signing up for the patient portal called "MyChart".  Sign up information is provided on this After Visit Summary.  MyChart is used to connect with patients for Virtual Visits (Telemedicine).  Patients are able to view lab/test results, encounter notes, upcoming appointments, etc.  Non-urgent messages can be sent to your provider as well.   To learn more about what you can do with MyChart, go to [email or web address].    Your next appointment:   12 month(s)  The format for your next appointment:  In Person  Provider:   You may see Larae Grooms, MD or one of the following Advanced Practice Providers on your designated Care Team:    Melina Copa, PA-C  Ermalinda Barrios, PA-C    Other Instructions None     Signed, Ermalinda Barrios, PA-C  09/07/2019 2:07 PM    [address], [place], [place]  [address] Phone: [phone]; Fax: [phone]

## 2019-09-02 ENCOUNTER — Telehealth: Payer: Self-pay | Admitting: *Deleted

## 2019-09-02 NOTE — Telephone Encounter (Signed)
Called and checked the status of the nephrology clearance on the patient. Per the office patient hasn't been seen in over a year. Appt scheduled tomorrow for the patient to have surgical clearance. Called and gave the daughter the above information

## 2019-09-03 ENCOUNTER — Telehealth: Payer: Self-pay | Admitting: Gynecologic Oncology

## 2019-09-03 DIAGNOSIS — N189 Chronic kidney disease, unspecified: Secondary | ICD-10-CM | POA: Diagnosis not present

## 2019-09-03 DIAGNOSIS — N184 Chronic kidney disease, stage 4 (severe): Secondary | ICD-10-CM | POA: Diagnosis not present

## 2019-09-03 DIAGNOSIS — I129 Hypertensive chronic kidney disease with stage 1 through stage 4 chronic kidney disease, or unspecified chronic kidney disease: Secondary | ICD-10-CM | POA: Diagnosis not present

## 2019-09-03 DIAGNOSIS — N2581 Secondary hyperparathyroidism of renal origin: Secondary | ICD-10-CM | POA: Diagnosis not present

## 2019-09-03 DIAGNOSIS — D631 Anemia in chronic kidney disease: Secondary | ICD-10-CM | POA: Diagnosis not present

## 2019-09-03 NOTE — Progress Notes (Signed)
DUE TO COVID-19 ONLY ONE VISITOR IS ALLOWED TO COME WITH YOU AND STAY IN THE WAITING ROOM ONLY DURING PRE OP AND PROCEDURE DAY OF SURGERY. THE 1 VISITOR  MAY VISIT WITH YOU AFTER SURGERY IN YOUR PRIVATE ROOM DURING VISITING HOURS ONLY!  YOU NEED TO HAVE A COVID 19 TEST ON_______ @_______ , THIS TEST MUST BE DONE BEFORE SURGERY,  COVID TESTING SITE [address] [place] [address], IT IS ON THE RIGHT GOING OUT [address] APPROXIMATELY  2 MINUTES PAST [employer] ON THE RIGHT. ONCE YOUR COVID TEST IS COMPLETED,  PLEASE BEGIN THE QUARANTINE INSTRUCTIONS AS OUTLINED IN YOUR HANDOUT.                Regina Porter  09/03/2019   Your procedure is scheduled on:  09/09/2019   Report to [facility] Main  Entrance   Report to admitting at    0530 AM     Call this number if you have problems the morning of surgery [phone]    REMEMBER: NO  SOLID FOOD CANDY OR GUM AFTER MIDNIGHT. CLEAR LIQUIDS UNTIL         . NOTHING BY MOUTH EXCEPT CLEAR LIQUIDS     0430am .   EAt a light diet the day before surgery.  Avoid gas producing foods.  May have clear liquids from 12 midnite until 0430am morning of surgery then nothing by mouth.       Marland Kitchen      CLEAR LIQUID DIET   Foods Allowed                                                                    Coffee and tea, regular and decaf                            Fruit ices (not with fruit pulp)                                      Iced Popsicles                                    Carbonated beverages, regular and diet                                    Cranberry, grape and apple juices Sports drinks like Gatorade Lightly seasoned clear broth or consume(fat free) Sugar, honey syrup ___________________________________________________________________      BRUSH YOUR TEETH MORNING OF SURGERY AND RINSE YOUR MOUTH OUT, NO CHEWING GUM CANDY OR MINTS.     Take these medicines the morning of surgery with A SIP OF WATER:  Amlodipine,  buspar, Hydralazine, Synthroid                                 You may not have any metal on your body including hair pins and  piercings  Do not wear jewelry, make-up, lotions, powders or perfumes, deodorant             Do not wear nail polish on your fingernails.  Do not shave  48 hours prior to surgery.          Do not bring valuables to the hospital. [place].  Contacts, dentures or bridgework may not be worn into surgery.  Leave suitcase in the car. After surgery it may be brought to your room.     Patients discharged the day of surgery will not be allowed to drive home. IF YOU ARE HAVING SURGERY AND GOING HOME THE SAME DAY, YOU MUST HAVE AN ADULT TO DRIVE YOU HOME AND BE WITH YOU FOR 24 HOURS. YOU MAY GO HOME BY TAXI OR UBER OR ORTHERWISE, BUT AN ADULT MUST ACCOMPANY YOU HOME AND STAY WITH YOU FOR 24 HOURS.  Name and phone number of your driver:               Please read over the following fact sheets you were given: _____________________________________________________________________  [facility] - Preparing for Surgery Before surgery, you can play an important role.  Because skin is not sterile, your skin needs to be as free of germs as possible.  You can reduce the number of germs on your skin by washing with CHG (chlorahexidine gluconate) soap before surgery.  CHG is an antiseptic cleaner which kills germs and bonds with the skin to continue killing germs even after washing. Please DO NOT use if you have an allergy to CHG or antibacterial soaps.  If your skin becomes reddened/irritated stop using the CHG and inform your nurse when you arrive at Short Stay. Do not shave (including legs and underarms) for at least 48 hours prior to the first CHG shower.  You may shave your face/neck. Please follow these instructions carefully:  1.  Shower with CHG Soap the night before surgery and the  morning of Surgery.  2.  If you  choose to wash your hair, wash your hair first as usual with your  normal  shampoo.  3.  After you shampoo, rinse your hair and body thoroughly to remove the  shampoo.                           4.  Use CHG as you would any other liquid soap.  You can apply chg directly  to the skin and wash                       Gently with a scrungie or clean washcloth.  5.  Apply the CHG Soap to your body ONLY FROM THE NECK DOWN.   Do not use on face/ open                           Wound or open sores. Avoid contact with eyes, ears mouth and genitals (private parts).                       Wash face,  Genitals (private parts) with your normal soap.             6.  Wash thoroughly, paying special attention to the area where your surgery  will be  performed.  7.  Thoroughly rinse your body with warm water from the neck down.  8.  DO NOT shower/wash with your normal soap after using and rinsing off  the CHG Soap.                9.  Pat yourself dry with a clean towel.            10.  Wear clean pajamas.            11.  Place clean sheets on your bed the night of your first shower and do not  sleep with pets. Day of Surgery : Do not apply any lotions/deodorants the morning of surgery.  Please wear clean clothes to the hospital/surgery center.  FAILURE TO FOLLOW THESE INSTRUCTIONS MAY RESULT IN THE CANCELLATION OF YOUR SURGERY PATIENT SIGNATURE_________________________________  NURSE SIGNATURE__________________________________  ________________________________________________________________________

## 2019-09-03 NOTE — Telephone Encounter (Signed)
Called to discuss recent CT scan, surgery scheduled next week. Patient's number listed is also number for her daughter Ashok Pall. Called twice, left VM requesting callback. Also tried number listed for daughter Mamie Laurel) - this is message at [place]. Cellphone listed is missing a digit.  Jeral Pinch MD Gynecologic Oncology

## 2019-09-03 NOTE — Telephone Encounter (Signed)
Called daughter back (she'd returned my call). We discussed CT findings. Prominent/broderline LNs in the pelvis and chest, concerning for metastatic disease. Given the patient's symptoms (bleeding) and that it is not feasible to biopsy lymph nodes percutaneously given small size, I recommend that we proceed as scheduled with surgery. Will still plan on robotic staging, SLN biopsy and removal of any suspicious/concerning lymph nodes noted intra-op.   Based on final pathology results as well as her comorbidities (CKD, age, etc), we will have to have a discussion about what adjuvant treatment the patient is a candidate for if recommended based on pathology.  All questions answered.  Jeral Pinch MD Gynecologic Oncology

## 2019-09-06 ENCOUNTER — Telehealth: Payer: Self-pay | Admitting: *Deleted

## 2019-09-06 ENCOUNTER — Encounter (HOSPITAL_COMMUNITY): Payer: Self-pay

## 2019-09-06 ENCOUNTER — Other Ambulatory Visit (HOSPITAL_COMMUNITY)
Admission: RE | Admit: 2019-09-06 | Discharge: 2019-09-06 | Disposition: A | Discharge status: Home / Self Care | Payer: Medicare Other | Source: Ambulatory Visit | Attending: Gynecologic Oncology | Admitting: Gynecologic Oncology

## 2019-09-06 ENCOUNTER — Encounter (HOSPITAL_COMMUNITY)
Admission: RE | Admit: 2019-09-06 | Discharge: 2019-09-06 | Disposition: A | Discharge status: Home / Self Care | Payer: Medicare Other | Source: Ambulatory Visit | Attending: Gynecologic Oncology | Admitting: Gynecologic Oncology

## 2019-09-06 ENCOUNTER — Other Ambulatory Visit: Payer: Self-pay

## 2019-09-06 DIAGNOSIS — Z01812 Encounter for preprocedural laboratory examination: Secondary | ICD-10-CM | POA: Insufficient documentation

## 2019-09-06 DIAGNOSIS — Z20822 Contact with and (suspected) exposure to covid-19: Secondary | ICD-10-CM | POA: Diagnosis not present

## 2019-09-06 LAB — URINALYSIS, ROUTINE W REFLEX MICROSCOPIC
Bacteria, UA: NONE SEEN
Bilirubin Urine: NEGATIVE
Glucose, UA: NEGATIVE mg/dL
Hgb urine dipstick: NEGATIVE
Ketones, ur: NEGATIVE mg/dL
Nitrite: NEGATIVE
Protein, ur: NEGATIVE mg/dL
Specific Gravity, Urine: 1.016 (ref 1.005–1.030)
pH: 5 (ref 5.0–8.0)

## 2019-09-06 LAB — COMPREHENSIVE METABOLIC PANEL
ALT: 15 U/L (ref 0–44)
AST: 21 U/L (ref 15–41)
Albumin: 3.5 g/dL (ref 3.5–5.0)
Alkaline Phosphatase: 39 U/L (ref 38–126)
Anion gap: 10 (ref 5–15)
BUN: 25 mg/dL — ABNORMAL HIGH (ref 8–23)
CO2: 22 mmol/L (ref 22–32)
Calcium: 9.1 mg/dL (ref 8.9–10.3)
Chloride: 108 mmol/L (ref 98–111)
Creatinine, Ser: 2.05 mg/dL — ABNORMAL HIGH (ref 0.44–1.00)
GFR calc Af Amer: 25 mL/min — ABNORMAL LOW (ref >60)
GFR calc non Af Amer: 22 mL/min — ABNORMAL LOW (ref >60)
Glucose, Bld: 95 mg/dL (ref 70–99)
Potassium: 4.6 mmol/L (ref 3.5–5.1)
Sodium: 140 mmol/L (ref 135–145)
Total Bilirubin: 0.4 mg/dL (ref 0.3–1.2)
Total Protein: 7.5 g/dL (ref 6.5–8.1)

## 2019-09-06 LAB — CBC
HCT: 29.3 % — ABNORMAL LOW (ref 36.0–46.0)
Hemoglobin: 9.4 g/dL — ABNORMAL LOW (ref 12.0–15.0)
MCH: 29.8 pg (ref 26.0–34.0)
MCHC: 32.1 g/dL (ref 30.0–36.0)
MCV: 93 fL (ref 80.0–100.0)
Platelets: 299 10*3/uL (ref 150–400)
RBC: 3.15 MIL/uL — ABNORMAL LOW (ref 3.87–5.11)
RDW: 15 % (ref 11.5–15.5)
WBC: 4.9 10*3/uL (ref 4.0–10.5)
nRBC: 0 % (ref 0.0–0.2)

## 2019-09-06 LAB — SARS CORONAVIRUS 2 (TAT 6-24 HRS): SARS Coronavirus 2: NEGATIVE

## 2019-09-06 NOTE — Progress Notes (Addendum)
Anesthesia Review:  PCP: Sherrie Mustache, NP  Saw on 08/31/19- [place]- epic for clearance LOV  Cardiologist :  On 09/07/19 to see Havery Moros for cardiac clearance at [facility]- Dr Deterd Care 09/03/19- Clearance- Nephrology visit   Chest x-ray : CT chest- 09/01/19  EKG :08/31/19  Echo : Stress test: Cardiac Cath :  Activity level: Can do a flight of stiars without difficulty Sleep Study/ CPAP :Does not use CPAP per pt  Fasting Blood Sugar :      / Checks Blood Sugar -- times a day:   Blood Thinner/ Instructions /Last Dose: ASA / Instructions/ Last Dose :  81 mg Aspirin  HGB on 08/31/19 was 9.7 in epic  HGB on 09/06/19- 9.8-faxed to DR Berline Lopes and Zoila Shutter along with CMP results.  And U/A results

## 2019-09-06 NOTE — Telephone Encounter (Signed)
Form has been placed in Regina Porter's box for completion since she saw the patient. Regina Porter let me know if you have any questions on this. I appreciate it.

## 2019-09-06 NOTE — Telephone Encounter (Signed)
Regina Porter with [facility] called and stated that they faxed a surgery clearance form last week and still have not received it back. Patient's surgery is scheduled for this Thursday and they need the clearance back.   Please Advise.    Patient saw Dinah on 08/31/2019: 8. Pre-op examination scheduled for Hysterectomy robotic assisted hysterectomy with Dr.Kathrine Berline Lopes 09/09/2019.Awaiting pre-op medical clearance form to be sent from Dr.Tucker's office then will signed and fax back. - EKG 12-Lead done today indicates Aterial rhythm with first degree AV-block.previous EKG reviewed also showed   first degree AV-block.Has appointment with Cardiologist for pre-op clearance. - Not on anticoagulant but on Asprin 81 EC tablet will need to hold before surgery.

## 2019-09-06 NOTE — Progress Notes (Addendum)
CBC and CMP done 09/06/19 faxed via epic to Dr Berline Lopes and Zoila Shutter U/A done 09/06/19 faxed via epic to DR Berline Lopes and Zoila Shutter.

## 2019-09-06 NOTE — Progress Notes (Signed)
Regina Cross,NP aware of CBC and CMP results done 09/06/19.  Okay per [name],NP to draw CA125 am of surgery.

## 2019-09-07 ENCOUNTER — Ambulatory Visit (INDEPENDENT_AMBULATORY_CARE_PROVIDER_SITE_OTHER): Payer: Medicare Other | Admitting: Physician Assistant

## 2019-09-07 ENCOUNTER — Encounter: Payer: Self-pay | Admitting: Physician Assistant

## 2019-09-07 ENCOUNTER — Encounter: Payer: Self-pay | Admitting: Anesthesiology

## 2019-09-07 VITALS — BP 160/68 | HR 76 | Ht 65.0 in | Wt 182.0 lb

## 2019-09-07 DIAGNOSIS — I5032 Chronic diastolic (congestive) heart failure: Secondary | ICD-10-CM | POA: Diagnosis not present

## 2019-09-07 DIAGNOSIS — I10 Essential (primary) hypertension: Secondary | ICD-10-CM | POA: Diagnosis not present

## 2019-09-07 DIAGNOSIS — Z01818 Encounter for other preprocedural examination: Secondary | ICD-10-CM

## 2019-09-07 DIAGNOSIS — R011 Cardiac murmur, unspecified: Secondary | ICD-10-CM

## 2019-09-07 DIAGNOSIS — R002 Palpitations: Secondary | ICD-10-CM

## 2019-09-07 DIAGNOSIS — R9431 Abnormal electrocardiogram [ECG] [EKG]: Secondary | ICD-10-CM

## 2019-09-07 NOTE — Patient Instructions (Signed)
Medication Instructions:  Your physician recommends that you continue on your current medications as directed. Please refer to the Current Medication list given to you today.  *If you need a refill on your cardiac medications before your next appointment, please call your pharmacy*   Lab Work: None   If you have labs (blood work) drawn today and your tests are completely normal, you will receive your results only by: . MyChart Message (if you have MyChart) OR . A paper copy in the mail If you have any lab test that is abnormal or we need to change your treatment, we will call you to review the results.   Testing/Procedures: Your physician has requested that you have an echocardiogram. Echocardiography is a painless test that uses sound waves to create images of your heart. It provides your doctor with information about the size and shape of your heart and how well your heart's chambers and valves are working. This procedure takes approximately one hour. There are no restrictions for this procedure.  Follow-Up: At [facility], you and your health needs are our priority.  As part of our continuing mission to provide you with exceptional heart care, we have created designated Provider Care Teams.  These Care Teams include your primary Cardiologist (physician) and Advanced Practice Providers (APPs -  Physician Assistants and Nurse Practitioners) who all work together to provide you with the care you need, when you need it.  We recommend signing up for the patient portal called "MyChart".  Sign up information is provided on this After Visit Summary.  MyChart is used to connect with patients for Virtual Visits (Telemedicine).  Patients are able to view lab/test results, encounter notes, upcoming appointments, etc.  Non-urgent messages can be sent to your provider as well.   To learn more about what you can do with MyChart, go to [email or web address].com.    Your next appointment:   12  month(s)  The format for your next appointment:   In Person  Provider:   You may see Jayadeep Varanasi, MD or one of the following Advanced Practice Providers on your designated Care Team:    Dayna Dunn, PA-C  Michele Lenze, PA-C  Other Instructions None  

## 2019-09-07 NOTE — Telephone Encounter (Signed)
Form signed to be faxed to Gyn.

## 2019-09-08 ENCOUNTER — Telehealth: Payer: Self-pay

## 2019-09-08 ENCOUNTER — Encounter (HOSPITAL_COMMUNITY): Payer: Self-pay | Admitting: Gynecologic Oncology

## 2019-09-08 NOTE — Telephone Encounter (Signed)
LM for daughter to call the office to review her mother's pre op instructions for tomorrow.

## 2019-09-08 NOTE — Telephone Encounter (Signed)
LM stating that this call was to see if she had any questions regarding her mother's written pre-op instructions for surgery tomorrow.

## 2019-09-08 NOTE — Anesthesia Preprocedure Evaluation (Addendum)
Anesthesia Evaluation  Patient identified by MRN, date of birth, ID band Patient awake    Reviewed: Allergy & Precautions, Patient's Chart, lab work & pertinent test results  Airway Mallampati: I  TM Distance: >3 FB Neck ROM: Full    Dental  (+) Edentulous Upper, Edentulous Lower   Pulmonary sleep apnea , former smoker,    breath sounds clear to auscultation       Cardiovascular hypertension,  Rhythm:Regular Rate:Normal  1st degree AV block   Neuro/Psych  Headaches, PSYCHIATRIC DISORDERS Anxiety Depression Dementia    GI/Hepatic Neg liver ROS, GERD  ,  Endo/Other  Hypothyroidism   Renal/GU CRF and Renal InsufficiencyRenal disease     Musculoskeletal  (+) Arthritis ,   Abdominal Normal abdominal exam  (+)   Peds  Hematology  (+) Blood dyscrasia, anemia ,   Anesthesia Other Findings   Reproductive/Obstetrics                           Anesthesia Physical  Anesthesia Plan  ASA: III  Anesthesia Plan: General   Post-op Pain Management:    Induction: Intravenous  PONV Risk Score and Plan: 4 or greater and Ondansetron, Dexamethasone and Treatment may vary due to age or medical condition  Airway Management Planned: Oral ETT  Additional Equipment: None  Intra-op Plan:   Post-operative Plan: Extubation in OR  Informed Consent: I have reviewed the patients History and Physical, chart, labs and discussed the procedure including the risks, benefits and alternatives for the proposed anesthesia with the patient or authorized representative who has indicated his/her understanding and acceptance.       Plan Discussed with: CRNA and Anesthesiologist  Anesthesia Plan Comments: (Preoperative clearance before undergoing robotic assisted total hysterectomy with bilateral oophorectomy for possible endometrial cancer by Dr. Berline Lopes 09/09/2019.  Patient has history of hypertension, palpitations,  chronic diastolic CHF.  Echo in 2018 with normal LV function diastolic dysfunction, Holter monitor no arrhythmias.  Patient's cardiac risk index is 6.6% because of creatinine of 2.05 and prior diastolic CHF but  METs is over 5.  She has no cardiac complaints.  Can proceed with surgery without any further cardiac work-up.  Schedule echo for follow-up of systolic murmur of MR.  This does not have to be done before surgery.  According to the Revised Cardiac Risk Index (RCRI), her Perioperative Risk of Major Cardiac Event is (%): 6.6  Her Functional Capacity in METs is: 5.07 according to the [employer] Activity Status Index (DASI).)      Anesthesia Quick Evaluation

## 2019-09-08 NOTE — Progress Notes (Signed)
Anesthesia Chart Review   Case: [id number] Date/Time: 09/09/19 0700   Procedures:      XI ROBOTIC ASSISTED TOTAL HYSTERECTOMY WITH BILATERAL SALPINGO OOPHORECTOMY (Bilateral )     SENTINEL NODE BIOPSY AND POSSIBLE LYMPH NODE DISECTION AND POSSIBLE LAPAROTOMY (N/A )   Anesthesia type: General   Pre-op diagnosis: ENDOMETRIAL CANCER   Location: [facility] ROOM 03 / [facility]   Surgeons: Lafonda Mosses, MD      DISCUSSION:85 y.o. former smoker (2 pack years, quit 01/14/06) with h/o dementia, HTN, hypothyroidism, CKD Stage IV, chronic diastolic CHF, sleep apnea, endometrial cancer scheduled for above procedure 09/09/2019 with Dr. Jeral Pinch.   Pt seen by cardiology 09/07/2019.  Per OV note, "Preoperative clearance before undergoing robotic assisted total hysterectomy with bilateral oophorectomy for possible endometrial cancer by Dr. Berline Lopes 09/09/2019.  Patient has history of hypertension, palpitations, chronic diastolic CHF.  Echo in 2018 with normal LV function diastolic dysfunction, Holter monitor no arrhythmias.  Patient's cardiac risk index is 6.6% because of creatinine of 2.05 and prior diastolic CHF but  METs is over 5.  She has no cardiac complaints.  Can proceed with surgery without any further cardiac work-up.  Schedule echo for follow-up of systolic murmur of MR.  This does not have to be done before surgery. According to the Revised Cardiac Risk Index (RCRI), her Perioperative Risk of Major Cardiac Event is (%): 6.6 Her Functional Capacity in METs is: 5.07 according to the [employer] Activity Status Index (DASI)."  Anticipate pt can proceed with planned procedure barring acute status change.   VS: There were no vitals taken for this visit.  PROVIDERS: [name], NP is PCP  Larae Grooms, MD is Cardiologist  LABS: Labs reviewed: Acceptable for surgery. (all labs ordered are listed, but only abnormal results are displayed)  Labs Reviewed - No data to  display   IMAGES:   EKG: 08/31/2019 Rate 76 bpm  1st degree AV block   CV: Echo 05/07/2016 Study Conclusions   - Left ventricle: The cavity size was normal. Wall thickness was  increased in a pattern of mild LVH. Systolic function was  vigorous. The estimated ejection fraction was in the range of 65%  to 70%. Wall motion was normal; there were no regional wall  motion abnormalities. Doppler parameters are consistent with  abnormal left ventricular relaxation (grade 1 diastolic  dysfunction).  - Aortic valve: Mildly to moderately calcified annulus.  - Mitral valve: There was mild regurgitation.  Past Medical History:  Diagnosis Date  . Allergy    mild   . Anemia   . Arthritis    pt states no pain   . Breast cancer ([place])    left   . Chronic kidney disease    ckd stage 4 per lov dr deterding 08-03-2018 on chart  . Dementia ([place])   . Depression   . Gastroesophageal reflux disease   . Heart murmur   . Heartburn   . Hypertension   . Hypothyroidism   . Leg cramps   . Post-menopause bleeding   . Restless leg syndrome 10/07/2014  . Sleep apnea    cpap not in use     Past Surgical History:  Procedure Laterality Date  . back sugery   2019   lower back   . BREAST LUMPECTOMY    . BREAST SURGERY Left 2013   Lumpectomy  radiation done no chemo  . CATARACT EXTRACTION, BILATERAL    . DILATATION & CURETTAGE/HYSTEROSCOPY WITH MYOSURE N/A  08/23/2019   Procedure: DILATATION & CURETTAGE/HYSTEROSCOPY WITH MYOSURE;  Surgeon: [name], MD;  Location: [facility];  Service: Gynecology;  Laterality: N/A;  request to follow 2nd case in [place] block requests one hour  . hysteroscopy biopsy      MEDICATIONS: No current facility-administered medications for this encounter.   Marland Kitchen acetaminophen (TYLENOL) 500 MG tablet  . amLODipine (NORVASC) 10 MG tablet  . aspirin 81 MG EC tablet  . bumetanide (BUMEX) 2 MG tablet  . busPIRone (BUSPAR) 10 MG  tablet  . Carboxymethylcellulose Sodium (EYE DROPS OP)  . Cod Liver Oil 1000 MG CAPS  . donepezil (ARICEPT) 5 MG tablet  . hydrALAZINE (APRESOLINE) 100 MG tablet  . levothyroxine (SYNTHROID) 75 MCG tablet  . losartan (COZAAR) 100 MG tablet  . megestrol (MEGACE) 40 MG tablet  . traMADol (ULTRAM) 50 MG tablet  . traZODone (DESYREL) 50 MG tablet  . metoprolol succinate (TOPROL-XL) 100 MG 24 hr tablet  . Multiple Vitamin (MULTIVITAMIN ADULT PO)  . senna-docusate (SENOKOT-S) 8.6-50 MG tablet     Konrad Felix, PA-C [facility] [phone]

## 2019-09-09 ENCOUNTER — Ambulatory Visit (HOSPITAL_COMMUNITY): Payer: Medicare Other | Admitting: Physician Assistant

## 2019-09-09 ENCOUNTER — Encounter (HOSPITAL_COMMUNITY)
Admission: RE | Disposition: A | Discharge status: Home / Self Care | Payer: Self-pay | Source: Ambulatory Visit | Attending: Gynecologic Oncology

## 2019-09-09 ENCOUNTER — Ambulatory Visit (HOSPITAL_COMMUNITY)
Admission: RE | Admit: 2019-09-09 | Discharge: 2019-09-09 | Disposition: A | Discharge status: Home / Self Care | Payer: Medicare Other | Source: Ambulatory Visit | Attending: Gynecologic Oncology | Admitting: Gynecologic Oncology

## 2019-09-09 ENCOUNTER — Other Ambulatory Visit: Payer: Self-pay

## 2019-09-09 ENCOUNTER — Encounter (HOSPITAL_COMMUNITY): Payer: Self-pay | Admitting: Gynecologic Oncology

## 2019-09-09 DIAGNOSIS — F329 Major depressive disorder, single episode, unspecified: Secondary | ICD-10-CM | POA: Insufficient documentation

## 2019-09-09 DIAGNOSIS — G4730 Sleep apnea, unspecified: Secondary | ICD-10-CM | POA: Insufficient documentation

## 2019-09-09 DIAGNOSIS — C775 Secondary and unspecified malignant neoplasm of intrapelvic lymph nodes: Secondary | ICD-10-CM | POA: Diagnosis not present

## 2019-09-09 DIAGNOSIS — E039 Hypothyroidism, unspecified: Secondary | ICD-10-CM | POA: Insufficient documentation

## 2019-09-09 DIAGNOSIS — I129 Hypertensive chronic kidney disease with stage 1 through stage 4 chronic kidney disease, or unspecified chronic kidney disease: Secondary | ICD-10-CM | POA: Insufficient documentation

## 2019-09-09 DIAGNOSIS — F0390 Unspecified dementia without behavioral disturbance: Secondary | ICD-10-CM | POA: Diagnosis not present

## 2019-09-09 DIAGNOSIS — N184 Chronic kidney disease, stage 4 (severe): Secondary | ICD-10-CM | POA: Diagnosis not present

## 2019-09-09 DIAGNOSIS — Z79890 Hormone replacement therapy (postmenopausal): Secondary | ICD-10-CM | POA: Insufficient documentation

## 2019-09-09 DIAGNOSIS — Z87891 Personal history of nicotine dependence: Secondary | ICD-10-CM | POA: Diagnosis not present

## 2019-09-09 DIAGNOSIS — Z853 Personal history of malignant neoplasm of breast: Secondary | ICD-10-CM | POA: Insufficient documentation

## 2019-09-09 DIAGNOSIS — C772 Secondary and unspecified malignant neoplasm of intra-abdominal lymph nodes: Secondary | ICD-10-CM | POA: Insufficient documentation

## 2019-09-09 DIAGNOSIS — Z7982 Long term (current) use of aspirin: Secondary | ICD-10-CM | POA: Diagnosis not present

## 2019-09-09 DIAGNOSIS — Z79899 Other long term (current) drug therapy: Secondary | ICD-10-CM | POA: Insufficient documentation

## 2019-09-09 DIAGNOSIS — F419 Anxiety disorder, unspecified: Secondary | ICD-10-CM | POA: Insufficient documentation

## 2019-09-09 DIAGNOSIS — C541 Malignant neoplasm of endometrium: Secondary | ICD-10-CM | POA: Insufficient documentation

## 2019-09-09 HISTORY — PX: ROBOTIC ASSISTED TOTAL HYSTERECTOMY WITH BILATERAL SALPINGO OOPHERECTOMY: SHX6086

## 2019-09-09 HISTORY — PX: SENTINEL NODE BIOPSY: SHX6608

## 2019-09-09 LAB — TYPE AND SCREEN
ABO/RH(D): O POS
Antibody Screen: NEGATIVE

## 2019-09-09 SURGERY — HYSTERECTOMY, TOTAL, ROBOT-ASSISTED, LAPAROSCOPIC, WITH BILATERAL SALPINGO-OOPHORECTOMY
Anesthesia: General

## 2019-09-09 MED ORDER — DEXAMETHASONE SODIUM PHOSPHATE 10 MG/ML IJ SOLN
INTRAMUSCULAR | Status: AC
  Filled 2019-09-09: qty 1

## 2019-09-09 MED ORDER — HYDROMORPHONE HCL 1 MG/ML IJ SOLN: 0.2500 mg | INTRAMUSCULAR | Status: DC | PRN

## 2019-09-09 MED ORDER — CHLORHEXIDINE GLUCONATE 0.12 % MT SOLN
15.0000 mL | Freq: Once | OROMUCOSAL | Status: AC
  Administered 2019-09-09: 15 mL via OROMUCOSAL

## 2019-09-09 MED ORDER — HYDRALAZINE HCL 20 MG/ML IJ SOLN
INTRAMUSCULAR | Status: AC
  Administered 2019-09-09: 10 mg via INTRAVENOUS
  Filled 2019-09-09: qty 1

## 2019-09-09 MED ORDER — ACETAMINOPHEN 650 MG RE SUPP
650.0000 mg | RECTAL | Status: DC | PRN
  Filled 2019-09-09: qty 1

## 2019-09-09 MED ORDER — FENTANYL CITRATE (PF) 100 MCG/2ML IJ SOLN
INTRAMUSCULAR | Status: AC
  Filled 2019-09-09: qty 2

## 2019-09-09 MED ORDER — ROCURONIUM BROMIDE 10 MG/ML (PF) SYRINGE
PREFILLED_SYRINGE | INTRAVENOUS | Status: AC
  Filled 2019-09-09: qty 10

## 2019-09-09 MED ORDER — OXYCODONE HCL 5 MG PO TABS: 5.0000 mg | ORAL_TABLET | ORAL | Status: DC | PRN

## 2019-09-09 MED ORDER — FENTANYL CITRATE (PF) 250 MCG/5ML IJ SOLN
INTRAMUSCULAR | Status: DC | PRN
  Administered 2019-09-09 (×2): 25 ug via INTRAVENOUS
  Administered 2019-09-09: 50 ug via INTRAVENOUS
  Administered 2019-09-09: 25 ug via INTRAVENOUS
  Administered 2019-09-09 (×2): 50 ug via INTRAVENOUS

## 2019-09-09 MED ORDER — ROCURONIUM BROMIDE 10 MG/ML (PF) SYRINGE
PREFILLED_SYRINGE | INTRAVENOUS | Status: DC | PRN
  Administered 2019-09-09: 20 mg via INTRAVENOUS
  Administered 2019-09-09: 10 mg via INTRAVENOUS
  Administered 2019-09-09: 80 mg via INTRAVENOUS

## 2019-09-09 MED ORDER — SUGAMMADEX SODIUM 200 MG/2ML IV SOLN
INTRAVENOUS | Status: DC | PRN
  Administered 2019-09-09: 200 mg via INTRAVENOUS

## 2019-09-09 MED ORDER — CEFAZOLIN SODIUM-DEXTROSE 2-4 GM/100ML-% IV SOLN
2.0000 g | INTRAVENOUS | Status: AC
  Administered 2019-09-09: 2 g via INTRAVENOUS
  Filled 2019-09-09: qty 100

## 2019-09-09 MED ORDER — SODIUM CHLORIDE 0.9% FLUSH: 3.0000 mL | Freq: Two times a day (BID) | INTRAVENOUS | Status: DC

## 2019-09-09 MED ORDER — ONDANSETRON HCL 4 MG/2ML IJ SOLN
INTRAMUSCULAR | Status: DC | PRN
  Administered 2019-09-09: 4 mg via INTRAVENOUS

## 2019-09-09 MED ORDER — HYDROMORPHONE HCL 1 MG/ML IJ SOLN
INTRAMUSCULAR | Status: AC
  Administered 2019-09-09: 0.25 mg via INTRAVENOUS
  Filled 2019-09-09: qty 1

## 2019-09-09 MED ORDER — BUPIVACAINE HCL 0.25 % IJ SOLN
INTRAMUSCULAR | Status: DC | PRN
  Administered 2019-09-09: 50 mL

## 2019-09-09 MED ORDER — DEXAMETHASONE SODIUM PHOSPHATE 4 MG/ML IJ SOLN: 4.0000 mg | INTRAMUSCULAR | Status: DC

## 2019-09-09 MED ORDER — OXYCODONE HCL 5 MG PO TABS
ORAL_TABLET | ORAL | Status: AC
  Administered 2019-09-09: 5 mg via ORAL
  Filled 2019-09-09: qty 1

## 2019-09-09 MED ORDER — BUPIVACAINE HCL 0.25 % IJ SOLN
INTRAMUSCULAR | Status: AC
  Filled 2019-09-09: qty 1

## 2019-09-09 MED ORDER — PROMETHAZINE HCL 25 MG/ML IJ SOLN: 6.2500 mg | INTRAMUSCULAR | Status: DC | PRN

## 2019-09-09 MED ORDER — SODIUM CHLORIDE 0.9 % IV SOLN: 250.0000 mL | INTRAVENOUS | Status: DC | PRN

## 2019-09-09 MED ORDER — HEPARIN SODIUM (PORCINE) 5000 UNIT/ML IJ SOLN
5000.0000 [IU] | INTRAMUSCULAR | Status: AC
  Administered 2019-09-09: 5000 [IU] via SUBCUTANEOUS
  Filled 2019-09-09: qty 1

## 2019-09-09 MED ORDER — SODIUM CHLORIDE 0.9% FLUSH: 3.0000 mL | INTRAVENOUS | Status: DC | PRN

## 2019-09-09 MED ORDER — ENOXAPARIN SODIUM 30 MG/0.3ML ~~LOC~~ SOLN: SUBCUTANEOUS | 0 refills | Status: DC

## 2019-09-09 MED ORDER — STERILE WATER FOR IRRIGATION IR SOLN
Status: DC | PRN
  Administered 2019-09-09: 1000 mL

## 2019-09-09 MED ORDER — ROCURONIUM BROMIDE 10 MG/ML (PF) SYRINGE
PREFILLED_SYRINGE | INTRAVENOUS | Status: AC
  Filled 2019-09-09: qty 20

## 2019-09-09 MED ORDER — MIDAZOLAM HCL 2 MG/2ML IJ SOLN
INTRAMUSCULAR | Status: AC
  Filled 2019-09-09: qty 2

## 2019-09-09 MED ORDER — NEOSTIGMINE METHYLSULFATE 3 MG/3ML IV SOSY
PREFILLED_SYRINGE | INTRAVENOUS | Status: AC
  Filled 2019-09-09: qty 3

## 2019-09-09 MED ORDER — DEXAMETHASONE SODIUM PHOSPHATE 10 MG/ML IJ SOLN
INTRAMUSCULAR | Status: DC | PRN
  Administered 2019-09-09: 4 mg via INTRAVENOUS

## 2019-09-09 MED ORDER — MORPHINE SULFATE (PF) 4 MG/ML IV SOLN: 2.0000 mg | INTRAVENOUS | Status: DC | PRN

## 2019-09-09 MED ORDER — STERILE WATER FOR INJECTION IJ SOLN
INTRAMUSCULAR | Status: DC | PRN
  Administered 2019-09-09: 20 mL

## 2019-09-09 MED ORDER — LACTATED RINGERS IV SOLN: INTRAVENOUS | Status: DC

## 2019-09-09 MED ORDER — LIDOCAINE 2% (20 MG/ML) 5 ML SYRINGE
INTRAMUSCULAR | Status: AC
  Filled 2019-09-09: qty 5

## 2019-09-09 MED ORDER — STERILE WATER FOR INJECTION IJ SOLN: INTRAMUSCULAR | Status: DC | PRN

## 2019-09-09 MED ORDER — PROPOFOL 10 MG/ML IV BOLUS
INTRAVENOUS | Status: DC | PRN
  Administered 2019-09-09 (×2): 20 mg via INTRAVENOUS
  Administered 2019-09-09: 130 mg via INTRAVENOUS

## 2019-09-09 MED ORDER — STERILE WATER FOR INJECTION IJ SOLN
INTRAMUSCULAR | Status: AC
  Filled 2019-09-09: qty 10

## 2019-09-09 MED ORDER — LACTATED RINGERS IR SOLN
Status: DC | PRN
  Administered 2019-09-09: 1000 mL

## 2019-09-09 MED ORDER — PROPOFOL 10 MG/ML IV BOLUS
INTRAVENOUS | Status: AC
  Filled 2019-09-09: qty 40

## 2019-09-09 MED ORDER — HYDRALAZINE HCL 20 MG/ML IJ SOLN: 10.0000 mg | INTRAMUSCULAR | Status: DC | PRN

## 2019-09-09 MED ORDER — ENOXAPARIN (LOVENOX) PATIENT EDUCATION KIT
PACK | Freq: Once | Status: DC
  Filled 2019-09-09: qty 1

## 2019-09-09 MED ORDER — ACETAMINOPHEN 500 MG PO TABS
1000.0000 mg | ORAL_TABLET | ORAL | Status: AC
  Administered 2019-09-09: 1000 mg via ORAL
  Filled 2019-09-09: qty 2

## 2019-09-09 MED ORDER — ACETAMINOPHEN 325 MG PO TABS: 650.0000 mg | ORAL_TABLET | ORAL | Status: DC | PRN

## 2019-09-09 MED ORDER — LIDOCAINE 20MG/ML (2%) 15 ML SYRINGE OPTIME
INTRAMUSCULAR | Status: DC | PRN
  Administered 2019-09-09: 1.5 mg/kg/h via INTRAVENOUS

## 2019-09-09 MED ORDER — LIDOCAINE 2% (20 MG/ML) 5 ML SYRINGE
INTRAMUSCULAR | Status: DC | PRN
  Administered 2019-09-09: 40 mg via INTRAVENOUS

## 2019-09-09 MED ORDER — ONDANSETRON HCL 4 MG/2ML IJ SOLN
INTRAMUSCULAR | Status: AC
  Filled 2019-09-09: qty 2

## 2019-09-09 MED FILL — ENOXAPARIN 30 MG/0.3 ML SYR: 30 | 14 days supply | Qty: 4 | Fill #0 | Status: TO

## 2019-09-09 MED FILL — ENOXAPARIN 30 MG/0.3 ML SYR: 30 | 14 days supply | Qty: 4 | Fill #0

## 2019-09-09 SURGICAL SUPPLY — 69 items
APPLICATOR ARISTA FLEXITIP XL (MISCELLANEOUS) ×3 IMPLANT
APPLICATOR SURGIFLO ENDO (HEMOSTASIS) ×3 IMPLANT
BACTOSHIELD CHG 4% 4OZ (MISCELLANEOUS) ×1
BAG LAPAROSCOPIC 12 15 PORT 16 (BASKET) ×2 IMPLANT
BAG RETRIEVAL 12/15 (BASKET) ×3
BLADE SURG SZ10 CARB STEEL (BLADE) ×3 IMPLANT
COVER BACK TABLE 60X90IN (DRAPES) ×3 IMPLANT
COVER TIP SHEARS 8 DVNC (MISCELLANEOUS) ×2 IMPLANT
COVER TIP SHEARS 8MM DA VINCI (MISCELLANEOUS) ×1
COVER WAND RF STERILE (DRAPES) IMPLANT
DECANTER SPIKE VIAL GLASS SM (MISCELLANEOUS) IMPLANT
DERMABOND ADVANCED (GAUZE/BANDAGES/DRESSINGS) ×1
DERMABOND ADVANCED .7 DNX12 (GAUZE/BANDAGES/DRESSINGS) ×2 IMPLANT
DRAPE ARM DVNC X/XI (DISPOSABLE) ×8 IMPLANT
DRAPE COLUMN DVNC XI (DISPOSABLE) ×2 IMPLANT
DRAPE DA VINCI XI ARM (DISPOSABLE) ×4
DRAPE DA VINCI XI COLUMN (DISPOSABLE) ×1
DRAPE SHEET LG 3/4 BI-LAMINATE (DRAPES) ×3 IMPLANT
DRAPE SURG IRRIG POUCH 19X23 (DRAPES) ×3 IMPLANT
DRSG OPSITE POSTOP 4X6 (GAUZE/BANDAGES/DRESSINGS) ×3 IMPLANT
DRSG OPSITE POSTOP 4X8 (GAUZE/BANDAGES/DRESSINGS) IMPLANT
DRSG TEGADERM 8X12 (GAUZE/BANDAGES/DRESSINGS) ×3 IMPLANT
ELECT REM PT RETURN 15FT ADLT (MISCELLANEOUS) ×3 IMPLANT
GAUZE 4X4 16PLY RFD (DISPOSABLE) ×3 IMPLANT
GLOVE BIO SURGEON STRL SZ 6 (GLOVE) ×12 IMPLANT
GLOVE BIO SURGEON STRL SZ 6.5 (GLOVE) ×6 IMPLANT
GOWN STRL REUS W/ TWL LRG LVL3 (GOWN DISPOSABLE) ×8 IMPLANT
GOWN STRL REUS W/TWL LRG LVL3 (GOWN DISPOSABLE) ×4
HEMOSTAT ARISTA ABSORB 3G PWDR (HEMOSTASIS) ×3 IMPLANT
HOLDER FOLEY CATH W/STRAP (MISCELLANEOUS) ×3 IMPLANT
IRRIG SUCT STRYKERFLOW 2 WTIP (MISCELLANEOUS) ×3
IRRIGATION SUCT STRKRFLW 2 WTP (MISCELLANEOUS) ×2 IMPLANT
KIT PROCEDURE DA VINCI SI (MISCELLANEOUS)
KIT PROCEDURE DVNC SI (MISCELLANEOUS) IMPLANT
KIT TURNOVER KIT A (KITS) IMPLANT
MANIPULATOR UTERINE 4.5 ZUMI (MISCELLANEOUS) ×3 IMPLANT
NEEDLE HYPO 21X1.5 SAFETY (NEEDLE) ×3 IMPLANT
NEEDLE SPNL 18GX3.5 QUINCKE PK (NEEDLE) IMPLANT
OBTURATOR OPTICAL STANDARD 8MM (TROCAR) ×1
OBTURATOR OPTICAL STND 8 DVNC (TROCAR) ×2
OBTURATOR OPTICALSTD 8 DVNC (TROCAR) ×2 IMPLANT
PACK ROBOT GYN CUSTOM WL (TRAY / TRAY PROCEDURE) ×3 IMPLANT
PAD POSITIONING PINK XL (MISCELLANEOUS) ×3 IMPLANT
PENCIL SMOKE EVACUATOR (MISCELLANEOUS) IMPLANT
PORT ACCESS TROCAR AIRSEAL 12 (TROCAR) ×2 IMPLANT
PORT ACCESS TROCAR AIRSEAL 5M (TROCAR) ×1
POUCH SPECIMEN RETRIEVAL 10MM (ENDOMECHANICALS) ×6 IMPLANT
SCRUB CHG 4% DYNA-HEX 4OZ (MISCELLANEOUS) ×2 IMPLANT
SEAL CANN UNIV 5-8 DVNC XI (MISCELLANEOUS) ×8 IMPLANT
SEAL XI 5MM-8MM UNIVERSAL (MISCELLANEOUS) ×4
SET TRI-LUMEN FLTR TB AIRSEAL (TUBING) ×3 IMPLANT
SPONGE LAP 18X18 RF (DISPOSABLE) ×3 IMPLANT
SURGIFLO W/THROMBIN 8M KIT (HEMOSTASIS) ×3 IMPLANT
SUT MNCRL AB 4-0 PS2 18 (SUTURE) IMPLANT
SUT PDS AB 1 TP1 96 (SUTURE) ×6 IMPLANT
SUT PROLENE 5 0 CC 1 (SUTURE) ×3 IMPLANT
SUT VIC AB 0 CT1 27 (SUTURE)
SUT VIC AB 0 CT1 27XBRD ANTBC (SUTURE) IMPLANT
SUT VIC AB 2-0 CT1 27 (SUTURE) ×1
SUT VIC AB 2-0 CT1 27XBRD (SUTURE) ×2 IMPLANT
SUT VIC AB 2-0 CT1 TAPERPNT 27 (SUTURE) IMPLANT
SUT VICRYL 4-0 PS2 18IN ABS (SUTURE) ×6 IMPLANT
SYR 10ML LL (SYRINGE) IMPLANT
TOWEL OR NON WOVEN STRL DISP B (DISPOSABLE) ×3 IMPLANT
TRAP SPECIMEN MUCUS 40CC (MISCELLANEOUS) IMPLANT
TRAY FOLEY MTR SLVR 16FR STAT (SET/KITS/TRAYS/PACK) ×3 IMPLANT
TROCAR XCEL NON-BLD 5MMX100MML (ENDOMECHANICALS) IMPLANT
UNDERPAD 30X36 HEAVY ABSORB (UNDERPADS AND DIAPERS) ×3 IMPLANT
WATER STERILE IRR 1000ML POUR (IV SOLUTION) ×3 IMPLANT

## 2019-09-09 NOTE — Transfer of Care (Signed)
Immediate Anesthesia Transfer of Care Note  Patient: Regina Porter  Procedure(s) Performed: XI ROBOTIC ASSISTED TOTAL HYSTERECTOMY WITH BILATERAL SALPINGO OOPHORECTOMY (Bilateral ) SENTINEL NODE BIOPSY AND POSSIBLE LYMPH NODE DISECTION AND POSSIBLE LAPAROTOMY (N/A )  Patient Location: PACU  Anesthesia Type:General  Level of Consciousness: awake, drowsy and patient cooperative  Airway & Oxygen Therapy: Patient Spontanous Breathing and Patient connected to face mask oxygen  Post-op Assessment: Report given to RN and Post -op Vital signs reviewed and stable  Post vital signs: Reviewed and stable  Last Vitals:  Vitals Value Taken Time  BP 153/132 09/09/19 1112  Temp    Pulse 72 09/09/19 1114  Resp    SpO2 100 % 09/09/19 1114  Vitals shown include unvalidated device data.  Last Pain:  Vitals:   09/09/19 0626  TempSrc: Oral  PainSc:          Complications: No complications documented.

## 2019-09-09 NOTE — Interval H&P Note (Signed)
History and Physical Interval Note:  09/09/2019 7:06 AM  Regina Porter  has presented today for surgery, with the diagnosis of ENDOMETRIAL CANCER.  The various methods of treatment have been discussed with the patient and family. After consideration of risks, benefits and other options for treatment, the patient has consented to  Procedure(s): XI ROBOTIC ASSISTED TOTAL HYSTERECTOMY WITH BILATERAL SALPINGO OOPHORECTOMY (Bilateral) SENTINEL NODE BIOPSY AND POSSIBLE LYMPH NODE DISECTION AND POSSIBLE LAPAROTOMY (N/A) as a surgical intervention.  The patient's history has been reviewed, patient examined, no change in status, stable for surgery.  I have reviewed the patient's chart and labs.  Questions were answered to the patient's satisfaction.     Lafonda Mosses

## 2019-09-09 NOTE — Op Note (Signed)
OPERATIVE NOTE  Pre-operative Diagnosis: endometrial cancer, high grade, imaging concerning for metastatic disease  Post-operative Diagnosis: same, obviously involved pelvic and low para-aortic lymph nodes  Operation: Robotic-assisted laparoscopic total hysterectomy with bilateral salpingoophorectomy, SLN biopsy, right pelvic and para-aortic lymphadenectomy  Surgeon: Jeral Pinch MD  Assistant Surgeon: Lahoma Crocker MD (an MD assistant was necessary for tissue manipulation, management of robotic instrumentation, retraction and positioning due to the complexity of the case and hospital policies).   Anesthesia: GET  Urine Output: 500cc clear yellow urine  Operative Findings: On EUA, bulky enlarged uterus, mobile. Normal upper abdominal survey although prominent gallbladder and some filmy adhesions between the liver and anterior abdominal wall. Normal appearing omentum, small and large bowel. Uterus quite bulky and 10-12cm. Normal appearing adnexa. Mapping successful to right obturator and left external iliac SLNs. Multiple obturator lymph nodes on the right, an external iliac lymph node on the right, and lymph nodes along the common iliac and aorta on the right all enlarged, adherent to surround vessels, and filled with tumor. All grossly involved lymph nodes removed.  Estimated Blood Loss:  150 cc      Total IV Fluids: see I&O flowsheet         Specimens: uterus, cervix, bilateral tubes and ovaries, bilateral SLNs (right deep obturator, left external iliac), bilateral enlarged lymph nodes (right obturator, right external iliac, right common and para-aortic lymph nodes left obturator)         Complications:  None apparent; patient tolerated the procedure well.         Disposition: PACU - hemodynamically stable.  Procedure Details  The patient was seen in the Holding Room. The risks, benefits, complications, treatment options, and expected outcomes were discussed with the patient.   The patient concurred with the proposed plan, giving informed consent.  The site of surgery properly noted/marked. The patient was identified as Regina Porter and the procedure verified as a Robotic-assisted hysterectomy with bilateral salpingo oophorectomy with SLN biopsy.   After induction of anesthesia, the patient was draped and prepped in the usual sterile manner. Patient was placed in supine position after anesthesia and draped and prepped in the usual sterile manner as follows: Her arms were tucked to her side with all appropriate precautions.  The shoulders were stabilized with padded shoulder blocks applied to the acromium processes.  The patient was placed in the semi-lithotomy position in [place].  The perineum and vagina were prepped with CholoraPrep. The patient was draped after the CholoraPrep had been allowed to dry for 3 minutes.  A Time Out was held and the above information confirmed.  The urethra was prepped with Betadine. Foley catheter was placed.  A sterile speculum was placed in the vagina.  The cervix was grasped with a single-tooth tenaculum. 2mg  total of ICG was injected into the cervical stroma at 2 and 9 o'clock with 1cc injected at a 1cm and 64mm depth (concentration 0.5mg /ml) in all locations. The cervix was dilated with Kennon Rounds dilators.  The ZUMI uterine manipulator with a medium colpotomizer ring was placed without difficulty.  A pneum occluder balloon was placed over the manipulator.  OG tube placement was confirmed and to suction.   Next, a 10 mm skin incision was made 1 cm below the subcostal margin in the midclavicular line.  The 5 mm Optiview port and scope was used for direct entry.  Opening pressure was under 10 mm CO2.  The abdomen was insufflated and the findings were noted as above.  At this point and all points during the procedure, the patient's intra-abdominal pressure did not exceed 15 mmHg. Next, an 8 mm skin incision was made superior to the umbilicus and a  right and left port were placed about 8 cm lateral to the robot port on the right and left side.  A fourth arm was placed on the right.  The 5 mm assist trocar was exchanged for a 10-12 mm port. All ports were placed under direct visualization.  The patient was placed in steep Trendelenburg.  Bowel was folded away into the upper abdomen.  The robot was docked in the normal manner.  The right and left peritoneum were opened parallel to the IP ligament to open the retroperitoneal spaces bilaterally. The round ligaments were transected. The SLN mapping was performed in bilateral pelvic basins. After identifying the ureters, the para rectal and paravesical spaces were opened up entirely with careful dissection below the level of the ureters bilaterally and to the depth of the uterine artery origin in order to skeletonize the uterine "web" and ensure visualization of all parametrial channels. The para-aortic basins were carefully exposed and evaluated for isolated para-aortic SLN's. Lymphatic channels were identified travelling to the following visualized sentinel lymph node's: right deep obturator and left external iliac. These SLN's were separated from their surrounding lymphatic tissue, removed and sent for permanent pathology. The SLN on the right in the deep obturator space was surround by several other enlarged and tumor-filled lymph nodes that were removed concurrently. On the left, an prominent and tumor-involved obturator lymph node was removed after resection of the sentinel lymph node.  The hysterectomy was started.  The ureter was again noted to be on the medial leaf of the broad ligament.  The peritoneum above the ureter was incised and stretched and the infundibulopelvic ligament was skeletonized, cauterized and cut.  The posterior peritoneum was taken down to the level of the KOH ring.  The anterior peritoneum was also taken down.  The bladder flap was created to the level of the KOH ring.  The uterine  artery on the right side was skeletonized, cauterized and cut in the normal manner.  A similar procedure was performed on the left.  The colpotomy was made and the uterus, cervix, bilateral ovaries and tubes were amputated. The uterine specimen was placed in an Endocatch bag to be removed at the end of surgery.  Pedicles were inspected and excellent hemostasis was achieved.    A right common iliac and para-aortic lymph node dissection was then performed given grossly involved lymph nodes with the following borders: distally the common iliac, medially the aorta and IVC, laterally the psoas and the proximal border was inferior to the renal vasculature. All involved lymphatic tissue was removed and sent to Pathology. Pressure and Floseal were used to achieve hemostasis of a small vein just superior to the IVC. Arista was placed over all nodal beds.  The colpotomy at the vaginal cuff was closed with Vicryl on a CT1 needle in a running manner.  Irrigation was used and excellent hemostasis was achieved.  At this point in the procedure was completed.  Robotic instruments were removed under direct visulaization.  The robot was undocked.   With the abdomen insufflated, the supraumbilical incision was extended to a length of 6-8cm. The incision was carried down to the fascia. The fascia and peritoneum were extended. The uterine specimen was delivered through the incision in the Endocatch bag. This was handed off the field to be  sent to pathology. The fascia was closed with running looped 0 PDS tied in the midline. The incision was irrigated and marcaine was injected for local anesthesia. The subcutaneous tissue was reapproximated with 2-0 Vicryl.   The fascia at the 10-12 mm port was closed with 0 Vicryl on a UR-5 needle.  The subcuticular tissue at all incisions was closed with 4-0 Vicryl and the skin was closed with 4-0 Monocryl in a subcuticular manner.  Dermabond was applied.  A honeycomb dressing was applied to  the mini-laparotomy.  The vagina was swabbed with  minimal bleeding noted.   All sponge, lap and needle counts were correct x  3. Foley catheter was removed.  The patient was transferred to the recovery room in stable condition.  Jeral Pinch, MD

## 2019-09-09 NOTE — Anesthesia Procedure Notes (Signed)
Procedure Name: Intubation Date/Time: 09/09/2019 7:27 AM Performed by: Eben Burow, CRNA Pre-anesthesia Checklist: Patient identified, Emergency Drugs available, Suction available, Patient being monitored and Timeout performed Patient Re-evaluated:Patient Re-evaluated prior to induction Oxygen Delivery Method: Circle system utilized Preoxygenation: Pre-oxygenation with 100% oxygen Induction Type: IV induction Ventilation: Mask ventilation without difficulty Laryngoscope Size: Mac and 4 Grade View: Grade I Tube type: Oral Tube size: 7.0 mm Number of attempts: 1 Airway Equipment and Method: Stylet Placement Confirmation: ETT inserted through vocal cords under direct vision,  positive ETCO2 and breath sounds checked- equal and bilateral Secured at: 22 cm Tube secured with: Tape Dental Injury: Teeth and Oropharynx as per pre-operative assessment

## 2019-09-09 NOTE — Discharge Instructions (Signed)
09/09/2019  Plan to start your lovenox (blood thinner to prevent a blood clot) on Saturday, 2 days after surgery.  Activity: 1. Be up and out of the bed during the day.  Take a nap if needed.  You may walk up steps but be careful and use the hand rail.  Stair climbing will tire you more than you think, you may need to stop part way and rest.   2. No lifting or straining for 6 weeks.  3. No driving for 1-2 weeks.  Do Not drive if you are taking narcotic pain medicine and can brake safely.  4. Shower daily.  Use soap and water on your incision and pat dry; don't rub.   5. No sexual activity and nothing in the vagina for 8 weeks.  Medications:  - Take ibuprofen and tylenol first line for pain control. Take these regularly (every 6 hours) to decrease the build up of pain.  - If necessary, for severe pain not relieved by ibuprofen, take oxycodone.  - While taking percocet you should take sennakot every night to reduce the likelihood of constipation. If this causes diarrhea, stop its use.  Diet: 1. Low sodium Heart Healthy Diet is recommended.  2. It is safe to use a laxative if you have difficulty moving your bowels.   Wound Care: 1. Keep clean and dry.  Shower daily.  Reasons to call the Doctor:   Fever - Oral temperature greater than 100.4 degrees Fahrenheit  Foul-smelling vaginal discharge  Difficulty urinating  Nausea and vomiting  Increased pain at the site of the incision that is unrelieved with pain medicine.  Difficulty breathing with or without chest pain  New calf pain especially if only on one side  Sudden, continuing increased vaginal bleeding with or without clots.   Follow-up: 1. See Jeral Pinch in 3 weeks.  Contacts: For questions or concerns you should contact:  Dr. Jeral Pinch at [phone] After hours and on week-ends call [phone] and ask to speak to the physician on call for Gynecologic Oncology  After Your Surgery  The  information in this section will tell you what to expect after your surgery, both during your stay and after you leave. You will learn how to safely recover from your surgery. Write down any questions you have and be sure to ask your doctor or nurse.  What to Expect When you wake up after your surgery, you will be in the [facility] (PACU) or your recovery room. A nurse will be monitoring your body temperature, blood pressure, pulse, and oxygen levels. You may have a urinary catheter in your bladder to help monitor the amount of urine you are making. It should come out before you go home. You will also have compression boots on your lower legs to help your circulation. Your pain medication will be given through an IV line or in tablet form. If you are having pain, tell your nurse. Your nurse will tell you how to recover from your surgery. Below are examples of ways you can help yourself recover safely. . You will be encouraged to walk with the help of your nurse or physical therapist. We will give you medication to relieve pain. Walking helps reduce the risk for blood clots and pneumonia. It also helps to stimulate your bowels so they begin working again. . Use your incentive spirometer. This will help your lungs expand, which prevents pneumonia.   Commonly Asked Questions  Will I have pain after  surgery? Yes, you will have some pain after your surgery, especially in the first few days. Your doctor and nurse will ask you about your pain often. You will be given medication to manage your pain as needed. If your pain is not relieved, please tell your doctor or nurse. It is important to control your pain so you can cough, breathe deeply, use your incentive spirometer, and get out of bed and walk.  Will I be able to eat? Yes, you will be able to eat a regular diet or eat as tolerated. You should start with foods that are soft and easy to digest such as apple sauce and chicken noodle soup.  Eat small meals frequently, and then advance to regular foods. If you experience bloating, gas, or cramps, limit high-fiber foods, including whole grain breads and cereal, nuts, seeds, salads, fresh fruit, broccoli, cabbage, and cauliflower. Will I have pain when I am home? The length of time each person has pain or discomfort varies. You may still have some pain when you go home and will probably be taking pain medication. Follow the guidelines below. . Take your medications as directed and as needed. . Call your doctor if the medication prescribed for you doesn't relieve your pain. . Don't drive or drink alcohol while you're taking prescription pain medication. . As your incision heals, you will have less pain and need less pain medication. A mild pain reliever such as acetaminophen (Tylenol) or ibuprofen (Advil) will relieve aches and discomfort. However, large quantities of acetaminophen may be harmful to your liver. Don't take more acetaminophen than the amount directed on the bottle or as instructed by your doctor or nurse. . Pain medication should help you as you resume your normal activities. Take enough medication to do your exercises comfortably. Pain medication is most effective 30 to 45 minutes after taking it. Marland Kitchen Keep track of when you take your pain medication. Taking it when your pain first begins is more effective than waiting for the pain to get worse. Pain medication may cause constipation (having fewer bowel movements than what is normal for you).  How can I prevent constipation? . Go to the bathroom at the same time every day. Your body will get used to going at that time. . If you feel the urge to go, don't put it off. Try to use the bathroom 5 to 15 minutes after meals. . After breakfast is a Breton Berns time to move your bowels. The reflexes in your colon are strongest at this time. . Exercise, if you can. Walking is an excellent form of exercise. . Drink 8 (8-ounce) glasses (2  liters) of liquids daily, if you can. Drink water, juices, soups, ice cream shakes, and other drinks that don't have caffeine. Drinks with caffeine, such as coffee and soda, pull fluid out of the body. . Slowly increase the fiber in your diet to 25 to 35 grams per day. Fruits, vegetables, whole grains, and cereals contain fiber. If you have an ostomy or have had recent bowel surgery, check with your doctor or nurse before making any changes in your diet. . Both over-the-counter and prescription medications are available to treat constipation. Start with 1 of the following over-the-counter medications first: o Docusate sodium (Colace) 100 mg. Take ___1__ capsules _2____ times a day. This is a stool softener that causes few side effects. Don't take it with mineral oil. o Polyethylene glycol (MiraLAX) 17 grams daily. o Senna (Senokot) 2 tablets at bedtime. This is a  stimulant laxative, which can cause cramping. . If you haven't had a bowel movement in 2 days, call your doctor or nurse.  Can I shower? Yes, you should shower 24 hours after your surgery. Be sure to shower every day. Taking a warm shower is relaxing and can help decrease muscle aches. Use soap when you shower and gently wash your incision. Pat the areas dry with a towel after showering, and leave your incision uncovered (unless there is drainage). Call your doctor if you see any redness or drainage from your incision. Don't take tub baths until you discuss it with your doctor at the first appointment after your surgery. How do I care for my incisions? You will have several small incisions on your abdomen. The incisions are closed with Steri-Strips or Dermabond. You may also have square white dressings on your incisions ([place]). You can remove these in the shower 24 hours after your surgery. You should clean your incisions with soap and water. If you go home with Steri-Strips on your incision, they will loosen and may fall off by  themselves. If they haven't fallen off within 10 days, you can remove them. If you go home with Dermabond over your sutures (stitches), it will also loosen and peel off.  What are the most common symptoms after a hysterectomy? It's common for you to have some vaginal spotting or light bleeding. You should monitor this with a pad or a panty liner. If you have having heavy bleeding (bleeding through a pad or liner every 1 to 2 hours), call your doctor right away. It's also common to have some discomfort after surgery from the air that was pumped into your abdomen during surgery. To help with this, walk, drink plenty of liquids and make sure to take the stool softeners you received.  When is it safe for me to drive? You may resume driving 2 weeks after surgery, as long as you are not taking pain medication that may make you drowsy.  When can I resume sexual activity? Do not place anything in your vagina or have vaginal intercourse for 8 weeks after your surgery. Some people will need to wait longer than 8 weeks, so speak with your doctor before resuming sexual intercourse.  Will I be able to travel? Yes, you can travel. If you are traveling by plane within a few weeks after your surgery, make sure you get up and walk every hour. Be sure to stretch your legs, drink plenty of liquids, and keep your feet elevated when possible.  Will I need any supplies? Most people do not need any supplies after the surgery. In the rare case that you do need supplies, such as tubes or drains, your nurse will order them for you.  When can I return to work? The time it takes to return to work depends on the type of work you do, the type of surgery you had, and how fast your body heals. Most people can return to work about 2 to 4 weeks after the surgery.  What exercises can I do? Exercise will help you gain strength and feel better. Walking and stair climbing are excellent forms of exercise. Gradually increase the  distance you walk. Climb stairs slowly, resting or stopping as needed. Ask your doctor or nurse before starting more strenuous exercises.  When can I lift heavy objects? Most people should not lift anything heavier than 10 pounds (4.5 kilograms) for at least 4 weeks after surgery. Speak with your doctor about  when you can do heavy lifting.  How can I cope with my feelings? After surgery for a serious illness, you may have new and upsetting feelings. Many people say they felt weepy, sad, worried, nervous, irritable, and angry at one time or another. You may find that you can't control some of these feelings. If this happens, it's a Jersey Espinoza idea to seek emotional support. The first step in coping is to talk about how you feel. Family and friends can help. Your nurse, doctor, and social worker can reassure, support, and guide you. It's always a Leviathan Macera idea to let these professionals know how you, your family, and your friends are feeling emotionally. Many resources are available to patients and their families. Whether you're in the hospital or at home, the nurses, doctors, and social workers are here to help you and your family and friends handle the emotional aspects of your illness.  When is my first appointment after surgery? Your first appointment after surgery will be 2 to 4 weeks after surgery. Your nurse will give you instructions on how to make this appointment, including the phone number to call.  What if I have other questions? If you have any questions or concerns, please talk with your doctor or nurse. You can reach them Monday through Friday from 9:00 am to 5:00 pm. After 5:00 pm, during the weekend, and on holidays, call [phone] and ask for the doctor on call for your doctor.  . Have a temperature of 101 F (38.3 C) or higher . Have pain that does not get better with pain medication . Have redness, drainage, or swelling from your incisions   General Anesthesia, Adult, Care  After This sheet gives you information about how to care for yourself after your procedure. Your health care provider may also give you more specific instructions. If you have problems or questions, contact your health care provider. What can I expect after the procedure? After the procedure, the following side effects are common:  Pain or discomfort at the IV site.  Nausea.  Vomiting.  Sore throat.  Trouble concentrating.  Feeling cold or chills.  Weak or tired.  Sleepiness and fatigue.  Soreness and body aches. These side effects can affect parts of the body that were not involved in surgery. Follow these instructions at home:  For at least 24 hours after the procedure:  Have a responsible adult stay with you. It is important to have someone help care for you until you are awake and alert.  Rest as needed.  Do not: ? Participate in activities in which you could fall or become injured. ? Drive. ? Use heavy machinery. ? Drink alcohol. ? Take sleeping pills or medicines that cause drowsiness. ? Make important decisions or sign legal documents. ? Take care of children on your own. Eating and drinking  Follow any instructions from your health care provider about eating or drinking restrictions.  When you feel hungry, start by eating small amounts of foods that are soft and easy to digest (bland), such as toast. Gradually return to your regular diet.  Drink enough fluid to keep your urine pale yellow.  If you vomit, rehydrate by drinking water, juice, or clear broth. General instructions  If you have sleep apnea, surgery and certain medicines can increase your risk for breathing problems. Follow instructions from your health care provider about wearing your sleep device: ? Anytime you are sleeping, including during daytime naps. ? While taking prescription pain medicines, sleeping medicines, or  medicines that make you drowsy.  Return to your normal activities as told  by your health care provider. Ask your health care provider what activities are safe for you.  Take over-the-counter and prescription medicines only as told by your health care provider.  If you smoke, do not smoke without supervision.  Keep all follow-up visits as told by your health care provider. This is important. Contact a health care provider if:  You have nausea or vomiting that does not get better with medicine.  You cannot eat or drink without vomiting.  You have pain that does not get better with medicine.  You are unable to pass urine.  You develop a skin rash.  You have a fever.  You have redness around your IV site that gets worse. Get help right away if:  You have difficulty breathing.  You have chest pain.  You have blood in your urine or stool, or you vomit blood. Summary  After the procedure, it is common to have a sore throat or nausea. It is also common to feel tired.  Have a responsible adult stay with you for the first 24 hours after general anesthesia. It is important to have someone help care for you until you are awake and alert.  When you feel hungry, start by eating small amounts of foods that are soft and easy to digest (bland), such as toast. Gradually return to your regular diet.  Drink enough fluid to keep your urine pale yellow.  Return to your normal activities as told by your health care provider. Ask your health care provider what activities are safe for you. This information is not intended to replace advice given to you by your health care provider. Make sure you discuss any questions you have with your health care provider. Document Revised: 01/03/2017 Document Reviewed: 08/16/2016 Elsevier Patient Education  [place].

## 2019-09-09 NOTE — Brief Op Note (Signed)
09/09/2019  10:56 AM  PATIENT:  Regina Porter  85 y.o. female  PRE-OPERATIVE DIAGNOSIS:  ENDOMETRIAL CANCER  POST-OPERATIVE DIAGNOSIS:  ENDOMETRIAL CANCER  PROCEDURE:  Procedure(s): XI ROBOTIC ASSISTED TOTAL HYSTERECTOMY WITH BILATERAL SALPINGO OOPHORECTOMY (Bilateral) SENTINEL NODE BIOPSY AND POSSIBLE LYMPH NODE DISECTION AND POSSIBLE LAPAROTOMY (N/A)  SURGEON:  Surgeon(s) and Role:    Lafonda Mosses, MD - Primary    Lahoma Crocker, MD - Assisting  ANESTHESIA:   general  EBL:  150 mL   BLOOD ADMINISTERED:none  DRAINS: none   LOCAL MEDICATIONS USED:  MARCAINE     SPECIMEN:  Uterus, cervix, tubes, ovaries, bilateral sentinel lymph nodes, enlarged right obturator, external iliac, common iliac, and para-aortic lymph nodes  DISPOSITION OF SPECIMEN:  PATHOLOGY  COUNTS:  YES  TOURNIQUET:  * No tourniquets in log *  DICTATION: .Note written in EPIC  PLAN OF CARE: Discharge to home after PACU  PATIENT DISPOSITION:  PACU - hemodynamically stable.   Delay start of Pharmacological VTE agent (>24hrs) due to surgical blood loss or risk of bleeding: not applicable

## 2019-09-10 ENCOUNTER — Encounter (HOSPITAL_COMMUNITY): Payer: Self-pay | Admitting: Gynecologic Oncology

## 2019-09-10 ENCOUNTER — Telehealth: Payer: Self-pay

## 2019-09-10 LAB — CA 125: Cancer Antigen (CA) 125: 181 U/mL — ABNORMAL HIGH (ref 0.0–38.1)

## 2019-09-10 NOTE — Telephone Encounter (Signed)
Spoke with daughter Elder Love. Ms welshans is eating,drinking, and urinating well. Told daughter to be sure her mother takes in at least 87 oz of water,especially due to her decreased kidney function. Afebrile Incisions D&I.  Told daughter that the honeycomb dressing can come off 5 days after her surgery which would be Tuesday 8-32-21. Pt passing gas. Told daughter that her mother should begin the senokot-s 2 tabs bid with 8 oz of water. She needs to be up walking around at least every 1-1/2 hrs to help prevent blood clots and help healing. Pt using tylenol for pain with occasional Tramadol.  No Ibuprofen d/t kidney function. Daughter aware of the post op appointments as well as the after hours and weekend number to call if there are any questions  office number [phone]

## 2019-09-10 NOTE — Anesthesia Postprocedure Evaluation (Signed)
Anesthesia Post Note  Patient: Regina Porter  Procedure(s) Performed: XI ROBOTIC ASSISTED TOTAL HYSTERECTOMY WITH BILATERAL SALPINGO OOPHORECTOMY (Bilateral ) SENTINEL NODE BIOPSY AND POSSIBLE LYMPH NODE DISECTION AND POSSIBLE LAPAROTOMY (N/A )     Patient location during evaluation: PACU Anesthesia Type: General Level of consciousness: awake and alert Pain management: pain level controlled Vital Signs Assessment: post-procedure vital signs reviewed and stable Respiratory status: spontaneous breathing, nonlabored ventilation and respiratory function stable Cardiovascular status: blood pressure returned to baseline and stable Postop Assessment: no apparent nausea or vomiting Anesthetic complications: no   No complications documented.  Last Vitals:  Vitals:   09/09/19 1215 09/09/19 1335  BP: (!) 173/49 (!) 168/52  Pulse: 70 75  Resp: 13 14  Temp: 36.4 C 36.5 C  SpO2: 96% 96%    Last Pain:  Vitals:   09/09/19 1335  TempSrc:   PainSc: Asleep                 Merlinda Frederick

## 2019-09-15 ENCOUNTER — Encounter: Payer: Self-pay | Admitting: Gynecologic Oncology

## 2019-09-15 ENCOUNTER — Other Ambulatory Visit: Payer: Self-pay | Admitting: Obstetrics & Gynecology

## 2019-09-15 ENCOUNTER — Inpatient Hospital Stay: Payer: Medicare Other | Attending: Gynecologic Oncology | Admitting: Gynecologic Oncology

## 2019-09-15 DIAGNOSIS — Z90710 Acquired absence of both cervix and uterus: Secondary | ICD-10-CM

## 2019-09-15 DIAGNOSIS — C778 Secondary and unspecified malignant neoplasm of lymph nodes of multiple regions: Secondary | ICD-10-CM | POA: Insufficient documentation

## 2019-09-15 DIAGNOSIS — Z853 Personal history of malignant neoplasm of breast: Secondary | ICD-10-CM | POA: Insufficient documentation

## 2019-09-15 DIAGNOSIS — C541 Malignant neoplasm of endometrium: Secondary | ICD-10-CM | POA: Insufficient documentation

## 2019-09-15 DIAGNOSIS — Z90722 Acquired absence of ovaries, bilateral: Secondary | ICD-10-CM

## 2019-09-15 DIAGNOSIS — Z87891 Personal history of nicotine dependence: Secondary | ICD-10-CM | POA: Insufficient documentation

## 2019-09-15 DIAGNOSIS — N184 Chronic kidney disease, stage 4 (severe): Secondary | ICD-10-CM | POA: Insufficient documentation

## 2019-09-15 DIAGNOSIS — Z923 Personal history of irradiation: Secondary | ICD-10-CM | POA: Insufficient documentation

## 2019-09-15 NOTE — Progress Notes (Signed)
Gynecologic Oncology Telehealth Consult Note: Gyn-Onc  I connected with Regina Porter on 09/15/19 at  3:30 PM EDT by telephone and verified that I am speaking with the correct person using two identifiers.  I discussed the limitations, risks, security and privacy concerns of performing an evaluation and management service by telemedicine and the availability of in-person appointments. I also discussed with the patient that there may be a patient responsible charge related to this service. The patient expressed understanding and agreed to proceed.  Other persons participating in the visit and their role in the encounter: Nellie, patient's daughter.  Patient's location: Home Provider's location: [facility]  Reason for Visit: Pathology review, postoperative follow-up  Treatment History: Oncology History Overview Note  MMR IHC normal   Breast cancer [facility])   Initial Diagnosis   Breast cancer ([place])   Endometrial cancer ([place])  08/23/2019 Initial Biopsy   D&C A. ENDOMETRIUM, CURETTAGE:  -  High-grade carcinoma  -  See comment  COMMENT:  The biopsy consists of a high-grade carcinoma.  Based on the biopsy the differential diagnosis includes serous, high-grade endometrioid and undifferentiated carcinoma.  These results were discussed with Dr. Assunta Curtis nurse, Juliann Pulse, on August 24, 2019.    08/27/2019 Initial Diagnosis   Endometrial cancer ([place])   09/01/2019 Imaging   CT C/A/P: 1. Enlarged uterus and heterogeneous endometrial cavity, consistent with the given diagnosis of uterine/cervical cancer. Small to borderline enlarged pelvic retroperitoneal lymph nodes are highly worrisome for metastatic disease. 2. Borderline enlarged thoracic inlet and mediastinal lymph nodes also worrisome for malignancy. 3. Tiny subpleural nodules are likely benign subpleural lymph nodes. Recommend attention on follow-up. 4. Aortic atherosclerosis (ICD10-I70.0). Coronary artery calcification.    09/09/2019 Surgery   Robotic-assisted laparoscopic total hysterectomy with bilateral salpingoophorectomy, SLN biopsy, right pelvic and para-aortic lymphadenectomy  On EUA, bulky enlarged uterus, mobile. Normal upper abdominal survey although prominent gallbladder and some filmy adhesions between the liver and anterior abdominal wall. Normal appearing omentum, small and large bowel. Uterus quite bulky and 10-12cm. Normal appearing adnexa. Mapping successful to right obturator and left external iliac SLNs. Multiple obturator lymph nodes on the right, an external iliac lymph node on the right, and lymph nodes along the common iliac and aorta on the right all enlarged, adherent to surround vessels, and filled with tumor. All grossly involved lymph nodes removed.   09/09/2019 Pathology Results   A. SENTINEL LYMPH NODE, RIGHT OBTURATOR, ADJACENT ENLARGED LYMPH NODE,  EXCISION:  - Metastatic serous carcinoma, see comment   B. LYMPH NODE, RIGHT EXTERNAL ILIAC, BIOPSY:  - Benign lymph node, negative for carcinoma (0/1)   C. SENTINEL LYMPH NODE, LEFT EXTERNAL ILIAC, BIOPSY:  - Benign lymph node, negative for carcinoma (0/1)   D. 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 UTERUS, CERVIX, BILATERAL TUBE AND OVARIES:  - Invasive high-grade serous carcinoma, 8.2 cm  - Carcinoma invades for depth of 2.0 cm with a myometrial thickness is  2.2 cm  - Carcinoma invades into the cervical stroma  - Extensive lymphovascular invasion is present  - Benign unremarkable bilateral fallopian tubes and ovaries  - See oncology table   F. 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 LYMPH NODES, RIGHT COMMON AND PERIAORTIC, RESECTION:  - Metastatic serous carcinoma, see comment      COMMENT:   A.   Lymphoid tissue is  not identified.  Findings likely represent an  entirely replaced lymph node(s).    G. Multiple fragments of lymph node(s) were received. The exact number  of lymph nodes cannot be determined but does not affect the nodal stage.     ONCOLOGY TABLE:   UTERUS, CARCINOMA OR CARCINOSARCOMA   Procedure: Total hysterectomy and bilateral salpingo-oophorectomy  Histologic type: Serous carcinoma  Histologic Grade: High-grade  Myometrial invasion:       Depth of invasion: 20 mm       Myometrial thickness: 22 mm  Uterine Serosa Involvement: Not identified  Cervical stromal involvement: Present  Extent of involvement of other organs: Not identified  Lymphovascular invasion: Present, extensive  Regional Lymph Nodes:        Examined:        At least 3 Sentinel                               At least 3 Non-sentinel                               At least 6 Total        Lymph nodes with metastasis: 4        Isolated tumor cells (<0.2 mm): 0        Micrometastasis: (>0.2 mm and < 2.0 mm): 0        Macrometastasis: (>2.0 mm): 4        Extracapsular extension: Present  Representative Tumor Block: E8  MMR / MSI testing: Will be ordered  Pathologic Stage Classification (pTNM, AJCC 8th edition):  pT2, pN2a    09/09/2019 Tumor Marker   Patient's tumor was tested for the following markers: CA-125. Results of the tumor marker test revealed: 181.   09/09/2019 Cancer Staging   Staging form: Corpus Uteri - Carcinoma and Carcinosarcoma, AJCC 8th Edition - Clinical stage from 09/09/2019: FIGO Stage IIIC2, calculated as Stage IVB (cT2, cN2a, pM1) - Signed by Carver Fila, MD on 09/15/2019     Interval History: Patient reports overall doing well since surgery.  Her appetite and oral intake is still decreased.  She has had some constipation since surgery but is using Senokot as needed.  She denies any urinary symptoms.  She denies any vaginal bleeding, discharge, fevers or chills.  Past Medical/Surgical History: Past Medical History:  Diagnosis Date  . Allergy    mild   .  Anemia   . Arthritis    pt states no pain   . Breast cancer ([facility])    left   . Chronic kidney disease    ckd stage 4 per lov dr deterding 08-03-2018 on chart  . Dementia ([facility])   . Depression   . Gastroesophageal reflux disease   . Heart murmur   . Heartburn   . Hypertension   . Hypothyroidism   . Leg cramps   . Post-menopause bleeding   . Restless leg syndrome 10/07/2014  . Sleep apnea    cpap not in use     Past Surgical History:  Procedure Laterality Date  . back sugery   2019   lower back   . BREAST LUMPECTOMY    . BREAST SURGERY Left 2013   Lumpectomy  radiation done no chemo  . CATARACT EXTRACTION, BILATERAL    . DILATATION & CURETTAGE/HYSTEROSCOPY WITH MYOSURE N/A 08/23/2019   Procedure: DILATATION & CURETTAGE/HYSTEROSCOPY WITH  MYOSURE;  Surgeon: [name], MD;  Location: [facility];  Service: Gynecology;  Laterality: N/A;  request to follow 2nd case in [place] block requests one hour  . hysteroscopy biopsy    . ROBOTIC ASSISTED TOTAL HYSTERECTOMY WITH BILATERAL SALPINGO OOPHERECTOMY Bilateral 09/09/2019   Procedure: XI ROBOTIC ASSISTED TOTAL HYSTERECTOMY WITH BILATERAL SALPINGO OOPHORECTOMY;  Surgeon: Lafonda Mosses, MD;  Location: [facility];  Service: Gynecology;  Laterality: Bilateral;  . SENTINEL NODE BIOPSY N/A 09/09/2019   Procedure: SENTINEL NODE BIOPSY AND POSSIBLE LYMPH NODE DISECTION AND POSSIBLE LAPAROTOMY;  Surgeon: Lafonda Mosses, MD;  Location: [facility];  Service: Gynecology;  Laterality: N/A;    Family History  Problem Relation Age of Onset  . Hypertension Mother   . Arthritis Mother   . Heart disease Mother   . Hypertension Father   . Heart disease Father   . Breast cancer Sister   . Lung cancer Brother   . Breast cancer Paternal Aunt   . Colon cancer Neg Hx   . Colon polyps Neg Hx   . Esophageal cancer Neg Hx   . Rectal cancer Neg Hx   . Stomach cancer Neg Hx     Social History   Socioeconomic History  .  Marital status: Widowed    Spouse name: Not on file  . Number of children: 5  . Years of education: 58  . Highest education level: Not on file  Occupational History  . Occupation: Retired  Tobacco Use  . Smoking status: Former Smoker    Packs/day: 0.10    Years: 20.00    Pack years: 2.00    Quit date: 2008    Years since quitting: 13.6  . Smokeless tobacco: Never Used  . Tobacco comment: Quit at  age 83  Vaping Use  . Vaping Use: Never used  Substance and Sexual Activity  . Alcohol use: No    Alcohol/week: 0.0 standard drinks  . Drug use: No  . Sexual activity: Not Currently  Other Topics Concern  . Not on file  Social History Narrative   Diet?       Do you drink/eat things with caffeine? yes      Marital status?                widow                    What year were you married?      Do you live in a house, apartment, assisted living, condo, trailer, etc.? house      Is it one or more stories? yes      How many persons live in your home? 2      Do you have any pets in your home? (please list) no      Current or past profession:      Do you exercise?         no                             Type & how often? none      Do you have a living will? no   Do you have a DNR form?  no                                If not, do you want to discuss  one? no      Do you have signed POA/HPOA for forms?    Social Determinants of Health   Financial Resource Strain:   . Difficulty of Paying Living Expenses: Not on file  Food Insecurity:   . Worried About Charity fundraiser in the Last Year: Not on file  . Ran Out of Food in the Last Year: Not on file  Transportation Needs:   . Lack of Transportation (Medical): Not on file  . Lack of Transportation (Non-Medical): Not on file  Physical Activity:   . Days of Exercise per Week: Not on file  . Minutes of Exercise per Session: Not on file  Stress:   . Feeling of Stress : Not on file  Social Connections:   . Frequency of  Communication with Friends and Family: Not on file  . Frequency of Social Gatherings with Friends and Family: Not on file  . Attends Religious Services: Not on file  . Active Member of Clubs or Organizations: Not on file  . Attends Archivist Meetings: Not on file  . Marital Status: Not on file    Current Medications:  Current Outpatient Medications:  .  acetaminophen (TYLENOL) 500 MG tablet, Take 1,000 mg by mouth every 6 (six) hours as needed for moderate pain. , Disp: , Rfl:  .  amLODipine (NORVASC) 10 MG tablet, TAKE 1 TABLET BY MOUTH EVERY DAY, Disp: 90 tablet, Rfl: 0 .  aspirin 81 MG EC tablet, Take 81 mg by mouth daily. , Disp: , Rfl:  .  bumetanide (BUMEX) 2 MG tablet, Take 2 mg by mouth daily., Disp: , Rfl: 6 .  busPIRone (BUSPAR) 10 MG tablet, TAKE 1 TABLET BY MOUTH TWICE DAILY .DX F41.9 APPT IS OVERDUE, Disp: 180 tablet, Rfl: 1 .  Carboxymethylcellulose Sodium (EYE DROPS OP), Place 1 drop into both eyes daily as needed (dry eyes). , Disp: , Rfl:  .  Cod Liver Oil 1000 MG CAPS, Take 1,000 mg by mouth 2 (two) times daily. , Disp: , Rfl:  .  donepezil (ARICEPT) 5 MG tablet, TAKE 1 TABLET BY MOUTH EVERYDAY AT BEDTIME DX: G31.84, Disp: 90 tablet, Rfl: 0 .  enoxaparin (LOVENOX) 30 MG/0.3ML injection, 1 syringe Why qhs start on POD #2, Disp: 4.2 mL, Rfl: 0 .  hydrALAZINE (APRESOLINE) 100 MG tablet, TAKE 1 TABLET (100 MG TOTAL) BY MOUTH 3 (THREE) TIMES DAILY., Disp: 270 tablet, Rfl: 1 .  levothyroxine (SYNTHROID) 75 MCG tablet, TAKE ONE TABLET BY MOUTH ONCE DAILY 30 MINUTES BEFORE BREAKFAST FOR THYROID, Disp: 90 tablet, Rfl: 0 .  losartan (COZAAR) 100 MG tablet, TAKE 1 TABLET BY MOUTH EVERYDAY AT BEDTIME, Disp: 90 tablet, Rfl: 1 .  metoprolol succinate (TOPROL-XL) 100 MG 24 hr tablet, Take 100 mg by mouth in the morning and at bedtime. Take with or immediately following a meal., Disp: , Rfl:  .  Multiple Vitamin (MULTIVITAMIN ADULT PO), Take 1 tablet by mouth daily., Disp: , Rfl:   .  senna-docusate (SENOKOT-S) 8.6-50 MG tablet, Take 2 tablets by mouth at bedtime. For AFTER surgery, do not take if having diarrhea, Disp: 30 tablet, Rfl: 0 .  traMADol (ULTRAM) 50 MG tablet, Take 1 tablet (50 mg total) by mouth every 12 (twelve) hours as needed for severe pain. For AFTER surgery, do not take and drive, Disp: 10 tablet, Rfl: 0 .  traZODone (DESYREL) 50 MG tablet, TAKE 1 TABLET BY MOUTH EVERYDAY AT BEDTIME, Disp: 90 tablet, Rfl: 0  Review  of Symptoms: Complete 10-system review is negative except as above in Interval History.  Physical Exam: There were no vitals taken for this visit. Deferred given limitations of phone visit.  Laboratory & Radiologic Studies: None new  Assessment & Plan: Regina Porter is a 85 y.o. 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  Patient overall seems to be healing well from surgery.  We discussed continued expectations for postoperative recovery.  I encouraged her to increase her water and food intake as possible.  I reviewed with the patient and her daughter the pathology results in detail.  Unfortunately, multiple of the lymph nodes excised from the abdomen and pelvis were positive for cancer.  This was evident at the time of surgery given gross involvement.  I was able to cytoreduce all enlarged appearing pelvic and periaortic lymph nodes.  Given pathology findings, I am concerned that borderline enlarged and prominent nodes in the chest also represent metastatic disease.  Given the patient's age and comorbidities, she is not an ideal candidate for systemic therapy.  We would typically proceed with carboplatin and paclitaxel given concern for metastatic disease.  I discussed that it is likely she would be a better candidate for single agent carboplatin if she and Dr. Alvy Bimler decide that she is strong and healthy enough to proceed  with treatment.  I will asked the pathologist to add on HER-2 testing given serous histology.  All the patient and her daughter's questions were answered today.  I discussed the assessment and treatment plan with the patient. The patient was provided with an opportunity to ask questions and all were answered. The patient agreed with the plan and demonstrated an understanding of the instructions.   The patient was advised to call back or see an in-person evaluation if the symptoms worsen or if the condition fails to improve as anticipated.   22 minutes of total time was spent for this patient encounter, including preparation, face-to-face counseling with the patient and coordination of care, and documentation of the encounter.   Jeral Pinch, MD  Division of Gynecologic Oncology  Department of Obstetrics and Gynecology  [facility]

## 2019-09-16 ENCOUNTER — Telehealth: Payer: Self-pay | Admitting: *Deleted

## 2019-09-16 NOTE — Telephone Encounter (Signed)
Called and gave the patient's daughter the appt for 9/15

## 2019-09-21 LAB — SURGICAL PATHOLOGY

## 2019-09-23 ENCOUNTER — Telehealth (INDEPENDENT_AMBULATORY_CARE_PROVIDER_SITE_OTHER): Payer: Medicare Other | Admitting: Nurse Practitioner

## 2019-09-23 ENCOUNTER — Telehealth: Payer: Self-pay

## 2019-09-23 ENCOUNTER — Other Ambulatory Visit: Payer: Self-pay

## 2019-09-23 DIAGNOSIS — R197 Diarrhea, unspecified: Secondary | ICD-10-CM

## 2019-09-23 DIAGNOSIS — R531 Weakness: Secondary | ICD-10-CM

## 2019-09-23 DIAGNOSIS — C541 Malignant neoplasm of endometrium: Secondary | ICD-10-CM | POA: Diagnosis not present

## 2019-09-23 DIAGNOSIS — R2689 Other abnormalities of gait and mobility: Secondary | ICD-10-CM

## 2019-09-23 NOTE — Progress Notes (Signed)
Careteam: Patient Care Team: [name], NP as PCP - General (Geriatric Medicine) Jettie Booze, MD as PCP - Cardiology (Cardiology) Dustin Folks, MD as Consulting Physician (Hematology and Oncology) Esperanza Heir, MD as Consulting Physician (Obstetrics and Gynecology) Consuella Lose, MD as Consulting Physician (Neurosurgery) Deterding, Jeneen Rinks, MD as Consulting Physician (Nephrology)  Advanced Directive information    Allergies  Allergen Reactions  . Shellfish Allergy Swelling and Rash    MOUTH  . Neomy-Bacit-Polymyx-Pramoxine     UNSPECIFIED REACTION  Not sure which antibiotic she has a reaction to    Chief Complaint  Patient presents with  . Acute Visit    Discuss getting patient help at home. Pateint needs help with bathing, getting in/out shower and help with light housekeeping and medication maintenance. Assitance with getting up and down stairs.Patient's daughter Elder Love will be on video with patient.     HPI: Patient is a 85 y.o. female with newly diagnosised endometrial cancer. She is following with gyn oncology. Reports she is very weak. Reports she is feeling sore but better.  Weakness after surgery. Using a cane after surgery.  Pt lives with daughter currently. She is having trouble with doing things like getting into shower, needing help with getting dressed. Reports she has issues with balance, fatigue and weakness.   Not eating or drinking well. Reports no appetite, full easily.  Also with diarrhea over the last few weeks and food just runs through her. Diarrhea for 2 weeks. Reports some consistency but mostly liquid.  Reports she does feel somewhat bloated in the abdomen.  Reports she had not had a BM for several days after surgery and she took some laxative and bowels got runny and then they would not stopped. Only took 2 doses of senna-docusate.  No antibiotics No abdominal pain or tenderness (other than surgery site) No  nausea or vomiting.  Review of Systems:  Review of Systems  Constitutional: Positive for malaise/fatigue. Negative for chills, fever and weight loss.  Respiratory: Negative for shortness of breath.   Cardiovascular: Negative for chest pain and palpitations.  Gastrointestinal: Positive for diarrhea. Negative for abdominal pain, constipation, heartburn, nausea and vomiting.  Genitourinary: Negative for dysuria, frequency and urgency.  Musculoskeletal: Negative for back pain, falls, joint pain and myalgias.  Skin: Negative.   Neurological: Positive for weakness. Negative for dizziness and headaches.    Past Medical History:  Diagnosis Date  . Allergy    mild   . Anemia   . Arthritis    pt states no pain   . Breast cancer ([place])    left   . Chronic kidney disease    ckd stage 4 per lov dr deterding 08-03-2018 on chart  . Dementia ([place])   . Depression   . Gastroesophageal reflux disease   . Heart murmur   . Heartburn   . Hypertension   . Hypothyroidism   . Leg cramps   . Post-menopause bleeding   . Restless leg syndrome 10/07/2014  . Sleep apnea    cpap not in use    Past Surgical History:  Procedure Laterality Date  . back sugery   2019   lower back   . BREAST LUMPECTOMY    . BREAST SURGERY Left 2013   Lumpectomy  radiation done no chemo  . CATARACT EXTRACTION, BILATERAL    . DILATATION & CURETTAGE/HYSTEROSCOPY WITH MYOSURE N/A 08/23/2019   Procedure: [place];  Surgeon: [name], MD;  Location: [facility];  Service: Gynecology;  Laterality: N/A;  request to follow 2nd case in [place] block requests one hour  . hysteroscopy biopsy    . ROBOTIC ASSISTED TOTAL HYSTERECTOMY WITH BILATERAL SALPINGO OOPHERECTOMY Bilateral 09/09/2019   Procedure: XI ROBOTIC ASSISTED TOTAL HYSTERECTOMY WITH BILATERAL SALPINGO OOPHORECTOMY;  Surgeon: Lafonda Mosses, MD;  Location: [facility];  Service: Gynecology;  Laterality:  Bilateral;  . SENTINEL NODE BIOPSY N/A 09/09/2019   Procedure: SENTINEL NODE BIOPSY AND POSSIBLE LYMPH NODE DISECTION AND POSSIBLE LAPAROTOMY;  Surgeon: Lafonda Mosses, MD;  Location: [facility];  Service: Gynecology;  Laterality: N/A;   Social History:   reports that she quit smoking about 13 years ago. She has a 2.00 pack-year smoking history. She has never used smokeless tobacco. She reports that she does not drink alcohol and does not use drugs.  Family History  Problem Relation Age of Onset  . Hypertension Mother   . Arthritis Mother   . Heart disease Mother   . Hypertension Father   . Heart disease Father   . Breast cancer Sister   . Lung cancer Brother   . Breast cancer Paternal Aunt   . Colon cancer Neg Hx   . Colon polyps Neg Hx   . Esophageal cancer Neg Hx   . Rectal cancer Neg Hx   . Stomach cancer Neg Hx     Medications: Patient's Medications  New Prescriptions   No medications on file  Previous Medications   ACETAMINOPHEN (TYLENOL) 500 MG TABLET    Take 1,000 mg by mouth every 6 (six) hours as needed for moderate pain.    AMLODIPINE (NORVASC) 10 MG TABLET    TAKE 1 TABLET BY MOUTH EVERY DAY   ASPIRIN 81 MG EC TABLET    Take 81 mg by mouth daily.    BUMETANIDE (BUMEX) 2 MG TABLET    Take 2 mg by mouth daily.   BUSPIRONE (BUSPAR) 10 MG TABLET    TAKE 1 TABLET BY MOUTH TWICE DAILY .DX F41.9 APPT IS OVERDUE   CARBOXYMETHYLCELLULOSE SODIUM (EYE DROPS OP)    Place 1 drop into both eyes daily as needed (dry eyes).    COD LIVER OIL 1000 MG CAPS    Take 1,000 mg by mouth 2 (two) times daily.    DONEPEZIL (ARICEPT) 5 MG TABLET    TAKE 1 TABLET BY MOUTH EVERYDAY AT BEDTIME DX: G31.84   ENOXAPARIN (LOVENOX) 30 MG/0.3ML INJECTION    1 syringe [place] qhs start on POD #2   HYDRALAZINE (APRESOLINE) 100 MG TABLET    TAKE 1 TABLET (100 MG TOTAL) BY MOUTH 3 (THREE) TIMES DAILY.   LEVOTHYROXINE (SYNTHROID) 75 MCG TABLET    TAKE ONE TABLET BY MOUTH ONCE DAILY 30 MINUTES BEFORE BREAKFAST  FOR THYROID   LOSARTAN (COZAAR) 100 MG TABLET    TAKE 1 TABLET BY MOUTH EVERYDAY AT BEDTIME   METOPROLOL SUCCINATE (TOPROL-XL) 100 MG 24 HR TABLET    Take 100 mg by mouth in the morning and at bedtime. Take with or immediately following a meal.   MULTIPLE VITAMIN (MULTIVITAMIN ADULT PO)    Take 1 tablet by mouth daily.   SENNA-DOCUSATE (SENOKOT-S) 8.6-50 MG TABLET    Take 2 tablets by mouth at bedtime. For AFTER surgery, do not take if having diarrhea   TRAMADOL (ULTRAM) 50 MG TABLET    Take 1 tablet (50 mg total) by mouth every 12 (twelve) hours as needed for severe pain. For AFTER surgery,  do not take and drive   TRAZODONE (DESYREL) 50 MG TABLET    TAKE 1 TABLET BY MOUTH EVERYDAY AT BEDTIME  Modified Medications   No medications on file  Discontinued Medications   No medications on file    Physical Exam:  There were no vitals filed for this visit. There is no height or weight on file to calculate BMI. Wt Readings from Last 3 Encounters:  09/09/19 182 lb (82.6 kg)  09/07/19 182 lb (82.6 kg)  09/06/19 182 lb (82.6 kg)    Physical Exam unable to due to virtual visit.   Labs reviewed: Basic Metabolic Panel: Recent Labs    08/23/19 1029 08/31/19 1442 09/06/19 1346  NA 143 139 140  K 4.3 4.7 4.6  CL 106 107 108  CO2  --  23 22  GLUCOSE 95 95 95  BUN 21 24 25*  CREATININE 2.10* 1.90* 2.05*  CALCIUM  --  9.0 9.1  TSH  --  0.47  --    Liver Function Tests: Recent Labs    08/31/19 1442 09/06/19 1346  AST 26 21  ALT 15 15  ALKPHOS  --  39  BILITOT 0.3 0.4  PROT 6.9 7.5  ALBUMIN  --  3.5   No results for input(s): LIPASE, AMYLASE in the last 8760 hours. No results for input(s): AMMONIA in the last 8760 hours. CBC: Recent Labs    09/24/18 1031 03/10/19 1104 08/23/19 1025 08/23/19 1025 08/23/19 1029 08/31/19 1442 09/06/19 1346  WBC 4.2   < > 6.2  --   --  5.0 4.9  NEUTROABS 1.7  --   --   --   --  3,065  --   HGB 12.0   < > 10.5*   < > 12.6 9.7* 9.4*  HCT  35.2*   < > 32.8*   < > 37.0 29.3* 29.3*  MCV 92.6   < > 92.4  --   --  89.6 93.0  PLT 261.0   < > 439*  --   --  282 299   < > = values in this interval not displayed.   Lipid Panel: Recent Labs    08/31/19 1442  CHOL 157  HDL 35*  LDLCALC 102*  TRIG 100  CHOLHDL 4.5   TSH: Recent Labs    08/31/19 1442  TSH 0.47   A1C: No results found for: HGBA1C   Assessment/Plan 1. Endometrial cancer [facility]) -following with gyn oncology, s/p Robotic-assisted laparoscopic total hysterectomy with bilateral salpingoophorectomy, SLN biopsy, right pelvic and para-aortic lymphadenectomy. Has appt with oncology schedule on 9/15 for treatment plan.  - Ambulatory referral to Home Health  2. Diarrhea, unspecified type -since surgery, reviewed medications with pt. Not on any stool softeners or laxatives, will get lab work and xray for further evaluation at this time. Discussed increasing fiber, can use benefiber daily  - CBC with Differential/Platelet; Future - CMP with eGFR([facility]); Future - DG Abd 1 View - Amylase; Future - Lipase; Future - Ova and Parasite Exam; Future - Clostridium difficile culture-fecal; Future - Stool culture; Future  Weakness Ongoing post op, recommended proper nutrition, will also get home health pt/ot for evaluation and treatment. - Ambulatory referral to [facility]  4. Imbalance -worsening balance issues post-op, uses a cane. Home health for PT/OT evaluation for home safety and safety devices.  - Ambulatory referral to [place]  Next appt: 12/31/2019 sooner if needed Janett Billow K. [place], [facility]  [phone]    Virtual Visit via video visit  I connected with patient on 09/23/19 at 11:00 AM EDT by telephone and verified that I am speaking with the correct person using two identifiers.  Location: Patient: home Provider: [facility]   I discussed the limitations, risks, security and privacy concerns of  performing an evaluation and management service by telephone and the availability of in person appointments. I also discussed with the patient that there may be a patient responsible charge related to this service. The patient expressed understanding and agreed to proceed.   I discussed the assessment and treatment plan with the patient. The patient was provided an opportunity to ask questions and all were answered. The patient agreed with the plan and demonstrated an understanding of the instructions.   The patient was advised to call back or seek an in-person evaluation if the symptoms worsen or if the condition fails to improve as anticipated.  I provided 20 minutes of non-face-to-face time during this encounter.  Carlos American. Harle Battiest Avs printed and mailed

## 2019-09-23 NOTE — Progress Notes (Signed)
This service is provided via telemedicine  No vital signs collected/recorded due to the encounter was a telemedicine visit.   Location of patient (ex: home, work):  Home  Patient consents to a telephone visit:  Yes, see encounter dated 08/09/2019  Location of the provider (ex: office, home):  [place]  Name of any referring provider:  N/A  Names of all persons participating in the telemedicine service and their role in the encounter:  Sherrie Mustache, Nurse Practitioner, Carroll Kinds, CMA, patient, daughter Elder Love.  Time spent on call:  10 minutes with medical assistant

## 2019-09-23 NOTE — Telephone Encounter (Signed)
Made first attempt to call patient at 10:17 am with no answer. Left voicemail for patient letting her know that I would call back. Called [phone]  2nd attempt to reach patient at 10:38 am. No answer, left vm stating I would call back. Also left message stating that I would be calling from a [place] or [place] number. Called [phone]  [facility] and spoke with daughter, [name], she will call [name],daughter who will be on call, and let her know that I have tried calling twice and that it will be a [place] or [place] number that may come up.  Patient finally answered phone at 11:00 am. Daughter stated she had seen where I had called, but didn't answer because of the [place] number. Called [phone]

## 2019-09-23 NOTE — Patient Instructions (Signed)
To make appt in office for lab work- also needs to collect stool sample  To walk into [facility] imaging location for xray   Important to maintain proper nutrition and fluid intake to help promote strength and healing.

## 2019-09-27 ENCOUNTER — Other Ambulatory Visit: Payer: Self-pay | Admitting: Oncology

## 2019-09-27 NOTE — Progress Notes (Signed)
Gynecologic Oncology Multi-Disciplinary Disposition Conference Note  Date of the Conference: 09/27/2019  Patient Name: Regina Porter  Referring Provider: Dr. Dellis Filbert Primary GYN Oncologist: Dr. 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 Disposition is to adjuvant chemotherapy with carboplatin and consideration for vaginal brachytherapy.   This Multidisciplinary conference took place involving physicians from [place], [place], Radiation Oncology, Pathology, Radiology along with the Gynecologic Oncology Nurse Practitioner and RN.  Comprehensive assessment of the patient's malignancy, staging, need for surgery, chemotherapy, radiation therapy, and need for further testing were reviewed. Supportive measures, both inpatient and following discharge were also discussed. The recommended plan of care is documented. Greater than 35 minutes were spent correlating and coordinating this patient's care.

## 2019-09-28 ENCOUNTER — Telehealth: Payer: Self-pay | Admitting: Oncology

## 2019-09-28 ENCOUNTER — Telehealth: Payer: Self-pay | Admitting: *Deleted

## 2019-09-28 NOTE — Telephone Encounter (Signed)
[name] Notified and agreed.  Stated that she will call us back to schedule an appointment.

## 2019-09-28 NOTE — Telephone Encounter (Signed)
Possibly would recommend a visit for further evaluation

## 2019-09-28 NOTE — Telephone Encounter (Addendum)
Called Elder Love (daughter) and advised of GYN cancer conference recommendations from yesterday. Also reviewed upcoming appointments for tomorrow.  She verbalized understanding and agreement.

## 2019-09-28 NOTE — Telephone Encounter (Signed)
[name], Caregiver called and stated that patient has been itching for almost a month now. All over. No other symptoms.  Patient is wanting to know if it would be coming from any of her medications.  Please Advise.

## 2019-09-29 ENCOUNTER — Other Ambulatory Visit: Payer: Self-pay

## 2019-09-29 ENCOUNTER — Encounter: Payer: Self-pay | Admitting: Gynecologic Oncology

## 2019-09-29 ENCOUNTER — Ambulatory Visit
Admission: RE | Admit: 2019-09-29 | Discharge: 2019-09-29 | Disposition: A | Discharge status: Home / Self Care | Payer: Medicare Other | Source: Ambulatory Visit | Attending: Nurse Practitioner | Admitting: Nurse Practitioner

## 2019-09-29 ENCOUNTER — Inpatient Hospital Stay (HOSPITAL_BASED_OUTPATIENT_CLINIC_OR_DEPARTMENT_OTHER): Payer: Medicare Other | Admitting: Hematology and Oncology

## 2019-09-29 ENCOUNTER — Other Ambulatory Visit: Payer: Medicare Other

## 2019-09-29 ENCOUNTER — Inpatient Hospital Stay (HOSPITAL_BASED_OUTPATIENT_CLINIC_OR_DEPARTMENT_OTHER): Payer: Medicare Other | Admitting: Gynecologic Oncology

## 2019-09-29 ENCOUNTER — Encounter: Payer: Self-pay | Admitting: Hematology and Oncology

## 2019-09-29 ENCOUNTER — Inpatient Hospital Stay: Payer: Medicare Other | Admitting: Gynecologic Oncology

## 2019-09-29 ENCOUNTER — Encounter: Payer: Self-pay | Admitting: Oncology

## 2019-09-29 VITALS — BP 169/41 | HR 70 | Temp 97.9°F | Resp 20 | Ht 65.0 in | Wt 176.0 lb

## 2019-09-29 DIAGNOSIS — Z923 Personal history of irradiation: Secondary | ICD-10-CM | POA: Diagnosis not present

## 2019-09-29 DIAGNOSIS — N184 Chronic kidney disease, stage 4 (severe): Secondary | ICD-10-CM

## 2019-09-29 DIAGNOSIS — C541 Malignant neoplasm of endometrium: Secondary | ICD-10-CM

## 2019-09-29 DIAGNOSIS — Z87891 Personal history of nicotine dependence: Secondary | ICD-10-CM

## 2019-09-29 DIAGNOSIS — R197 Diarrhea, unspecified: Secondary | ICD-10-CM | POA: Diagnosis not present

## 2019-09-29 DIAGNOSIS — Z853 Personal history of malignant neoplasm of breast: Secondary | ICD-10-CM

## 2019-09-29 DIAGNOSIS — C778 Secondary and unspecified malignant neoplasm of lymph nodes of multiple regions: Secondary | ICD-10-CM

## 2019-09-29 NOTE — Assessment & Plan Note (Signed)
She has no evidence of cancer recurrence

## 2019-09-29 NOTE — Progress Notes (Signed)
[place] NOTE  Patient Care Team: [name], NP as PCP - General (Geriatric Medicine) Jettie Booze, MD as PCP - Cardiology (Cardiology) Dustin Folks, MD as Consulting Physician (Hematology and Oncology) Esperanza Heir, MD as Consulting Physician (Obstetrics and Gynecology) Consuella Lose, MD as Consulting Physician (Neurosurgery) Deterding, Jeneen Rinks, MD as Consulting Physician (Nephrology)  ASSESSMENT & PLAN:  Endometrial cancer [facility]) Her case was reviewed at the GYN oncology tumor board We discussed the role of adjuvant treatment She has high risk disease due to lymphovascular invasion and stage IV diagnosis  We reviewed the NCCN guidelines We discussed the role of chemotherapy. The intent is of curative intent.  We discussed some of the risks, benefits, side-effects of carboplatin & Taxol. Treatment is intravenous, every 3 weeks x 6 cycles  Some of the short term side-effects included, though not limited to, including weight loss, life threatening infections, risk of allergic reactions, need for transfusions of blood products, nausea, vomiting, change in bowel habits, loss of hair, admission to hospital for various reasons, and risks of death.   Long term side-effects are also discussed including risks of infertility, permanent damage to nerve function, hearing loss, chronic fatigue, kidney damage with possibility needing hemodialysis, and rare secondary malignancy including bone marrow disorders.  The patient is aware that the response rates discussed earlier is not guaranteed.  After a long discussion, patient made an informed decision to proceed with the prescribed plan of care.   Patient education material was dispensed. We discussed premedication with dexamethasone before chemotherapy. The patient is undecided She is concerned about side effects Alternative options would be single agent carboplatin only or single agent  Taxol She will think about it Given her poor venous access, she would likely need port placement before treatment, along with chemo education class  History of left breast cancer She has no evidence of cancer recurrence   Chronic kidney disease, stage 4 (severe) ([place]) She will need upfront dose adjustment due to chronic kidney disease stage IV   No orders of the defined types were placed in this encounter.   The total time spent in the appointment was 60 minutes encounter with patients including review of chart and various tests results, discussions about plan of care and coordination of care plan   All questions were answered. The patient knows to call the clinic with any problems, questions or concerns. No barriers to learning was detected.  Heath Lark, MD 9/15/20212:10 PM  CHIEF COMPLAINTS/PURPOSE OF CONSULTATION:  Urine cancer, for further management  HISTORY OF PRESENTING ILLNESS:  Regina Porter 85 y.o. female is here because of recent diagnosis of uterine cancer She is here accompanied by her daughter She has remote history of left breast cancer status post lumpectomy in 2013, followed by adjuvant radiation therapy and tamoxifen for 5 years Currently, she lives with her daughter The patient has multiple medical comorbidities Her cancer was recently diagnosed when she presented with postmenopausal bleeding  I have reviewed her chart and materials related to her cancer extensively and collaborated history with the patient. Summary of oncologic history is as follows: Oncology History Overview Note  MMR IHC normal HER2 negative (1+)   History of left breast cancer  Endometrial cancer ([place])  08/23/2019 Initial Biopsy   D&C A. ENDOMETRIUM, CURETTAGE:  -  High-grade carcinoma  -  See comment  COMMENT:  The biopsy consists of a high-grade carcinoma.  Based on the biopsy the differential diagnosis includes serous, high-grade  endometrioid and undifferentiated carcinoma.  These  results were discussed with Dr. Assunta Curtis nurse, Juliann Pulse, on August 24, 2019.    08/27/2019 Initial Diagnosis   Endometrial cancer ([place])   09/01/2019 Imaging   CT C/A/P: 1. Enlarged uterus and heterogeneous endometrial cavity, consistent with the given diagnosis of uterine/cervical cancer. Small to borderline enlarged pelvic retroperitoneal lymph nodes are highly worrisome for metastatic disease. 2. Borderline enlarged thoracic inlet and mediastinal lymph nodes also worrisome for malignancy. 3. Tiny subpleural nodules are likely benign subpleural lymph nodes. Recommend attention on follow-up. 4. Aortic atherosclerosis (ICD10-I70.0). Coronary artery calcification.   09/09/2019 Surgery   Robotic-assisted laparoscopic total hysterectomy with bilateral salpingoophorectomy, SLN biopsy, right pelvic and para-aortic lymphadenectomy  On EUA, bulky enlarged uterus, mobile. Normal upper abdominal survey although prominent gallbladder and some filmy adhesions between the liver and anterior abdominal wall. Normal appearing omentum, small and large bowel. Uterus quite bulky and 10-12cm. Normal appearing adnexa. Mapping successful to right obturator and left external iliac SLNs. Multiple obturator lymph nodes on the right, an external iliac lymph node on the right, and lymph nodes along the common iliac and aorta on the right all enlarged, adherent to surround vessels, and filled with tumor. All grossly involved lymph nodes removed.   09/09/2019 Pathology Results   A. SENTINEL LYMPH NODE, RIGHT OBTURATOR, ADJACENT ENLARGED LYMPH NODE,  EXCISION:  - Metastatic serous carcinoma, see comment   B. LYMPH NODE, RIGHT EXTERNAL ILIAC, BIOPSY:  - Benign lymph node, negative for carcinoma (0/1)   C. SENTINEL LYMPH NODE, LEFT EXTERNAL ILIAC, BIOPSY:  - Benign lymph node, negative for carcinoma (0/1)   D. 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  UTERUS, CERVIX, BILATERAL TUBE AND OVARIES:  - Invasive high-grade serous carcinoma, 8.2 cm  - Carcinoma invades for depth of 2.0 cm with a myometrial thickness is  2.2 cm  - Carcinoma invades into the cervical stroma  - Extensive lymphovascular invasion is present  - Benign unremarkable bilateral fallopian tubes and ovaries  - See oncology table   F. 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 LYMPH NODES, RIGHT COMMON AND PERIAORTIC, RESECTION:  - Metastatic serous carcinoma, see comment      COMMENT:   A.   Lymphoid tissue is not identified.  Findings likely represent an  entirely replaced lymph node(s).   G. Multiple fragments of lymph node(s) were received. The exact number  of lymph nodes cannot be determined but does not affect the nodal stage.     ONCOLOGY TABLE:   UTERUS, CARCINOMA OR CARCINOSARCOMA   Procedure: Total hysterectomy and bilateral salpingo-oophorectomy  Histologic type: Serous carcinoma  Histologic Grade: High-grade  Myometrial invasion:       Depth of invasion: 20 mm       Myometrial thickness: 22 mm  Uterine Serosa Involvement: Not identified  Cervical stromal involvement: Present  Extent of involvement of other organs: Not identified  Lymphovascular invasion: Present, extensive  Regional Lymph Nodes:        Examined:        At least 3 Sentinel                               At least 3 Non-sentinel  At least 6 Total        Lymph nodes with metastasis: 4        Isolated tumor cells (<0.2 mm): 0        Micrometastasis: (>0.2 mm and < 2.0 mm): 0        Macrometastasis: (>2.0 mm): 4        Extracapsular extension: Present  Representative Tumor Block: E8  MMR / MSI testing: Will be ordered  Pathologic Stage Classification (pTNM, AJCC 8th edition):  pT2, pN2a    09/09/2019 Tumor Marker   Patient's tumor was tested for the following markers: CA-125. Results of  the tumor marker test revealed: 181.   09/09/2019 Cancer Staging   Staging form: Corpus Uteri - Carcinoma and Carcinosarcoma, AJCC 8th Edition - Clinical stage from 09/09/2019: FIGO Stage IIIC2, calculated as Stage IVB (cT2, cN2a, pM1) - Signed by Lafonda Mosses, MD on 09/15/2019    She is recovering well from surgery Denies pain Her appetite is stable although she have lost some weight since surgery  MEDICAL HISTORY:  Past Medical History:  Diagnosis Date  . Allergy    mild   . Anemia   . Arthritis    pt states no pain   . Breast cancer ([place])    left   . Chronic kidney disease    ckd stage 4 per lov dr deterding 08-03-2018 on chart  . Dementia ([place])   . Depression   . Gastroesophageal reflux disease   . Heart murmur   . Heartburn   . Hypertension   . Hypothyroidism   . Leg cramps   . Post-menopause bleeding   . Restless leg syndrome 10/07/2014  . Sleep apnea    cpap not in use     SURGICAL HISTORY: Past Surgical History:  Procedure Laterality Date  . back sugery   2019   lower back   . BREAST LUMPECTOMY    . BREAST SURGERY Left 2013   Lumpectomy  radiation done no chemo  . CATARACT EXTRACTION, BILATERAL    . DILATATION & CURETTAGE/HYSTEROSCOPY WITH MYOSURE N/A 08/23/2019   Procedure: [place];  Surgeon: [name], MD;  Location: [place];  Service: Gynecology;  Laterality: N/A;  request to follow 2nd case in [place] block requests one hour  . hysteroscopy biopsy    . ROBOTIC ASSISTED TOTAL HYSTERECTOMY WITH BILATERAL SALPINGO OOPHERECTOMY Bilateral 09/09/2019   Procedure: XI ROBOTIC ASSISTED TOTAL HYSTERECTOMY WITH BILATERAL SALPINGO OOPHORECTOMY;  Surgeon: Lafonda Mosses, MD;  Location: [facility];  Service: Gynecology;  Laterality: Bilateral;  . SENTINEL NODE BIOPSY N/A 09/09/2019   Procedure: SENTINEL NODE BIOPSY AND POSSIBLE LYMPH NODE DISECTION AND POSSIBLE LAPAROTOMY;  Surgeon: Lafonda Mosses, MD;  Location: [facility];  Service: Gynecology;  Laterality: N/A;    SOCIAL HISTORY: Social History   Socioeconomic History  . Marital status: Widowed    Spouse name: Not on file  . Number of children: 5  . Years of education: 23  . Highest education level: Not on file  Occupational History  . Occupation: Retired  Tobacco Use  . Smoking status: Former Smoker    Packs/day: 0.10    Years: 20.00    Pack years: 2.00    Quit date: 2008    Years since quitting: 13.7  . Smokeless tobacco: Never Used  . Tobacco comment: Quit at  age 41  Vaping Use  . Vaping Use: Never used  Substance and Sexual  Activity  . Alcohol use: No    Alcohol/week: 0.0 standard drinks  . Drug use: No  . Sexual activity: Not Currently  Other Topics Concern  . Not on file  Social History Narrative   Diet?       Do you drink/eat things with caffeine? yes      Marital status?                widow                    What year were you married?      Do you live in a house, apartment, assisted living, condo, trailer, etc.? house      Is it one or more stories? yes      How many persons live in your home? 2      Do you have any pets in your home? (please list) no      Current or past profession:      Do you exercise?         no                             Type & how often? none      Do you have a living will? no   Do you have a DNR form?  no                                If not, do you want to discuss one? no      Do you have signed POA/HPOA for forms?    Social Determinants of Health   Financial Resource Strain:   . Difficulty of Paying Living Expenses: Not on file  Food Insecurity:   . Worried About Charity fundraiser in the Last Year: Not on file  . Ran Out of Food in the Last Year: Not on file  Transportation Needs:   . Lack of Transportation (Medical): Not on file  . Lack of Transportation (Non-Medical): Not on file  Physical Activity:   . Days of Exercise per Week: Not on  file  . Minutes of Exercise per Session: Not on file  Stress:   . Feeling of Stress : Not on file  Social Connections:   . Frequency of Communication with Friends and Family: Not on file  . Frequency of Social Gatherings with Friends and Family: Not on file  . Attends Religious Services: Not on file  . Active Member of Clubs or Organizations: Not on file  . Attends Archivist Meetings: Not on file  . Marital Status: Not on file  Intimate Partner Violence:   . Fear of Current or Ex-Partner: Not on file  . Emotionally Abused: Not on file  . Physically Abused: Not on file  . Sexually Abused: Not on file    FAMILY HISTORY: Family History  Problem Relation Age of Onset  . Hypertension Mother   . Arthritis Mother   . Heart disease Mother   . Hypertension Father   . Heart disease Father   . Breast cancer Sister   . Lung cancer Brother   . Breast cancer Paternal Aunt   . Colon cancer Neg Hx   . Colon polyps Neg Hx   . Esophageal cancer Neg Hx   . Rectal cancer Neg Hx   . Stomach cancer Neg Hx  ALLERGIES:  is allergic to shellfish allergy and neomy-bacit-polymyx-pramoxine.  MEDICATIONS:  Current Outpatient Medications  Medication Sig Dispense Refill  . acetaminophen (TYLENOL) 500 MG tablet Take 1,000 mg by mouth every 6 (six) hours as needed for moderate pain.     Marland Kitchen amLODipine (NORVASC) 10 MG tablet TAKE 1 TABLET BY MOUTH EVERY DAY 90 tablet 0  . aspirin 81 MG EC tablet Take 81 mg by mouth daily.     . bumetanide (BUMEX) 2 MG tablet Take 2 mg by mouth daily.  6  . busPIRone (BUSPAR) 10 MG tablet TAKE 1 TABLET BY MOUTH TWICE DAILY .DX F41.9 APPT IS OVERDUE 180 tablet 1  . Carboxymethylcellulose Sodium (EYE DROPS OP) Place 1 drop into both eyes daily as needed (dry eyes).     Marland Kitchen Cod Liver Oil 1000 MG CAPS Take 1,000 mg by mouth 2 (two) times daily.     Marland Kitchen donepezil (ARICEPT) 5 MG tablet TAKE 1 TABLET BY MOUTH EVERYDAY AT BEDTIME DX: G31.84 90 tablet 0  . enoxaparin  (LOVENOX) 30 MG/0.3ML injection 1 syringe Crystal qhs start on POD #2 4.2 mL 0  . hydrALAZINE (APRESOLINE) 100 MG tablet TAKE 1 TABLET (100 MG TOTAL) BY MOUTH 3 (THREE) TIMES DAILY. 270 tablet 1  . levothyroxine (SYNTHROID) 75 MCG tablet TAKE ONE TABLET BY MOUTH ONCE DAILY 30 MINUTES BEFORE BREAKFAST FOR THYROID 90 tablet 0  . losartan (COZAAR) 100 MG tablet TAKE 1 TABLET BY MOUTH EVERYDAY AT BEDTIME 90 tablet 1  . metoprolol succinate (TOPROL-XL) 100 MG 24 hr tablet Take 100 mg by mouth in the morning and at bedtime. Take with or immediately following a meal.    . Multiple Vitamin (MULTIVITAMIN ADULT PO) Take 1 tablet by mouth daily.    Marland Kitchen senna-docusate (SENOKOT-S) 8.6-50 MG tablet Take 2 tablets by mouth at bedtime. For AFTER surgery, do not take if having diarrhea 30 tablet 0  . traZODone (DESYREL) 50 MG tablet TAKE 1 TABLET BY MOUTH EVERYDAY AT BEDTIME 90 tablet 0   No current facility-administered medications for this visit.    REVIEW OF SYSTEMS:   Constitutional: Denies fevers, chills or abnormal night sweats Eyes: Denies blurriness of vision, double vision or watery eyes Ears, nose, mouth, throat, and face: Denies mucositis or sore throat Respiratory: Denies cough, dyspnea or wheezes Cardiovascular: Denies palpitation, chest discomfort or lower extremity swelling Gastrointestinal:  Denies nausea, heartburn or change in bowel habits Skin: Denies abnormal skin rashes Lymphatics: Denies new lymphadenopathy or easy bruising Neurological:Denies numbness, tingling or new weaknesses Behavioral/Psych: Mood is stable, no new changes  All other systems were reviewed with the patient and are negative.  PHYSICAL EXAMINATION: ECOG PERFORMANCE STATUS: 1 - Symptomatic but completely ambulatory  Vitals:   09/29/19 1157 09/29/19 1202  BP: (!) 174/38 (!) 169/44  Pulse: 70   Resp: 18   Temp: (!) 97 F (36.1 C)   SpO2: 100%    Filed Weights   09/29/19 1157  Weight: 176 lb 9.6 oz (80.1 kg)     GENERAL:alert, no distress and comfortable.  Limited examination as the patient is examined sitting on a wheelchair SKIN: skin color, texture, turgor are normal, no rashes or significant lesions EYES: normal, conjunctiva are pink and non-injected, sclera clear OROPHARYNX:no exudate, no erythema and lips, buccal mucosa, and tongue normal  NECK: supple, thyroid normal size, non-tender, without nodularity LYMPH:  no palpable lymphadenopathy in the cervical, axillary or inguinal LUNGS: clear to auscultation and percussion with normal breathing effort HEART:  regular rate & rhythm and no murmurs and no lower extremity edema ABDOMEN:abdomen soft, non-tender and normal bowel sounds.  Well-healed surgical scars Musculoskeletal:no cyanosis of digits and no clubbing  PSYCH: alert & oriented x 3 with fluent speech NEURO: no focal motor/sensory deficits  LABORATORY DATA:  I have reviewed the data as listed Lab Results  Component Value Date   WBC 4.9 09/06/2019   HGB 9.4 (L) 09/06/2019   HCT 29.3 (L) 09/06/2019   MCV 93.0 09/06/2019   PLT 299 09/06/2019   Recent Labs    08/23/19 1029 08/31/19 1442 09/06/19 1346  NA 143 139 140  K 4.3 4.7 4.6  CL 106 107 108  CO2  --  23 22  GLUCOSE 95 95 95  BUN 21 24 25*  CREATININE 2.10* 1.90* 2.05*  CALCIUM  --  9.0 9.1  GFRNONAA  --  24* 22*  GFRAA  --  28* 25*  PROT  --  6.9 7.5  ALBUMIN  --   --  3.5  AST  --  26 21  ALT  --  15 15  ALKPHOS  --   --  39  BILITOT  --  0.3 0.4    RADIOGRAPHIC STUDIES: I have personally reviewed the radiological images as listed and agreed with the findings in the report. CT Abdomen Pelvis Wo Contrast  Result Date: 09/02/2019 CLINICAL DATA:  Uterine/cervical cancer, staging. EXAM: CT CHEST, ABDOMEN AND PELVIS WITHOUT CONTRAST TECHNIQUE: Multidetector CT imaging of the chest, abdomen and pelvis was performed following the standard protocol without IV contrast. COMPARISON:  None. FINDINGS: CT CHEST FINDINGS  Cardiovascular: Atherosclerotic calcification of the aorta, aortic valve and coronary arteries. Heart is at the upper limits of normal in size. Decreased attenuation of the intravascular compartment is indicative of anemia. No pericardial effusion. Mediastinum/Nodes: Low internal jugular and anterior scalene lymph nodes measure up to 10 mm on the left. Mediastinal lymph nodes measure up to 1.3 cm in the right paratracheal station (3/12). Hilar regions are difficult to evaluate without IV contrast. No axillary adenopathy. Esophagus is grossly unremarkable. Lungs/Pleura: A few subpleural nodules measure up to 4 mm in the right lower lobe (4/64). Scarring in the medial right lower lobe. Lungs are otherwise clear. No pleural fluid. Airway is unremarkable. Musculoskeletal: No worrisome lytic or sclerotic lesions. CT ABDOMEN PELVIS FINDINGS Hepatobiliary: Liver and gallbladder are unremarkable. No biliary ductal dilatation. Pancreas: Negative. Spleen: Negative. Adrenals/Urinary Tract: Adrenal glands are unremarkable. Cortical scarring bilaterally. There may be a subcentimeter low-attenuation lesion in the interpolar left kidney, too small to characterize. Kidneys are otherwise unremarkable. Ureters are decompressed. Bladder is grossly unremarkable. Stomach/Bowel: Small hiatal hernia. Stomach is largely decompressed. Stomach, small bowel, appendix and colon are unremarkable. Vascular/Lymphatic: Atherosclerotic calcification of the aorta without aneurysm. Small common iliac lymph nodes are seen bilaterally, measuring up to 10 mm on the right (3/82). Right external iliac lymph nodes measure up to 11 mm (3/97). Reproductive: Uterus is enlarged for age with heterogeneity in the endometrial canal. No adnexal mass. Other: No free fluid. Mesenteries and peritoneum are unremarkable small bilateral inguinal hernias contain fat. Musculoskeletal: Postoperative and degenerative changes in the spine. IMPRESSION: 1. Enlarged uterus and  heterogeneous endometrial cavity, consistent with the given diagnosis of uterine/cervical cancer. Small to borderline enlarged pelvic retroperitoneal lymph nodes are highly worrisome for metastatic disease. 2. Borderline enlarged thoracic inlet and mediastinal lymph nodes also worrisome for malignancy. 3. Tiny subpleural nodules are likely benign subpleural lymph nodes. Recommend attention on  follow-up. 4. Aortic atherosclerosis (ICD10-I70.0). Coronary artery calcification. Electronically Signed   By: Lorin Picket M.D.   On: 09/02/2019 12:00   CT Chest Wo Contrast  Result Date: 09/02/2019 CLINICAL DATA:  Uterine/cervical cancer, staging. EXAM: CT CHEST, ABDOMEN AND PELVIS WITHOUT CONTRAST TECHNIQUE: Multidetector CT imaging of the chest, abdomen and pelvis was performed following the standard protocol without IV contrast. COMPARISON:  None. FINDINGS: CT CHEST FINDINGS Cardiovascular: Atherosclerotic calcification of the aorta, aortic valve and coronary arteries. Heart is at the upper limits of normal in size. Decreased attenuation of the intravascular compartment is indicative of anemia. No pericardial effusion. Mediastinum/Nodes: Low internal jugular and anterior scalene lymph nodes measure up to 10 mm on the left. Mediastinal lymph nodes measure up to 1.3 cm in the right paratracheal station (3/12). Hilar regions are difficult to evaluate without IV contrast. No axillary adenopathy. Esophagus is grossly unremarkable. Lungs/Pleura: A few subpleural nodules measure up to 4 mm in the right lower lobe (4/64). Scarring in the medial right lower lobe. Lungs are otherwise clear. No pleural fluid. Airway is unremarkable. Musculoskeletal: No worrisome lytic or sclerotic lesions. CT ABDOMEN PELVIS FINDINGS Hepatobiliary: Liver and gallbladder are unremarkable. No biliary ductal dilatation. Pancreas: Negative. Spleen: Negative. Adrenals/Urinary Tract: Adrenal glands are unremarkable. Cortical scarring bilaterally.  There may be a subcentimeter low-attenuation lesion in the interpolar left kidney, too small to characterize. Kidneys are otherwise unremarkable. Ureters are decompressed. Bladder is grossly unremarkable. Stomach/Bowel: Small hiatal hernia. Stomach is largely decompressed. Stomach, small bowel, appendix and colon are unremarkable. Vascular/Lymphatic: Atherosclerotic calcification of the aorta without aneurysm. Small common iliac lymph nodes are seen bilaterally, measuring up to 10 mm on the right (3/82). Right external iliac lymph nodes measure up to 11 mm (3/97). Reproductive: Uterus is enlarged for age with heterogeneity in the endometrial canal. No adnexal mass. Other: No free fluid. Mesenteries and peritoneum are unremarkable small bilateral inguinal hernias contain fat. Musculoskeletal: Postoperative and degenerative changes in the spine. IMPRESSION: 1. Enlarged uterus and heterogeneous endometrial cavity, consistent with the given diagnosis of uterine/cervical cancer. Small to borderline enlarged pelvic retroperitoneal lymph nodes are highly worrisome for metastatic disease. 2. Borderline enlarged thoracic inlet and mediastinal lymph nodes also worrisome for malignancy. 3. Tiny subpleural nodules are likely benign subpleural lymph nodes. Recommend attention on follow-up. 4. Aortic atherosclerosis (ICD10-I70.0). Coronary artery calcification. Electronically Signed   By: Lorin Picket M.D.   On: 09/02/2019 12:00

## 2019-09-29 NOTE — Progress Notes (Signed)
Met with Regina Porter and her daughter and encouraged them to call once they make a decision about chemotherapy.  Also gave her the [employer] with my contact information.

## 2019-09-29 NOTE — Patient Instructions (Signed)
You are healing well from surgery! Remember that you should not lift/push anything for 6 weeks after surgery. Every day should continue to get better - if your pain does not continue to improve or you develop new symptoms, please call my office [phone]).

## 2019-09-29 NOTE — Progress Notes (Signed)
Gynecologic Oncology Return Clinic Visit  09/29/19  Reason for Visit: post-op follow-up and treatment counseling  Treatment History: Oncology History Overview Note  MMR IHC normal HER2 negative (1+)   Breast cancer ([place])   Initial Diagnosis   Breast cancer ([place])   Endometrial cancer ([place])  08/23/2019 Initial Biopsy   D&C A. ENDOMETRIUM, CURETTAGE:  -  High-grade carcinoma  -  See comment  COMMENT:  The biopsy consists of a high-grade carcinoma.  Based on the biopsy the differential diagnosis includes serous, high-grade endometrioid and undifferentiated carcinoma.  These results were discussed with Dr. Assunta Curtis nurse, Juliann Pulse, on August 24, 2019.    08/27/2019 Initial Diagnosis   Endometrial cancer ([place])   09/01/2019 Imaging   CT C/A/P: 1. Enlarged uterus and heterogeneous endometrial cavity, consistent with the given diagnosis of uterine/cervical cancer. Small to borderline enlarged pelvic retroperitoneal lymph nodes are highly worrisome for metastatic disease. 2. Borderline enlarged thoracic inlet and mediastinal lymph nodes also worrisome for malignancy. 3. Tiny subpleural nodules are likely benign subpleural lymph nodes. Recommend attention on follow-up. 4. Aortic atherosclerosis (ICD10-I70.0). Coronary artery calcification.   09/09/2019 Surgery   Robotic-assisted laparoscopic total hysterectomy with bilateral salpingoophorectomy, SLN biopsy, right pelvic and para-aortic lymphadenectomy  On EUA, bulky enlarged uterus, mobile. Normal upper abdominal survey although prominent gallbladder and some filmy adhesions between the liver and anterior abdominal wall. Normal appearing omentum, small and large bowel. Uterus quite bulky and 10-12cm. Normal appearing adnexa. Mapping successful to right obturator and left external iliac SLNs. Multiple obturator lymph nodes on the right, an external iliac lymph node on the right, and lymph nodes along the common iliac and aorta on the right all  enlarged, adherent to surround vessels, and filled with tumor. All grossly involved lymph nodes removed.   09/09/2019 Pathology Results   A. SENTINEL LYMPH NODE, RIGHT OBTURATOR, ADJACENT ENLARGED LYMPH NODE,  EXCISION:  - Metastatic serous carcinoma, see comment   B. LYMPH NODE, RIGHT EXTERNAL ILIAC, BIOPSY:  - Benign lymph node, negative for carcinoma (0/1)   C. SENTINEL LYMPH NODE, LEFT EXTERNAL ILIAC, BIOPSY:  - Benign lymph node, negative for carcinoma (0/1)   D. 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 UTERUS, CERVIX, BILATERAL TUBE AND OVARIES:  - Invasive high-grade serous carcinoma, 8.2 cm  - Carcinoma invades for depth of 2.0 cm with a myometrial thickness is  2.2 cm  - Carcinoma invades into the cervical stroma  - Extensive lymphovascular invasion is present  - Benign unremarkable bilateral fallopian tubes and ovaries  - See oncology table   F. 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 LYMPH NODES, RIGHT COMMON AND PERIAORTIC, RESECTION:  - Metastatic serous carcinoma, see comment      COMMENT:   A.   Lymphoid tissue is not identified.  Findings likely represent an  entirely replaced lymph node(s).   G. Multiple fragments of lymph node(s) were received. The exact number  of lymph nodes cannot be determined but does not affect the nodal stage.     ONCOLOGY TABLE:   UTERUS, CARCINOMA OR CARCINOSARCOMA   Procedure: Total hysterectomy and bilateral salpingo-oophorectomy  Histologic type: Serous carcinoma  Histologic Grade: High-grade  Myometrial invasion:       Depth of invasion: 20 mm       Myometrial thickness: 22 mm  Uterine Serosa Involvement: Not identified  Cervical stromal  involvement: Present  Extent of involvement of other organs: Not identified  Lymphovascular invasion: Present, extensive  Regional Lymph Nodes:         Examined:        At least 3 Sentinel                               At least 3 Non-sentinel                               At least 6 Total        Lymph nodes with metastasis: 4        Isolated tumor cells (<0.2 mm): 0        Micrometastasis: (>0.2 mm and < 2.0 mm): 0        Macrometastasis: (>2.0 mm): 4        Extracapsular extension: Present  Representative Tumor Block: E8  MMR / MSI testing: Will be ordered  Pathologic Stage Classification (pTNM, AJCC 8th edition):  pT2, pN2a    09/09/2019 Tumor Marker   Patient's tumor was tested for the following markers: CA-125. Results of the tumor marker test revealed: 181.   09/09/2019 Cancer Staging   Staging form: Corpus Uteri - Carcinoma and Carcinosarcoma, AJCC 8th Edition - Clinical stage from 09/09/2019: FIGO Stage IIIC2, calculated as Stage IVB (cT2, cN2a, pM1) - Signed by Lafonda Mosses, MD on 09/15/2019     Interval History: Patient reports overall continuing to do well since surgery.  Her appetite is still decreased but she is trying to make herself eat.  She feels that she is staying well-hydrated.  She continues to have some lower pelvic and vaginal pain since surgery that is slowly improving.  She denies any urinary symptoms.  She had some looser stools after taking Senokot several times.  Her stools have become more formed over the last week.  She denies any vaginal bleeding or discharge.    Past Medical/Surgical History:  Past Medical History:  Diagnosis Date  . Allergy    mild   . Anemia   . Arthritis    pt states no pain   . Breast cancer ([place])    left   . Chronic kidney disease    ckd stage 4 per lov dr deterding 08-03-2018 on chart  . Dementia ([place])   . Depression   . Gastroesophageal reflux disease   . Heart murmur   . Heartburn   . Hypertension   . Hypothyroidism   . Leg cramps   . Post-menopause bleeding   . Restless leg syndrome 10/07/2014  . Sleep apnea    cpap not in use     Past Surgical History:   Procedure Laterality Date  . back sugery   2019   lower back   . BREAST LUMPECTOMY    . BREAST SURGERY Left 2013   Lumpectomy  radiation done no chemo  . CATARACT EXTRACTION, BILATERAL    . DILATATION & CURETTAGE/HYSTEROSCOPY WITH MYOSURE N/A 08/23/2019   Procedure: [place];  Surgeon: [name], MD;  Location: [facility];  Service: Gynecology;  Laterality: N/A;  request to follow 2nd case in [place] block requests one hour  . hysteroscopy biopsy    . ROBOTIC ASSISTED TOTAL HYSTERECTOMY WITH BILATERAL SALPINGO OOPHERECTOMY Bilateral 09/09/2019   Procedure: XI ROBOTIC ASSISTED TOTAL HYSTERECTOMY WITH BILATERAL SALPINGO  OOPHORECTOMY;  Surgeon: Lafonda Mosses, MD;  Location: [facility];  Service: Gynecology;  Laterality: Bilateral;  . SENTINEL NODE BIOPSY N/A 09/09/2019   Procedure: SENTINEL NODE BIOPSY AND POSSIBLE LYMPH NODE DISECTION AND POSSIBLE LAPAROTOMY;  Surgeon: Lafonda Mosses, MD;  Location: [facility];  Service: Gynecology;  Laterality: N/A;    Family History  Problem Relation Age of Onset  . Hypertension Mother   . Arthritis Mother   . Heart disease Mother   . Hypertension Father   . Heart disease Father   . Breast cancer Sister   . Lung cancer Brother   . Breast cancer Paternal Aunt   . Colon cancer Neg Hx   . Colon polyps Neg Hx   . Esophageal cancer Neg Hx   . Rectal cancer Neg Hx   . Stomach cancer Neg Hx     Social History   Socioeconomic History  . Marital status: Widowed    Spouse name: Not on file  . Number of children: 5  . Years of education: 64  . Highest education level: Not on file  Occupational History  . Occupation: Retired  Tobacco Use  . Smoking status: Former Smoker    Packs/day: 0.10    Years: 20.00    Pack years: 2.00    Quit date: 2008    Years since quitting: 13.7  . Smokeless tobacco: Never Used  . Tobacco comment: Quit at  age 46  Vaping Use  . Vaping Use:  Never used  Substance and Sexual Activity  . Alcohol use: No    Alcohol/week: 0.0 standard drinks  . Drug use: No  . Sexual activity: Not Currently  Other Topics Concern  . Not on file  Social History Narrative   Diet?       Do you drink/eat things with caffeine? yes      Marital status?                widow                    What year were you married?      Do you live in a house, apartment, assisted living, condo, trailer, etc.? house      Is it one or more stories? yes      How many persons live in your home? 2      Do you have any pets in your home? (please list) no      Current or past profession:      Do you exercise?         no                             Type & how often? none      Do you have a living will? no   Do you have a DNR form?  no                                If not, do you want to discuss one? no      Do you have signed POA/HPOA for forms?    Social Determinants of Health   Financial Resource Strain:   . Difficulty of Paying Living Expenses: Not on file  Food Insecurity:   . Worried About Charity fundraiser in the Last Year: Not on file  . Ran Out  of Food in the Last Year: Not on file  Transportation Needs:   . Lack of Transportation (Medical): Not on file  . Lack of Transportation (Non-Medical): Not on file  Physical Activity:   . Days of Exercise per Week: Not on file  . Minutes of Exercise per Session: Not on file  Stress:   . Feeling of Stress : Not on file  Social Connections:   . Frequency of Communication with Friends and Family: Not on file  . Frequency of Social Gatherings with Friends and Family: Not on file  . Attends Religious Services: Not on file  . Active Member of Clubs or Organizations: Not on file  . Attends Archivist Meetings: Not on file  . Marital Status: Not on file    Current Medications:  Current Outpatient Medications:  .  acetaminophen (TYLENOL) 500 MG tablet, Take 1,000 mg by mouth every 6 (six)  hours as needed for moderate pain. , Disp: , Rfl:  .  amLODipine (NORVASC) 10 MG tablet, TAKE 1 TABLET BY MOUTH EVERY DAY, Disp: 90 tablet, Rfl: 0 .  aspirin 81 MG EC tablet, Take 81 mg by mouth daily. , Disp: , Rfl:  .  bumetanide (BUMEX) 2 MG tablet, Take 2 mg by mouth daily., Disp: , Rfl: 6 .  busPIRone (BUSPAR) 10 MG tablet, TAKE 1 TABLET BY MOUTH TWICE DAILY .DX F41.9 APPT IS OVERDUE, Disp: 180 tablet, Rfl: 1 .  Carboxymethylcellulose Sodium (EYE DROPS OP), Place 1 drop into both eyes daily as needed (dry eyes). , Disp: , Rfl:  .  Cod Liver Oil 1000 MG CAPS, Take 1,000 mg by mouth 2 (two) times daily. , Disp: , Rfl:  .  donepezil (ARICEPT) 5 MG tablet, TAKE 1 TABLET BY MOUTH EVERYDAY AT BEDTIME DX: G31.84, Disp: 90 tablet, Rfl: 0 .  enoxaparin (LOVENOX) 30 MG/0.3ML injection, 1 syringe [place] qhs start on POD #2, Disp: 4.2 mL, Rfl: 0 .  hydrALAZINE (APRESOLINE) 100 MG tablet, TAKE 1 TABLET (100 MG TOTAL) BY MOUTH 3 (THREE) TIMES DAILY., Disp: 270 tablet, Rfl: 1 .  levothyroxine (SYNTHROID) 75 MCG tablet, TAKE ONE TABLET BY MOUTH ONCE DAILY 30 MINUTES BEFORE BREAKFAST FOR THYROID, Disp: 90 tablet, Rfl: 0 .  losartan (COZAAR) 100 MG tablet, TAKE 1 TABLET BY MOUTH EVERYDAY AT BEDTIME, Disp: 90 tablet, Rfl: 1 .  metoprolol succinate (TOPROL-XL) 100 MG 24 hr tablet, Take 100 mg by mouth in the morning and at bedtime. Take with or immediately following a meal., Disp: , Rfl:  .  Multiple Vitamin (MULTIVITAMIN ADULT PO), Take 1 tablet by mouth daily., Disp: , Rfl:  .  senna-docusate (SENOKOT-S) 8.6-50 MG tablet, Take 2 tablets by mouth at bedtime. For AFTER surgery, do not take if having diarrhea, Disp: 30 tablet, Rfl: 0 .  traZODone (DESYREL) 50 MG tablet, TAKE 1 TABLET BY MOUTH EVERYDAY AT BEDTIME, Disp: 90 tablet, Rfl: 0  Review of Systems: + appetite changes Denies fevers, chills, fatigue, unexplained weight changes. Denies hearing loss, neck lumps or masses, mouth sores, ringing in ears or voice  changes. Denies cough or wheezing.  Denies shortness of breath. Denies chest pain or palpitations. Denies leg swelling. Denies abdominal distention, pain, blood in stools, constipation, diarrhea, nausea, vomiting, or early satiety. Denies pain with intercourse, dysuria, frequency, hematuria or incontinence. Denies hot flashes, pelvic pain, vaginal bleeding or vaginal discharge.   Denies joint pain, back pain or muscle pain/cramps. Denies itching, rash, or wounds. Denies dizziness, headaches, numbness or  seizures. Denies swollen lymph nodes or glands, denies easy bruising or bleeding. Denies anxiety, depression, confusion, or decreased concentration.  Physical Exam: BP (!) 169/41 (BP Location: Right Arm, Patient Position: Sitting) Comment: RN notified  Pulse 70   Temp 97.9 F (36.6 C) (Tympanic)   Resp 20   Ht _0  (1.651 m)   Wt 176 lb (79.8 kg)   SpO2 100% Comment: RA  BMI 29.29 kg/m  General: Alert, oriented, no acute distress. HEENT: Normocephalic, atraumatic, sclera anicteric. Chest: Unlabored breathing on room air. Abdomen: soft, mildly tender.  Normoactive bowel sounds.  No masses or hepatosplenomegaly appreciated.  Well-healing incisions - honeycomb dressing and remaining dermabond removed.  GU: Normal appearing external genitalia without erythema, excoriation, or lesions.  Speculum exam reveals cuff intact, no bleeding or discharge, no masses.  Sutures sill visible. Bimanual exam reveals coff intact, no fluctuance.    Laboratory & Radiologic Studies: CBC    Component Value Date/Time   WBC 4.9 09/06/2019 1346   RBC 3.15 (L) 09/06/2019 1346   HGB 9.4 (L) 09/06/2019 1346   HCT 29.3 (L) 09/06/2019 1346   PLT 299 09/06/2019 1346   MCV 93.0 09/06/2019 1346   MCH 29.8 09/06/2019 1346   MCHC 32.1 09/06/2019 1346   RDW 15.0 09/06/2019 1346   LYMPHSABS 1,355 08/31/2019 1442   MONOABS 0.5 09/24/2018 1031   EOSABS 90 08/31/2019 1442   BASOSABS 20 08/31/2019 1442   CMP  Latest Ref Rng & Units 09/06/2019 08/31/2019 08/23/2019  Glucose 70 - 99 mg/dL 95 95 95  BUN 8 - 23 mg/dL 25(H) 24 21  Creatinine 0.44 - 1.00 mg/dL 2.05(H) 1.90(H) 2.10(H)  Sodium 135 - 145 mmol/L 140 139 143  Potassium 3.5 - 5.1 mmol/L 4.6 4.7 4.3  Chloride 98 - 111 mmol/L 108 107 106  CO2 22 - 32 mmol/L 22 23 -  Calcium 8.9 - 10.3 mg/dL 9.1 9.0 -  Total Protein 6.5 - 8.1 g/dL 7.5 6.9 -  Total Bilirubin 0.3 - 1.2 mg/dL 0.4 0.3 -  Alkaline Phos 38 - 126 U/L 39 - -  AST 15 - 41 U/L 21 26 -  ALT 0 - 44 U/L 15 15 -   Assessment & Plan: Regina Porter is a 85 y.o. 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  Patient overall seems to be healing well from surgery.  We discussed continued expectations and restrictions int he postoperative period. She is still struggling with her appetite - we discussed nutritional supplements.  The patient met with Dr. Alvy Bimler today.  She has hesitations about moving forward with adjuvant chemotherapy.  She is very nervous that she is not healthy and strong enough for treatment.  We discussed close monitoring both of side effects as well as labs in terms of deciding how best to proceed during treatment.  Patient would like some time to think about the option of chemotherapy and will call and let us know when she has made this decision.  18 minutes of total time was spent for this patient encounter, including preparation, face-to-face counseling with the patient and coordination of care, and documentation of the encounter.  Jeral Pinch, MD  Division of Gynecologic Oncology  Department of Obstetrics and Gynecology  [facility]

## 2019-09-29 NOTE — Assessment & Plan Note (Signed)
She will need upfront dose adjustment due to chronic kidney disease stage IV

## 2019-09-29 NOTE — Assessment & Plan Note (Signed)
Her case was reviewed at the GYN oncology tumor board We discussed the role of adjuvant treatment She has high risk disease due to lymphovascular invasion and stage IV diagnosis  We reviewed the NCCN guidelines We discussed the role of chemotherapy. The intent is of curative intent.  We discussed some of the risks, benefits, side-effects of carboplatin & Taxol. Treatment is intravenous, every 3 weeks x 6 cycles  Some of the short term side-effects included, though not limited to, including weight loss, life threatening infections, risk of allergic reactions, need for transfusions of blood products, nausea, vomiting, change in bowel habits, loss of hair, admission to hospital for various reasons, and risks of death.   Long term side-effects are also discussed including risks of infertility, permanent damage to nerve function, hearing loss, chronic fatigue, kidney damage with possibility needing hemodialysis, and rare secondary malignancy including bone marrow disorders.  The patient is aware that the response rates discussed earlier is not guaranteed.  After a long discussion, patient made an informed decision to proceed with the prescribed plan of care.   Patient education material was dispensed. We discussed premedication with dexamethasone before chemotherapy. The patient is undecided She is concerned about side effects Alternative options would be single agent carboplatin only or single agent Taxol She will think about it Given her poor venous access, she would likely need port placement before treatment, along with chemo education class

## 2019-09-30 ENCOUNTER — Encounter (HOSPITAL_COMMUNITY): Payer: Self-pay | Admitting: Gynecologic Oncology

## 2019-09-30 LAB — COMPLETE METABOLIC PANEL WITH GFR
AG Ratio: 1.1 (calc) (ref 1.0–2.5)
ALT: 13 U/L (ref 6–29)
AST: 20 U/L (ref 10–35)
Albumin: 3.6 g/dL (ref 3.6–5.1)
Alkaline phosphatase (APISO): 40 U/L (ref 37–153)
BUN/Creatinine Ratio: 12 (calc) (ref 6–22)
BUN: 21 mg/dL (ref 7–25)
CO2: 19 mmol/L — ABNORMAL LOW (ref 20–32)
Calcium: 8.7 mg/dL (ref 8.6–10.4)
Chloride: 109 mmol/L (ref 98–110)
Creat: 1.78 mg/dL — ABNORMAL HIGH (ref 0.60–0.88)
GFR, Est African American: 30 mL/min/{1.73_m2} — ABNORMAL LOW (ref >=60)
GFR, Est Non African American: 26 mL/min/{1.73_m2} — ABNORMAL LOW (ref >=60)
Globulin: 3.2 g/dL (calc) (ref 1.9–3.7)
Glucose, Bld: 91 mg/dL (ref 65–99)
Potassium: 4.7 mmol/L (ref 3.5–5.3)
Sodium: 138 mmol/L (ref 135–146)
Total Bilirubin: 0.3 mg/dL (ref 0.2–1.2)
Total Protein: 6.8 g/dL (ref 6.1–8.1)

## 2019-09-30 LAB — CBC WITH DIFFERENTIAL/PLATELET
Absolute Monocytes: 433 cells/uL (ref 200–950)
Basophils Absolute: 19 cells/uL (ref 0–200)
Basophils Relative: 0.5 %
Eosinophils Absolute: 122 cells/uL (ref 15–500)
Eosinophils Relative: 3.2 %
HCT: 27.6 % — ABNORMAL LOW (ref 35.0–45.0)
Hemoglobin: 9 g/dL — ABNORMAL LOW (ref 11.7–15.5)
Lymphs Abs: 1262 cells/uL (ref 850–3900)
MCH: 29.8 pg (ref 27.0–33.0)
MCHC: 32.6 g/dL (ref 32.0–36.0)
MCV: 91.4 fL (ref 80.0–100.0)
MPV: 10 fL (ref 7.5–12.5)
Monocytes Relative: 11.4 %
Neutro Abs: 1965 cells/uL (ref 1500–7800)
Neutrophils Relative %: 51.7 %
Platelets: 387 10*3/uL (ref 140–400)
RBC: 3.02 10*6/uL — ABNORMAL LOW (ref 3.80–5.10)
RDW: 15 % (ref 11.0–15.0)
Total Lymphocyte: 33.2 %
WBC: 3.8 10*3/uL (ref 3.8–10.8)

## 2019-09-30 LAB — AMYLASE: Amylase: 99 U/L (ref 21–101)

## 2019-09-30 LAB — LIPASE: Lipase: 49 U/L (ref 7–60)

## 2019-10-01 ENCOUNTER — Telehealth: Payer: Self-pay | Admitting: Oncology

## 2019-10-01 NOTE — Telephone Encounter (Signed)
Called Elder Love (daughter) and she said Carriann is doing really well.  She is still discussing treatment with her family.  They will call once she has made a decision.

## 2019-10-04 ENCOUNTER — Telehealth: Payer: Self-pay | Admitting: Oncology

## 2019-10-04 ENCOUNTER — Other Ambulatory Visit: Payer: Self-pay | Admitting: *Deleted

## 2019-10-04 ENCOUNTER — Other Ambulatory Visit: Payer: Self-pay | Admitting: Hematology and Oncology

## 2019-10-04 DIAGNOSIS — N184 Chronic kidney disease, stage 4 (severe): Secondary | ICD-10-CM | POA: Diagnosis not present

## 2019-10-04 DIAGNOSIS — C541 Malignant neoplasm of endometrium: Secondary | ICD-10-CM | POA: Diagnosis not present

## 2019-10-04 DIAGNOSIS — Z87891 Personal history of nicotine dependence: Secondary | ICD-10-CM | POA: Diagnosis not present

## 2019-10-04 DIAGNOSIS — E039 Hypothyroidism, unspecified: Secondary | ICD-10-CM | POA: Diagnosis not present

## 2019-10-04 DIAGNOSIS — Z9012 Acquired absence of left breast and nipple: Secondary | ICD-10-CM | POA: Diagnosis not present

## 2019-10-04 DIAGNOSIS — Z48816 Encounter for surgical aftercare following surgery on the genitourinary system: Secondary | ICD-10-CM | POA: Diagnosis not present

## 2019-10-04 DIAGNOSIS — I129 Hypertensive chronic kidney disease with stage 1 through stage 4 chronic kidney disease, or unspecified chronic kidney disease: Secondary | ICD-10-CM | POA: Diagnosis not present

## 2019-10-04 DIAGNOSIS — G2581 Restless legs syndrome: Secondary | ICD-10-CM | POA: Diagnosis not present

## 2019-10-04 DIAGNOSIS — F0280 Dementia in other diseases classified elsewhere without behavioral disturbance: Secondary | ICD-10-CM

## 2019-10-04 DIAGNOSIS — F329 Major depressive disorder, single episode, unspecified: Secondary | ICD-10-CM

## 2019-10-04 DIAGNOSIS — Z853 Personal history of malignant neoplasm of breast: Secondary | ICD-10-CM | POA: Diagnosis not present

## 2019-10-04 DIAGNOSIS — Z90710 Acquired absence of both cervix and uterus: Secondary | ICD-10-CM | POA: Diagnosis not present

## 2019-10-04 DIAGNOSIS — M1990 Unspecified osteoarthritis, unspecified site: Secondary | ICD-10-CM | POA: Diagnosis not present

## 2019-10-04 DIAGNOSIS — G4700 Insomnia, unspecified: Secondary | ICD-10-CM | POA: Diagnosis not present

## 2019-10-04 DIAGNOSIS — K219 Gastro-esophageal reflux disease without esophagitis: Secondary | ICD-10-CM | POA: Diagnosis not present

## 2019-10-04 DIAGNOSIS — D630 Anemia in neoplastic disease: Secondary | ICD-10-CM | POA: Diagnosis not present

## 2019-10-04 MED ORDER — METOPROLOL SUCCINATE ER 100 MG PO TB24: 100.0000 mg | ORAL_TABLET | Freq: Two times a day (BID) | ORAL | 0 refills | Status: DC

## 2019-10-04 MED ORDER — LEVOTHYROXINE SODIUM 75 MCG PO TABS: ORAL_TABLET | ORAL | 0 refills | Status: DC

## 2019-10-04 MED ORDER — TRAZODONE HCL 50 MG PO TABS: ORAL_TABLET | ORAL | 0 refills | Status: DC

## 2019-10-04 NOTE — Telephone Encounter (Signed)
Regina Porter (daughter) called and said Regina Porter has decided on the standard treatment with chemotherapy (carboplatin/Taxol).  Advised her that I will notify Dr. Alvy Bimler and Dr. Berline Lopes and that we will call with appointments.

## 2019-10-04 NOTE — Telephone Encounter (Signed)
I am putting order for port I recommend seeing me 9/28, chemo class and labs and first dose chemo on 9/29 If her daughter agrees, I will place orders for scheduling

## 2019-10-04 NOTE — Progress Notes (Signed)
START ON PATHWAY REGIMEN - Uterine     A cycle is every 21 days:     Paclitaxel      Carboplatin   **Always confirm dose/schedule in your pharmacy ordering system**  Patient Characteristics: Serous Carcinoma, Newly Diagnosed, Postoperative (Pathologic Staging), Postoperative Histology: Serous Carcinoma Therapeutic Status: Newly Diagnosed, Postoperative (Pathologic Staging) AJCC M Category: pM1 AJCC 8 Stage Grouping: IVB AJCC T Category: pT3 AJCC N Category: pN1 Intent of Therapy: Curative Intent, Discussed with Patient

## 2019-10-04 NOTE — Telephone Encounter (Signed)
Notified [name] of port placement on 10/11/19 with arrival at [facility] admitting at 7:30 (NPO after midnight and needs a driver).    Also called [facility] and rescheduled mammogram and bone density scan for 6 months - 03/20/20 - per Dr. Alvy Bimler.  Left a message for [name] with new date and times of appointments.

## 2019-10-04 NOTE — Telephone Encounter (Signed)
I sent scheduling msg 

## 2019-10-04 NOTE — Telephone Encounter (Signed)
Patient Caregiver requested refills.  Pended Rx and sent to [facility] for approval due to [place].

## 2019-10-04 NOTE — Telephone Encounter (Signed)
Called [name] back and she is in agreement for port and appointments on 9/28 and chemo to start on 9/29.

## 2019-10-07 ENCOUNTER — Telehealth: Payer: Self-pay | Admitting: Oncology

## 2019-10-07 ENCOUNTER — Other Ambulatory Visit: Payer: Self-pay | Admitting: Radiology

## 2019-10-07 NOTE — Telephone Encounter (Signed)
Called [place] and went over all upcoming appointments.  She verbalized understanding and agreement.

## 2019-10-11 ENCOUNTER — Ambulatory Visit: Payer: Medicare Other | Admitting: Nurse Practitioner

## 2019-10-11 ENCOUNTER — Encounter (HOSPITAL_COMMUNITY): Payer: Self-pay

## 2019-10-11 ENCOUNTER — Other Ambulatory Visit: Payer: Self-pay

## 2019-10-11 ENCOUNTER — Other Ambulatory Visit: Payer: Medicare Other

## 2019-10-11 ENCOUNTER — Ambulatory Visit (HOSPITAL_COMMUNITY)
Admission: RE | Admit: 2019-10-11 | Discharge: 2019-10-11 | Disposition: A | Discharge status: Home / Self Care | Payer: Medicare Other | Source: Ambulatory Visit | Attending: Hematology and Oncology | Admitting: Hematology and Oncology

## 2019-10-11 ENCOUNTER — Ambulatory Visit: Payer: Medicare Other

## 2019-10-11 DIAGNOSIS — F0390 Unspecified dementia without behavioral disturbance: Secondary | ICD-10-CM | POA: Diagnosis not present

## 2019-10-11 DIAGNOSIS — Z452 Encounter for adjustment and management of vascular access device: Secondary | ICD-10-CM | POA: Diagnosis not present

## 2019-10-11 DIAGNOSIS — N184 Chronic kidney disease, stage 4 (severe): Secondary | ICD-10-CM | POA: Diagnosis not present

## 2019-10-11 DIAGNOSIS — Z853 Personal history of malignant neoplasm of breast: Secondary | ICD-10-CM | POA: Insufficient documentation

## 2019-10-11 DIAGNOSIS — I129 Hypertensive chronic kidney disease with stage 1 through stage 4 chronic kidney disease, or unspecified chronic kidney disease: Secondary | ICD-10-CM | POA: Diagnosis not present

## 2019-10-11 DIAGNOSIS — F329 Major depressive disorder, single episode, unspecified: Secondary | ICD-10-CM | POA: Diagnosis not present

## 2019-10-11 DIAGNOSIS — G4730 Sleep apnea, unspecified: Secondary | ICD-10-CM | POA: Diagnosis not present

## 2019-10-11 DIAGNOSIS — Z87891 Personal history of nicotine dependence: Secondary | ICD-10-CM | POA: Insufficient documentation

## 2019-10-11 DIAGNOSIS — C541 Malignant neoplasm of endometrium: Secondary | ICD-10-CM | POA: Insufficient documentation

## 2019-10-11 DIAGNOSIS — G2581 Restless legs syndrome: Secondary | ICD-10-CM | POA: Insufficient documentation

## 2019-10-11 DIAGNOSIS — C55 Malignant neoplasm of uterus, part unspecified: Secondary | ICD-10-CM | POA: Diagnosis not present

## 2019-10-11 DIAGNOSIS — Z7982 Long term (current) use of aspirin: Secondary | ICD-10-CM | POA: Diagnosis not present

## 2019-10-11 DIAGNOSIS — Z79890 Hormone replacement therapy (postmenopausal): Secondary | ICD-10-CM | POA: Insufficient documentation

## 2019-10-11 DIAGNOSIS — K219 Gastro-esophageal reflux disease without esophagitis: Secondary | ICD-10-CM | POA: Diagnosis not present

## 2019-10-11 DIAGNOSIS — Z79899 Other long term (current) drug therapy: Secondary | ICD-10-CM | POA: Diagnosis not present

## 2019-10-11 DIAGNOSIS — E039 Hypothyroidism, unspecified: Secondary | ICD-10-CM | POA: Diagnosis not present

## 2019-10-11 HISTORY — PX: IR IMAGING GUIDED PORT INSERTION: IMG5740

## 2019-10-11 MED ORDER — HEPARIN SOD (PORK) LOCK FLUSH 100 UNIT/ML IV SOLN
INTRAVENOUS | Status: AC | PRN
  Administered 2019-10-11: 500 [IU]

## 2019-10-11 MED ORDER — SODIUM CHLORIDE 0.9 % IV SOLN: INTRAVENOUS | Status: DC

## 2019-10-11 MED ORDER — CEFAZOLIN SODIUM-DEXTROSE 2-4 GM/100ML-% IV SOLN: 2.0000 g | INTRAVENOUS | Status: AC

## 2019-10-11 MED ORDER — CEFAZOLIN SODIUM-DEXTROSE 2-4 GM/100ML-% IV SOLN
INTRAVENOUS | Status: AC
  Administered 2019-10-11: 2 g via INTRAVENOUS
  Filled 2019-10-11: qty 100

## 2019-10-11 MED ORDER — MIDAZOLAM HCL 2 MG/2ML IJ SOLN
INTRAMUSCULAR | Status: AC | PRN
  Administered 2019-10-11: 1 mg via INTRAVENOUS

## 2019-10-11 MED ORDER — LIDOCAINE-EPINEPHRINE 1 %-1:100000 IJ SOLN
INTRAMUSCULAR | Status: AC
  Filled 2019-10-11: qty 1

## 2019-10-11 MED ORDER — MIDAZOLAM HCL 2 MG/2ML IJ SOLN
INTRAMUSCULAR | Status: AC
  Filled 2019-10-11: qty 2

## 2019-10-11 MED ORDER — LIDOCAINE-EPINEPHRINE 1 %-1:100000 IJ SOLN
INTRAMUSCULAR | Status: AC | PRN
  Administered 2019-10-11: 20 mL via INTRADERMAL

## 2019-10-11 MED ORDER — FENTANYL CITRATE (PF) 100 MCG/2ML IJ SOLN
INTRAMUSCULAR | Status: AC | PRN
  Administered 2019-10-11 (×2): 25 ug via INTRAVENOUS

## 2019-10-11 MED ORDER — HEPARIN SOD (PORK) LOCK FLUSH 100 UNIT/ML IV SOLN
INTRAVENOUS | Status: AC
  Filled 2019-10-11: qty 5

## 2019-10-11 MED ORDER — FENTANYL CITRATE (PF) 100 MCG/2ML IJ SOLN
INTRAMUSCULAR | Status: AC
  Filled 2019-10-11: qty 2

## 2019-10-11 NOTE — H&P (Addendum)
Chief Complaint: Patient was seen in consultation today for [facility] a Cath placement at the request of Alicia  Referring Physician(s): Heath Lark  Supervising Physician: Dr Ruthann Cancer  Patient Status: [facility] - Out-pt  History of Present Illness: Regina Porter is a 85 y.o. female   Hx Left Breast Ca 2013 New Dx Uterine Ca-- follows with Dr Alvy Bimler To start chemo therapy next week Given her poor venous access, she would likely need port placement before treatment, along with chemo education class  Scheduled for [facility] placement today in IR    Past Medical History:  Diagnosis Date  . Allergy    mild   . Anemia   . Arthritis    pt states no pain   . Breast cancer ([place])    left   . Chronic kidney disease    ckd stage 4 per lov dr deterding 08-03-2018 on chart  . Dementia ([place])   . Depression   . Gastroesophageal reflux disease   . Heart murmur   . Heartburn   . Hypertension   . Hypothyroidism   . Leg cramps   . Post-menopause bleeding   . Restless leg syndrome 10/07/2014  . Sleep apnea    cpap not in use     Past Surgical History:  Procedure Laterality Date  . back sugery   2019   lower back   . BREAST LUMPECTOMY    . BREAST SURGERY Left 2013   Lumpectomy  radiation done no chemo  . CATARACT EXTRACTION, BILATERAL    . DILATATION & CURETTAGE/HYSTEROSCOPY WITH MYOSURE N/A 08/23/2019   Procedure: [place];  Surgeon: [name], MD;  Location: [facility];  Service: Gynecology;  Laterality: N/A;  request to follow 2nd case in [place] block requests one hour  . hysteroscopy biopsy    . ROBOTIC ASSISTED TOTAL HYSTERECTOMY WITH BILATERAL SALPINGO OOPHERECTOMY Bilateral 09/09/2019   Procedure: XI ROBOTIC ASSISTED TOTAL HYSTERECTOMY WITH BILATERAL SALPINGO OOPHORECTOMY;  Surgeon: Lafonda Mosses, MD;  Location: [facility];  Service: Gynecology;  Laterality: Bilateral;  . SENTINEL NODE BIOPSY N/A  09/09/2019   Procedure: SENTINEL NODE BIOPSY AND POSSIBLE LYMPH NODE DISECTION AND POSSIBLE LAPAROTOMY;  Surgeon: Lafonda Mosses, MD;  Location: [facility];  Service: Gynecology;  Laterality: N/A;    Allergies: Shellfish allergy and Neomy-bacit-polymyx-pramoxine  Medications: Prior to Admission medications   Medication Sig Start Date End Date Taking? Authorizing Provider  acetaminophen (TYLENOL) 500 MG tablet Take 1,000 mg by mouth every 6 (six) hours as needed for moderate pain.    Yes [provider]  amLODipine (NORVASC) 10 MG tablet TAKE 1 TABLET BY MOUTH EVERY DAY 08/19/19  Yes [name], NP  aspirin 81 MG EC tablet Take 81 mg by mouth daily.    Yes [provider]  bumetanide (BUMEX) 2 MG tablet Take 2 mg by mouth daily. 03/08/17  Yes [provider]  busPIRone (BUSPAR) 10 MG tablet TAKE 1 TABLET BY MOUTH TWICE DAILY .DX F41.9 APPT IS OVERDUE 07/30/19  Yes [name], NP  Carboxymethylcellulose Sodium (EYE DROPS OP) Place 1 drop into both eyes daily as needed (dry eyes).    Yes [provider]  Cod Liver Oil 1000 MG CAPS Take 1,000 mg by mouth 2 (two) times daily.    Yes [provider]  donepezil (ARICEPT) 5 MG tablet TAKE 1 TABLET BY MOUTH EVERYDAY AT BEDTIME DX: G31.84 08/02/19  Yes [name], NP  enoxaparin (LOVENOX) 30 MG/0.3ML injection 1 syringe [place] qhs start on POD #2 09/09/19  Yes Lahoma Crocker, MD  hydrALAZINE (APRESOLINE) 100 MG tablet TAKE 1 TABLET (100 MG TOTAL) BY MOUTH 3 (THREE) TIMES DAILY. 07/30/19  Yes Ngetich, Dinah C, NP  levothyroxine (SYNTHROID) 75 MCG tablet Take one tablet by mouth once daily 30 minutes before breakfast for thyroid 10/04/19  Yes [name], NP  losartan (COZAAR) 100 MG tablet TAKE 1 TABLET BY MOUTH EVERYDAY AT BEDTIME 07/30/19  Yes [name], NP  metoprolol succinate (TOPROL-XL) 100 MG 24 hr tablet Take 1 tablet (100 mg total) by mouth in the morning and at bedtime.  Take with or immediately following a meal. 10/04/19  Yes [name], NP  Multiple Vitamin (MULTIVITAMIN ADULT PO) Take 1 tablet by mouth daily.   Yes [provider]  senna-docusate (SENOKOT-S) 8.6-50 MG tablet Take 2 tablets by mouth at bedtime. For AFTER surgery, do not take if having diarrhea 08/27/19  Yes Cross, Lenna Sciara D, NP  traZODone (DESYREL) 50 MG tablet Take one tablet by mouth once daily at bedtime 10/04/19  Yes [name], NP     Family History  Problem Relation Age of Onset  . Hypertension Mother   . Arthritis Mother   . Heart disease Mother   . Hypertension Father   . Heart disease Father   . Breast cancer Sister   . Lung cancer Brother   . Breast cancer Paternal Aunt   . Colon cancer Neg Hx   . Colon polyps Neg Hx   . Esophageal cancer Neg Hx   . Rectal cancer Neg Hx   . Stomach cancer Neg Hx     Social History   Socioeconomic History  . Marital status: Widowed    Spouse name: Not on file  . Number of children: 5  . Years of education: 34  . Highest education level: Not on file  Occupational History  . Occupation: Retired  Tobacco Use  . Smoking status: Former Smoker    Packs/day: 0.10    Years: 20.00    Pack years: 2.00    Quit date: 2008    Years since quitting: 13.7  . Smokeless tobacco: Never Used  . Tobacco comment: Quit at  age 10  Vaping Use  . Vaping Use: Never used  Substance and Sexual Activity  . Alcohol use: No    Alcohol/week: 0.0 standard drinks  . Drug use: No  . Sexual activity: Not Currently  Other Topics Concern  . Not on file  Social History Narrative   Diet?       Do you drink/eat things with caffeine? yes      Marital status?                widow                    What year were you married?      Do you live in a house, apartment, assisted living, condo, trailer, etc.? house      Is it one or more stories? yes      How many persons live in your home? 2      Do you have any pets in your home?  (please list) no      Current or past profession:      Do you exercise?         no  Type & how often? none      Do you have a living will? no   Do you have a DNR form?  no                                If not, do you want to discuss one? no      Do you have signed POA/HPOA for forms?    Social Determinants of Health   Financial Resource Strain:   . Difficulty of Paying Living Expenses: Not on file  Food Insecurity:   . Worried About Charity fundraiser in the Last Year: Not on file  . Ran Out of Food in the Last Year: Not on file  Transportation Needs:   . Lack of Transportation (Medical): Not on file  . Lack of Transportation (Non-Medical): Not on file  Physical Activity:   . Days of Exercise per Week: Not on file  . Minutes of Exercise per Session: Not on file  Stress:   . Feeling of Stress : Not on file  Social Connections:   . Frequency of Communication with Friends and Family: Not on file  . Frequency of Social Gatherings with Friends and Family: Not on file  . Attends Religious Services: Not on file  . Active Member of Clubs or Organizations: Not on file  . Attends Archivist Meetings: Not on file  . Marital Status: Not on file     Review of Systems: A 12 point ROS discussed and pertinent positives are indicated in the HPI above.  All other systems are negative.  Review of Systems  Constitutional: Negative for activity change, fatigue and fever.  Respiratory: Negative for cough and shortness of breath.   Cardiovascular: Negative for chest pain.  Gastrointestinal: Negative for abdominal pain.  Psychiatric/Behavioral: Negative for behavioral problems and confusion.    Vital Signs: BP (!) 194/44   Pulse 67   Temp 98 F (36.7 C) (Oral)   Ht 5\' 5"  (1.651 m)   Wt 185 lb (83.9 kg)   SpO2 100%   BMI 30.79 kg/m   Physical Exam Vitals reviewed.  Cardiovascular:     Rate and Rhythm: Normal rate and regular rhythm.     Heart  sounds: Normal heart sounds.  Pulmonary:     Effort: Pulmonary effort is normal.     Breath sounds: Normal breath sounds.  Abdominal:     Palpations: Abdomen is soft.  Musculoskeletal:        General: Normal range of motion.  Skin:    General: Skin is warm.  Neurological:     Mental Status: She is alert and oriented to person, place, and time.  Psychiatric:        Behavior: Behavior normal.     Imaging: DG Abd 1 View  Result Date: 09/30/2019 CLINICAL DATA:  Diarrhea EXAM: ABDOMEN - 1 VIEW COMPARISON:  CT abdomen and pelvis September 01, 2019 FINDINGS: There is moderate stool in the colon. There is no bowel dilatation or air-fluid level to suggest bowel obstruction. No free air. Postoperative change noted in the mid lumbar region. Calcification in the upper right pelvis is likely secondary to phlebolith. IMPRESSION: No bowel obstruction or free air.  Moderate stool in colon. Electronically Signed   By: Lowella Grip III M.D.   On: 09/30/2019 15:41    Labs:  CBC: Recent Labs    08/23/19 1025 08/23/19 1025 08/23/19 1029  08/31/19 1442 09/06/19 1346 09/29/19 1100  WBC 6.2  --   --  5.0 4.9 3.8  HGB 10.5*   < > 12.6 9.7* 9.4* 9.0*  HCT 32.8*   < > 37.0 29.3* 29.3* 27.6*  PLT 439*  --   --  282 299 387   < > = values in this interval not displayed.    COAGS: No results for input(s): INR, APTT in the last 8760 hours.  BMP: Recent Labs    08/23/19 1029 08/31/19 1442 09/06/19 1346 09/29/19 1100  NA 143 139 140 138  K 4.3 4.7 4.6 4.7  CL 106 107 108 109  CO2  --  23 22 19*  GLUCOSE 95 95 95 91  BUN 21 24 25* 21  CALCIUM  --  9.0 9.1 8.7  CREATININE 2.10* 1.90* 2.05* 1.78*  GFRNONAA  --  24* 22* 26*  GFRAA  --  28* 25* 30*    LIVER FUNCTION TESTS: Recent Labs    08/31/19 1442 09/06/19 1346 09/29/19 1100  BILITOT 0.3 0.4 0.3  AST 26 21 20   ALT 15 15 13   ALKPHOS  --  39  --   PROT 6.9 7.5 6.8  ALBUMIN  --  3.5  --     TUMOR MARKERS: No results for  input(s): AFPTM, CEA, CA199, CHROMGRNA in the last 8760 hours.  Assessment and Plan:  Newly diagnosed Uterine Cancer To start chemo next week Scheduled for port a cath placement today Risks and benefits of image guided port-a-catheter placement was discussed with the patient including, but not limited to bleeding, infection, pneumothorax, or fibrin sheath development and need for additional procedures.  All of the patient's questions were answered, patient is agreeable to proceed. Consent signed and in chart.   Thank you for this interesting consult.  I greatly enjoyed meeting Regina Porter and look forward to participating in their care.  A copy of this report was sent to the requesting provider on this date.  Electronically Signed: Lavonia Drafts, PA-C 10/11/2019, 8:51 AM   I spent a total of  30 Minutes   in face to face in clinical consultation, greater than 50% of which was counseling/coordinating care for port a cath placement

## 2019-10-11 NOTE — Discharge Instructions (Signed)
Implanted Port Insertion, Care After This sheet gives you information about how to care for yourself after your procedure. Your health care provider may also give you more specific instructions. If you have problems or questions, contact your health care provider. What can I expect after the procedure? After the procedure, it is common to have:  Discomfort at the port insertion site.  Bruising on the skin over the port. This should improve over 3-4 days. Follow these instructions at home: [facility] care  After your port is placed, you will get a manufacturer's information card. The card has information about your port. Keep this card with you at all times.  Take care of the port as told by your health care provider. Ask your health care provider if you or a family member can get training for taking care of the port at home. A home health care nurse may also take care of the port.  Make sure to remember what type of port you have. Incision care      Follow instructions from your health care provider about how to take care of your port insertion site. Make sure you: ? Wash your hands with soap and water before and after you change your bandage (dressing). If soap and water are not available, use hand sanitizer. ? Remove your dressing as told by your health care provider. In 24 hours ? Leave stitches (sutures), skin glue, or adhesive strips in place. These skin closures may need to stay in place for 2 weeks or longer. If adhesive strip edges start to loosen and curl up, you may trim the loose edges. Do not remove adhesive strips completely unless your health care provider tells you to do that.  Check your port insertion site every day for signs of infection. Check for: ? Redness, swelling, or pain. ? Fluid or blood. ? Warmth. ? Pus or a bad smell. Activity  Return to your normal activities as told by your health care provider. Ask your health care provider what activities are safe for  you.  Do not lift anything that is heavier than 10 lb (4.5 kg), or the limit that you are told, until your health care provider says that it is safe. About 1 week General instructions  Take over-the-counter and prescription medicines only as told by your health care provider.  Do not take baths, swim, or use a hot tub until your health care provider approves. Ask your health care provider if you may take showers. You may only be allowed to take sponge baths.  Do not drive for 24 hours if you were given a sedative during your procedure.  Wear a medical alert bracelet in case of an emergency. This will tell any health care providers that you have a port.  Keep all follow-up visits as told by your health care provider. This is important. Contact a health care provider if:  You cannot flush your port with saline as directed, or you cannot draw blood from the port.  You have a fever or chills.  You have redness, swelling, or pain around your port insertion site.  You have fluid or blood coming from your port insertion site.  Your port insertion site feels warm to the touch.  You have pus or a bad smell coming from the port insertion site. Get help right away if:  You have chest pain or shortness of breath.  You have bleeding from your port that you cannot control. Summary  Take care of the  port as told by your health care provider. Keep the manufacturer's information card with you at all times.  Change your dressing as told by your health care provider.  Contact a health care provider if you have a fever or chills or if you have redness, swelling, or pain around your port insertion site.  Keep all follow-up visits as told by your health care provider. This information is not intended to replace advice given to you by your health care provider. Make sure you discuss any questions you have with your health care provider. Document Revised: 07/29/2017 Document Reviewed:  07/29/2017 Elsevier Patient Education  [place].

## 2019-10-11 NOTE — Procedures (Signed)
Interventional Radiology Procedure Note  Procedure: Single Lumen Power Port Placement    Access:  Left internal jugular vein  Findings: Catheter tip positioned at cavoatrial junction. Port is ready for immediate use. Extensive cervical lymphadenopathy, occlusion of the right IJ.    Complications: None  EBL: < 10 mL  Recommendations:  - Ok to shower in 24 hours - Do not submerge for 7 days - Routine line care    Ruthann Cancer, MD

## 2019-10-12 ENCOUNTER — Ambulatory Visit: Payer: Medicare Other | Admitting: Hematology and Oncology

## 2019-10-12 ENCOUNTER — Ambulatory Visit (INDEPENDENT_AMBULATORY_CARE_PROVIDER_SITE_OTHER): Payer: Medicare Other | Admitting: Nurse Practitioner

## 2019-10-12 ENCOUNTER — Other Ambulatory Visit: Payer: Medicare Other

## 2019-10-12 ENCOUNTER — Other Ambulatory Visit (HOSPITAL_COMMUNITY): Payer: Medicare Other

## 2019-10-12 DIAGNOSIS — G4700 Insomnia, unspecified: Secondary | ICD-10-CM | POA: Diagnosis not present

## 2019-10-12 DIAGNOSIS — G301 Alzheimer's disease with late onset: Secondary | ICD-10-CM | POA: Diagnosis not present

## 2019-10-12 DIAGNOSIS — Z9109 Other allergy status, other than to drugs and biological substances: Secondary | ICD-10-CM

## 2019-10-12 DIAGNOSIS — L299 Pruritus, unspecified: Secondary | ICD-10-CM | POA: Diagnosis not present

## 2019-10-12 DIAGNOSIS — F0280 Dementia in other diseases classified elsewhere without behavioral disturbance: Secondary | ICD-10-CM | POA: Diagnosis not present

## 2019-10-12 MED FILL — Fosaprepitant Dimeglumine For IV Infusion 150 MG (Base Eq): INTRAVENOUS | Qty: 5 | Status: AC

## 2019-10-12 MED FILL — Dexamethasone Sodium Phosphate Inj 100 MG/10ML: INTRAMUSCULAR | Qty: 1 | Status: AC

## 2019-10-12 NOTE — Progress Notes (Signed)
This service is provided via telemedicine  No vital signs collected/recorded due to the encounter was a telemedicine visit.   Location of patient (ex: home, work):  Home  Patient consents to a telephone visit:  Yes, see encounter dated 08/09/2019  Location of the provider (ex: office, home):  [place]  Name of any referring provider:  N/A  Names of all persons participating in the telemedicine service and their role in the encounter:  Sherrie Mustache, Nurse Practitioner, Carroll Kinds, CMA, and patient.   Time spent on call:  5 minutes with medical assistant.      Careteam: Patient Care Team: [name], NP as PCP - General (Geriatric Medicine) Jettie Booze, MD as PCP - Cardiology (Cardiology) Dustin Folks, MD as Consulting Physician (Hematology and Oncology) Esperanza Heir, MD as Consulting Physician (Obstetrics and Gynecology) Consuella Lose, MD as Consulting Physician (Neurosurgery) Deterding, Jeneen Rinks, MD as Consulting Physician (Nephrology)  Advanced Directive information    Allergies  Allergen Reactions  . Shellfish Allergy Swelling and Rash    MOUTH  . Neomy-Bacit-Polymyx-Pramoxine     UNSPECIFIED REACTION  Not sure which antibiotic she has a reaction to    Chief Complaint  Patient presents with  . Acute Visit    Patient having real bad itching all over been going on for a couple of months.Itching not worst, but seems to be getting a little better. Pateint has not tried using anything for the itching.     HPI: Patient is a 85 y.o. female due to itching.  Reports itching has been going on for months but has improved in the last 2 weeks when she restarted her bumex.  Reports itching was all over. If a breeze hit her body it would worsen symptoms.   No changes in medication, laundry detergent, body wash, soap or detergent.   Has dry eyes, reports her nose will occasional drain.   Having nightmares at night  when she is taking medication at night - already taking her aricept in the morning.  Taking losartan, metoprolol and hydralazine at bedtime with trazodone.   Review of Systems:  Review of Systems  Constitutional: Negative for malaise/fatigue.  HENT: Negative for congestion, ear pain and sore throat.        Ears feel stopped up.   Respiratory: Negative for cough, shortness of breath and wheezing.   Gastrointestinal: Negative for constipation, diarrhea and heartburn.  Skin: Positive for itching. Negative for rash.  Neurological: Positive for dizziness and headaches (mild).  Endo/Heme/Allergies: Positive for environmental allergies.   Past Medical History:  Diagnosis Date  . Allergy    mild   . Anemia   . Arthritis    pt states no pain   . Breast cancer ([place])    left   . Chronic kidney disease    ckd stage 4 per lov dr deterding 08-03-2018 on chart  . Dementia ([place])   . Depression   . Gastroesophageal reflux disease   . Heart murmur   . Heartburn   . Hypertension   . Hypothyroidism   . Leg cramps   . Post-menopause bleeding   . Restless leg syndrome 10/07/2014  . Sleep apnea    cpap not in use    Past Surgical History:  Procedure Laterality Date  . back sugery   2019   lower back   . BREAST LUMPECTOMY    . BREAST SURGERY Left 2013   Lumpectomy  radiation done no chemo  .  CATARACT EXTRACTION, BILATERAL    . DILATATION & CURETTAGE/HYSTEROSCOPY WITH MYOSURE N/A 08/23/2019   Procedure: [place];  Surgeon: [name], MD;  Location: [facility];  Service: Gynecology;  Laterality: N/A;  request to follow 2nd case in [place] block requests one hour  . hysteroscopy biopsy    . IR IMAGING GUIDED PORT INSERTION  10/11/2019  . ROBOTIC ASSISTED TOTAL HYSTERECTOMY WITH BILATERAL SALPINGO OOPHERECTOMY Bilateral 09/09/2019   Procedure: XI ROBOTIC ASSISTED TOTAL HYSTERECTOMY WITH BILATERAL SALPINGO OOPHORECTOMY;   Surgeon: Lafonda Mosses, MD;  Location: [facility];  Service: Gynecology;  Laterality: Bilateral;  . SENTINEL NODE BIOPSY N/A 09/09/2019   Procedure: SENTINEL NODE BIOPSY AND POSSIBLE LYMPH NODE DISECTION AND POSSIBLE LAPAROTOMY;  Surgeon: Lafonda Mosses, MD;  Location: [facility];  Service: Gynecology;  Laterality: N/A;   Social History:   reports that she quit smoking about 13 years ago. She has a 2.00 pack-year smoking history. She has never used smokeless tobacco. She reports that she does not drink alcohol and does not use drugs.  Family History  Problem Relation Age of Onset  . Hypertension Mother   . Arthritis Mother   . Heart disease Mother   . Hypertension Father   . Heart disease Father   . Breast cancer Sister   . Lung cancer Brother   . Breast cancer Paternal Aunt   . Colon cancer Neg Hx   . Colon polyps Neg Hx   . Esophageal cancer Neg Hx   . Rectal cancer Neg Hx   . Stomach cancer Neg Hx     Medications: Patient's Medications  New Prescriptions   No medications on file  Previous Medications   ACETAMINOPHEN (TYLENOL) 500 MG TABLET    Take 1,000 mg by mouth every 6 (six) hours as needed for moderate pain.    AMLODIPINE (NORVASC) 10 MG TABLET    TAKE 1 TABLET BY MOUTH EVERY DAY   ASPIRIN 81 MG EC TABLET    Take 81 mg by mouth daily.    BUMETANIDE (BUMEX) 2 MG TABLET    Take 2 mg by mouth daily.   BUSPIRONE (BUSPAR) 10 MG TABLET    TAKE 1 TABLET BY MOUTH TWICE DAILY .DX F41.9 APPT IS OVERDUE   CARBOXYMETHYLCELLULOSE SODIUM (EYE DROPS OP)    Place 1 drop into both eyes daily as needed (dry eyes).    COD LIVER OIL 1000 MG CAPS    Take 1,000 mg by mouth 2 (two) times daily.    DONEPEZIL (ARICEPT) 5 MG TABLET    TAKE 1 TABLET BY MOUTH EVERYDAY AT BEDTIME DX: G31.84   HYDRALAZINE (APRESOLINE) 100 MG TABLET    TAKE 1 TABLET (100 MG TOTAL) BY MOUTH 3 (THREE) TIMES DAILY.   LEVOTHYROXINE (SYNTHROID) 75 MCG TABLET    Take one tablet by mouth once daily 30 minutes before  breakfast for thyroid   LOSARTAN (COZAAR) 100 MG TABLET    TAKE 1 TABLET BY MOUTH EVERYDAY AT BEDTIME   METOPROLOL SUCCINATE (TOPROL-XL) 100 MG 24 HR TABLET    Take 1 tablet (100 mg total) by mouth in the morning and at bedtime. Take with or immediately following a meal.   MULTIPLE VITAMIN (MULTIVITAMIN ADULT PO)    Take 1 tablet by mouth daily.   SENNA-DOCUSATE (SENOKOT-S) 8.6-50 MG TABLET    Take 2 tablets by mouth at bedtime. For AFTER surgery, do not take if having diarrhea   TRAZODONE (DESYREL) 50 MG  TABLET    Take one tablet by mouth once daily at bedtime  Modified Medications   No medications on file  Discontinued Medications   ENOXAPARIN (LOVENOX) 30 MG/0.3ML INJECTION    1 syringe [place] qhs start on POD #2    Physical Exam:  There were no vitals filed for this visit. There is no height or weight on file to calculate BMI. Wt Readings from Last 3 Encounters:  10/11/19 185 lb (83.9 kg)  09/29/19 176 lb (79.8 kg)  09/29/19 176 lb 9.6 oz (80.1 kg)      Labs reviewed: Basic Metabolic Panel: Recent Labs    08/31/19 1442 09/06/19 1346 09/29/19 1100  NA 139 140 138  K 4.7 4.6 4.7  CL 107 108 109  CO2 23 22 19*  GLUCOSE 95 95 91  BUN 24 25* 21  CREATININE 1.90* 2.05* 1.78*  CALCIUM 9.0 9.1 8.7  TSH 0.47  --   --    Liver Function Tests: Recent Labs    08/31/19 1442 09/06/19 1346 09/29/19 1100  AST 26 21 20   ALT 15 15 13   ALKPHOS  --  39  --   BILITOT 0.3 0.4 0.3  PROT 6.9 7.5 6.8  ALBUMIN  --  3.5  --    Recent Labs    09/29/19 1100  LIPASE 49  AMYLASE 99   No results for input(s): AMMONIA in the last 8760 hours. CBC: Recent Labs    08/31/19 1442 09/06/19 1346 09/29/19 1100  WBC 5.0 4.9 3.8  NEUTROABS 3,065  --  1,965  HGB 9.7* 9.4* 9.0*  HCT 29.3* 29.3* 27.6*  MCV 89.6 93.0 91.4  PLT 282 299 387   Lipid Panel: Recent Labs    08/31/19 1442  CHOL 157  HDL 35*  LDLCALC 102*  TRIG 100  CHOLHDL 4.5   TSH: Recent Labs    08/31/19 1442   TSH 0.47   A1C: No results found for: HGBA1C   Assessment/Plan 1. Environmental allergies Likely contributing to her itching however could be multifactorial, encouraged to start zyrtec 10 mg daily    2. Pruritus  Could be several contributing factors. To start zyrtec to decrease histamine, to take out dyes and scents from lotions, laundry detergent and soaps. To use creams vs lotions.  3. Late onset Alzheimer's disease without behavioral disturbance ([place]) Having increase in vivid dreams/nightmares, aricept could be the cause of this however already taking during the day time.   4. Insomnia, unspecified type -ongoing, using trazodone but having vivid dreams/nightmares -encouraged to take medications such as losartan during the day -may need to titrate trazodone to see if this cause?  Next appt: 12/31/2019 Regina Porter. Harle Battiest  [facility] [phone]    Virtual Visit via Deloris Ping  I connected with patient on 10/12/19 at 11:00 AM EDT by video and verified that I am speaking with the correct person using two identifiers.  Location: Patient: home Provider: [facility]   I discussed the limitations, risks, security and privacy concerns of performing an evaluation and management service by telephone and the availability of in person appointments. I also discussed with the patient that there may be a patient responsible charge related to this service. The patient expressed understanding and agreed to proceed.   I discussed the assessment and treatment plan with the patient. The patient was provided an opportunity to ask questions and all were answered. The patient agreed with the plan and demonstrated an understanding  of the instructions.   The patient was advised to call back or seek an in-person evaluation if the symptoms worsen or if the condition fails to improve as anticipated.  I provided 25  minutes of non-face-to-face time during this  encounter.  Regina Porter. Harle Battiest Avs printed and mailed

## 2019-10-12 NOTE — Patient Instructions (Signed)
To move losartan to the morning If that does not help with the dreams to decrease trazodone to 25 mg at bedtime  To start zyrtec 10 mg (can use generic) daily To take all all scents from laundry, soap and lotions  To use cream vs lotion  cerave or Eucerin and apply right after

## 2019-10-13 ENCOUNTER — Other Ambulatory Visit: Payer: Medicare Other

## 2019-10-13 ENCOUNTER — Inpatient Hospital Stay: Payer: Medicare Other

## 2019-10-13 ENCOUNTER — Ambulatory Visit: Payer: Medicare Other

## 2019-10-13 ENCOUNTER — Other Ambulatory Visit: Payer: Self-pay | Admitting: Hematology and Oncology

## 2019-10-13 ENCOUNTER — Other Ambulatory Visit: Payer: Self-pay

## 2019-10-13 ENCOUNTER — Encounter: Payer: Self-pay | Admitting: Hematology and Oncology

## 2019-10-13 ENCOUNTER — Inpatient Hospital Stay (HOSPITAL_BASED_OUTPATIENT_CLINIC_OR_DEPARTMENT_OTHER): Payer: Medicare Other | Admitting: Hematology and Oncology

## 2019-10-13 VITALS — BP 156/41 | HR 70 | Temp 97.5°F | Resp 18 | Ht 65.0 in | Wt 172.4 lb

## 2019-10-13 DIAGNOSIS — Z923 Personal history of irradiation: Secondary | ICD-10-CM | POA: Diagnosis not present

## 2019-10-13 DIAGNOSIS — N184 Chronic kidney disease, stage 4 (severe): Secondary | ICD-10-CM | POA: Diagnosis not present

## 2019-10-13 DIAGNOSIS — C541 Malignant neoplasm of endometrium: Secondary | ICD-10-CM

## 2019-10-13 DIAGNOSIS — Z853 Personal history of malignant neoplasm of breast: Secondary | ICD-10-CM | POA: Diagnosis not present

## 2019-10-13 DIAGNOSIS — D631 Anemia in chronic kidney disease: Secondary | ICD-10-CM | POA: Diagnosis not present

## 2019-10-13 DIAGNOSIS — C778 Secondary and unspecified malignant neoplasm of lymph nodes of multiple regions: Secondary | ICD-10-CM | POA: Diagnosis not present

## 2019-10-13 DIAGNOSIS — Z87891 Personal history of nicotine dependence: Secondary | ICD-10-CM | POA: Diagnosis not present

## 2019-10-13 LAB — CBC WITH DIFFERENTIAL (CANCER CENTER ONLY)
Abs Immature Granulocytes: 0 10*3/uL (ref 0.00–0.07)
Basophils Absolute: 0 10*3/uL (ref 0.0–0.1)
Basophils Relative: 0 %
Eosinophils Absolute: 0.1 10*3/uL (ref 0.0–0.5)
Eosinophils Relative: 2 %
HCT: 30.3 % — ABNORMAL LOW (ref 36.0–46.0)
Hemoglobin: 9.8 g/dL — ABNORMAL LOW (ref 12.0–15.0)
Immature Granulocytes: 0 %
Lymphocytes Relative: 35 %
Lymphs Abs: 1.6 10*3/uL (ref 0.7–4.0)
MCH: 29.6 pg (ref 26.0–34.0)
MCHC: 32.3 g/dL (ref 30.0–36.0)
MCV: 91.5 fL (ref 80.0–100.0)
Monocytes Absolute: 0.5 10*3/uL (ref 0.1–1.0)
Monocytes Relative: 11 %
Neutro Abs: 2.3 10*3/uL (ref 1.7–7.7)
Neutrophils Relative %: 52 %
Platelet Count: 277 10*3/uL (ref 150–400)
RBC: 3.31 MIL/uL — ABNORMAL LOW (ref 3.87–5.11)
RDW: 15.7 % — ABNORMAL HIGH (ref 11.5–15.5)
WBC Count: 4.5 10*3/uL (ref 4.0–10.5)
nRBC: 0 % (ref 0.0–0.2)

## 2019-10-13 LAB — CMP (CANCER CENTER ONLY)
ALT: 10 U/L (ref 0–44)
AST: 23 U/L (ref 15–41)
Albumin: 3.4 g/dL — ABNORMAL LOW (ref 3.5–5.0)
Alkaline Phosphatase: 44 U/L (ref 38–126)
Anion gap: 11 (ref 5–15)
BUN: 37 mg/dL — ABNORMAL HIGH (ref 8–23)
CO2: 22 mmol/L (ref 22–32)
Calcium: 9.4 mg/dL (ref 8.9–10.3)
Chloride: 105 mmol/L (ref 98–111)
Creatinine: 3.17 mg/dL (ref 0.44–1.00)
GFR, Est AFR Am: 15 mL/min — ABNORMAL LOW (ref >60)
GFR, Estimated: 13 mL/min — ABNORMAL LOW (ref >60)
Glucose, Bld: 100 mg/dL — ABNORMAL HIGH (ref 70–99)
Potassium: 4.7 mmol/L (ref 3.5–5.1)
Sodium: 138 mmol/L (ref 135–145)
Total Bilirubin: 0.3 mg/dL (ref 0.3–1.2)
Total Protein: 7.9 g/dL (ref 6.5–8.1)

## 2019-10-13 MED ORDER — PROCHLORPERAZINE MALEATE 10 MG PO TABS: 10.0000 mg | ORAL_TABLET | Freq: Four times a day (QID) | ORAL | 1 refills | Status: DC | PRN

## 2019-10-13 MED ORDER — ONDANSETRON HCL 8 MG PO TABS: 8.0000 mg | ORAL_TABLET | Freq: Three times a day (TID) | ORAL | 1 refills | Status: DC | PRN

## 2019-10-13 MED ORDER — DEXAMETHASONE 4 MG PO TABS: ORAL_TABLET | ORAL | 6 refills | Status: DC

## 2019-10-13 MED ORDER — LIDOCAINE-PRILOCAINE 2.5-2.5 % EX CREA: TOPICAL_CREAM | CUTANEOUS | 3 refills | Status: DC

## 2019-10-13 NOTE — Progress Notes (Signed)
Reported critical creatinine of 3.17 to Dr. Alvy Bimler.

## 2019-10-13 NOTE — Progress Notes (Signed)
Met with patient/accompanying adult to introduce myself as Financial Resource Specialist and to offer available resources.  Discussed one-time $1000 [employer] grant and qualifications to assist with personal expenses while going through treatment.   Gave her my card if interested in applying and for any additional financial questions or concerns.    

## 2019-10-14 ENCOUNTER — Encounter: Payer: Self-pay | Admitting: Hematology and Oncology

## 2019-10-14 DIAGNOSIS — N184 Chronic kidney disease, stage 4 (severe): Secondary | ICD-10-CM | POA: Diagnosis not present

## 2019-10-14 DIAGNOSIS — Z48816 Encounter for surgical aftercare following surgery on the genitourinary system: Secondary | ICD-10-CM | POA: Diagnosis not present

## 2019-10-14 DIAGNOSIS — D631 Anemia in chronic kidney disease: Secondary | ICD-10-CM | POA: Insufficient documentation

## 2019-10-14 DIAGNOSIS — E039 Hypothyroidism, unspecified: Secondary | ICD-10-CM | POA: Diagnosis not present

## 2019-10-14 DIAGNOSIS — C541 Malignant neoplasm of endometrium: Secondary | ICD-10-CM | POA: Diagnosis not present

## 2019-10-14 DIAGNOSIS — D630 Anemia in neoplastic disease: Secondary | ICD-10-CM | POA: Diagnosis not present

## 2019-10-14 DIAGNOSIS — I129 Hypertensive chronic kidney disease with stage 1 through stage 4 chronic kidney disease, or unspecified chronic kidney disease: Secondary | ICD-10-CM | POA: Diagnosis not present

## 2019-10-14 NOTE — Assessment & Plan Note (Signed)
She is not symptomatic Observe closely for now 

## 2019-10-14 NOTE — Assessment & Plan Note (Signed)
She will need upfront dose adjustment due to chronic kidney disease stage IV We will adjust the dose of carboplatin according to creatinine and 50% dose adjustment for Taxol

## 2019-10-14 NOTE — Progress Notes (Signed)
[facility] OFFICE PROGRESS NOTE  Patient Care Team: Sharon Seller, NP as PCP - General (Geriatric Medicine) Corky Crafts, MD as PCP - Cardiology (Cardiology) Silvestre Moment, MD as Consulting Physician (Hematology and Oncology) Argentina Ponder, MD as Consulting Physician (Obstetrics and Gynecology) Lisbeth Renshaw, MD as Consulting Physician (Neurosurgery) Deterding, Fayrene Fearing, MD as Consulting Physician (Nephrology)  ASSESSMENT & PLAN:  Endometrial cancer [facility]) Her case was reviewed at the GYN oncology tumor board We discussed the role of adjuvant treatment She has high risk disease due to lymphovascular invasion and stage IV diagnosis  We reviewed the NCCN guidelines We discussed the role of chemotherapy. The intent is of curative intent.  We discussed some of the risks, benefits, side-effects of carboplatin & Taxol. Treatment is intravenous, every 3 weeks x 6 cycles  Some of the short term side-effects included, though not limited to, including weight loss, life threatening infections, risk of allergic reactions, need for transfusions of blood products, nausea, vomiting, change in bowel habits, loss of hair, admission to hospital for various reasons, and risks of death.   Long term side-effects are also discussed including risks of infertility, permanent damage to nerve function, hearing loss, chronic fatigue, kidney damage with possibility needing hemodialysis, and rare secondary malignancy including bone marrow disorders.  The patient is aware that the response rates discussed earlier is not guaranteed.  After a long discussion, patient made an informed decision to proceed with the prescribed plan of care.   Patient education material was dispensed. We discussed premedication with dexamethasone before chemotherapy. I have verified the port access site is well-healed She is attending chemo education class soon Due to her chronic kidney disease, I  plan upfront 50% dose adjustment for Taxol and dose adjustment for carboplatin I will see her back prior to cycle 2 of treatment for toxicity review  Chronic kidney disease, stage 4 (severe) (HCC) She will need upfront dose adjustment due to chronic kidney disease stage IV We will adjust the dose of carboplatin according to creatinine and 50% dose adjustment for Taxol  Anemia of chronic renal failure, stage 4 (severe) (HCC) She is not symptomatic Observe closely for now   No orders of the defined types were placed in this encounter.   All questions were answered. The patient knows to call the clinic with any problems, questions or concerns. The total time spent in the appointment was 25 minutes encounter with patients including review of chart and various tests results, discussions about plan of care and coordination of care plan   Artis Delay, MD 10/14/2019 11:12 AM  INTERVAL HISTORY: Please see below for problem oriented charting. She returns for chemotherapy consent and chemo class today She tolerated port placement well She is not symptomatic  SUMMARY OF ONCOLOGIC HISTORY: Oncology History Overview Note  MMR IHC normal, MSI-stable HER2 negative (1+)   History of left breast cancer  Endometrial cancer (HCC)  08/23/2019 Initial Biopsy   D&C A. ENDOMETRIUM, CURETTAGE:  -  High-grade carcinoma  -  See comment  COMMENT:  The biopsy consists of a high-grade carcinoma.  Based on the biopsy the differential diagnosis includes serous, high-grade endometrioid and undifferentiated carcinoma.  These results were discussed with Dr. Sharol Roussel nurse, Olegario Messier, on August 24, 2019.    08/27/2019 Initial Diagnosis   Endometrial cancer (HCC)   09/01/2019 Imaging   CT C/A/P: 1. Enlarged uterus and heterogeneous endometrial cavity, consistent with the given diagnosis of uterine/cervical cancer. Small to borderline enlarged pelvic retroperitoneal  lymph nodes are highly worrisome for metastatic  disease. 2. Borderline enlarged thoracic inlet and mediastinal lymph nodes also worrisome for malignancy. 3. Tiny subpleural nodules are likely benign subpleural lymph nodes. Recommend attention on follow-up. 4. Aortic atherosclerosis (ICD10-I70.0). Coronary artery calcification.   09/09/2019 Surgery   Robotic-assisted laparoscopic total hysterectomy with bilateral salpingoophorectomy, SLN biopsy, right pelvic and para-aortic lymphadenectomy  On EUA, bulky enlarged uterus, mobile. Normal upper abdominal survey although prominent gallbladder and some filmy adhesions between the liver and anterior abdominal wall. Normal appearing omentum, small and large bowel. Uterus quite bulky and 10-12cm. Normal appearing adnexa. Mapping successful to right obturator and left external iliac SLNs. Multiple obturator lymph nodes on the right, an external iliac lymph node on the right, and lymph nodes along the common iliac and aorta on the right all enlarged, adherent to surround vessels, and filled with tumor. All grossly involved lymph nodes removed.   09/09/2019 Pathology Results   A. SENTINEL LYMPH NODE, RIGHT OBTURATOR, ADJACENT ENLARGED LYMPH NODE,  EXCISION:  - Metastatic serous carcinoma, see comment   B. LYMPH NODE, RIGHT EXTERNAL ILIAC, BIOPSY:  - Benign lymph node, negative for carcinoma (0/1)   C. SENTINEL LYMPH NODE, LEFT EXTERNAL ILIAC, BIOPSY:  - Benign lymph node, negative for carcinoma (0/1)   D. 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 UTERUS, CERVIX, BILATERAL TUBE AND OVARIES:  - Invasive high-grade serous carcinoma, 8.2 cm  - Carcinoma invades for depth of 2.0 cm with a myometrial thickness is  2.2 cm  - Carcinoma invades into the cervical stroma  - Extensive lymphovascular invasion is present  - Benign unremarkable bilateral fallopian tubes and ovaries  - See oncology table   F. 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 LYMPH NODES, RIGHT COMMON AND PERIAORTIC, RESECTION:  - Metastatic serous carcinoma, see comment      COMMENT:   A.   Lymphoid tissue is not identified.  Findings likely represent an  entirely replaced lymph node(s).   G. Multiple fragments of lymph node(s) were received. The exact number  of lymph nodes cannot be determined but does not affect the nodal stage.     ONCOLOGY TABLE:   UTERUS, CARCINOMA OR CARCINOSARCOMA   Procedure: Total hysterectomy and bilateral salpingo-oophorectomy  Histologic type: Serous carcinoma  Histologic Grade: High-grade  Myometrial invasion:       Depth of invasion: 20 mm       Myometrial thickness: 22 mm  Uterine Serosa Involvement: Not identified  Cervical stromal involvement: Present  Extent of involvement of other organs: Not identified  Lymphovascular invasion: Present, extensive  Regional Lymph Nodes:        Examined:        At least 3 Sentinel                               At least 3 Non-sentinel                               At least 6 Total        Lymph nodes with metastasis: 4        Isolated tumor cells (<0.2 mm): 0        Micrometastasis: (>0.2 mm and < 2.0 mm): 0  Macrometastasis: (>2.0 mm): 4        Extracapsular extension: Present  Representative Tumor Block: E8  MMR / MSI testing: Will be ordered  Pathologic Stage Classification (pTNM, AJCC 8th edition):  pT2, pN2a    09/09/2019 Tumor Marker   Patient's tumor was tested for the following markers: CA-125. Results of the tumor marker test revealed: 181.   09/09/2019 Cancer Staging   Staging form: Corpus Uteri - Carcinoma and Carcinosarcoma, AJCC 8th Edition - Clinical stage from 09/09/2019: FIGO Stage IIIC2, calculated as Stage IVB (cT2, cN2a, pM1) - Signed by Lafonda Mosses, MD on 09/15/2019   10/11/2019 Procedure   Successful placement of a power injectable Port-A-Cath via the left internal jugular  vein. The catheter is ready for immediate use. Prominent right cervical lymphadenopathy and occlusion of the right internal jugular vein precluded right-sided placement.       10/21/2019 -  Chemotherapy   The patient had palonosetron (ALOXI) injection 0.25 mg, 0.25 mg, Intravenous,  Once, 0 of 6 cycles CARBOplatin (PARAPLATIN) 330 mg in sodium chloride 0.9 % 100 mL chemo infusion, 250 mg (75.9 % of original dose 331.2 mg), Intravenous,  Once, 0 of 6 cycles Dose modification:   (original dose 331.2 mg, Cycle 1) fosaprepitant (EMEND) 150 mg in sodium chloride 0.9 % 145 mL IVPB, 150 mg, Intravenous,  Once, 0 of 6 cycles PACLitaxel (TAXOL) 168 mg in sodium chloride 0.9 % 250 mL chemo infusion (> $RemoveBef'80mg'jjEWlpUQCZ$ /m2), 87.5 mg/m2 = 168 mg (50 % of original dose 175 mg/m2), Intravenous,  Once, 0 of 6 cycles Dose modification: 87.5 mg/m2 (50 % of original dose 175 mg/m2, Cycle 1, Reason: Provider Judgment)  for chemotherapy treatment.      REVIEW OF SYSTEMS:   Constitutional: Denies fevers, chills or abnormal weight loss Eyes: Denies blurriness of vision Ears, nose, mouth, throat, and face: Denies mucositis or sore throat Respiratory: Denies cough, dyspnea or wheezes Cardiovascular: Denies palpitation, chest discomfort or lower extremity swelling Gastrointestinal:  Denies nausea, heartburn or change in bowel habits Skin: Denies abnormal skin rashes Lymphatics: Denies new lymphadenopathy or easy bruising Neurological:Denies numbness, tingling or new weaknesses Behavioral/Psych: Mood is stable, no new changes  All other systems were reviewed with the patient and are negative.  I have reviewed the past medical history, past surgical history, social history and family history with the patient and they are unchanged from previous note.  ALLERGIES:  is allergic to shellfish allergy and neomy-bacit-polymyx-pramoxine.  MEDICATIONS:  Current Outpatient Medications  Medication Sig Dispense Refill  .  acetaminophen (TYLENOL) 500 MG tablet Take 1,000 mg by mouth every 6 (six) hours as needed for moderate pain.     Marland Kitchen amLODipine (NORVASC) 10 MG tablet TAKE 1 TABLET BY MOUTH EVERY DAY 90 tablet 0  . aspirin 81 MG EC tablet Take 81 mg by mouth daily.     . bumetanide (BUMEX) 2 MG tablet Take 2 mg by mouth daily.  6  . busPIRone (BUSPAR) 10 MG tablet TAKE 1 TABLET BY MOUTH TWICE DAILY .DX F41.9 APPT IS OVERDUE 180 tablet 1  . Carboxymethylcellulose Sodium (EYE DROPS OP) Place 1 drop into both eyes daily as needed (dry eyes).     Marland Kitchen Cod Liver Oil 1000 MG CAPS Take 1,000 mg by mouth 2 (two) times daily.     Marland Kitchen dexamethasone (DECADRON) 4 MG tablet Take 2 tabs at the night before and 2 tab the morning of chemotherapy, every 3 weeks, by mouth x 6 cycles 36 tablet  6  . donepezil (ARICEPT) 5 MG tablet TAKE 1 TABLET BY MOUTH EVERYDAY AT BEDTIME DX: G31.84 90 tablet 0  . hydrALAZINE (APRESOLINE) 100 MG tablet TAKE 1 TABLET (100 MG TOTAL) BY MOUTH 3 (THREE) TIMES DAILY. 270 tablet 1  . levothyroxine (SYNTHROID) 75 MCG tablet Take one tablet by mouth once daily 30 minutes before breakfast for thyroid 90 tablet 0  . lidocaine-prilocaine (EMLA) cream Apply to affected area once 30 g 3  . losartan (COZAAR) 100 MG tablet TAKE 1 TABLET BY MOUTH EVERYDAY AT BEDTIME 90 tablet 1  . metoprolol succinate (TOPROL-XL) 100 MG 24 hr tablet Take 1 tablet (100 mg total) by mouth in the morning and at bedtime. Take with or immediately following a meal. 180 tablet 0  . Multiple Vitamin (MULTIVITAMIN ADULT PO) Take 1 tablet by mouth daily.    . ondansetron (ZOFRAN) 8 MG tablet Take 1 tablet (8 mg total) by mouth every 8 (eight) hours as needed. Start on the third day after chemotherapy but only take if needed 30 tablet 1  . prochlorperazine (COMPAZINE) 10 MG tablet Take 1 tablet (10 mg total) by mouth every 6 (six) hours as needed (Nausea or vomiting). 30 tablet 1  . senna-docusate (SENOKOT-S) 8.6-50 MG tablet Take 2 tablets by mouth  at bedtime. For AFTER surgery, do not take if having diarrhea 30 tablet 0  . traZODone (DESYREL) 50 MG tablet Take one tablet by mouth once daily at bedtime 90 tablet 0   No current facility-administered medications for this visit.    PHYSICAL EXAMINATION: ECOG PERFORMANCE STATUS: 1 - Symptomatic but completely ambulatory  Vitals:   10/13/19 1336  BP: (!) 156/41  Pulse: 70  Resp: 18  Temp: (!) 97.5 F (36.4 C)  SpO2: 100%   Filed Weights   10/13/19 1336  Weight: 172 lb 6.4 oz (78.2 kg)    GENERAL:alert, no distress and comfortable NEURO: alert & oriented x 3 with fluent speech, no focal motor/sensory deficits  LABORATORY DATA:  I have reviewed the data as listed    Component Value Date/Time   NA 138 10/13/2019 1312   K 4.7 10/13/2019 1312   CL 105 10/13/2019 1312   CO2 22 10/13/2019 1312   GLUCOSE 100 (H) 10/13/2019 1312   BUN 37 (H) 10/13/2019 1312   CREATININE 3.17 (HH) 10/13/2019 1312   CREATININE 1.78 (H) 09/29/2019 1100   CALCIUM 9.4 10/13/2019 1312   PROT 7.9 10/13/2019 1312   ALBUMIN 3.4 (L) 10/13/2019 1312   AST 23 10/13/2019 1312   ALT 10 10/13/2019 1312   ALKPHOS 44 10/13/2019 1312   BILITOT 0.3 10/13/2019 1312   GFRNONAA 13 (L) 10/13/2019 1312   GFRNONAA 26 (L) 09/29/2019 1100   GFRAA 15 (L) 10/13/2019 1312   GFRAA 30 (L) 09/29/2019 1100    No results found for: SPEP, UPEP  Lab Results  Component Value Date   WBC 4.5 10/13/2019   NEUTROABS 2.3 10/13/2019   HGB 9.8 (L) 10/13/2019   HCT 30.3 (L) 10/13/2019   MCV 91.5 10/13/2019   PLT 277 10/13/2019      Chemistry      Component Value Date/Time   NA 138 10/13/2019 1312   K 4.7 10/13/2019 1312   CL 105 10/13/2019 1312   CO2 22 10/13/2019 1312   BUN 37 (H) 10/13/2019 1312   CREATININE 3.17 (HH) 10/13/2019 1312   CREATININE 1.78 (H) 09/29/2019 1100      Component Value Date/Time   CALCIUM 9.4  10/13/2019 1312   ALKPHOS 44 10/13/2019 1312   AST 23 10/13/2019 1312   ALT 10 10/13/2019  1312   BILITOT 0.3 10/13/2019 1312

## 2019-10-14 NOTE — Assessment & Plan Note (Signed)
Her case was reviewed at the GYN oncology tumor board We discussed the role of adjuvant treatment She has high risk disease due to lymphovascular invasion and stage IV diagnosis  We reviewed the NCCN guidelines We discussed the role of chemotherapy. The intent is of curative intent.  We discussed some of the risks, benefits, side-effects of carboplatin & Taxol. Treatment is intravenous, every 3 weeks x 6 cycles  Some of the short term side-effects included, though not limited to, including weight loss, life threatening infections, risk of allergic reactions, need for transfusions of blood products, nausea, vomiting, change in bowel habits, loss of hair, admission to hospital for various reasons, and risks of death.   Long term side-effects are also discussed including risks of infertility, permanent damage to nerve function, hearing loss, chronic fatigue, kidney damage with possibility needing hemodialysis, and rare secondary malignancy including bone marrow disorders.  The patient is aware that the response rates discussed earlier is not guaranteed.  After a long discussion, patient made an informed decision to proceed with the prescribed plan of care.   Patient education material was dispensed. We discussed premedication with dexamethasone before chemotherapy. I have verified the port access site is well-healed She is attending chemo education class soon Due to her chronic kidney disease, I plan upfront 50% dose adjustment for Taxol and dose adjustment for carboplatin I will see her back prior to cycle 2 of treatment for toxicity review

## 2019-10-15 ENCOUNTER — Ambulatory Visit: Payer: Medicare Other | Admitting: Nurse Practitioner

## 2019-10-15 NOTE — Progress Notes (Signed)
Pharmacist Chemotherapy Monitoring - Initial Assessment    Anticipated start date: 10/21/19   Regimen:  . Are orders appropriate based on the patient's diagnosis, regimen, and cycle? Yes . Does the plan date match the patient's scheduled date? Yes . Is the sequencing of drugs appropriate? Yes . Are the premedications appropriate for the patient's regimen? Yes . Prior Authorization for treatment is: Approved o If applicable, is the correct biosimilar selected based on the patient's insurance? not applicable  Organ Function and Labs: Marland Kitchen Are dose adjustments needed based on the patient's renal function, hepatic function, or hematologic function? Yes . Are appropriate labs ordered prior to the start of patient's treatment? Yes . Other organ system assessment, if indicated: N/A . The following baseline labs, if indicated, have been ordered: N/A  Dose Assessment: . Are the drug doses appropriate? Yes . Are the following correct: o Drug concentrations Yes o IV fluid compatible with drug Yes o Administration routes Yes o Timing of therapy Yes . If applicable, does the patient have documented access for treatment and/or plans for port-a-cath placement? yes . If applicable, have lifetime cumulative doses been properly documented and assessed? yes Lifetime Dose Tracking  No doses have been documented on this patient for the following tracked chemicals: Doxorubicin, Epirubicin, Idarubicin, Daunorubicin, Mitoxantrone, Bleomycin, Oxaliplatin, Carboplatin, Liposomal Doxorubicin  o   Toxicity Monitoring/Prevention: . The patient has the following take home antiemetics prescribed: Ondansetron, Prochlorperazine and Dexamethasone . The patient has the following take home medications prescribed: N/A . Medication allergies and previous infusion related reactions, if applicable, have been reviewed and addressed. Yes . The patient's current medication list has been assessed for drug-drug interactions with  their chemotherapy regimen. no significant drug-drug interactions were identified on review.  Order Review: . Are the treatment plan orders signed? Yes . Is the patient scheduled to see a provider prior to their treatment? No  I verify that I have reviewed each item in the above checklist and answered each question accordingly.  Romualdo Bolk [facility] 10/15/2019 5:23 PM

## 2019-10-18 DIAGNOSIS — N184 Chronic kidney disease, stage 4 (severe): Secondary | ICD-10-CM | POA: Diagnosis not present

## 2019-10-18 DIAGNOSIS — E039 Hypothyroidism, unspecified: Secondary | ICD-10-CM | POA: Diagnosis not present

## 2019-10-18 DIAGNOSIS — D630 Anemia in neoplastic disease: Secondary | ICD-10-CM | POA: Diagnosis not present

## 2019-10-18 DIAGNOSIS — C541 Malignant neoplasm of endometrium: Secondary | ICD-10-CM | POA: Diagnosis not present

## 2019-10-18 DIAGNOSIS — I129 Hypertensive chronic kidney disease with stage 1 through stage 4 chronic kidney disease, or unspecified chronic kidney disease: Secondary | ICD-10-CM | POA: Diagnosis not present

## 2019-10-18 DIAGNOSIS — Z48816 Encounter for surgical aftercare following surgery on the genitourinary system: Secondary | ICD-10-CM | POA: Diagnosis not present

## 2019-10-20 DIAGNOSIS — Z48816 Encounter for surgical aftercare following surgery on the genitourinary system: Secondary | ICD-10-CM | POA: Diagnosis not present

## 2019-10-20 DIAGNOSIS — C541 Malignant neoplasm of endometrium: Secondary | ICD-10-CM | POA: Diagnosis not present

## 2019-10-20 DIAGNOSIS — I129 Hypertensive chronic kidney disease with stage 1 through stage 4 chronic kidney disease, or unspecified chronic kidney disease: Secondary | ICD-10-CM | POA: Diagnosis not present

## 2019-10-20 DIAGNOSIS — N184 Chronic kidney disease, stage 4 (severe): Secondary | ICD-10-CM | POA: Diagnosis not present

## 2019-10-20 DIAGNOSIS — E039 Hypothyroidism, unspecified: Secondary | ICD-10-CM | POA: Diagnosis not present

## 2019-10-20 DIAGNOSIS — D630 Anemia in neoplastic disease: Secondary | ICD-10-CM | POA: Diagnosis not present

## 2019-10-21 ENCOUNTER — Other Ambulatory Visit: Payer: Self-pay

## 2019-10-21 ENCOUNTER — Inpatient Hospital Stay: Payer: Medicare Other | Attending: Hematology and Oncology

## 2019-10-21 VITALS — BP 138/54 | HR 70 | Temp 98.0°F | Resp 20 | Ht 65.0 in | Wt 168.5 lb

## 2019-10-21 DIAGNOSIS — Z5111 Encounter for antineoplastic chemotherapy: Secondary | ICD-10-CM | POA: Diagnosis not present

## 2019-10-21 DIAGNOSIS — C541 Malignant neoplasm of endometrium: Secondary | ICD-10-CM | POA: Insufficient documentation

## 2019-10-21 MED ORDER — SODIUM CHLORIDE 0.9% FLUSH
10.0000 mL | INTRAVENOUS | Status: DC | PRN
  Administered 2019-10-21: 10 mL
  Filled 2019-10-21: qty 10

## 2019-10-21 MED ORDER — SODIUM CHLORIDE 0.9 % IV SOLN
87.5000 mg/m2 | Freq: Once | INTRAVENOUS | Status: AC
  Administered 2019-10-21: 168 mg via INTRAVENOUS
  Filled 2019-10-21: qty 28

## 2019-10-21 MED ORDER — PALONOSETRON HCL INJECTION 0.25 MG/5ML
0.2500 mg | Freq: Once | INTRAVENOUS | Status: AC
  Administered 2019-10-21: 0.25 mg via INTRAVENOUS

## 2019-10-21 MED ORDER — FAMOTIDINE IN NACL 20-0.9 MG/50ML-% IV SOLN
INTRAVENOUS | Status: AC
  Filled 2019-10-21: qty 50

## 2019-10-21 MED ORDER — FAMOTIDINE IN NACL 20-0.9 MG/50ML-% IV SOLN
20.0000 mg | Freq: Once | INTRAVENOUS | Status: AC
  Administered 2019-10-21: 20 mg via INTRAVENOUS

## 2019-10-21 MED ORDER — SODIUM CHLORIDE 0.9 % IV SOLN
Freq: Once | INTRAVENOUS | Status: AC
  Filled 2019-10-21: qty 250

## 2019-10-21 MED ORDER — SODIUM CHLORIDE 0.9 % IV SOLN
150.0000 mg | Freq: Once | INTRAVENOUS | Status: AC
  Administered 2019-10-21: 150 mg via INTRAVENOUS
  Filled 2019-10-21: qty 150

## 2019-10-21 MED ORDER — SODIUM CHLORIDE 0.9 % IV SOLN
10.0000 mg | Freq: Once | INTRAVENOUS | Status: AC
  Administered 2019-10-21: 10 mg via INTRAVENOUS
  Filled 2019-10-21: qty 10

## 2019-10-21 MED ORDER — SODIUM CHLORIDE 0.9 % IV SOLN
251.4000 mg | Freq: Once | INTRAVENOUS | Status: AC
  Administered 2019-10-21: 250 mg via INTRAVENOUS
  Filled 2019-10-21: qty 25

## 2019-10-21 MED ORDER — PALONOSETRON HCL INJECTION 0.25 MG/5ML
INTRAVENOUS | Status: AC
  Filled 2019-10-21: qty 5

## 2019-10-21 MED ORDER — DIPHENHYDRAMINE HCL 50 MG/ML IJ SOLN
INTRAMUSCULAR | Status: AC
  Filled 2019-10-21: qty 1

## 2019-10-21 MED ORDER — DIPHENHYDRAMINE HCL 50 MG/ML IJ SOLN
12.5000 mg | Freq: Once | INTRAMUSCULAR | Status: AC
  Administered 2019-10-21: 12.5 mg via INTRAVENOUS

## 2019-10-21 MED ORDER — HEPARIN SOD (PORK) LOCK FLUSH 100 UNIT/ML IV SOLN
500.0000 [IU] | Freq: Once | INTRAVENOUS | Status: AC | PRN
  Administered 2019-10-21: 500 [IU]
  Filled 2019-10-21: qty 5

## 2019-10-21 NOTE — Patient Instructions (Signed)
[facility] Discharge Instructions for Patients Receiving Chemotherapy  Today you received the following chemotherapy agents Paclitaxel (TAXOL) & Carboplatin (PARAPLATIN).  To help prevent nausea and vomiting after your treatment, we encourage you to take your nausea medication as prescribed.   If you develop nausea and vomiting that is not controlled by your nausea medication, call the clinic.   BELOW ARE SYMPTOMS THAT SHOULD BE REPORTED IMMEDIATELY:  *FEVER GREATER THAN 100.5 F  *CHILLS WITH OR WITHOUT FEVER  NAUSEA AND VOMITING THAT IS NOT CONTROLLED WITH YOUR NAUSEA MEDICATION  *UNUSUAL SHORTNESS OF BREATH  *UNUSUAL BRUISING OR BLEEDING  TENDERNESS IN MOUTH AND THROAT WITH OR WITHOUT PRESENCE OF ULCERS  *URINARY PROBLEMS  *BOWEL PROBLEMS  UNUSUAL RASH Items with * indicate a potential emergency and should be followed up as soon as possible.  Feel free to call the clinic should you have any questions or concerns. The clinic phone number is [phone].  Please show the CHEMO ALERT CARD at check-in to the Emergency Department and triage nurse.  Paclitaxel injection What is this medicine? PACLITAXEL (PAK li TAX el) is a chemotherapy drug. It targets fast dividing cells, like cancer cells, and causes these cells to die. This medicine is used to treat ovarian cancer, breast cancer, lung cancer, Kaposi's sarcoma, and other cancers. This medicine may be used for other purposes; ask your health care provider or pharmacist if you have questions. COMMON BRAND NAME(S): Onxol, Taxol What should I tell my health care provider before I take this medicine? They need to know if you have any of these conditions:  history of irregular heartbeat  liver disease  low blood counts, like low white cell, platelet, or red cell counts  lung or breathing disease, like asthma  tingling of the fingers or toes, or other nerve disorder  an unusual or allergic reaction to  paclitaxel, alcohol, polyoxyethylated castor oil, other chemotherapy, other medicines, foods, dyes, or preservatives  pregnant or trying to get pregnant  breast-feeding How should I use this medicine? This drug is given as an infusion into a vein. It is administered in a hospital or clinic by a specially trained health care professional. Talk to your pediatrician regarding the use of this medicine in children. Special care may be needed. Overdosage: If you think you have taken too much of this medicine contact a poison control center or emergency room at once. NOTE: This medicine is only for you. Do not share this medicine with others. What if I miss a dose? It is important not to miss your dose. Call your doctor or health care professional if you are unable to keep an appointment. What may interact with this medicine? Do not take this medicine with any of the following medications:  disulfiram  metronidazole This medicine may also interact with the following medications:  antiviral medicines for hepatitis, HIV or AIDS  certain antibiotics like erythromycin and clarithromycin  certain medicines for fungal infections like ketoconazole and itraconazole  certain medicines for seizures like carbamazepine, phenobarbital, phenytoin  gemfibrozil  nefazodone  rifampin  St. John's wort This list may not describe all possible interactions. Give your health care provider a list of all the medicines, herbs, non-prescription drugs, or dietary supplements you use. Also tell them if you smoke, drink alcohol, or use illegal drugs. Some items may interact with your medicine. What should I watch for while using this medicine? Your condition will be monitored carefully while you are receiving this medicine. You will   need important blood work done while you are taking this medicine. This medicine can cause serious allergic reactions. To reduce your risk you will need to take other medicine(s)  before treatment with this medicine. If you experience allergic reactions like skin rash, itching or hives, swelling of the face, lips, or tongue, tell your doctor or health care professional right away. In some cases, you may be given additional medicines to help with side effects. Follow all directions for their use. This drug may make you feel generally unwell. This is not uncommon, as chemotherapy can affect healthy cells as well as cancer cells. Report any side effects. Continue your course of treatment even though you feel ill unless your doctor tells you to stop. Call your doctor or health care professional for advice if you get a fever, chills or sore throat, or other symptoms of a cold or flu. Do not treat yourself. This drug decreases your body's ability to fight infections. Try to avoid being around people who are sick. This medicine may increase your risk to bruise or bleed. Call your doctor or health care professional if you notice any unusual bleeding. Be careful brushing and flossing your teeth or using a toothpick because you may get an infection or bleed more easily. If you have any dental work done, tell your dentist you are receiving this medicine. Avoid taking products that contain aspirin, acetaminophen, ibuprofen, naproxen, or ketoprofen unless instructed by your doctor. These medicines may hide a fever. Do not become pregnant while taking this medicine. Women should inform their doctor if they wish to become pregnant or think they might be pregnant. There is a potential for serious side effects to an unborn child. Talk to your health care professional or pharmacist for more information. Do not breast-feed an infant while taking this medicine. Men are advised not to father a child while receiving this medicine. This product may contain alcohol. Ask your pharmacist or healthcare provider if this medicine contains alcohol. Be sure to tell all healthcare providers you are taking this  medicine. Certain medicines, like metronidazole and disulfiram, can cause an unpleasant reaction when taken with alcohol. The reaction includes flushing, headache, nausea, vomiting, sweating, and increased thirst. The reaction can last from 30 minutes to several hours. What side effects may I notice from receiving this medicine? Side effects that you should report to your doctor or health care professional as soon as possible:  allergic reactions like skin rash, itching or hives, swelling of the face, lips, or tongue  breathing problems  changes in vision  fast, irregular heartbeat  high or low blood pressure  mouth sores  pain, tingling, numbness in the hands or feet  signs of decreased platelets or bleeding - bruising, pinpoint red spots on the skin, black, tarry stools, blood in the urine  signs of decreased red blood cells - unusually weak or tired, feeling faint or lightheaded, falls  signs of infection - fever or chills, cough, sore throat, pain or difficulty passing urine  signs and symptoms of liver injury like dark yellow or brown urine; general ill feeling or flu-like symptoms; light-colored stools; loss of appetite; nausea; right upper belly pain; unusually weak or tired; yellowing of the eyes or skin  swelling of the ankles, feet, hands  unusually slow heartbeat Side effects that usually do not require medical attention (report to your doctor or health care professional if they continue or are bothersome):  diarrhea  hair loss  loss of appetite  muscle   or joint pain  nausea, vomiting  pain, redness, or irritation at site where injected  tiredness This list may not describe all possible side effects. Call your doctor for medical advice about side effects. You may report side effects to FDA at 1-800-FDA-1088. Where should I keep my medicine? This drug is given in a hospital or clinic and will not be stored at home. NOTE: This sheet is a summary. It may not  cover all possible information. If you have questions about this medicine, talk to your doctor, pharmacist, or health care provider.  2020 Elsevier/Gold Standard (2016-09-03 13:14:55)  Carboplatin injection What is this medicine? CARBOPLATIN (KAR boe pla tin) is a chemotherapy drug. It targets fast dividing cells, like cancer cells, and causes these cells to die. This medicine is used to treat ovarian cancer and many other cancers. This medicine may be used for other purposes; ask your health care provider or pharmacist if you have questions. COMMON BRAND NAME(S): Paraplatin What should I tell my health care provider before I take this medicine? They need to know if you have any of these conditions:  blood disorders  hearing problems  kidney disease  recent or ongoing radiation therapy  an unusual or allergic reaction to carboplatin, cisplatin, other chemotherapy, other medicines, foods, dyes, or preservatives  pregnant or trying to get pregnant  breast-feeding How should I use this medicine? This drug is usually given as an infusion into a vein. It is administered in a hospital or clinic by a specially trained health care professional. Talk to your pediatrician regarding the use of this medicine in children. Special care may be needed. Overdosage: If you think you have taken too much of this medicine contact a poison control center or emergency room at once. NOTE: This medicine is only for you. Do not share this medicine with others. What if I miss a dose? It is important not to miss a dose. Call your doctor or health care professional if you are unable to keep an appointment. What may interact with this medicine?  medicines for seizures  medicines to increase blood counts like filgrastim, pegfilgrastim, sargramostim  some antibiotics like amikacin, gentamicin, neomycin, streptomycin, tobramycin  vaccines Talk to your doctor or health care professional before taking any of  these medicines:  acetaminophen  aspirin  ibuprofen  ketoprofen  naproxen This list may not describe all possible interactions. Give your health care provider a list of all the medicines, herbs, non-prescription drugs, or dietary supplements you use. Also tell them if you smoke, drink alcohol, or use illegal drugs. Some items may interact with your medicine. What should I watch for while using this medicine? Your condition will be monitored carefully while you are receiving this medicine. You will need important blood work done while you are taking this medicine. This drug may make you feel generally unwell. This is not uncommon, as chemotherapy can affect healthy cells as well as cancer cells. Report any side effects. Continue your course of treatment even though you feel ill unless your doctor tells you to stop. In some cases, you may be given additional medicines to help with side effects. Follow all directions for their use. Call your doctor or health care professional for advice if you get a fever, chills or sore throat, or other symptoms of a cold or flu. Do not treat yourself. This drug decreases your body's ability to fight infections. Try to avoid being around people who are sick. This medicine may increase your risk   to bruise or bleed. Call your doctor or health care professional if you notice any unusual bleeding. Be careful brushing and flossing your teeth or using a toothpick because you may get an infection or bleed more easily. If you have any dental work done, tell your dentist you are receiving this medicine. Avoid taking products that contain aspirin, acetaminophen, ibuprofen, naproxen, or ketoprofen unless instructed by your doctor. These medicines may hide a fever. Do not become pregnant while taking this medicine. Women should inform their doctor if they wish to become pregnant or think they might be pregnant. There is a potential for serious side effects to an unborn child.  Talk to your health care professional or pharmacist for more information. Do not breast-feed an infant while taking this medicine. What side effects may I notice from receiving this medicine? Side effects that you should report to your doctor or health care professional as soon as possible:  allergic reactions like skin rash, itching or hives, swelling of the face, lips, or tongue  signs of infection - fever or chills, cough, sore throat, pain or difficulty passing urine  signs of decreased platelets or bleeding - bruising, pinpoint red spots on the skin, black, tarry stools, nosebleeds  signs of decreased red blood cells - unusually weak or tired, fainting spells, lightheadedness  breathing problems  changes in hearing  changes in vision  chest pain  high blood pressure  low blood counts - This drug may decrease the number of white blood cells, red blood cells and platelets. You may be at increased risk for infections and bleeding.  nausea and vomiting  pain, swelling, redness or irritation at the injection site  pain, tingling, numbness in the hands or feet  problems with balance, talking, walking  trouble passing urine or change in the amount of urine Side effects that usually do not require medical attention (report to your doctor or health care professional if they continue or are bothersome):  hair loss  loss of appetite  metallic taste in the mouth or changes in taste This list may not describe all possible side effects. Call your doctor for medical advice about side effects. You may report side effects to FDA at 1-800-FDA-1088. Where should I keep my medicine? This drug is given in a hospital or clinic and will not be stored at home. NOTE: This sheet is a summary. It may not cover all possible information. If you have questions about this medicine, talk to your doctor, pharmacist, or health care provider.  2020 Elsevier/Gold Standard (2007-04-07 14:38:05)  

## 2019-10-21 NOTE — Progress Notes (Signed)
Per Dr. Alvy Bimler: okay to use labs from 10/13/2019. Also, okay to treat with Scr. of 3.17

## 2019-10-25 ENCOUNTER — Telehealth: Payer: Self-pay | Admitting: *Deleted

## 2019-10-28 ENCOUNTER — Other Ambulatory Visit (HOSPITAL_COMMUNITY): Payer: Medicare Other

## 2019-10-28 ENCOUNTER — Encounter (HOSPITAL_COMMUNITY): Payer: Self-pay | Admitting: Physician Assistant

## 2019-10-28 ENCOUNTER — Encounter: Payer: Self-pay | Admitting: Cardiology

## 2019-10-28 DIAGNOSIS — D630 Anemia in neoplastic disease: Secondary | ICD-10-CM | POA: Diagnosis not present

## 2019-10-28 DIAGNOSIS — C541 Malignant neoplasm of endometrium: Secondary | ICD-10-CM | POA: Diagnosis not present

## 2019-10-28 DIAGNOSIS — Z48816 Encounter for surgical aftercare following surgery on the genitourinary system: Secondary | ICD-10-CM | POA: Diagnosis not present

## 2019-10-28 DIAGNOSIS — N184 Chronic kidney disease, stage 4 (severe): Secondary | ICD-10-CM | POA: Diagnosis not present

## 2019-10-28 DIAGNOSIS — I129 Hypertensive chronic kidney disease with stage 1 through stage 4 chronic kidney disease, or unspecified chronic kidney disease: Secondary | ICD-10-CM | POA: Diagnosis not present

## 2019-10-28 DIAGNOSIS — E039 Hypothyroidism, unspecified: Secondary | ICD-10-CM | POA: Diagnosis not present

## 2019-10-28 NOTE — Progress Notes (Unsigned)
Patient ID: Regina Porter, female   DOB: 1934-02-05, 85 y.o.   MRN: [id number] Verified appointment "no show" status with Caryl Pina at 12:58pm.

## 2019-10-29 ENCOUNTER — Other Ambulatory Visit: Payer: Self-pay | Admitting: Nurse Practitioner

## 2019-10-29 DIAGNOSIS — E039 Hypothyroidism, unspecified: Secondary | ICD-10-CM | POA: Diagnosis not present

## 2019-10-29 DIAGNOSIS — D630 Anemia in neoplastic disease: Secondary | ICD-10-CM | POA: Diagnosis not present

## 2019-10-29 DIAGNOSIS — N184 Chronic kidney disease, stage 4 (severe): Secondary | ICD-10-CM | POA: Diagnosis not present

## 2019-10-29 DIAGNOSIS — G3184 Mild cognitive impairment, so stated: Secondary | ICD-10-CM

## 2019-10-29 DIAGNOSIS — C541 Malignant neoplasm of endometrium: Secondary | ICD-10-CM | POA: Diagnosis not present

## 2019-10-29 DIAGNOSIS — I129 Hypertensive chronic kidney disease with stage 1 through stage 4 chronic kidney disease, or unspecified chronic kidney disease: Secondary | ICD-10-CM | POA: Diagnosis not present

## 2019-10-29 DIAGNOSIS — Z48816 Encounter for surgical aftercare following surgery on the genitourinary system: Secondary | ICD-10-CM | POA: Diagnosis not present

## 2019-11-03 ENCOUNTER — Other Ambulatory Visit: Payer: Medicare Other

## 2019-11-03 ENCOUNTER — Ambulatory Visit: Payer: Medicare Other

## 2019-11-03 ENCOUNTER — Ambulatory Visit: Payer: Medicare Other | Admitting: Hematology and Oncology

## 2019-11-03 DIAGNOSIS — Z9012 Acquired absence of left breast and nipple: Secondary | ICD-10-CM | POA: Diagnosis not present

## 2019-11-03 DIAGNOSIS — K219 Gastro-esophageal reflux disease without esophagitis: Secondary | ICD-10-CM | POA: Diagnosis not present

## 2019-11-03 DIAGNOSIS — G4700 Insomnia, unspecified: Secondary | ICD-10-CM | POA: Diagnosis not present

## 2019-11-03 DIAGNOSIS — Z87891 Personal history of nicotine dependence: Secondary | ICD-10-CM | POA: Diagnosis not present

## 2019-11-03 DIAGNOSIS — N184 Chronic kidney disease, stage 4 (severe): Secondary | ICD-10-CM | POA: Diagnosis not present

## 2019-11-03 DIAGNOSIS — C541 Malignant neoplasm of endometrium: Secondary | ICD-10-CM | POA: Diagnosis not present

## 2019-11-03 DIAGNOSIS — F0280 Dementia in other diseases classified elsewhere without behavioral disturbance: Secondary | ICD-10-CM | POA: Diagnosis not present

## 2019-11-03 DIAGNOSIS — I129 Hypertensive chronic kidney disease with stage 1 through stage 4 chronic kidney disease, or unspecified chronic kidney disease: Secondary | ICD-10-CM | POA: Diagnosis not present

## 2019-11-03 DIAGNOSIS — F329 Major depressive disorder, single episode, unspecified: Secondary | ICD-10-CM | POA: Diagnosis not present

## 2019-11-03 DIAGNOSIS — Z90710 Acquired absence of both cervix and uterus: Secondary | ICD-10-CM | POA: Diagnosis not present

## 2019-11-03 DIAGNOSIS — E039 Hypothyroidism, unspecified: Secondary | ICD-10-CM | POA: Diagnosis not present

## 2019-11-03 DIAGNOSIS — Z48816 Encounter for surgical aftercare following surgery on the genitourinary system: Secondary | ICD-10-CM | POA: Diagnosis not present

## 2019-11-03 DIAGNOSIS — D630 Anemia in neoplastic disease: Secondary | ICD-10-CM | POA: Diagnosis not present

## 2019-11-03 DIAGNOSIS — Z853 Personal history of malignant neoplasm of breast: Secondary | ICD-10-CM | POA: Diagnosis not present

## 2019-11-03 DIAGNOSIS — M1990 Unspecified osteoarthritis, unspecified site: Secondary | ICD-10-CM | POA: Diagnosis not present

## 2019-11-03 DIAGNOSIS — G2581 Restless legs syndrome: Secondary | ICD-10-CM | POA: Diagnosis not present

## 2019-11-04 ENCOUNTER — Other Ambulatory Visit: Payer: Medicare Other

## 2019-11-04 ENCOUNTER — Ambulatory Visit: Payer: Medicare Other

## 2019-11-04 ENCOUNTER — Ambulatory Visit: Payer: Medicare Other | Admitting: Hematology and Oncology

## 2019-11-04 DIAGNOSIS — N184 Chronic kidney disease, stage 4 (severe): Secondary | ICD-10-CM | POA: Diagnosis not present

## 2019-11-04 DIAGNOSIS — Z48816 Encounter for surgical aftercare following surgery on the genitourinary system: Secondary | ICD-10-CM | POA: Diagnosis not present

## 2019-11-04 DIAGNOSIS — E039 Hypothyroidism, unspecified: Secondary | ICD-10-CM | POA: Diagnosis not present

## 2019-11-04 DIAGNOSIS — D630 Anemia in neoplastic disease: Secondary | ICD-10-CM | POA: Diagnosis not present

## 2019-11-04 DIAGNOSIS — I129 Hypertensive chronic kidney disease with stage 1 through stage 4 chronic kidney disease, or unspecified chronic kidney disease: Secondary | ICD-10-CM | POA: Diagnosis not present

## 2019-11-04 DIAGNOSIS — C541 Malignant neoplasm of endometrium: Secondary | ICD-10-CM | POA: Diagnosis not present

## 2019-11-05 DIAGNOSIS — Z48816 Encounter for surgical aftercare following surgery on the genitourinary system: Secondary | ICD-10-CM | POA: Diagnosis not present

## 2019-11-05 DIAGNOSIS — C541 Malignant neoplasm of endometrium: Secondary | ICD-10-CM | POA: Diagnosis not present

## 2019-11-05 DIAGNOSIS — I129 Hypertensive chronic kidney disease with stage 1 through stage 4 chronic kidney disease, or unspecified chronic kidney disease: Secondary | ICD-10-CM | POA: Diagnosis not present

## 2019-11-05 DIAGNOSIS — N184 Chronic kidney disease, stage 4 (severe): Secondary | ICD-10-CM | POA: Diagnosis not present

## 2019-11-05 DIAGNOSIS — D630 Anemia in neoplastic disease: Secondary | ICD-10-CM | POA: Diagnosis not present

## 2019-11-05 DIAGNOSIS — E039 Hypothyroidism, unspecified: Secondary | ICD-10-CM | POA: Diagnosis not present

## 2019-11-08 ENCOUNTER — Telehealth (HOSPITAL_COMMUNITY): Payer: Self-pay | Admitting: Physician Assistant

## 2019-11-08 NOTE — Telephone Encounter (Signed)
Just an FYI. We have made several attempts to contact this patient including sending a letter to schedule or reschedule their echocardiogram. We will be removing the patient from the echo WQ.  pt NO SHOWED -MAILED LETTER [facility]W  Thank you

## 2019-11-11 ENCOUNTER — Inpatient Hospital Stay (HOSPITAL_BASED_OUTPATIENT_CLINIC_OR_DEPARTMENT_OTHER): Payer: Medicare Other | Admitting: Hematology and Oncology

## 2019-11-11 ENCOUNTER — Inpatient Hospital Stay: Payer: Medicare Other

## 2019-11-11 ENCOUNTER — Other Ambulatory Visit: Payer: Self-pay

## 2019-11-11 ENCOUNTER — Encounter: Payer: Self-pay | Admitting: Hematology and Oncology

## 2019-11-11 DIAGNOSIS — C541 Malignant neoplasm of endometrium: Secondary | ICD-10-CM

## 2019-11-11 DIAGNOSIS — Z5111 Encounter for antineoplastic chemotherapy: Secondary | ICD-10-CM | POA: Diagnosis not present

## 2019-11-11 DIAGNOSIS — K5909 Other constipation: Secondary | ICD-10-CM

## 2019-11-11 DIAGNOSIS — I10 Essential (primary) hypertension: Secondary | ICD-10-CM | POA: Diagnosis not present

## 2019-11-11 DIAGNOSIS — N184 Chronic kidney disease, stage 4 (severe): Secondary | ICD-10-CM | POA: Diagnosis not present

## 2019-11-11 DIAGNOSIS — D61818 Other pancytopenia: Secondary | ICD-10-CM | POA: Diagnosis not present

## 2019-11-11 LAB — CMP (CANCER CENTER ONLY)
ALT: 10 U/L (ref 0–44)
AST: 17 U/L (ref 15–41)
Albumin: 3.4 g/dL — ABNORMAL LOW (ref 3.5–5.0)
Alkaline Phosphatase: 37 U/L — ABNORMAL LOW (ref 38–126)
Anion gap: 9 (ref 5–15)
BUN: 32 mg/dL — ABNORMAL HIGH (ref 8–23)
CO2: 24 mmol/L (ref 22–32)
Calcium: 9.1 mg/dL (ref 8.9–10.3)
Chloride: 106 mmol/L (ref 98–111)
Creatinine: 2.05 mg/dL — ABNORMAL HIGH (ref 0.44–1.00)
GFR, Estimated: 24 mL/min — ABNORMAL LOW (ref >60)
Glucose, Bld: 96 mg/dL (ref 70–99)
Potassium: 3.9 mmol/L (ref 3.5–5.1)
Sodium: 139 mmol/L (ref 135–145)
Total Bilirubin: 0.2 mg/dL — ABNORMAL LOW (ref 0.3–1.2)
Total Protein: 7.2 g/dL (ref 6.5–8.1)

## 2019-11-11 LAB — CBC WITH DIFFERENTIAL (CANCER CENTER ONLY)
Abs Immature Granulocytes: 0.01 10*3/uL (ref 0.00–0.07)
Basophils Absolute: 0 10*3/uL (ref 0.0–0.1)
Basophils Relative: 0 %
Eosinophils Absolute: 0 10*3/uL (ref 0.0–0.5)
Eosinophils Relative: 0 %
HCT: 28.6 % — ABNORMAL LOW (ref 36.0–46.0)
Hemoglobin: 9.4 g/dL — ABNORMAL LOW (ref 12.0–15.0)
Immature Granulocytes: 0 %
Lymphocytes Relative: 39 %
Lymphs Abs: 1.2 10*3/uL (ref 0.7–4.0)
MCH: 30 pg (ref 26.0–34.0)
MCHC: 32.9 g/dL (ref 30.0–36.0)
MCV: 91.4 fL (ref 80.0–100.0)
Monocytes Absolute: 0.3 10*3/uL (ref 0.1–1.0)
Monocytes Relative: 10 %
Neutro Abs: 1.6 10*3/uL — ABNORMAL LOW (ref 1.7–7.7)
Neutrophils Relative %: 51 %
Platelet Count: 215 10*3/uL (ref 150–400)
RBC: 3.13 MIL/uL — ABNORMAL LOW (ref 3.87–5.11)
RDW: 15.4 % (ref 11.5–15.5)
WBC Count: 3.2 10*3/uL — ABNORMAL LOW (ref 4.0–10.5)
nRBC: 0 % (ref 0.0–0.2)

## 2019-11-11 MED ORDER — PALONOSETRON HCL INJECTION 0.25 MG/5ML
0.2500 mg | Freq: Once | INTRAVENOUS | Status: AC
  Administered 2019-11-11: 0.25 mg via INTRAVENOUS

## 2019-11-11 MED ORDER — FAMOTIDINE IN NACL 20-0.9 MG/50ML-% IV SOLN
INTRAVENOUS | Status: AC
  Filled 2019-11-11: qty 50

## 2019-11-11 MED ORDER — PALONOSETRON HCL INJECTION 0.25 MG/5ML
INTRAVENOUS | Status: AC
  Filled 2019-11-11: qty 5

## 2019-11-11 MED ORDER — HEPARIN SOD (PORK) LOCK FLUSH 100 UNIT/ML IV SOLN
500.0000 [IU] | Freq: Once | INTRAVENOUS | Status: AC | PRN
  Administered 2019-11-11: 500 [IU]
  Filled 2019-11-11: qty 5

## 2019-11-11 MED ORDER — DIPHENHYDRAMINE HCL 50 MG/ML IJ SOLN
12.5000 mg | Freq: Once | INTRAMUSCULAR | Status: AC
  Administered 2019-11-11: 12.5 mg via INTRAVENOUS

## 2019-11-11 MED ORDER — SODIUM CHLORIDE 0.9 % IV SOLN
150.0000 mg | Freq: Once | INTRAVENOUS | Status: AC
  Administered 2019-11-11: 150 mg via INTRAVENOUS
  Filled 2019-11-11: qty 150

## 2019-11-11 MED ORDER — SODIUM CHLORIDE 0.9 % IV SOLN
250.0000 mg | Freq: Once | INTRAVENOUS | Status: AC
  Administered 2019-11-11: 250 mg via INTRAVENOUS
  Filled 2019-11-11: qty 25

## 2019-11-11 MED ORDER — SODIUM CHLORIDE 0.9 % IV SOLN
10.0000 mg | Freq: Once | INTRAVENOUS | Status: AC
  Administered 2019-11-11: 10 mg via INTRAVENOUS
  Filled 2019-11-11: qty 10

## 2019-11-11 MED ORDER — SODIUM CHLORIDE 0.9 % IV SOLN
Freq: Once | INTRAVENOUS | Status: AC
  Filled 2019-11-11: qty 250

## 2019-11-11 MED ORDER — SODIUM CHLORIDE 0.9 % IV SOLN
87.5000 mg/m2 | Freq: Once | INTRAVENOUS | Status: AC
  Administered 2019-11-11: 162 mg via INTRAVENOUS
  Filled 2019-11-11: qty 27

## 2019-11-11 MED ORDER — SODIUM CHLORIDE 0.9% FLUSH
10.0000 mL | INTRAVENOUS | Status: DC | PRN
  Administered 2019-11-11: 10 mL
  Filled 2019-11-11: qty 10

## 2019-11-11 MED ORDER — DIPHENHYDRAMINE HCL 50 MG/ML IJ SOLN
INTRAMUSCULAR | Status: AC
  Filled 2019-11-11: qty 1

## 2019-11-11 MED ORDER — FAMOTIDINE IN NACL 20-0.9 MG/50ML-% IV SOLN
20.0000 mg | Freq: Once | INTRAVENOUS | Status: AC
  Administered 2019-11-11: 20 mg via INTRAVENOUS

## 2019-11-11 NOTE — Assessment & Plan Note (Signed)
She has significant fluctuation of her blood pressure She will continue aggressive blood pressure management

## 2019-11-11 NOTE — Assessment & Plan Note (Signed)
She tolerated treatment fairly well, complicated by mild pancytopenia, severe constipation and mild weight loss Her kidney function fluctuate up and down I plan to adjust her chemotherapy based on her most current weight and her renal function We will proceed with treatment despite elevated serum creatinine

## 2019-11-11 NOTE — Assessment & Plan Note (Signed)
We discussed the importance of aggressive laxative therapy while on chemotherapy

## 2019-11-11 NOTE — Progress Notes (Signed)
Decrease Carboplatin dose to 250mg  (AUC 5) as patient received with her last cycle.  Hardie Pulley, PharmD, BCPS, BCOP

## 2019-11-11 NOTE — Assessment & Plan Note (Signed)
She has profound pancytopenia due to treatment We will continue moderate dose adjustment as before She is not symptomatic Observe closely

## 2019-11-11 NOTE — Assessment & Plan Note (Signed)
She has significant fluctuation of her renal function We will proceed with treatment as scheduled with dose adjustment She is instructed to increase oral fluid hydration as tolerated and aggressive risk factor management

## 2019-11-11 NOTE — Patient Instructions (Signed)
[facility] Discharge Instructions for Patients Receiving Chemotherapy  Today you received the following chemotherapy agents Paclitaxel (TAXOL) & Carboplatin (PARAPLATIN).  To help prevent nausea and vomiting after your treatment, we encourage you to take your nausea medication as prescribed.  If you develop nausea and vomiting that is not controlled by your nausea medication, call the clinic.   BELOW ARE SYMPTOMS THAT SHOULD BE REPORTED IMMEDIATELY:  *FEVER GREATER THAN 100.5 F  *CHILLS WITH OR WITHOUT FEVER  NAUSEA AND VOMITING THAT IS NOT CONTROLLED WITH YOUR NAUSEA MEDICATION  *UNUSUAL SHORTNESS OF BREATH  *UNUSUAL BRUISING OR BLEEDING  TENDERNESS IN MOUTH AND THROAT WITH OR WITHOUT PRESENCE OF ULCERS  *URINARY PROBLEMS  *BOWEL PROBLEMS  UNUSUAL RASH Items with * indicate a potential emergency and should be followed up as soon as possible.  Feel free to call the clinic should you have any questions or concerns. The clinic phone number is [phone].  Please show the CHEMO ALERT CARD at check-in to the Emergency Department and triage nurse.   

## 2019-11-11 NOTE — Progress Notes (Signed)
[facility] OFF[facility] PROGRESS NOTE  Patient Care Team: [name], NP as PCP - General (Geriatric Medicine) Jettie Booze, MD as PCP - Cardiology (Cardiology) Esperanza Heir, MD as Consulting Physician (Obstetrics and Gynecology) Consuella Lose, MD as Consulting Physician (Neurosurgery) Deterding, Jeneen Rinks, MD as Consulting Physician (Nephrology)  ASSESSMENT & PLAN:  Endometrial cancer [facility]) She tolerated treatment fairly well, complicated by mild pancytopenia, severe constipation and mild weight loss Her kidney function fluctuate up and down I plan to adjust her chemotherapy based on her most current weight and her renal function We will proceed with treatment despite elevated serum creatinine  Chronic kidney disease, stage 4 (severe) ([place]) She has significant fluctuation of her renal function We will proceed with treatment as scheduled with dose adjustment She is instructed to increase oral fluid hydration as tolerated and aggressive risk factor management   Pancytopenia, acquired ([place]) She has profound pancytopenia due to treatment We will continue moderate dose adjustment as before She is not symptomatic Observe closely  Hypertension She has significant fluctuation of her blood pressure She will continue aggressive blood pressure management  Other constipation We discussed the importance of aggressive laxative therapy while on chemotherapy   No orders of the defined types were placed in this encounter.   All questions were answered. The patient knows to call the clinic with any problems, questions or concerns. The total time spent in the appointment was 30 minutes encounter with patients including review of chart and various tests results, discussions about plan of care and coordination of care plan   Heath Lark, MD 11/11/2019 12:06 PM  INTERVAL HISTORY: Please see below for problem oriented charting. She returns for further  follow-up She has lost some weight Her appetite is poor She has severe chronic constipation worse while on treatment Denies peripheral neuropathy No recent nausea No recent infection, fever or chills  SUMMARY OF ONCOLOGIC HISTORY: Oncology History Overview Note  MMR IHC normal, MSI-stable HER2 negative (1+)   History of left breast cancer  Endometrial cancer ([place])  08/23/2019 Initial Biopsy   D&C A. ENDOMETRIUM, CURETTAGE:  -  High-grade carcinoma  -  See comment  COMMENT:  The biopsy consists of a high-grade carcinoma.  Based on the biopsy the differential diagnosis includes serous, high-grade endometrioid and undifferentiated carcinoma.  These results were discussed with Dr. Assunta Curtis nurse, Juliann Pulse, on August 24, 2019.    08/27/2019 Initial Diagnosis   Endometrial cancer ([place])   09/01/2019 Imaging   CT C/A/P: 1. Enlarged uterus and heterogeneous endometrial cavity, consistent with the given diagnosis of uterine/cervical cancer. Small to borderline enlarged pelvic retroperitoneal lymph nodes are highly worrisome for metastatic disease. 2. Borderline enlarged thoracic inlet and mediastinal lymph nodes also worrisome for malignancy. 3. Tiny subpleural nodules are likely benign subpleural lymph nodes. Recommend attention on follow-up. 4. Aortic atherosclerosis (ICD10-I70.0). Coronary artery calcification.   09/09/2019 Surgery   Robotic-assisted laparoscopic total hysterectomy with bilateral salpingoophorectomy, SLN biopsy, right pelvic and para-aortic lymphadenectomy  On EUA, bulky enlarged uterus, mobile. Normal upper abdominal survey although prominent gallbladder and some filmy adhesions between the liver and anterior abdominal wall. Normal appearing omentum, small and large bowel. Uterus quite bulky and 10-12cm. Normal appearing adnexa. Mapping successful to right obturator and left external iliac SLNs. Multiple obturator lymph nodes on the right, an external iliac lymph node on  the right, and lymph nodes along the common iliac and aorta on the right all enlarged, adherent to surround vessels, and filled with  tumor. All grossly involved lymph nodes removed.   09/09/2019 Pathology Results   A. SENTINEL LYMPH NODE, RIGHT OBTURATOR, ADJACENT ENLARGED LYMPH NODE,  EXCISION:  - Metastatic serous carcinoma, see comment   B. LYMPH NODE, RIGHT EXTERNAL ILIAC, BIOPSY:  - Benign lymph node, negative for carcinoma (0/1)   C. SENTINEL LYMPH NODE, LEFT EXTERNAL ILIAC, BIOPSY:  - Benign lymph node, negative for carcinoma (0/1)   D. 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 UTERUS, CERVIX, BILATERAL TUBE AND OVARIES:  - Invasive high-grade serous carcinoma, 8.2 cm  - Carcinoma invades for depth of 2.0 cm with a myometrial thickness is  2.2 cm  - Carcinoma invades into the cervical stroma  - Extensive lymphovascular invasion is present  - Benign unremarkable bilateral fallopian tubes and ovaries  - See oncology table   F. 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 LYMPH NODES, RIGHT COMMON AND PERIAORTIC, RESECTION:  - Metastatic serous carcinoma, see comment      COMMENT:   A.   Lymphoid tissue is not identified.  Findings likely represent an  entirely replaced lymph node(s).   G. Multiple fragments of lymph node(s) were received. The exact number  of lymph nodes cannot be determined but does not affect the nodal stage.     ONCOLOGY TABLE:   UTERUS, CARCINOMA OR CARCINOSARCOMA   Procedure: Total hysterectomy and bilateral salpingo-oophorectomy  Histologic type: Serous carcinoma  Histologic Grade: High-grade  Myometrial invasion:       Depth of invasion: 20 mm       Myometrial thickness: 22 mm  Uterine Serosa Involvement: Not identified  Cervical stromal involvement: Present  Extent of involvement of other organs: Not identified   Lymphovascular invasion: Present, extensive  Regional Lymph Nodes:        Examined:        At least 3 Sentinel                               At least 3 Non-sentinel                               At least 6 Total        Lymph nodes with metastasis: 4        Isolated tumor cells (<0.2 mm): 0        Micrometastasis: (>0.2 mm and < 2.0 mm): 0        Macrometastasis: (>2.0 mm): 4        Extracapsular extension: Present  Representative Tumor Block: E8  MMR / MSI testing: Will be ordered  Pathologic Stage Classification (pTNM, AJCC 8th edition):  pT2, pN2a    09/09/2019 Tumor Marker   Patient's tumor was tested for the following markers: CA-125. Results of the tumor marker test revealed: 181.   09/09/2019 Cancer Staging   Staging form: Corpus Uteri - Carcinoma and Carcinosarcoma, AJCC 8th Edition - Clinical stage from 09/09/2019: FIGO Stage IIIC2, calculated as Stage IVB (cT2, cN2a, pM1) - Signed by Lafonda Mosses, MD on 09/15/2019   10/11/2019 Procedure   Successful placement of a power injectable Port-A-Cath via the left internal jugular vein. The catheter is ready for immediate use. Prominent right cervical lymphadenopathy and occlusion of the right internal jugular  vein precluded right-sided placement.       10/21/2019 -  Chemotherapy   The patient had carboplatin and taxol for chemotherapy treatment.       REVIEW OF SYSTEMS:   Constitutional: Denies fevers, chills  Eyes: Denies blurriness of vision Ears, nose, mouth, throat, and face: Denies mucositis or sore throat Respiratory: Denies cough, dyspnea or wheezes Cardiovascular: Denies palpitation, chest discomfort or lower extremity swelling Skin: Denies abnormal skin rashes Lymphatics: Denies new lymphadenopathy or easy bruising Neurological:Denies numbness, tingling or new weaknesses Behavioral/Psych: Mood is stable, no new changes  All other systems were reviewed with the patient and are negative.  I have reviewed the  past medical history, past surgical history, social history and family history with the patient and they are unchanged from previous note.  ALLERGIES:  is allergic to shellfish allergy and neomy-bacit-polymyx-pramoxine.  MEDICATIONS:  Current Outpatient Medications  Medication Sig Dispense Refill  . acetaminophen (TYLENOL) 500 MG tablet Take 1,000 mg by mouth every 6 (six) hours as needed for moderate pain.     Marland Kitchen amLODipine (NORVASC) 10 MG tablet TAKE 1 TABLET BY MOUTH EVERY DAY 90 tablet 0  . aspirin 81 MG EC tablet Take 81 mg by mouth daily.     . bumetanide (BUMEX) 2 MG tablet Take 2 mg by mouth daily.  6  . busPIRone (BUSPAR) 10 MG tablet TAKE 1 TABLET BY MOUTH TWICE DAILY .DX F41.9 APPT IS OVERDUE 180 tablet 1  . Carboxymethylcellulose Sodium (EYE DROPS OP) Place 1 drop into both eyes daily as needed (dry eyes).     Marland Kitchen Cod Liver Oil 1000 MG CAPS Take 1,000 mg by mouth 2 (two) times daily.     Marland Kitchen dexamethasone (DECADRON) 4 MG tablet Take 2 tabs at the night before and 2 tab the morning of chemotherapy, every 3 weeks, by mouth x 6 cycles 36 tablet 6  . donepezil (ARICEPT) 5 MG tablet TAKE 1 TABLET BY MOUTH EVERYDAY AT BEDTIME DX: G31.84 90 tablet 0  . hydrALAZINE (APRESOLINE) 100 MG tablet TAKE 1 TABLET (100 MG TOTAL) BY MOUTH 3 (THREE) TIMES DAILY. 270 tablet 1  . levothyroxine (SYNTHROID) 75 MCG tablet Take one tablet by mouth once daily 30 minutes before breakfast for thyroid 90 tablet 0  . lidocaine-prilocaine (EMLA) cream Apply to affected area once 30 g 3  . losartan (COZAAR) 100 MG tablet TAKE 1 TABLET BY MOUTH EVERYDAY AT BEDTIME 90 tablet 1  . metoprolol succinate (TOPROL-XL) 100 MG 24 hr tablet Take 1 tablet (100 mg total) by mouth in the morning and at bedtime. Take with or immediately following a meal. 180 tablet 0  . Multiple Vitamin (MULTIVITAMIN ADULT PO) Take 1 tablet by mouth daily.    . ondansetron (ZOFRAN) 8 MG tablet Take 1 tablet (8 mg total) by mouth every 8 (eight) hours  as needed. Start on the third day after chemotherapy but only take if needed 30 tablet 1  . prochlorperazine (COMPAZINE) 10 MG tablet Take 1 tablet (10 mg total) by mouth every 6 (six) hours as needed (Nausea or vomiting). 30 tablet 1  . senna-docusate (SENOKOT-S) 8.6-50 MG tablet Take 2 tablets by mouth at bedtime. For AFTER surgery, do not take if having diarrhea 30 tablet 0  . traZODone (DESYREL) 50 MG tablet Take one tablet by mouth once daily at bedtime 90 tablet 0   No current facility-administered medications for this visit.    PHYSICAL EXAMINATION: ECOG PERFORMANCE STATUS: 2 - Symptomatic, <50% confined  to bed  Vitals:   11/11/19 1118  BP: (!) 168/62  Pulse: (!) 58  Resp: 20  Temp: 98.1 F (36.7 C)  SpO2: 100%   Filed Weights   11/11/19 1118  Weight: 164 lb (74.4 kg)    GENERAL:alert, no distress and comfortable SKIN: skin color, texture, turgor are normal, no rashes or significant lesions EYES: normal, Conjunctiva are pink and non-injected, sclera clear OROPHARYNX:no exudate, no erythema and lips, buccal mucosa, and tongue normal  NECK: supple, thyroid normal size, non-tender, without nodularity LYMPH:  no palpable lymphadenopathy in the cervical, axillary or inguinal LUNGS: clear to auscultation and percussion with normal breathing effort HEART: regular rate & rhythm and no murmurs and no lower extremity edema ABDOMEN:abdomen soft, non-tender and normal bowel sounds Musculoskeletal:no cyanosis of digits and no clubbing  NEURO: alert & oriented x 3 with fluent speech, no focal motor/sensory deficits  LABORATORY DATA:  I have reviewed the data as listed    Component Value Date/Time   NA 139 11/11/2019 1050   K 3.9 11/11/2019 1050   CL 106 11/11/2019 1050   CO2 24 11/11/2019 1050   GLUCOSE 96 11/11/2019 1050   BUN 32 (H) 11/11/2019 1050   CREATININE 2.05 (H) 11/11/2019 1050   CREATININE 1.78 (H) 09/29/2019 1100   CALCIUM 9.1 11/11/2019 1050   PROT 7.2  11/11/2019 1050   ALBUMIN 3.4 (L) 11/11/2019 1050   AST 17 11/11/2019 1050   ALT 10 11/11/2019 1050   ALKPHOS 37 (L) 11/11/2019 1050   BILITOT 0.2 (L) 11/11/2019 1050   GFRNONAA 24 (L) 11/11/2019 1050   GFRNONAA 26 (L) 09/29/2019 1100   GFRAA 15 (L) 10/13/2019 1312   GFRAA 30 (L) 09/29/2019 1100    No results found for: SPEP, UPEP  Lab Results  Component Value Date   WBC 3.2 (L) 11/11/2019   NEUTROABS 1.6 (L) 11/11/2019   HGB 9.4 (L) 11/11/2019   HCT 28.6 (L) 11/11/2019   MCV 91.4 11/11/2019   PLT 215 11/11/2019      Chemistry      Component Value Date/Time   NA 139 11/11/2019 1050   K 3.9 11/11/2019 1050   CL 106 11/11/2019 1050   CO2 24 11/11/2019 1050   BUN 32 (H) 11/11/2019 1050   CREATININE 2.05 (H) 11/11/2019 1050   CREATININE 1.78 (H) 09/29/2019 1100      Component Value Date/Time   CALCIUM 9.1 11/11/2019 1050   ALKPHOS 37 (L) 11/11/2019 1050   AST 17 11/11/2019 1050   ALT 10 11/11/2019 1050   BILITOT 0.2 (L) 11/11/2019 1050

## 2019-11-18 ENCOUNTER — Encounter: Payer: Self-pay | Admitting: Hematology and Oncology

## 2019-11-18 NOTE — Progress Notes (Signed)
Received proof of income left by daughter(Lacha) for [employer].  Advised will meet on 11/18 to obtain patient's signature and go over grant details. She verbalized understanding.  She has my card for any additional financial questions or concerns.

## 2019-11-22 ENCOUNTER — Telehealth: Payer: Self-pay

## 2019-11-22 DIAGNOSIS — D630 Anemia in neoplastic disease: Secondary | ICD-10-CM | POA: Diagnosis not present

## 2019-11-22 DIAGNOSIS — N184 Chronic kidney disease, stage 4 (severe): Secondary | ICD-10-CM | POA: Diagnosis not present

## 2019-11-22 DIAGNOSIS — E039 Hypothyroidism, unspecified: Secondary | ICD-10-CM | POA: Diagnosis not present

## 2019-11-22 DIAGNOSIS — Z48816 Encounter for surgical aftercare following surgery on the genitourinary system: Secondary | ICD-10-CM | POA: Diagnosis not present

## 2019-11-22 DIAGNOSIS — I129 Hypertensive chronic kidney disease with stage 1 through stage 4 chronic kidney disease, or unspecified chronic kidney disease: Secondary | ICD-10-CM | POA: Diagnosis not present

## 2019-11-22 DIAGNOSIS — C541 Malignant neoplasm of endometrium: Secondary | ICD-10-CM | POA: Diagnosis not present

## 2019-11-22 NOTE — Telephone Encounter (Signed)
Daughter called and left a message to call her.  Called back. She is asking if it would be okay for her Mom to fly after next treatment. She is going to leave on 11/20 going to [place] to visit with family. She will return prior to the 12/9 treatment. After she finishes all of her treatments Ms. Simko will be moving to [place] and transferring her care.

## 2019-11-23 DIAGNOSIS — E039 Hypothyroidism, unspecified: Secondary | ICD-10-CM | POA: Diagnosis not present

## 2019-11-23 DIAGNOSIS — N184 Chronic kidney disease, stage 4 (severe): Secondary | ICD-10-CM | POA: Diagnosis not present

## 2019-11-23 DIAGNOSIS — I129 Hypertensive chronic kidney disease with stage 1 through stage 4 chronic kidney disease, or unspecified chronic kidney disease: Secondary | ICD-10-CM | POA: Diagnosis not present

## 2019-11-23 DIAGNOSIS — C541 Malignant neoplasm of endometrium: Secondary | ICD-10-CM | POA: Diagnosis not present

## 2019-11-23 DIAGNOSIS — D630 Anemia in neoplastic disease: Secondary | ICD-10-CM | POA: Diagnosis not present

## 2019-11-23 DIAGNOSIS — Z48816 Encounter for surgical aftercare following surgery on the genitourinary system: Secondary | ICD-10-CM | POA: Diagnosis not present

## 2019-11-23 NOTE — Telephone Encounter (Signed)
Called and given below message to daughter. She verbalized understanding. 

## 2019-11-23 NOTE — Telephone Encounter (Signed)
She has many co-morbidities and is at risk for infection Practice neutropenic precaution to the best of her ability

## 2019-11-26 DIAGNOSIS — N184 Chronic kidney disease, stage 4 (severe): Secondary | ICD-10-CM | POA: Diagnosis not present

## 2019-11-26 DIAGNOSIS — D630 Anemia in neoplastic disease: Secondary | ICD-10-CM | POA: Diagnosis not present

## 2019-11-26 DIAGNOSIS — Z48816 Encounter for surgical aftercare following surgery on the genitourinary system: Secondary | ICD-10-CM | POA: Diagnosis not present

## 2019-11-26 DIAGNOSIS — C541 Malignant neoplasm of endometrium: Secondary | ICD-10-CM | POA: Diagnosis not present

## 2019-11-26 DIAGNOSIS — E039 Hypothyroidism, unspecified: Secondary | ICD-10-CM | POA: Diagnosis not present

## 2019-11-26 DIAGNOSIS — I129 Hypertensive chronic kidney disease with stage 1 through stage 4 chronic kidney disease, or unspecified chronic kidney disease: Secondary | ICD-10-CM | POA: Diagnosis not present

## 2019-11-29 ENCOUNTER — Telehealth: Payer: Self-pay

## 2019-11-29 DIAGNOSIS — N184 Chronic kidney disease, stage 4 (severe): Secondary | ICD-10-CM | POA: Diagnosis not present

## 2019-11-29 DIAGNOSIS — Z48816 Encounter for surgical aftercare following surgery on the genitourinary system: Secondary | ICD-10-CM | POA: Diagnosis not present

## 2019-11-29 DIAGNOSIS — D630 Anemia in neoplastic disease: Secondary | ICD-10-CM | POA: Diagnosis not present

## 2019-11-29 DIAGNOSIS — I129 Hypertensive chronic kidney disease with stage 1 through stage 4 chronic kidney disease, or unspecified chronic kidney disease: Secondary | ICD-10-CM | POA: Diagnosis not present

## 2019-11-29 DIAGNOSIS — E039 Hypothyroidism, unspecified: Secondary | ICD-10-CM | POA: Diagnosis not present

## 2019-11-29 DIAGNOSIS — C541 Malignant neoplasm of endometrium: Secondary | ICD-10-CM | POA: Diagnosis not present

## 2019-11-29 NOTE — Telephone Encounter (Signed)
OK, keep everything as is for now I can cancel next month's appt after I see her

## 2019-11-29 NOTE — Telephone Encounter (Signed)
Daughter called and left a message to call her.  Her Mom will get treatment on 11/18 and that will be her last treat at [facility]. Her son will come to appt on 11/18. [name] is moving to [facility] to be with her son. [name] is working on getting new her care transferred to [facility] in [place]. She will call back with MD name and number to fax medical records.

## 2019-12-02 ENCOUNTER — Inpatient Hospital Stay (HOSPITAL_BASED_OUTPATIENT_CLINIC_OR_DEPARTMENT_OTHER): Payer: Medicare Other | Admitting: Hematology and Oncology

## 2019-12-02 ENCOUNTER — Encounter: Payer: Self-pay | Admitting: Hematology and Oncology

## 2019-12-02 ENCOUNTER — Other Ambulatory Visit: Payer: Self-pay

## 2019-12-02 ENCOUNTER — Other Ambulatory Visit: Payer: Self-pay | Admitting: Hematology and Oncology

## 2019-12-02 ENCOUNTER — Inpatient Hospital Stay: Payer: Medicare Other | Attending: Hematology and Oncology

## 2019-12-02 ENCOUNTER — Inpatient Hospital Stay: Payer: Medicare Other

## 2019-12-02 DIAGNOSIS — Z5111 Encounter for antineoplastic chemotherapy: Secondary | ICD-10-CM | POA: Insufficient documentation

## 2019-12-02 DIAGNOSIS — N184 Chronic kidney disease, stage 4 (severe): Secondary | ICD-10-CM | POA: Diagnosis not present

## 2019-12-02 DIAGNOSIS — C541 Malignant neoplasm of endometrium: Secondary | ICD-10-CM

## 2019-12-02 DIAGNOSIS — K5909 Other constipation: Secondary | ICD-10-CM | POA: Diagnosis not present

## 2019-12-02 DIAGNOSIS — D61818 Other pancytopenia: Secondary | ICD-10-CM

## 2019-12-02 DIAGNOSIS — I10 Essential (primary) hypertension: Secondary | ICD-10-CM

## 2019-12-02 LAB — CMP (CANCER CENTER ONLY)
ALT: 9 U/L (ref 0–44)
AST: 16 U/L (ref 15–41)
Albumin: 3.4 g/dL — ABNORMAL LOW (ref 3.5–5.0)
Alkaline Phosphatase: 36 U/L — ABNORMAL LOW (ref 38–126)
Anion gap: 9 (ref 5–15)
BUN: 25 mg/dL — ABNORMAL HIGH (ref 8–23)
CO2: 25 mmol/L (ref 22–32)
Calcium: 8.8 mg/dL — ABNORMAL LOW (ref 8.9–10.3)
Chloride: 105 mmol/L (ref 98–111)
Creatinine: 2.03 mg/dL — ABNORMAL HIGH (ref 0.44–1.00)
GFR, Estimated: 24 mL/min — ABNORMAL LOW (ref >60)
Glucose, Bld: 97 mg/dL (ref 70–99)
Potassium: 3.6 mmol/L (ref 3.5–5.1)
Sodium: 139 mmol/L (ref 135–145)
Total Bilirubin: 0.3 mg/dL (ref 0.3–1.2)
Total Protein: 7.1 g/dL (ref 6.5–8.1)

## 2019-12-02 LAB — CBC WITH DIFFERENTIAL (CANCER CENTER ONLY)
Abs Immature Granulocytes: 0 10*3/uL (ref 0.00–0.07)
Basophils Absolute: 0 10*3/uL (ref 0.0–0.1)
Basophils Relative: 0 %
Eosinophils Absolute: 0 10*3/uL (ref 0.0–0.5)
Eosinophils Relative: 0 %
HCT: 26.1 % — ABNORMAL LOW (ref 36.0–46.0)
Hemoglobin: 9 g/dL — ABNORMAL LOW (ref 12.0–15.0)
Immature Granulocytes: 0 %
Lymphocytes Relative: 36 %
Lymphs Abs: 1 10*3/uL (ref 0.7–4.0)
MCH: 31.4 pg (ref 26.0–34.0)
MCHC: 34.5 g/dL (ref 30.0–36.0)
MCV: 90.9 fL (ref 80.0–100.0)
Monocytes Absolute: 0.4 10*3/uL (ref 0.1–1.0)
Monocytes Relative: 12 %
Neutro Abs: 1.5 10*3/uL — ABNORMAL LOW (ref 1.7–7.7)
Neutrophils Relative %: 52 %
Platelet Count: 146 10*3/uL — ABNORMAL LOW (ref 150–400)
RBC: 2.87 MIL/uL — ABNORMAL LOW (ref 3.87–5.11)
RDW: 16.5 % — ABNORMAL HIGH (ref 11.5–15.5)
WBC Count: 2.9 10*3/uL — ABNORMAL LOW (ref 4.0–10.5)
nRBC: 0 % (ref 0.0–0.2)

## 2019-12-02 MED ORDER — PALONOSETRON HCL INJECTION 0.25 MG/5ML
INTRAVENOUS | Status: AC
  Filled 2019-12-02: qty 5

## 2019-12-02 MED ORDER — SODIUM CHLORIDE 0.9 % IV SOLN
250.0000 mg | Freq: Once | INTRAVENOUS | Status: AC
  Administered 2019-12-02: 250 mg via INTRAVENOUS
  Filled 2019-12-02: qty 25

## 2019-12-02 MED ORDER — FAMOTIDINE IN NACL 20-0.9 MG/50ML-% IV SOLN
20.0000 mg | Freq: Once | INTRAVENOUS | Status: AC
  Administered 2019-12-02: 20 mg via INTRAVENOUS

## 2019-12-02 MED ORDER — SODIUM CHLORIDE 0.9% FLUSH
10.0000 mL | INTRAVENOUS | Status: DC | PRN
  Administered 2019-12-02: 10 mL
  Filled 2019-12-02: qty 10

## 2019-12-02 MED ORDER — PALONOSETRON HCL INJECTION 0.25 MG/5ML
0.2500 mg | Freq: Once | INTRAVENOUS | Status: AC
  Administered 2019-12-02: 0.25 mg via INTRAVENOUS

## 2019-12-02 MED ORDER — FAMOTIDINE IN NACL 20-0.9 MG/50ML-% IV SOLN
INTRAVENOUS | Status: AC
  Filled 2019-12-02: qty 50

## 2019-12-02 MED ORDER — DIPHENHYDRAMINE HCL 50 MG/ML IJ SOLN
12.5000 mg | Freq: Once | INTRAMUSCULAR | Status: AC
  Administered 2019-12-02: 12.5 mg via INTRAVENOUS

## 2019-12-02 MED ORDER — SODIUM CHLORIDE 0.9 % IV SOLN
10.0000 mg | Freq: Once | INTRAVENOUS | Status: AC
  Administered 2019-12-02: 10 mg via INTRAVENOUS
  Filled 2019-12-02: qty 10

## 2019-12-02 MED ORDER — SODIUM CHLORIDE 0.9 % IV SOLN
87.5000 mg/m2 | Freq: Once | INTRAVENOUS | Status: AC
  Administered 2019-12-02: 162 mg via INTRAVENOUS
  Filled 2019-12-02: qty 27

## 2019-12-02 MED ORDER — HEPARIN SOD (PORK) LOCK FLUSH 100 UNIT/ML IV SOLN
500.0000 [IU] | Freq: Once | INTRAVENOUS | Status: AC | PRN
  Administered 2019-12-02: 500 [IU]
  Filled 2019-12-02: qty 5

## 2019-12-02 MED ORDER — SODIUM CHLORIDE 0.9 % IV SOLN: 301.2000 mg | Freq: Once | INTRAVENOUS | Status: DC

## 2019-12-02 MED ORDER — SODIUM CHLORIDE 0.9 % IV SOLN
150.0000 mg | Freq: Once | INTRAVENOUS | Status: AC
  Administered 2019-12-02: 150 mg via INTRAVENOUS
  Filled 2019-12-02: qty 150

## 2019-12-02 MED ORDER — SODIUM CHLORIDE 0.9 % IV SOLN
Freq: Once | INTRAVENOUS | Status: AC
  Filled 2019-12-02: qty 250

## 2019-12-02 MED ORDER — DIPHENHYDRAMINE HCL 50 MG/ML IJ SOLN
INTRAMUSCULAR | Status: AC
  Filled 2019-12-02: qty 1

## 2019-12-02 NOTE — Assessment & Plan Note (Signed)
She has significant fluctuation of her blood pressure She will continue aggressive blood pressure management

## 2019-12-02 NOTE — Patient Instructions (Signed)
[facility] Discharge Instructions for Patients Receiving Chemotherapy  Today you received the following chemotherapy agents Paclitaxel (TAXOL) & Carboplatin (PARAPLATIN).  To help prevent nausea and vomiting after your treatment, we encourage you to take your nausea medication as prescribed.  If you develop nausea and vomiting that is not controlled by your nausea medication, call the clinic.   BELOW ARE SYMPTOMS THAT SHOULD BE REPORTED IMMEDIATELY:  *FEVER GREATER THAN 100.5 F  *CHILLS WITH OR WITHOUT FEVER  NAUSEA AND VOMITING THAT IS NOT CONTROLLED WITH YOUR NAUSEA MEDICATION  *UNUSUAL SHORTNESS OF BREATH  *UNUSUAL BRUISING OR BLEEDING  TENDERNESS IN MOUTH AND THROAT WITH OR WITHOUT PRESENCE OF ULCERS  *URINARY PROBLEMS  *BOWEL PROBLEMS  UNUSUAL RASH Items with * indicate a potential emergency and should be followed up as soon as possible.  Feel free to call the clinic should you have any questions or concerns. The clinic phone number is [phone].  Please show the CHEMO ALERT CARD at check-in to the Emergency Department and triage nurse.   

## 2019-12-02 NOTE — Assessment & Plan Note (Signed)
I reviewed her prior imaging study and plan of care with the patient and family Overall, she tolerated treatment fairly well except for pancytopenia and constipation Due to her poor social situation, the family is contemplating relocating the patient to [facility] However, given the complexity of her medical diagnosis, they are also leaning to with the possibility of her receiving all her treatment here before relocation I plan to call them on Monday for further follow-up In the meantime, we keep her appointment as scheduled

## 2019-12-02 NOTE — Progress Notes (Signed)
Spoke with Dr Alvy Bimler and she wants to keep carboplatin dose as before of 250 mg dose.  Dose modified to reflect this.  Per Dr Alvy Bimler she is actually more pacnytopenic. please keep at 250 mg   T.O. Dr Alvy Bimler, Henreitta Leber, PharmD

## 2019-12-02 NOTE — Progress Notes (Signed)
Met with patient at registration to introduce obtain signature for [employer].  Patient approved for one-time $1000 [employer] grant to assist with personal expenses while going through treatment. Discussed in detail with accompanying adult expenses and how they are covered. Gave them a copy of the expense sheet and approval letter along with the Outpatient pharmacy information. She received a gift card today from grant.  She has my card for any additional financial questions or concerns.

## 2019-12-02 NOTE — Patient Instructions (Signed)

## 2019-12-02 NOTE — Progress Notes (Signed)
[place] OFFICE PROGRESS NOTE  Patient Care Team: [name], NP as PCP - General (Geriatric Medicine) Jettie Booze, MD as PCP - Cardiology (Cardiology) Esperanza Heir, MD as Consulting Physician (Obstetrics and Gynecology) Consuella Lose, MD as Consulting Physician (Neurosurgery) Deterding, Jeneen Rinks, MD as Consulting Physician (Nephrology)  ASSESSMENT & PLAN:  Endometrial cancer [facility]) I reviewed her prior imaging study and plan of care with the patient and family Overall, she tolerated treatment fairly well except for pancytopenia and constipation Due to her poor social situation, the family is contemplating relocating the patient to [facility] However, given the complexity of her medical diagnosis, they are also leaning to with the possibility of her receiving all her treatment here before relocation I plan to call them on Monday for further follow-up In the meantime, we keep her appointment as scheduled  Chronic kidney disease, stage 4 (severe) ([place]) She has significant fluctuation of her renal function We will proceed with treatment as scheduled  She is instructed to increase oral fluid hydration as tolerated and aggressive risk factor management   Pancytopenia, acquired ([place]) She is not symptomatic The progressive pancytopenia is likely due to side effects of chemotherapy  Hypertension She has significant fluctuation of her blood pressure She will continue aggressive blood pressure management  Other constipation We discussed the importance of aggressive laxative therapy while on chemotherapy   No orders of the defined types were placed in this encounter.   All questions were answered. The patient knows to call the clinic with any problems, questions or concerns. The total time spent in the appointment was 40 minutes encounter with patients including review of chart and various tests results, discussions about plan of care and  coordination of care plan   Heath Lark, MD 12/02/2019 12:24 PM  INTERVAL HISTORY: Please see below for problem oriented charting. She is seen prior to cycle 3 of chemotherapy She is here accompanied by 2 children today Family is thinking about moving the patient to [place] this coming weekend The rationale behind the plan for relocation is because of better social support at home in [place] They are thinking about moving her to [facility] Since her last treatment, she feels fine No nausea She have chronic constipation, stable When I review her medication list, it appears that the patient has not been taking some of her medications correctly We are not aware of what her blood pressure control is at home Her family have numerous questions regarding her diagnosis and plan of care She denies recent new side effects from treatment Her appetite is fair and she has gained some weight No peripheral neuropathy  SUMMARY OF ONCOLOGIC HISTORY: Oncology History Overview Note  MMR IHC normal, MSI-stable HER2 negative (1+)   History of left breast cancer  Endometrial cancer ([place])  08/23/2019 Initial Biopsy   D&C A. ENDOMETRIUM, CURETTAGE:  -  High-grade carcinoma  -  See comment  COMMENT:  The biopsy consists of a high-grade carcinoma.  Based on the biopsy the differential diagnosis includes serous, high-grade endometrioid and undifferentiated carcinoma.  These results were discussed with Dr. Assunta Curtis nurse, Juliann Pulse, on August 24, 2019.    08/27/2019 Initial Diagnosis   Endometrial cancer ([place])   09/01/2019 Imaging   CT C/A/P: 1. Enlarged uterus and heterogeneous endometrial cavity, consistent with the given diagnosis of uterine/cervical cancer. Small to borderline enlarged pelvic retroperitoneal lymph nodes are highly worrisome for metastatic disease. 2. Borderline enlarged thoracic inlet and mediastinal lymph nodes also  worrisome for malignancy. 3. Tiny subpleural nodules are likely  benign subpleural lymph nodes. Recommend attention on follow-up. 4. Aortic atherosclerosis (ICD10-I70.0). Coronary artery calcification.   09/09/2019 Surgery   Robotic-assisted laparoscopic total hysterectomy with bilateral salpingoophorectomy, SLN biopsy, right pelvic and para-aortic lymphadenectomy  On EUA, bulky enlarged uterus, mobile. Normal upper abdominal survey although prominent gallbladder and some filmy adhesions between the liver and anterior abdominal wall. Normal appearing omentum, small and large bowel. Uterus quite bulky and 10-12cm. Normal appearing adnexa. Mapping successful to right obturator and left external iliac SLNs. Multiple obturator lymph nodes on the right, an external iliac lymph node on the right, and lymph nodes along the common iliac and aorta on the right all enlarged, adherent to surround vessels, and filled with tumor. All grossly involved lymph nodes removed.   09/09/2019 Pathology Results   A. SENTINEL LYMPH NODE, RIGHT OBTURATOR, ADJACENT ENLARGED LYMPH NODE,  EXCISION:  - Metastatic serous carcinoma, see comment   B. LYMPH NODE, RIGHT EXTERNAL ILIAC, BIOPSY:  - Benign lymph node, negative for carcinoma (0/1)   C. SENTINEL LYMPH NODE, LEFT EXTERNAL ILIAC, BIOPSY:  - Benign lymph node, negative for carcinoma (0/1)   D. 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 UTERUS, CERVIX, BILATERAL TUBE AND OVARIES:  - Invasive high-grade serous carcinoma, 8.2 cm  - Carcinoma invades for depth of 2.0 cm with a myometrial thickness is  2.2 cm  - Carcinoma invades into the cervical stroma  - Extensive lymphovascular invasion is present  - Benign unremarkable bilateral fallopian tubes and ovaries  - See oncology table   F. 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 LYMPH NODES, RIGHT COMMON AND PERIAORTIC, RESECTION:  - Metastatic serous  carcinoma, see comment      COMMENT:   A.   Lymphoid tissue is not identified.  Findings likely represent an  entirely replaced lymph node(s).   G. Multiple fragments of lymph node(s) were received. The exact number  of lymph nodes cannot be determined but does not affect the nodal stage.     ONCOLOGY TABLE:   UTERUS, CARCINOMA OR CARCINOSARCOMA   Procedure: Total hysterectomy and bilateral salpingo-oophorectomy  Histologic type: Serous carcinoma  Histologic Grade: High-grade  Myometrial invasion:       Depth of invasion: 20 mm       Myometrial thickness: 22 mm  Uterine Serosa Involvement: Not identified  Cervical stromal involvement: Present  Extent of involvement of other organs: Not identified  Lymphovascular invasion: Present, extensive  Regional Lymph Nodes:        Examined:        At least 3 Sentinel                               At least 3 Non-sentinel                               At least 6 Total        Lymph nodes with metastasis: 4        Isolated tumor cells (<0.2 mm): 0        Micrometastasis: (>0.2 mm and < 2.0 mm): 0        Macrometastasis: (>2.0 mm): 4  Extracapsular extension: Present  Representative Tumor Block: E8  MMR / MSI testing: Will be ordered  Pathologic Stage Classification (pTNM, AJCC 8th edition):  pT2, pN2a    09/09/2019 Tumor Marker   Patient's tumor was tested for the following markers: CA-125. Results of the tumor marker test revealed: 181.   09/09/2019 Cancer Staging   Staging form: Corpus Uteri - Carcinoma and Carcinosarcoma, AJCC 8th Edition - Clinical stage from 09/09/2019: FIGO Stage IIIC2, calculated as Stage IVB (cT2, cN2a, pM1) - Signed by Lafonda Mosses, MD on 09/15/2019   10/11/2019 Procedure   Successful placement of a power injectable Port-A-Cath via the left internal jugular vein. The catheter is ready for immediate use. Prominent right cervical lymphadenopathy and occlusion of the right internal jugular vein  precluded right-sided placement.       10/21/2019 -  Chemotherapy   The patient had carboplatin and taxol for chemotherapy treatment.       REVIEW OF SYSTEMS:   Constitutional: Denies fevers, chills or abnormal weight loss Eyes: Denies blurriness of vision Ears, nose, mouth, throat, and face: Denies mucositis or sore throat Respiratory: Denies cough, dyspnea or wheezes Cardiovascular: Denies palpitation, chest discomfort or lower extremity swelling Skin: Denies abnormal skin rashes Lymphatics: Denies new lymphadenopathy or easy bruising Neurological:Denies numbness, tingling or new weaknesses Behavioral/Psych: Mood is stable, no new changes  All other systems were reviewed with the patient and are negative.  I have reviewed the past medical history, past surgical history, social history and family history with the patient and they are unchanged from previous note.  ALLERGIES:  is allergic to shellfish allergy and neomy-bacit-polymyx-pramoxine.  MEDICATIONS:  Current Outpatient Medications  Medication Sig Dispense Refill  . acetaminophen (TYLENOL) 500 MG tablet Take 1,000 mg by mouth every 6 (six) hours as needed for moderate pain.     Marland Kitchen amLODipine (NORVASC) 10 MG tablet TAKE 1 TABLET BY MOUTH EVERY DAY 90 tablet 0  . aspirin 81 MG EC tablet Take 81 mg by mouth daily.     . bumetanide (BUMEX) 2 MG tablet Take 2 mg by mouth daily.  6  . busPIRone (BUSPAR) 10 MG tablet TAKE 1 TABLET BY MOUTH TWICE DAILY .DX F41.9 APPT IS OVERDUE 180 tablet 1  . Carboxymethylcellulose Sodium (EYE DROPS OP) Place 1 drop into both eyes daily as needed (dry eyes).     Marland Kitchen Cod Liver Oil 1000 MG CAPS Take 1,000 mg by mouth 2 (two) times daily.     Marland Kitchen dexamethasone (DECADRON) 4 MG tablet Take 2 tabs at the night before and 2 tab the morning of chemotherapy, every 3 weeks, by mouth x 6 cycles 36 tablet 6  . donepezil (ARICEPT) 5 MG tablet TAKE 1 TABLET BY MOUTH EVERYDAY AT BEDTIME DX: G31.84 90 tablet 0  .  hydrALAZINE (APRESOLINE) 100 MG tablet TAKE 1 TABLET (100 MG TOTAL) BY MOUTH 3 (THREE) TIMES DAILY. 270 tablet 1  . levothyroxine (SYNTHROID) 75 MCG tablet Take one tablet by mouth once daily 30 minutes before breakfast for thyroid 90 tablet 0  . lidocaine-prilocaine (EMLA) cream Apply to affected area once 30 g 3  . losartan (COZAAR) 100 MG tablet TAKE 1 TABLET BY MOUTH EVERYDAY AT BEDTIME 90 tablet 1  . metoprolol succinate (TOPROL-XL) 100 MG 24 hr tablet Take 1 tablet (100 mg total) by mouth in the morning and at bedtime. Take with or immediately following a meal. 180 tablet 0  . Multiple Vitamin (MULTIVITAMIN ADULT PO) Take 1 tablet by  mouth daily.    . ondansetron (ZOFRAN) 8 MG tablet Take 1 tablet (8 mg total) by mouth every 8 (eight) hours as needed. Start on the third day after chemotherapy but only take if needed 30 tablet 1  . prochlorperazine (COMPAZINE) 10 MG tablet Take 1 tablet (10 mg total) by mouth every 6 (six) hours as needed (Nausea or vomiting). 30 tablet 1  . senna-docusate (SENOKOT-S) 8.6-50 MG tablet Take 2 tablets by mouth at bedtime. For AFTER surgery, do not take if having diarrhea 30 tablet 0  . traZODone (DESYREL) 50 MG tablet Take one tablet by mouth once daily at bedtime 90 tablet 0   No current facility-administered medications for this visit.   Facility-Administered Medications Ordered in Other Visits  Medication Dose Route Frequency Provider Last Rate Last Admin  . 0.9 %  sodium chloride infusion   Intravenous Once Alvy Bimler, Kristy Schomburg, MD      . CARBOplatin (PARAPLATIN) 300 mg in sodium chloride 0.9 % 100 mL chemo infusion  300 mg Intravenous Once Alvy Bimler, Juma Oxley, MD      . dexamethasone (DECADRON) 10 mg in sodium chloride 0.9 % 50 mL IVPB  10 mg Intravenous Once Alvy Bimler, Elena Cothern, MD      . diphenhydrAMINE (BENADRYL) injection 12.5 mg  12.5 mg Intravenous Once Alvy Bimler, Mazen Marcin, MD      . famotidine (PEPCID) IVPB 20 mg premix  20 mg Intravenous Once Alvy Bimler, Jawanna Dykman, MD      . fosaprepitant  (EMEND) 150 mg in sodium chloride 0.9 % 145 mL IVPB  150 mg Intravenous Once Alvy Bimler, Rudene Poulsen, MD      . heparin lock flush 100 unit/mL  500 Units Intracatheter Once PRN Alvy Bimler, Shallen Luedke, MD      . PACLitaxel (TAXOL) 162 mg in sodium chloride 0.9 % 250 mL chemo infusion (> $RemoveBef'80mg'CwAutnUgPW$ /m2)  87.5 mg/m2 (Treatment Plan Recorded) Intravenous Once Alvy Bimler, Dorien Bessent, MD      . palonosetron (ALOXI) injection 0.25 mg  0.25 mg Intravenous Once Nakeisha Greenhouse, MD      . sodium chloride flush (NS) 0.9 % injection 10 mL  10 mL Intracatheter PRN Alvy Bimler, Keni Wafer, MD        PHYSICAL EXAMINATION: ECOG PERFORMANCE STATUS: 2 - Symptomatic, <50% confined to bed  Vitals:   12/02/19 1128  BP: (!) 185/53  Pulse: 66  Resp: 18  Temp: 97.6 F (36.4 C)  SpO2: 100%   Filed Weights   12/02/19 1128  Weight: 167 lb 6.4 oz (75.9 kg)    GENERAL:alert, no distress and comfortable NEURO: alert & oriented x 3 with fluent speech, no focal motor/sensory deficits  LABORATORY DATA:  I have reviewed the data as listed    Component Value Date/Time   NA 139 12/02/2019 1105   K 3.6 12/02/2019 1105   CL 105 12/02/2019 1105   CO2 25 12/02/2019 1105   GLUCOSE 97 12/02/2019 1105   BUN 25 (H) 12/02/2019 1105   CREATININE 2.03 (H) 12/02/2019 1105   CREATININE 1.78 (H) 09/29/2019 1100   CALCIUM 8.8 (L) 12/02/2019 1105   PROT 7.1 12/02/2019 1105   ALBUMIN 3.4 (L) 12/02/2019 1105   AST 16 12/02/2019 1105   ALT 9 12/02/2019 1105   ALKPHOS 36 (L) 12/02/2019 1105   BILITOT 0.3 12/02/2019 1105   GFRNONAA 24 (L) 12/02/2019 1105   GFRNONAA 26 (L) 09/29/2019 1100   GFRAA 15 (L) 10/13/2019 1312   GFRAA 30 (L) 09/29/2019 1100    No results found for: SPEP, UPEP  Lab  Results  Component Value Date   WBC 2.9 (L) 12/02/2019   NEUTROABS 1.5 (L) 12/02/2019   HGB 9.0 (L) 12/02/2019   HCT 26.1 (L) 12/02/2019   MCV 90.9 12/02/2019   PLT 146 (L) 12/02/2019      Chemistry      Component Value Date/Time   NA 139 12/02/2019 1105   K 3.6 12/02/2019  1105   CL 105 12/02/2019 1105   CO2 25 12/02/2019 1105   BUN 25 (H) 12/02/2019 1105   CREATININE 2.03 (H) 12/02/2019 1105   CREATININE 1.78 (H) 09/29/2019 1100      Component Value Date/Time   CALCIUM 8.8 (L) 12/02/2019 1105   ALKPHOS 36 (L) 12/02/2019 1105   AST 16 12/02/2019 1105   ALT 9 12/02/2019 1105   BILITOT 0.3 12/02/2019 1105

## 2019-12-02 NOTE — Progress Notes (Signed)
proceed with treatment mild pancytopenia, high BP and elevated creatinine per Dr Alvy Bimler

## 2019-12-02 NOTE — Assessment & Plan Note (Signed)
She is not symptomatic The progressive pancytopenia is likely due to side effects of chemotherapy

## 2019-12-02 NOTE — Assessment & Plan Note (Signed)
We discussed the importance of aggressive laxative therapy while on chemotherapy

## 2019-12-02 NOTE — Assessment & Plan Note (Signed)
She has significant fluctuation of her renal function We will proceed with treatment as scheduled  She is instructed to increase oral fluid hydration as tolerated and aggressive risk factor management

## 2019-12-06 ENCOUNTER — Telehealth: Payer: Self-pay | Admitting: Oncology

## 2019-12-06 NOTE — Telephone Encounter (Signed)
Called Bruce (son) regarding the family's decision to move Regina Porter to [place] for her treatment.  He said she is in [place] now and is wondering if we can help her to schedule a consult with the [facility] at the [place] location.  If they can get the consult and treatment before 12/9, they would like her to have treatment there.  If not, she will come back to [place].  He asked if they could get as early as possible appointments for 12/23/19 in [place].  He also said if they can get the treatment in [place] on 12/9, then they would like her to get the next treatments in [place].  Explained that it is not a good idea/may not be possible to switch treatment back and forth between facilities. He verbalized agreement and would like to go forward with getting her a consult as soon as possible with [facility].

## 2019-12-06 NOTE — Telephone Encounter (Signed)
Pls arrange for referral. I will keep her appt as scheduled on 12/9 but we will call him again first week of Dec for update

## 2019-12-06 NOTE — Telephone Encounter (Signed)
[place] and they requested referral and records to be faxed to [phone].  They will call to schedule an apt in 1-2 business days.  Faxed referral successfully.  Called Bruce and let him know the referral has been sent.  He also asked if Regina Porter's next infusion and appointments with [facility] could be moved to 12/30/19.  That would work better for them in case she would need to travel to [place].

## 2019-12-06 NOTE — Telephone Encounter (Signed)
There were too many variables Will hold off rescheduling anything until she has seen new MD in [facility]

## 2019-12-13 NOTE — Telephone Encounter (Signed)
[place] and they are waiting for the patient to call back with insurance information before she is scheduled.  Gave them patient's son's phone number to call as well.  They said as soon as they have the insurance info, she will be scheduled.  They did receive our referral paperwork.

## 2019-12-15 ENCOUNTER — Telehealth: Payer: Self-pay | Admitting: Oncology

## 2019-12-15 NOTE — Telephone Encounter (Signed)
Called Regina Porter to see if [facility] had scheduled an appointment yet.  He said he has not heard from them.  Gave him the phone number as they were trying to reach [name] regarding her insurance information.  Advised him to call back if they need any more information from [facility] and to see if her appointment for chemo needs to be rescheduled.

## 2019-12-16 ENCOUNTER — Telehealth: Payer: Self-pay | Admitting: Oncology

## 2019-12-16 NOTE — Telephone Encounter (Signed)
Bruce called and has talked to the [facility].  They have tentatively scheduled Khamila for an appointment on 01/18/20 but will move her up once the doctor has reviewed her medical records. Bruce is going to call them again this afternoon to check on a sooner appointment.  He said if she can't be seen before 12/9, they have decided to keep her treatment in [place].  He did ask to move her treatment and appointments on 12/23/19 to 12/30/19 so that Shaima can travel back to [place] and so they can arrange for family members to stay with her.

## 2019-12-16 NOTE — Telephone Encounter (Signed)
Pls move all her appt to the date he requested

## 2019-12-17 NOTE — Telephone Encounter (Signed)
Called Bruce and advised him of appointments on 12/15 and 12/16.  He verbalized agreement and again said that they would like to keep her treatments at [facility].  They were able to get a 12/28 appointment at [facility] but they are still reviewing her records. Advised him to call if they have any questions or needs.

## 2019-12-23 ENCOUNTER — Ambulatory Visit: Payer: Medicare Other

## 2019-12-23 ENCOUNTER — Ambulatory Visit: Payer: Medicare Other | Admitting: Hematology and Oncology

## 2019-12-23 ENCOUNTER — Other Ambulatory Visit: Payer: Medicare Other

## 2019-12-29 ENCOUNTER — Telehealth: Payer: Self-pay | Admitting: Hematology and Oncology

## 2019-12-29 ENCOUNTER — Inpatient Hospital Stay: Payer: Medicare Other | Attending: Hematology and Oncology

## 2019-12-29 ENCOUNTER — Other Ambulatory Visit: Payer: Self-pay

## 2019-12-29 ENCOUNTER — Inpatient Hospital Stay: Payer: Medicare Other

## 2019-12-29 ENCOUNTER — Inpatient Hospital Stay (HOSPITAL_BASED_OUTPATIENT_CLINIC_OR_DEPARTMENT_OTHER): Payer: Medicare Other | Admitting: Hematology and Oncology

## 2019-12-29 DIAGNOSIS — N184 Chronic kidney disease, stage 4 (severe): Secondary | ICD-10-CM | POA: Insufficient documentation

## 2019-12-29 DIAGNOSIS — D61818 Other pancytopenia: Secondary | ICD-10-CM | POA: Diagnosis not present

## 2019-12-29 DIAGNOSIS — C541 Malignant neoplasm of endometrium: Secondary | ICD-10-CM

## 2019-12-29 DIAGNOSIS — I10 Essential (primary) hypertension: Secondary | ICD-10-CM | POA: Diagnosis not present

## 2019-12-29 DIAGNOSIS — R634 Abnormal weight loss: Secondary | ICD-10-CM | POA: Insufficient documentation

## 2019-12-29 DIAGNOSIS — K5909 Other constipation: Secondary | ICD-10-CM

## 2019-12-29 DIAGNOSIS — C775 Secondary and unspecified malignant neoplasm of intrapelvic lymph nodes: Secondary | ICD-10-CM | POA: Diagnosis not present

## 2019-12-29 DIAGNOSIS — Z5111 Encounter for antineoplastic chemotherapy: Secondary | ICD-10-CM | POA: Insufficient documentation

## 2019-12-29 DIAGNOSIS — I129 Hypertensive chronic kidney disease with stage 1 through stage 4 chronic kidney disease, or unspecified chronic kidney disease: Secondary | ICD-10-CM | POA: Diagnosis not present

## 2019-12-29 LAB — CBC WITH DIFFERENTIAL (CANCER CENTER ONLY)
Abs Immature Granulocytes: 0.01 10*3/uL (ref 0.00–0.07)
Basophils Absolute: 0 10*3/uL (ref 0.0–0.1)
Basophils Relative: 0 %
Eosinophils Absolute: 0 10*3/uL (ref 0.0–0.5)
Eosinophils Relative: 0 %
HCT: 27.3 % — ABNORMAL LOW (ref 36.0–46.0)
Hemoglobin: 9.2 g/dL — ABNORMAL LOW (ref 12.0–15.0)
Immature Granulocytes: 0 %
Lymphocytes Relative: 45 %
Lymphs Abs: 1.2 10*3/uL (ref 0.7–4.0)
MCH: 31.2 pg (ref 26.0–34.0)
MCHC: 33.7 g/dL (ref 30.0–36.0)
MCV: 92.5 fL (ref 80.0–100.0)
Monocytes Absolute: 0.5 10*3/uL (ref 0.1–1.0)
Monocytes Relative: 16 %
Neutro Abs: 1.1 10*3/uL — ABNORMAL LOW (ref 1.7–7.7)
Neutrophils Relative %: 39 %
Platelet Count: 155 10*3/uL (ref 150–400)
RBC: 2.95 MIL/uL — ABNORMAL LOW (ref 3.87–5.11)
RDW: 17.4 % — ABNORMAL HIGH (ref 11.5–15.5)
WBC Count: 2.7 10*3/uL — ABNORMAL LOW (ref 4.0–10.5)
nRBC: 0 % (ref 0.0–0.2)

## 2019-12-29 LAB — CMP (CANCER CENTER ONLY)
ALT: 14 U/L (ref 0–44)
AST: 21 U/L (ref 15–41)
Albumin: 3.7 g/dL (ref 3.5–5.0)
Alkaline Phosphatase: 37 U/L — ABNORMAL LOW (ref 38–126)
Anion gap: 6 (ref 5–15)
BUN: 21 mg/dL (ref 8–23)
CO2: 28 mmol/L (ref 22–32)
Calcium: 9.1 mg/dL (ref 8.9–10.3)
Chloride: 107 mmol/L (ref 98–111)
Creatinine: 1.82 mg/dL — ABNORMAL HIGH (ref 0.44–1.00)
GFR, Estimated: 27 mL/min — ABNORMAL LOW (ref >60)
Glucose, Bld: 98 mg/dL (ref 70–99)
Potassium: 3.7 mmol/L (ref 3.5–5.1)
Sodium: 141 mmol/L (ref 135–145)
Total Bilirubin: 0.4 mg/dL (ref 0.3–1.2)
Total Protein: 7.6 g/dL (ref 6.5–8.1)

## 2019-12-29 MED ORDER — DEXAMETHASONE 4 MG PO TABS: ORAL_TABLET | ORAL | 6 refills | Status: DC

## 2019-12-29 MED ORDER — SODIUM CHLORIDE 0.9% FLUSH
10.0000 mL | Freq: Once | INTRAVENOUS | Status: AC
  Administered 2019-12-29: 10 mL
  Filled 2019-12-29: qty 10

## 2019-12-29 MED ORDER — HEPARIN SOD (PORK) LOCK FLUSH 100 UNIT/ML IV SOLN
500.0000 [IU] | Freq: Once | INTRAVENOUS | Status: AC
  Administered 2019-12-29: 500 [IU]
  Filled 2019-12-29: qty 5

## 2019-12-29 NOTE — Telephone Encounter (Signed)
Scheduled appts per 12/15 sch msg. Gave pt a print out of AVS.  

## 2019-12-30 ENCOUNTER — Telehealth: Payer: Self-pay | Admitting: Hematology and Oncology

## 2019-12-30 ENCOUNTER — Encounter: Payer: Self-pay | Admitting: Hematology and Oncology

## 2019-12-30 ENCOUNTER — Inpatient Hospital Stay: Payer: Medicare Other

## 2019-12-30 ENCOUNTER — Other Ambulatory Visit: Payer: Self-pay

## 2019-12-30 VITALS — BP 162/62 | HR 76 | Temp 98.2°F | Resp 18

## 2019-12-30 DIAGNOSIS — R634 Abnormal weight loss: Secondary | ICD-10-CM | POA: Diagnosis not present

## 2019-12-30 DIAGNOSIS — C541 Malignant neoplasm of endometrium: Secondary | ICD-10-CM | POA: Diagnosis not present

## 2019-12-30 DIAGNOSIS — N184 Chronic kidney disease, stage 4 (severe): Secondary | ICD-10-CM | POA: Diagnosis not present

## 2019-12-30 DIAGNOSIS — C775 Secondary and unspecified malignant neoplasm of intrapelvic lymph nodes: Secondary | ICD-10-CM | POA: Diagnosis not present

## 2019-12-30 DIAGNOSIS — Z5111 Encounter for antineoplastic chemotherapy: Secondary | ICD-10-CM | POA: Diagnosis not present

## 2019-12-30 DIAGNOSIS — I129 Hypertensive chronic kidney disease with stage 1 through stage 4 chronic kidney disease, or unspecified chronic kidney disease: Secondary | ICD-10-CM | POA: Diagnosis not present

## 2019-12-30 MED ORDER — HEPARIN SOD (PORK) LOCK FLUSH 100 UNIT/ML IV SOLN
500.0000 [IU] | Freq: Once | INTRAVENOUS | Status: AC | PRN
  Administered 2019-12-30: 500 [IU]
  Filled 2019-12-30: qty 5

## 2019-12-30 MED ORDER — SODIUM CHLORIDE 0.9 % IV SOLN
87.5000 mg/m2 | Freq: Once | INTRAVENOUS | Status: AC
  Administered 2019-12-30: 162 mg via INTRAVENOUS
  Filled 2019-12-30: qty 27

## 2019-12-30 MED ORDER — SODIUM CHLORIDE 0.9% FLUSH
10.0000 mL | INTRAVENOUS | Status: DC | PRN
  Administered 2019-12-30: 10 mL
  Filled 2019-12-30: qty 10

## 2019-12-30 MED ORDER — SODIUM CHLORIDE 0.9 % IV SOLN
10.0000 mg | Freq: Once | INTRAVENOUS | Status: AC
  Administered 2019-12-30: 10 mg via INTRAVENOUS
  Filled 2019-12-30: qty 10

## 2019-12-30 MED ORDER — SODIUM CHLORIDE 0.9 % IV SOLN
Freq: Once | INTRAVENOUS | Status: AC
  Filled 2019-12-30: qty 250

## 2019-12-30 MED ORDER — DIPHENHYDRAMINE HCL 50 MG/ML IJ SOLN
INTRAMUSCULAR | Status: AC
  Filled 2019-12-30: qty 1

## 2019-12-30 MED ORDER — DIPHENHYDRAMINE HCL 50 MG/ML IJ SOLN
12.5000 mg | Freq: Once | INTRAMUSCULAR | Status: AC
  Administered 2019-12-30: 12.5 mg via INTRAVENOUS

## 2019-12-30 MED ORDER — FAMOTIDINE IN NACL 20-0.9 MG/50ML-% IV SOLN
INTRAVENOUS | Status: AC
  Filled 2019-12-30: qty 50

## 2019-12-30 MED ORDER — PALONOSETRON HCL INJECTION 0.25 MG/5ML
INTRAVENOUS | Status: AC
  Filled 2019-12-30: qty 5

## 2019-12-30 MED ORDER — PALONOSETRON HCL INJECTION 0.25 MG/5ML
0.2500 mg | Freq: Once | INTRAVENOUS | Status: AC
  Administered 2019-12-30: 0.25 mg via INTRAVENOUS

## 2019-12-30 MED ORDER — FAMOTIDINE IN NACL 20-0.9 MG/50ML-% IV SOLN
20.0000 mg | Freq: Once | INTRAVENOUS | Status: AC
  Administered 2019-12-30: 20 mg via INTRAVENOUS

## 2019-12-30 MED ORDER — SODIUM CHLORIDE 0.9 % IV SOLN
150.0000 mg | Freq: Once | INTRAVENOUS | Status: AC
  Administered 2019-12-30: 150 mg via INTRAVENOUS
  Filled 2019-12-30: qty 150

## 2019-12-30 MED ORDER — SODIUM CHLORIDE 0.9 % IV SOLN
250.0000 mg | Freq: Once | INTRAVENOUS | Status: AC
  Administered 2019-12-30: 250 mg via INTRAVENOUS
  Filled 2019-12-30 (×2): qty 25

## 2019-12-30 NOTE — Assessment & Plan Note (Signed)
She has significant fluctuation of her renal function We will proceed with treatment as scheduled  She is instructed to increase oral fluid hydration as tolerated and aggressive risk factor management

## 2019-12-30 NOTE — Assessment & Plan Note (Signed)
She continues to tolerate treatment very poorly She has progressive weight loss, uncontrolled hypertension, persistent renal failure, constipation and others Despite being with family for additional support, she did not thrive in the environment She was not able to get established in [place] until next month For now, the family is willing to transport her back and forth to complete her chemotherapy My plan would be to space out and increase the interval between 1 treatment to another to 4 weeks and prescribed further dose adjustment due to her recent weight loss and changes in her renal function We have extensive discussions about the importance of aggressive management of supportive care I plan to repeat imaging study after cycle 6 of treatment and at that point in time, we will review the imaging study and decide whether she would benefit from additional modality with radiation treatment

## 2019-12-30 NOTE — Assessment & Plan Note (Signed)
She had recent progressive weight loss I will adjust the dose of her treatment

## 2019-12-30 NOTE — Patient Instructions (Signed)
[facility] Discharge Instructions for Patients Receiving Chemotherapy  Today you received the following chemotherapy agents Paclitaxel (TAXOL) & Carboplatin (PARAPLATIN).  To help prevent nausea and vomiting after your treatment, we encourage you to take your nausea medication as prescribed.  If you develop nausea and vomiting that is not controlled by your nausea medication, call the clinic.   BELOW ARE SYMPTOMS THAT SHOULD BE REPORTED IMMEDIATELY:  *FEVER GREATER THAN 100.5 F  *CHILLS WITH OR WITHOUT FEVER  NAUSEA AND VOMITING THAT IS NOT CONTROLLED WITH YOUR NAUSEA MEDICATION  *UNUSUAL SHORTNESS OF BREATH  *UNUSUAL BRUISING OR BLEEDING  TENDERNESS IN MOUTH AND THROAT WITH OR WITHOUT PRESENCE OF ULCERS  *URINARY PROBLEMS  *BOWEL PROBLEMS  UNUSUAL RASH Items with * indicate a potential emergency and should be followed up as soon as possible.  Feel free to call the clinic should you have any questions or concerns. The clinic phone number is [phone].  Please show the CHEMO ALERT CARD at check-in to the Emergency Department and triage nurse.   

## 2019-12-30 NOTE — Assessment & Plan Note (Signed)
She has severe pancytopenia despite increasing the interval between 1 treatment to another I suspect this is related to her recent weight loss Plan to reduce the dose of chemotherapy a little bit We will proceed with treatment without delay

## 2019-12-30 NOTE — Progress Notes (Signed)
[facility] OFFICE PROGRESS NOTE  Patient Care Team: [name], NP as PCP - General (Geriatric Medicine) Jettie Booze, MD as PCP - Cardiology (Cardiology) Esperanza Heir, MD as Consulting Physician (Obstetrics and Gynecology) Consuella Lose, MD as Consulting Physician (Neurosurgery) Deterding, Jeneen Rinks, MD as Consulting Physician (Nephrology)  ASSESSMENT & PLAN:  Endometrial cancer [facility]) She continues to tolerate treatment very poorly She has progressive weight loss, uncontrolled hypertension, persistent renal failure, constipation and others Despite being with family for additional support, she did not thrive in the environment She was not able to get established in [place] until next month For now, the family is willing to transport her back and forth to complete her chemotherapy My plan would be to space out and increase the interval between 1 treatment to another to 4 weeks and prescribed further dose adjustment due to her recent weight loss and changes in her renal function We have extensive discussions about the importance of aggressive management of supportive care I plan to repeat imaging study after cycle 6 of treatment and at that point in time, we will review the imaging study and decide whether she would benefit from additional modality with radiation treatment  Pancytopenia, acquired [facility]) She has severe pancytopenia despite increasing the interval between 1 treatment to another I suspect this is related to her recent weight loss Plan to reduce the dose of chemotherapy a little bit We will proceed with treatment without delay  Essential hypertension She has poorly controlled hypertension In talking to the patient, it was felt that she might not be taking her medications correctly The patient is on 5 different medication that could affect her blood pressure With poorly controlled hypertension, it can affect her renal function and her  overall health I have extensive discussion with her son today and he will make sure that she is taking her medications correctly I recommend close monitoring and follow-up with her primary care doctor for medication adjustment and to start checking her blood pressure on a regular basis at home  Chronic kidney disease, stage 4 (severe) ([place]) She has significant fluctuation of her renal function We will proceed with treatment as scheduled  She is instructed to increase oral fluid hydration as tolerated and aggressive risk factor management   Other constipation We discussed the importance of aggressive laxative therapy while on chemotherapy We have discussed this extensively I shared with the patient and family that constipation can affect appetite and could lead to weight loss  Weight loss She had recent progressive weight loss I will adjust the dose of her treatment   No orders of the defined types were placed in this encounter.   All questions were answered. The patient knows to call the clinic with any problems, questions or concerns. The total time spent in the appointment was 40 minutes encounter with patients including review of chart and various tests results, discussions about plan of care and coordination of care plan   Heath Lark, MD 12/30/2019 8:10 AM  INTERVAL HISTORY: Please see below for problem oriented charting. She returns with her son for further follow-up The patient just arrived home from [place] She was not able to get established in [place] early enough to get her chemotherapy completed there Since last time I saw her, she has progressive weight loss She has intermittent constipation and does not take laxatives on a regular basis Denies nausea No recent infection, fever or chills No peripheral neuropathy She continues to have poorly controlled  hypertension From talking to the patient, she might not be taking her medications correctly They did not bring her  pill bottles with her today  SUMMARY OF ONCOLOGIC HISTORY: Oncology History Overview Note  MMR IHC normal, MSI-stable HER2 negative (1+)   History of left breast cancer  Endometrial cancer (HCC)  08/23/2019 Initial Biopsy   D&C A. ENDOMETRIUM, CURETTAGE:  -  High-grade carcinoma  -  See comment  COMMENT:  The biopsy consists of a high-grade carcinoma.  Based on the biopsy the differential diagnosis includes serous, high-grade endometrioid and undifferentiated carcinoma.  These results were discussed with Dr. Sharol Roussel nurse, Olegario Messier, on August 24, 2019.    08/27/2019 Initial Diagnosis   Endometrial cancer (HCC)   09/01/2019 Imaging   CT C/A/P: 1. Enlarged uterus and heterogeneous endometrial cavity, consistent with the given diagnosis of uterine/cervical cancer. Small to borderline enlarged pelvic retroperitoneal lymph nodes are highly worrisome for metastatic disease. 2. Borderline enlarged thoracic inlet and mediastinal lymph nodes also worrisome for malignancy. 3. Tiny subpleural nodules are likely benign subpleural lymph nodes. Recommend attention on follow-up. 4. Aortic atherosclerosis (ICD10-I70.0). Coronary artery calcification.   09/09/2019 Surgery   Robotic-assisted laparoscopic total hysterectomy with bilateral salpingoophorectomy, SLN biopsy, right pelvic and para-aortic lymphadenectomy  On EUA, bulky enlarged uterus, mobile. Normal upper abdominal survey although prominent gallbladder and some filmy adhesions between the liver and anterior abdominal wall. Normal appearing omentum, small and large bowel. Uterus quite bulky and 10-12cm. Normal appearing adnexa. Mapping successful to right obturator and left external iliac SLNs. Multiple obturator lymph nodes on the right, an external iliac lymph node on the right, and lymph nodes along the common iliac and aorta on the right all enlarged, adherent to surround vessels, and filled with tumor. All grossly involved lymph nodes  removed.   09/09/2019 Pathology Results   A. SENTINEL LYMPH NODE, RIGHT OBTURATOR, ADJACENT ENLARGED LYMPH NODE,  EXCISION:  - Metastatic serous carcinoma, see comment   B. LYMPH NODE, RIGHT EXTERNAL ILIAC, BIOPSY:  - Benign lymph node, negative for carcinoma (0/1)   C. SENTINEL LYMPH NODE, LEFT EXTERNAL ILIAC, BIOPSY:  - Benign lymph node, negative for carcinoma (0/1)   D. 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 UTERUS, CERVIX, BILATERAL TUBE AND OVARIES:  - Invasive high-grade serous carcinoma, 8.2 cm  - Carcinoma invades for depth of 2.0 cm with a myometrial thickness is  2.2 cm  - Carcinoma invades into the cervical stroma  - Extensive lymphovascular invasion is present  - Benign unremarkable bilateral fallopian tubes and ovaries  - See oncology table   F. 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 LYMPH NODES, RIGHT COMMON AND PERIAORTIC, RESECTION:  - Metastatic serous carcinoma, see comment      COMMENT:   A.   Lymphoid tissue is not identified.  Findings likely represent an  entirely replaced lymph node(s).   G. Multiple fragments of lymph node(s) were received. The exact number  of lymph nodes cannot be determined but does not affect the nodal stage.     ONCOLOGY TABLE:   UTERUS, CARCINOMA OR CARCINOSARCOMA   Procedure: Total hysterectomy and bilateral salpingo-oophorectomy  Histologic type: Serous carcinoma  Histologic Grade: High-grade  Myometrial invasion:       Depth of invasion: 20 mm       Myometrial thickness: 22 mm  Uterine Serosa Involvement: Not  identified  Cervical stromal involvement: Present  Extent of involvement of other organs: Not identified  Lymphovascular invasion: Present, extensive  Regional Lymph Nodes:        Examined:        At least 3 Sentinel                               At least 3 Non-sentinel                                At least 6 Total        Lymph nodes with metastasis: 4        Isolated tumor cells (<0.2 mm): 0        Micrometastasis: (>0.2 mm and < 2.0 mm): 0        Macrometastasis: (>2.0 mm): 4        Extracapsular extension: Present  Representative Tumor Block: E8  MMR / MSI testing: Will be ordered  Pathologic Stage Classification (pTNM, AJCC 8th edition):  pT2, pN2a    09/09/2019 Tumor Marker   Patient's tumor was tested for the following markers: CA-125. Results of the tumor marker test revealed: 181.   09/09/2019 Cancer Staging   Staging form: Corpus Uteri - Carcinoma and Carcinosarcoma, AJCC 8th Edition - Clinical stage from 09/09/2019: FIGO Stage IIIC2, calculated as Stage IVB (cT2, cN2a, pM1) - Signed by Lafonda Mosses, MD on 09/15/2019   10/11/2019 Procedure   Successful placement of a power injectable Port-A-Cath via the left internal jugular vein. The catheter is ready for immediate use. Prominent right cervical lymphadenopathy and occlusion of the right internal jugular vein precluded right-sided placement.       10/21/2019 -  Chemotherapy   The patient had carboplatin and taxol for chemotherapy treatment.       REVIEW OF SYSTEMS:   Constitutional: Denies fevers, chills  Eyes: Denies blurriness of vision Ears, nose, mouth, throat, and face: Denies mucositis or sore throat Respiratory: Denies cough, dyspnea or wheezes Cardiovascular: Denies palpitation, chest discomfort or lower extremity swelling Skin: Denies abnormal skin rashes Lymphatics: Denies new lymphadenopathy or easy bruising Neurological:Denies numbness, tingling or new weaknesses Behavioral/Psych: Mood is stable, no new changes  All other systems were reviewed with the patient and are negative.  I have reviewed the past medical history, past surgical history, social history and family history with the patient and they are unchanged from previous note.  ALLERGIES:  is allergic to shellfish allergy  and neomy-bacit-polymyx-pramoxine.  MEDICATIONS:  Current Outpatient Medications  Medication Sig Dispense Refill  . acetaminophen (TYLENOL) 500 MG tablet Take 1,000 mg by mouth every 6 (six) hours as needed for moderate pain.     Marland Kitchen amLODipine (NORVASC) 10 MG tablet TAKE 1 TABLET BY MOUTH EVERY DAY 90 tablet 0  . aspirin 81 MG EC tablet Take 81 mg by mouth daily.     . bumetanide (BUMEX) 2 MG tablet Take 2 mg by mouth daily.  6  . busPIRone (BUSPAR) 10 MG tablet TAKE 1 TABLET BY MOUTH TWICE DAILY .DX F41.9 APPT IS OVERDUE 180 tablet 1  . Carboxymethylcellulose Sodium (EYE DROPS OP) Place 1 drop into both eyes daily as needed (dry eyes).     Marland Kitchen Cod Liver Oil 1000 MG CAPS Take 1,000 mg by mouth 2 (two) times daily.     Marland Kitchen dexamethasone (DECADRON) 4 MG tablet  Take 2 tabs at the night before and 2 tab the morning of chemotherapy, every 3 weeks, by mouth x 6 cycles 8 tablet 6  . donepezil (ARICEPT) 5 MG tablet TAKE 1 TABLET BY MOUTH EVERYDAY AT BEDTIME DX: G31.84 90 tablet 0  . hydrALAZINE (APRESOLINE) 100 MG tablet TAKE 1 TABLET (100 MG TOTAL) BY MOUTH 3 (THREE) TIMES DAILY. 270 tablet 1  . levothyroxine (SYNTHROID) 75 MCG tablet Take one tablet by mouth once daily 30 minutes before breakfast for thyroid 90 tablet 0  . lidocaine-prilocaine (EMLA) cream Apply to affected area once 30 g 3  . losartan (COZAAR) 100 MG tablet TAKE 1 TABLET BY MOUTH EVERYDAY AT BEDTIME 90 tablet 1  . metoprolol succinate (TOPROL-XL) 100 MG 24 hr tablet Take 1 tablet (100 mg total) by mouth in the morning and at bedtime. Take with or immediately following a meal. 180 tablet 0  . Multiple Vitamin (MULTIVITAMIN ADULT PO) Take 1 tablet by mouth daily.    . ondansetron (ZOFRAN) 8 MG tablet Take 1 tablet (8 mg total) by mouth every 8 (eight) hours as needed. Start on the third day after chemotherapy but only take if needed 30 tablet 1  . prochlorperazine (COMPAZINE) 10 MG tablet Take 1 tablet (10 mg total) by mouth every 6 (six)  hours as needed (Nausea or vomiting). 30 tablet 1  . senna-docusate (SENOKOT-S) 8.6-50 MG tablet Take 2 tablets by mouth at bedtime. For AFTER surgery, do not take if having diarrhea 30 tablet 0  . traZODone (DESYREL) 50 MG tablet Take one tablet by mouth once daily at bedtime 90 tablet 0   No current facility-administered medications for this visit.    PHYSICAL EXAMINATION: ECOG PERFORMANCE STATUS: 2 - Symptomatic, <50% confined to bed  Vitals:   12/29/19 1339  BP: (!) 189/53  Pulse: 71  Resp: 18  Temp: 98.3 F (36.8 C)  SpO2: 100%   Filed Weights   12/29/19 1339  Weight: 161 lb (73 kg)    GENERAL:alert, no distress and comfortable SKIN: skin color, texture, turgor are normal, no rashes or significant lesions EYES: normal, Conjunctiva are pink and non-injected, sclera clear OROPHARYNX:no exudate, no erythema and lips, buccal mucosa, and tongue normal  NECK: supple, thyroid normal size, non-tender, without nodularity LYMPH:  no palpable lymphadenopathy in the cervical, axillary or inguinal LUNGS: clear to auscultation and percussion with normal breathing effort HEART: regular rate & rhythm and no murmurs and no lower extremity edema ABDOMEN:abdomen soft, non-tender and normal bowel sounds Musculoskeletal:no cyanosis of digits and no clubbing  NEURO: alert & oriented x 3 with fluent speech, no focal motor/sensory deficits  LABORATORY DATA:  I have reviewed the data as listed    Component Value Date/Time   NA 141 12/29/2019 1318   K 3.7 12/29/2019 1318   CL 107 12/29/2019 1318   CO2 28 12/29/2019 1318   GLUCOSE 98 12/29/2019 1318   BUN 21 12/29/2019 1318   CREATININE 1.82 (H) 12/29/2019 1318   CREATININE 1.78 (H) 09/29/2019 1100   CALCIUM 9.1 12/29/2019 1318   PROT 7.6 12/29/2019 1318   ALBUMIN 3.7 12/29/2019 1318   AST 21 12/29/2019 1318   ALT 14 12/29/2019 1318   ALKPHOS 37 (L) 12/29/2019 1318   BILITOT 0.4 12/29/2019 1318   GFRNONAA 27 (L) 12/29/2019 1318    GFRNONAA 26 (L) 09/29/2019 1100   GFRAA 15 (L) 10/13/2019 1312   GFRAA 30 (L) 09/29/2019 1100    No results found for: SPEP,  UPEP  Lab Results  Component Value Date   WBC 2.7 (L) 12/29/2019   NEUTROABS 1.1 (L) 12/29/2019   HGB 9.2 (L) 12/29/2019   HCT 27.3 (L) 12/29/2019   MCV 92.5 12/29/2019   PLT 155 12/29/2019      Chemistry      Component Value Date/Time   NA 141 12/29/2019 1318   K 3.7 12/29/2019 1318   CL 107 12/29/2019 1318   CO2 28 12/29/2019 1318   BUN 21 12/29/2019 1318   CREATININE 1.82 (H) 12/29/2019 1318   CREATININE 1.78 (H) 09/29/2019 1100      Component Value Date/Time   CALCIUM 9.1 12/29/2019 1318   ALKPHOS 37 (L) 12/29/2019 1318   AST 21 12/29/2019 1318   ALT 14 12/29/2019 1318   BILITOT 0.4 12/29/2019 1318

## 2019-12-30 NOTE — Assessment & Plan Note (Signed)
We discussed the importance of aggressive laxative therapy while on chemotherapy We have discussed this extensively I shared with the patient and family that constipation can affect appetite and could lead to weight loss

## 2019-12-30 NOTE — Progress Notes (Signed)
Per Dr. Alvy Bimler ok to proceed today with yesterday's lab values.

## 2019-12-30 NOTE — Telephone Encounter (Signed)
Released records to [facility] to [phone]   Release: [phone]

## 2019-12-30 NOTE — Assessment & Plan Note (Signed)
She has poorly controlled hypertension In talking to the patient, it was felt that she might not be taking her medications correctly The patient is on 5 different medication that could affect her blood pressure With poorly controlled hypertension, it can affect her renal function and her overall health I have extensive discussion with her son today and he will make sure that she is taking her medications correctly I recommend close monitoring and follow-up with her primary care doctor for medication adjustment and to start checking her blood pressure on a regular basis at home

## 2019-12-31 ENCOUNTER — Ambulatory Visit: Payer: Medicare Other | Admitting: Family

## 2019-12-31 DIAGNOSIS — C7989 Secondary malignant neoplasm of other specified sites: Secondary | ICD-10-CM | POA: Diagnosis not present

## 2019-12-31 DIAGNOSIS — C541 Malignant neoplasm of endometrium: Secondary | ICD-10-CM | POA: Diagnosis not present

## 2019-12-31 DIAGNOSIS — C775 Secondary and unspecified malignant neoplasm of intrapelvic lymph nodes: Secondary | ICD-10-CM | POA: Diagnosis not present

## 2019-12-31 DIAGNOSIS — C7982 Secondary malignant neoplasm of genital organs: Secondary | ICD-10-CM | POA: Diagnosis not present

## 2019-12-31 DIAGNOSIS — C548 Malignant neoplasm of overlapping sites of corpus uteri: Secondary | ICD-10-CM | POA: Diagnosis not present

## 2019-12-31 DIAGNOSIS — C772 Secondary and unspecified malignant neoplasm of intra-abdominal lymph nodes: Secondary | ICD-10-CM | POA: Diagnosis not present

## 2020-01-11 ENCOUNTER — Telehealth: Payer: Self-pay | Admitting: *Deleted

## 2020-01-11 NOTE — Telephone Encounter (Signed)
Received call from pt daughter [name] stating in past discussions with MD, the pts weight loss has been an issue with treatment.  Stating dose adjustments have been made and requesting if she can call the office one week prior to tx date with updated weight to see if infusion will be canceled.  States pt is traveling from out of town for apts and the family would prefer to know if tx may be canceled due to pt experiencing more weight loss. RN encouraged [name] to call one week prior to tx date and inform us of pts weight.  Will review with MD at that time for further instructions.  Verbalized understanding and appreciative of the advice.

## 2020-01-12 ENCOUNTER — Telehealth (INDEPENDENT_AMBULATORY_CARE_PROVIDER_SITE_OTHER): Payer: Medicare Other | Admitting: Nurse Practitioner

## 2020-01-12 ENCOUNTER — Encounter: Payer: Self-pay | Admitting: Nurse Practitioner

## 2020-01-12 ENCOUNTER — Other Ambulatory Visit: Payer: Self-pay

## 2020-01-12 ENCOUNTER — Telehealth: Payer: Self-pay

## 2020-01-12 DIAGNOSIS — F0280 Dementia in other diseases classified elsewhere without behavioral disturbance: Secondary | ICD-10-CM | POA: Diagnosis not present

## 2020-01-12 DIAGNOSIS — I10 Essential (primary) hypertension: Secondary | ICD-10-CM | POA: Diagnosis not present

## 2020-01-12 DIAGNOSIS — G4700 Insomnia, unspecified: Secondary | ICD-10-CM | POA: Diagnosis not present

## 2020-01-12 DIAGNOSIS — G301 Alzheimer's disease with late onset: Secondary | ICD-10-CM

## 2020-01-12 DIAGNOSIS — C541 Malignant neoplasm of endometrium: Secondary | ICD-10-CM

## 2020-01-12 DIAGNOSIS — N184 Chronic kidney disease, stage 4 (severe): Secondary | ICD-10-CM | POA: Diagnosis not present

## 2020-01-12 DIAGNOSIS — R634 Abnormal weight loss: Secondary | ICD-10-CM

## 2020-01-12 DIAGNOSIS — M545 Low back pain, unspecified: Secondary | ICD-10-CM

## 2020-01-12 DIAGNOSIS — G8929 Other chronic pain: Secondary | ICD-10-CM

## 2020-01-12 DIAGNOSIS — L299 Pruritus, unspecified: Secondary | ICD-10-CM | POA: Diagnosis not present

## 2020-01-12 NOTE — Patient Instructions (Addendum)
Ask oncologist about her COVID booster  Recommend flu vaccine and TDAP.   Blood pressure goal should be <140/90   DASH Eating Plan DASH stands for "Dietary Approaches to Stop Hypertension." The DASH eating plan is a healthy eating plan that has been shown to reduce high blood pressure (hypertension). It may also reduce your risk for type 2 diabetes, heart disease, and stroke. The DASH eating plan may also help with weight loss. What are tips for following this plan?  General guidelines  Avoid eating more than 2,300 mg (milligrams) of salt (sodium) a day. If you have hypertension, you may need to reduce your sodium intake to 1,500 mg a day.  Limit alcohol intake to no more than 1 drink a day for nonpregnant women and 2 drinks a day for men. One drink equals 12 oz of beer, 5 oz of wine, or 1 oz of hard liquor.  Work with your health care provider to maintain a healthy body weight or to lose weight. Ask what an ideal weight is for you.  Get at least 30 minutes of exercise that causes your heart to beat faster (aerobic exercise) most days of the week. Activities may include walking, swimming, or biking.  Work with your health care provider or diet and nutrition specialist (dietitian) to adjust your eating plan to your individual calorie needs. Reading food labels   Check food labels for the amount of sodium per serving. Choose foods with less than 5 percent of the Daily Value of sodium. Generally, foods with less than 300 mg of sodium per serving fit into this eating plan.  To find whole grains, look for the word "whole" as the first word in the ingredient list. Shopping  Buy products labeled as "low-sodium" or "no salt added."  Buy fresh foods. Avoid canned foods and premade or frozen meals. Cooking  Avoid adding salt when cooking. Use salt-free seasonings or herbs instead of table salt or sea salt. Check with your health care provider or pharmacist before using salt  substitutes.  Do not fry foods. Cook foods using healthy methods such as baking, boiling, grilling, and broiling instead.  Cook with heart-healthy oils, such as olive, canola, soybean, or sunflower oil. Meal planning  Eat a balanced diet that includes: ? 5 or more servings of fruits and vegetables each day. At each meal, try to fill half of your plate with fruits and vegetables. ? Up to 6-8 servings of whole grains each day. ? Less than 6 oz of lean meat, poultry, or fish each day. A 3-oz serving of meat is about the same size as a deck of cards. One egg equals 1 oz. ? 2 servings of low-fat dairy each day. ? A serving of nuts, seeds, or beans 5 times each week. ? Heart-healthy fats. Healthy fats called Omega-3 fatty acids are found in foods such as flaxseeds and coldwater fish, like sardines, salmon, and mackerel.  Limit how much you eat of the following: ? Canned or prepackaged foods. ? Food that is high in trans fat, such as fried foods. ? Food that is high in saturated fat, such as fatty meat. ? Sweets, desserts, sugary drinks, and other foods with added sugar. ? Full-fat dairy products.  Do not salt foods before eating.  Try to eat at least 2 vegetarian meals each week.  Eat more home-cooked food and less restaurant, buffet, and fast food.  When eating at a restaurant, ask that your food be prepared with less salt or  no salt, if possible. °What foods are recommended? °The items listed may not be a complete list. Talk with your dietitian about what dietary choices are best for you. °Grains °Whole-grain or whole-wheat bread. Whole-grain or whole-wheat pasta. Brown rice. Oatmeal. Quinoa. Bulgur. Whole-grain and low-sodium cereals. Pita bread. Low-fat, low-sodium crackers. Whole-wheat flour tortillas. °Vegetables °Fresh or frozen vegetables (raw, steamed, roasted, or grilled). Low-sodium or reduced-sodium tomato and vegetable juice. Low-sodium or reduced-sodium tomato sauce and tomato  paste. Low-sodium or reduced-sodium canned vegetables. °Fruits °All fresh, dried, or frozen fruit. Canned fruit in natural juice (without added sugar). °Meat and other protein foods °Skinless chicken or turkey. Ground chicken or turkey. Pork with fat trimmed off. Fish and seafood. Egg whites. Dried beans, peas, or lentils. Unsalted nuts, nut butters, and seeds. Unsalted canned beans. Lean cuts of beef with fat trimmed off. Low-sodium, lean deli meat. °Dairy °Low-fat (1%) or fat-free (skim) milk. Fat-free, low-fat, or reduced-fat cheeses. Nonfat, low-sodium ricotta or cottage cheese. Low-fat or nonfat yogurt. Low-fat, low-sodium cheese. °Fats and oils °Soft margarine without trans fats. Vegetable oil. Low-fat, reduced-fat, or light mayonnaise and salad dressings (reduced-sodium). Canola, safflower, olive, soybean, and sunflower oils. Avocado. °Seasoning and other foods °Herbs. Spices. Seasoning mixes without salt. Unsalted popcorn and pretzels. Fat-free sweets. °What foods are not recommended? °The items listed may not be a complete list. Talk with your dietitian about what dietary choices are best for you. °Grains °Baked goods made with fat, such as croissants, muffins, or some breads. Dry pasta or rice meal packs. °Vegetables °Creamed or fried vegetables. Vegetables in a cheese sauce. Regular canned vegetables (not low-sodium or reduced-sodium). Regular canned tomato sauce and paste (not low-sodium or reduced-sodium). Regular tomato and vegetable juice (not low-sodium or reduced-sodium). Pickles. Olives. °Fruits °Canned fruit in a light or heavy syrup. Fried fruit. Fruit in cream or butter sauce. °Meat and other protein foods °Fatty cuts of meat. Ribs. Fried meat. Bacon. Sausage. Bologna and other processed lunch meats. Salami. Fatback. Hotdogs. Bratwurst. Salted nuts and seeds. Canned beans with added salt. Canned or smoked fish. Whole eggs or egg yolks. Chicken or turkey with skin. °Dairy °Whole or 2% milk,  cream, and half-and-half. Whole or full-fat cream cheese. Whole-fat or sweetened yogurt. Full-fat cheese. Nondairy creamers. Whipped toppings. Processed cheese and cheese spreads. °Fats and oils °Butter. Stick margarine. Lard. Shortening. Ghee. Bacon fat. Tropical oils, such as coconut, palm kernel, or palm oil. °Seasoning and other foods °Salted popcorn and pretzels. Onion salt, garlic salt, seasoned salt, table salt, and sea salt. Worcestershire sauce. Tartar sauce. Barbecue sauce. Teriyaki sauce. Soy sauce, including reduced-sodium. Steak sauce. Canned and packaged gravies. Fish sauce. Oyster sauce. Cocktail sauce. Horseradish that you find on the shelf. Ketchup. Mustard. Meat flavorings and tenderizers. Bouillon cubes. Hot sauce and Tabasco sauce. Premade or packaged marinades. Premade or packaged taco seasonings. Relishes. Regular salad dressings. °Where to find more information: °· National Heart, Lung, and Blood Institute: www.nhlbi.nih.gov °· American Heart Association: www.heart.org °Summary °· The DASH eating plan is a healthy eating plan that has been shown to reduce high blood pressure (hypertension). It may also reduce your risk for type 2 diabetes, heart disease, and stroke. °· With the DASH eating plan, you should limit salt (sodium) intake to 2,300 mg a day. If you have hypertension, you may need to reduce your sodium intake to 1,500 mg a day. °· When on the DASH eating plan, aim to eat more fresh fruits and vegetables, whole grains, lean proteins, low-fat dairy, and   heart-healthy fats. °· Work with your health care provider or diet and nutrition specialist (dietitian) to adjust your eating plan to your individual calorie needs. °This information is not intended to replace advice given to you by your health care provider. Make sure you discuss any questions you have with your health care provider. °Document Revised: 12/13/2016 Document Reviewed: 12/25/2015 °Elsevier Patient Education © 2020 [employer] ° °

## 2020-01-12 NOTE — Progress Notes (Signed)
   This service is provided via telemedicine  No vital signs collected/recorded due to the encounter was a telemedicine visit.   Location of patient (ex: home, work):  Home  Patient consents to a telephone visit: Yes, see telephone visit dated   Location of the provider (ex: office, home):  [facility], Office   Name of any referring provider:  N/A  Names of all persons participating in the telemedicine service and their role in the encounter:  S.Chrae B/CMA, Sherrie Mustache, NP, [name] (daughter), and Patient   Time spent on call:  9 min with medical assistant

## 2020-01-12 NOTE — Progress Notes (Signed)
Careteam: Patient Care Team: [name], NP as PCP - General (Geriatric Medicine) Jettie Booze, MD as PCP - Cardiology (Cardiology) Esperanza Heir, MD as Consulting Physician (Obstetrics and Gynecology) Consuella Lose, MD as Consulting Physician (Neurosurgery) Deterding, Jeneen Rinks, MD as Consulting Physician (Nephrology)  Advanced Directive information Does Patient Have a Medical Advance Directive?: No, Would patient like information on creating a medical advance directive?: No - Patient declined  Allergies  Allergen Reactions  . Shellfish Allergy Swelling and Rash    MOUTH  . Neomy-Bacit-Polymyx-Pramoxine     UNSPECIFIED REACTION  Not sure which antibiotic she has a reaction to    Chief Complaint  Patient presents with  . Acute Visit    Patient requested visit for routine follow-up. Patient is planning to move [place] with her daughter. Discuss Tylenol dosing. Discuss need for mammogram, covid booster, and TD/tdap     HPI: Patient is a 85 y.o. female for follow up Plans to move to [facility] to "follow her children"  Reports sleeping better- vivid dreams have improved.  Decrease energy so she does not move around a lot during the day.   Dementia- continues on aricept   Itching- resolved with zyrtec 10 mg daily PRN.   Endometrial cancer- reports she has 2 more chemotherapy sessions left. She will come back to [facility] for these treatments.  Reports her eating and drinking has improved in the last month.   htn- continues to be elevated- does not have a reading today but reports spb 160s. Reports she takes her pills with her pill box.  She is taking losartan 100 mg daily, metoprolol 100 mg BID, hydralazine 100 mg TID, norvasc 10 mg daily. 164/64  CKD stage 4, kidney function stable on last labs.   Back pain/headaches- uses tylenol 1000 mg by mouth every 8 hours as needed pain.   Review of Systems:  Review of Systems  Constitutional: Negative for  chills, fever and weight loss.  HENT: Negative for tinnitus.   Respiratory: Negative for cough, sputum production and shortness of breath.   Cardiovascular: Negative for chest pain, palpitations and leg swelling.  Gastrointestinal: Negative for abdominal pain, constipation, diarrhea and heartburn.  Genitourinary: Negative for dysuria, frequency and urgency.  Musculoskeletal: Positive for back pain and myalgias. Negative for falls and joint pain.  Skin: Negative.   Neurological: Negative for dizziness and headaches.  Psychiatric/Behavioral: Positive for memory loss. Negative for depression. The patient does not have insomnia.     Past Medical History:  Diagnosis Date  . Allergy    mild   . Anemia   . Arthritis    pt states no pain   . Breast cancer ([place])    left   . Chronic kidney disease    ckd stage 4 per lov dr deterding 08-03-2018 on chart  . Dementia ([place])   . Depression   . Gastroesophageal reflux disease   . Heart murmur   . Heartburn   . Hypertension   . Hypothyroidism   . Leg cramps   . Post-menopause bleeding   . Restless leg syndrome 10/07/2014  . Sleep apnea    cpap not in use    Past Surgical History:  Procedure Laterality Date  . back sugery   2019   lower back   . BREAST LUMPECTOMY    . BREAST SURGERY Left 2013   Lumpectomy  radiation done no chemo  . CATARACT EXTRACTION, BILATERAL    . DILATATION & CURETTAGE/HYSTEROSCOPY WITH MYOSURE  N/A 08/23/2019   Procedure: DILATATION & CURETTAGE/HYSTEROSCOPY WITH MYOSURE;  Surgeon: [name], MD;  Location: [place];  Service: Gynecology;  Laterality: N/A;  request to follow 2nd case in [place] block requests one hour  . hysteroscopy biopsy    . IR IMAGING GUIDED PORT INSERTION  10/11/2019  . ROBOTIC ASSISTED TOTAL HYSTERECTOMY WITH BILATERAL SALPINGO OOPHERECTOMY Bilateral 09/09/2019   Procedure: XI ROBOTIC ASSISTED TOTAL HYSTERECTOMY WITH BILATERAL SALPINGO OOPHORECTOMY;  Surgeon:  Lafonda Mosses, MD;  Location: [facility];  Service: Gynecology;  Laterality: Bilateral;  . SENTINEL NODE BIOPSY N/A 09/09/2019   Procedure: SENTINEL NODE BIOPSY AND POSSIBLE LYMPH NODE DISECTION AND POSSIBLE LAPAROTOMY;  Surgeon: Lafonda Mosses, MD;  Location: [facility];  Service: Gynecology;  Laterality: N/A;   Social History:   reports that she quit smoking about 14 years ago. She has a 2.00 pack-year smoking history. She has never used smokeless tobacco. She reports that she does not drink alcohol and does not use drugs.  Family History  Problem Relation Age of Onset  . Hypertension Mother   . Arthritis Mother   . Heart disease Mother   . Hypertension Father   . Heart disease Father   . Breast cancer Sister   . Lung cancer Brother   . Breast cancer Paternal Aunt   . Colon cancer Neg Hx   . Colon polyps Neg Hx   . Esophageal cancer Neg Hx   . Rectal cancer Neg Hx   . Stomach cancer Neg Hx     Medications: Patient's Medications  New Prescriptions   No medications on file  Previous Medications   ACETAMINOPHEN (TYLENOL) 500 MG TABLET    Take 1,000 mg by mouth every 6 (six) hours as needed for moderate pain.    AMLODIPINE (NORVASC) 10 MG TABLET    TAKE 1 TABLET BY MOUTH EVERY DAY   ASPIRIN 81 MG EC TABLET    Take 81 mg by mouth daily.    BUMETANIDE (BUMEX) 2 MG TABLET    Take 2 mg by mouth daily.   BUSPIRONE (BUSPAR) 10 MG TABLET    TAKE 1 TABLET BY MOUTH TWICE DAILY .DX F41.9 APPT IS OVERDUE   CARBOXYMETHYLCELLULOSE SODIUM (EYE DROPS OP)    Place 1 drop into both eyes daily as needed (dry eyes).    COD LIVER OIL 1000 MG CAPS    Take 1,000 mg by mouth 2 (two) times daily.    DEXAMETHASONE (DECADRON) 4 MG TABLET    Take 2 tabs at the night before and 2 tab the morning of chemotherapy, every 3 weeks, by mouth x 6 cycles   DONEPEZIL (ARICEPT) 5 MG TABLET    TAKE 1 TABLET BY MOUTH EVERYDAY AT BEDTIME DX: G31.84   HYDRALAZINE (APRESOLINE) 100 MG TABLET    TAKE 1 TABLET (100 MG  TOTAL) BY MOUTH 3 (THREE) TIMES DAILY.   LEVOTHYROXINE (SYNTHROID) 75 MCG TABLET    Take one tablet by mouth once daily 30 minutes before breakfast for thyroid   LIDOCAINE-PRILOCAINE (EMLA) CREAM    Apply to affected area once   LOSARTAN (COZAAR) 100 MG TABLET    TAKE 1 TABLET BY MOUTH EVERYDAY AT BEDTIME   METOPROLOL SUCCINATE (TOPROL-XL) 100 MG 24 HR TABLET    Take 1 tablet (100 mg total) by mouth in the morning and at bedtime. Take with or immediately following a meal.   MULTIPLE VITAMIN (MULTIVITAMIN ADULT PO)    Take 1 tablet by mouth  daily.   ONDANSETRON (ZOFRAN) 8 MG TABLET    Take 1 tablet (8 mg total) by mouth every 8 (eight) hours as needed. Start on the third day after chemotherapy but only take if needed   PROCHLORPERAZINE (COMPAZINE) 10 MG TABLET    Take 1 tablet (10 mg total) by mouth every 6 (six) hours as needed (Nausea or vomiting).   SENNA-DOCUSATE (SENOKOT-S) 8.6-50 MG TABLET    Take 2 tablets by mouth at bedtime. For AFTER surgery, do not take if having diarrhea   TRAZODONE (DESYREL) 50 MG TABLET    Take one tablet by mouth once daily at bedtime  Modified Medications   No medications on file  Discontinued Medications   No medications on file    Physical Exam:  There were no vitals filed for this visit. There is no height or weight on file to calculate BMI. Wt Readings from Last 3 Encounters:  12/29/19 161 lb (73 kg)  12/02/19 167 lb 6.4 oz (75.9 kg)  11/11/19 164 lb (74.4 kg)      Labs reviewed: Basic Metabolic Panel: Recent Labs    08/31/19 1442 09/06/19 1346 11/11/19 1050 12/02/19 1105 12/29/19 1318  NA 139   < > 139 139 141  K 4.7   < > 3.9 3.6 3.7  CL 107   < > 106 105 107  CO2 23   < > 24 25 28   GLUCOSE 95   < > 96 97 98  BUN 24   < > 32* 25* 21  CREATININE 1.90*   < > 2.05* 2.03* 1.82*  CALCIUM 9.0   < > 9.1 8.8* 9.1  TSH 0.47  --   --   --   --    < > = values in this interval not displayed.   Liver Function Tests: Recent Labs     11/11/19 1050 12/02/19 1105 12/29/19 1318  AST 17 16 21   ALT 10 9 14   ALKPHOS 37* 36* 37*  BILITOT 0.2* 0.3 0.4  PROT 7.2 7.1 7.6  ALBUMIN 3.4* 3.4* 3.7   Recent Labs    09/29/19 1100  LIPASE 49  AMYLASE 99   No results for input(s): AMMONIA in the last 8760 hours. CBC: Recent Labs    11/11/19 1050 12/02/19 1105 12/29/19 1318  WBC 3.2* 2.9* 2.7*  NEUTROABS 1.6* 1.5* 1.1*  HGB 9.4* 9.0* 9.2*  HCT 28.6* 26.1* 27.3*  MCV 91.4 90.9 92.5  PLT 215 146* 155   Lipid Panel: Recent Labs    08/31/19 1442  CHOL 157  HDL 35*  LDLCALC 102*  TRIG 100  CHOLHDL 4.5   TSH: Recent Labs    08/31/19 1442  TSH 0.47   A1C: No results found for: HGBA1C   Assessment/Plan 1. Pruritus Has improved at this time. Uses zyrtec PRN.  2. Late onset Alzheimer's disease without behavioral disturbance (HCC) Stable, without acute changes to cognitive or functional status. She is still in charge of her own medication which she reports she takes consistently. Plans to move with her children. Continues on aricept daily  3. Insomnia, unspecified type -improved. Continues on trazodone.  4. Endometrial cancer ([place]) Having a hard time tolerating treatment, has had progressive weight loss. Reports she feels like she is eating better at this time. States she is planning to come back to get 2 more chemotherapy which will complete her treatment.   5. Essential hypertension Elevated and poorly controlled. She is taking Norvasc 10 mg, hydralazine 100 mg  TID, metoprolol 100 mg BID, bumex 2 mg daily, losartan 100 mg daily   6. Chronic kidney disease, stage 4 (severe) (HCC) -stable on last labs, continues to follow up with nephrology. Need for blood pressure control, proper hydration and to avoid nephrotoxic medication with renal adjustments.   7. Chronic midline low back pain without sciatica Stable on tylenol 500 mg 2 tablets every 8 hours as needed for pain.   8. Weight loss Ongoing due to  chemotherapy continue supportive care, nutritional supplements.   PRN, pt is moving out of state and will notify us when she establishes with new PCP Janett Billow K. Harle Battiest  [facility] [phone]    Virtual Visit via Deloris Ping  I connected with patient on 01/12/20 at  2:45 PM EST by video and verified that I am speaking with the correct person using two identifiers.  Location: Patient: home Provider: psc   I discussed the limitations, risks, security and privacy concerns of performing an evaluation and management service by telephone and the availability of in person appointments. I also discussed with the patient that there may be a patient responsible charge related to this service. The patient expressed understanding and agreed to proceed.   I discussed the assessment and treatment plan with the patient. The patient was provided an opportunity to ask questions and all were answered. The patient agreed with the plan and demonstrated an understanding of the instructions.   The patient was advised to call back or seek an in-person evaluation if the symptoms worsen or if the condition fails to improve as anticipated.  I provided 25 minutes of non-face-to-face time during this encounter.  Carlos American. Harle Battiest Avs printed and mailed

## 2020-01-12 NOTE — Telephone Encounter (Signed)
[name], Regina Porter are scheduled for a virtual visit with your provider today.    Just as we do with appointments in the office, we must obtain your consent to participate.  Your consent will be active for this visit and any virtual visit you may have with one of our providers in the next 365 days.    If you have a MyChart account, I can also send a copy of this consent to you electronically.  All virtual visits are billed to your insurance company just like a traditional visit in the office.  As this is a virtual visit, video technology does not allow for your provider to perform a traditional examination.  This may limit your provider's ability to fully assess your condition.  If your provider identifies any concerns that need to be evaluated in person or the need to arrange testing such as labs, EKG, etc, we will make arrangements to do so.    Although advances in technology are sophisticated, we cannot ensure that it will always work on either your end or our end.  If the connection with a video visit is poor, we may have to switch to a telephone visit.  With either a video or telephone visit, we are not always able to ensure that we have a secure connection.   I need to obtain your verbal consent now.   Are you willing to proceed with your visit today?   Regina Porter and her daughter [name] has provided verbal consent on 01/12/2020 for a virtual visit (video or telephone).   Leigh Aurora Mier, [place] 01/12/2020  2:46 PM

## 2020-01-20 ENCOUNTER — Telehealth: Payer: Self-pay

## 2020-01-20 NOTE — Telephone Encounter (Signed)
That's the same weight as her appt in Dec I do not see a problem of her traveling

## 2020-01-20 NOTE — Telephone Encounter (Signed)
Regina Porter, daughter called to give her Mom's weight of 161 lbs today. She is scheduled for treatment on 1/13 and will be traveling from out of town. She just wants to make sure the weight will not be a issue before traveling.

## 2020-01-20 NOTE — Telephone Encounter (Signed)
Called back and given below message. She verbalized understanding. 

## 2020-01-24 ENCOUNTER — Other Ambulatory Visit: Payer: Self-pay | Admitting: Family

## 2020-01-24 ENCOUNTER — Other Ambulatory Visit: Payer: Self-pay | Admitting: Nurse Practitioner

## 2020-01-24 DIAGNOSIS — F419 Anxiety disorder, unspecified: Secondary | ICD-10-CM

## 2020-01-24 NOTE — Telephone Encounter (Signed)
Patient was due for a routine follow-up appointment in December 2021.  I called patient, mailbox was full and unable to receive messages at this time

## 2020-01-24 NOTE — Telephone Encounter (Signed)
Duplicate therapy warning came up

## 2020-01-25 ENCOUNTER — Other Ambulatory Visit: Payer: Self-pay | Admitting: Nurse Practitioner

## 2020-01-25 DIAGNOSIS — G3184 Mild cognitive impairment, so stated: Secondary | ICD-10-CM

## 2020-01-27 ENCOUNTER — Inpatient Hospital Stay (HOSPITAL_BASED_OUTPATIENT_CLINIC_OR_DEPARTMENT_OTHER): Payer: Medicare Other | Admitting: Hematology and Oncology

## 2020-01-27 ENCOUNTER — Inpatient Hospital Stay: Payer: Medicare Other | Attending: Hematology and Oncology

## 2020-01-27 ENCOUNTER — Inpatient Hospital Stay: Payer: Medicare Other

## 2020-01-27 ENCOUNTER — Other Ambulatory Visit: Payer: Self-pay | Admitting: Hematology and Oncology

## 2020-01-27 ENCOUNTER — Encounter: Payer: Self-pay | Admitting: Hematology and Oncology

## 2020-01-27 ENCOUNTER — Other Ambulatory Visit: Payer: Self-pay

## 2020-01-27 DIAGNOSIS — N184 Chronic kidney disease, stage 4 (severe): Secondary | ICD-10-CM | POA: Diagnosis not present

## 2020-01-27 DIAGNOSIS — C541 Malignant neoplasm of endometrium: Secondary | ICD-10-CM

## 2020-01-27 DIAGNOSIS — D539 Nutritional anemia, unspecified: Secondary | ICD-10-CM

## 2020-01-27 DIAGNOSIS — Z79899 Other long term (current) drug therapy: Secondary | ICD-10-CM | POA: Diagnosis not present

## 2020-01-27 DIAGNOSIS — C778 Secondary and unspecified malignant neoplasm of lymph nodes of multiple regions: Secondary | ICD-10-CM | POA: Insufficient documentation

## 2020-01-27 DIAGNOSIS — D61818 Other pancytopenia: Secondary | ICD-10-CM

## 2020-01-27 DIAGNOSIS — I10 Essential (primary) hypertension: Secondary | ICD-10-CM | POA: Diagnosis not present

## 2020-01-27 DIAGNOSIS — Z5111 Encounter for antineoplastic chemotherapy: Secondary | ICD-10-CM | POA: Insufficient documentation

## 2020-01-27 LAB — CMP (CANCER CENTER ONLY)
ALT: 11 U/L (ref 0–44)
AST: 19 U/L (ref 15–41)
Albumin: 3.4 g/dL — ABNORMAL LOW (ref 3.5–5.0)
Alkaline Phosphatase: 39 U/L (ref 38–126)
Anion gap: 10 (ref 5–15)
BUN: 31 mg/dL — ABNORMAL HIGH (ref 8–23)
CO2: 23 mmol/L (ref 22–32)
Calcium: 8.8 mg/dL — ABNORMAL LOW (ref 8.9–10.3)
Chloride: 104 mmol/L (ref 98–111)
Creatinine: 1.88 mg/dL — ABNORMAL HIGH (ref 0.44–1.00)
GFR, Estimated: 26 mL/min — ABNORMAL LOW (ref >60)
Glucose, Bld: 213 mg/dL — ABNORMAL HIGH (ref 70–99)
Potassium: 3.9 mmol/L (ref 3.5–5.1)
Sodium: 137 mmol/L (ref 135–145)
Total Bilirubin: 0.3 mg/dL (ref 0.3–1.2)
Total Protein: 7.1 g/dL (ref 6.5–8.1)

## 2020-01-27 LAB — CBC WITH DIFFERENTIAL (CANCER CENTER ONLY)
Abs Immature Granulocytes: 0.01 10*3/uL (ref 0.00–0.07)
Basophils Absolute: 0 10*3/uL (ref 0.0–0.1)
Basophils Relative: 0 %
Eosinophils Absolute: 0 10*3/uL (ref 0.0–0.5)
Eosinophils Relative: 0 %
HCT: 26 % — ABNORMAL LOW (ref 36.0–46.0)
Hemoglobin: 8.7 g/dL — ABNORMAL LOW (ref 12.0–15.0)
Immature Granulocytes: 1 %
Lymphocytes Relative: 32 %
Lymphs Abs: 0.6 10*3/uL — ABNORMAL LOW (ref 0.7–4.0)
MCH: 32.6 pg (ref 26.0–34.0)
MCHC: 33.5 g/dL (ref 30.0–36.0)
MCV: 97.4 fL (ref 80.0–100.0)
Monocytes Absolute: 0.1 10*3/uL (ref 0.1–1.0)
Monocytes Relative: 3 %
Neutro Abs: 1.2 10*3/uL — ABNORMAL LOW (ref 1.7–7.7)
Neutrophils Relative %: 64 %
Platelet Count: 128 10*3/uL — ABNORMAL LOW (ref 150–400)
RBC: 2.67 MIL/uL — ABNORMAL LOW (ref 3.87–5.11)
RDW: 16.3 % — ABNORMAL HIGH (ref 11.5–15.5)
WBC Count: 1.9 10*3/uL — ABNORMAL LOW (ref 4.0–10.5)
nRBC: 0 % (ref 0.0–0.2)

## 2020-01-27 LAB — VITAMIN B12: Vitamin B-12: 400 pg/mL (ref 180–914)

## 2020-01-27 MED ORDER — PALONOSETRON HCL INJECTION 0.25 MG/5ML
INTRAVENOUS | Status: AC
  Filled 2020-01-27: qty 5

## 2020-01-27 MED ORDER — SODIUM CHLORIDE 0.9 % IV SOLN
87.5000 mg/m2 | Freq: Once | INTRAVENOUS | Status: AC
  Administered 2020-01-27: 162 mg via INTRAVENOUS
  Filled 2020-01-27: qty 27

## 2020-01-27 MED ORDER — SODIUM CHLORIDE 0.9% FLUSH
10.0000 mL | INTRAVENOUS | Status: DC | PRN
  Administered 2020-01-27: 10 mL
  Filled 2020-01-27: qty 10

## 2020-01-27 MED ORDER — SODIUM CHLORIDE 0.9 % IV SOLN
150.0000 mg | Freq: Once | INTRAVENOUS | Status: AC
  Administered 2020-01-27: 150 mg via INTRAVENOUS
  Filled 2020-01-27: qty 150

## 2020-01-27 MED ORDER — FAMOTIDINE IN NACL 20-0.9 MG/50ML-% IV SOLN
20.0000 mg | Freq: Once | INTRAVENOUS | Status: AC
  Administered 2020-01-27: 20 mg via INTRAVENOUS

## 2020-01-27 MED ORDER — DIPHENHYDRAMINE HCL 50 MG/ML IJ SOLN
INTRAMUSCULAR | Status: AC
  Filled 2020-01-27: qty 1

## 2020-01-27 MED ORDER — SODIUM CHLORIDE 0.9 % IV SOLN
204.4000 mg | Freq: Once | INTRAVENOUS | Status: AC
  Administered 2020-01-27: 200 mg via INTRAVENOUS
  Filled 2020-01-27: qty 20

## 2020-01-27 MED ORDER — PALONOSETRON HCL INJECTION 0.25 MG/5ML
0.2500 mg | Freq: Once | INTRAVENOUS | Status: AC
  Administered 2020-01-27: 0.25 mg via INTRAVENOUS

## 2020-01-27 MED ORDER — SODIUM CHLORIDE 0.9 % IV SOLN
10.0000 mg | Freq: Once | INTRAVENOUS | Status: AC
  Administered 2020-01-27: 10 mg via INTRAVENOUS
  Filled 2020-01-27: qty 10

## 2020-01-27 MED ORDER — FAMOTIDINE IN NACL 20-0.9 MG/50ML-% IV SOLN
INTRAVENOUS | Status: AC
  Filled 2020-01-27: qty 50

## 2020-01-27 MED ORDER — SODIUM CHLORIDE 0.9 % IV SOLN
Freq: Once | INTRAVENOUS | Status: AC
  Filled 2020-01-27: qty 250

## 2020-01-27 MED ORDER — DIPHENHYDRAMINE HCL 50 MG/ML IJ SOLN
12.5000 mg | Freq: Once | INTRAMUSCULAR | Status: AC
  Administered 2020-01-27: 12.5 mg via INTRAVENOUS

## 2020-01-27 MED ORDER — HEPARIN SOD (PORK) LOCK FLUSH 100 UNIT/ML IV SOLN
500.0000 [IU] | Freq: Once | INTRAVENOUS | Status: AC | PRN
  Administered 2020-01-27: 500 [IU]
  Filled 2020-01-27: qty 5

## 2020-01-27 NOTE — Assessment & Plan Note (Signed)
She has significant fluctuation of her renal function We will proceed with treatment as scheduled  She is instructed to increase oral fluid hydration as tolerated and aggressive risk factor management

## 2020-01-27 NOTE — Assessment & Plan Note (Signed)
She has worsening pancytopenia We will proceed with treatment with caution I plan to reduce the dose of carboplatin further

## 2020-01-27 NOTE — Assessment & Plan Note (Signed)
She continues to tolerate treatment very poorly She has progressive pancytopenia, uncontrolled hypertension, persistent renal failure, and others Despite being with family for additional support, she did not thrive in the environment  The latest situation with her family is her son is now working overseas and not able to take care of her She is currently living with another daughter in [place] and is traveling back and forth for treatment She has not establish care with a new primary doctor Overall, we will proceed with treatment today with dose adjustment given her pancytopenia and renal failure We have extensive discussions about the importance of getting established with a primary care doctor for other health issues

## 2020-01-27 NOTE — Patient Instructions (Signed)
[facility] Discharge Instructions for Patients Receiving Chemotherapy  Today you received the following chemotherapy agents Paclitaxel (TAXOL) & Carboplatin (PARAPLATIN).  To help prevent nausea and vomiting after your treatment, we encourage you to take your nausea medication as prescribed.  If you develop nausea and vomiting that is not controlled by your nausea medication, call the clinic.   BELOW ARE SYMPTOMS THAT SHOULD BE REPORTED IMMEDIATELY:  *FEVER GREATER THAN 100.5 F  *CHILLS WITH OR WITHOUT FEVER  NAUSEA AND VOMITING THAT IS NOT CONTROLLED WITH YOUR NAUSEA MEDICATION  *UNUSUAL SHORTNESS OF BREATH  *UNUSUAL BRUISING OR BLEEDING  TENDERNESS IN MOUTH AND THROAT WITH OR WITHOUT PRESENCE OF ULCERS  *URINARY PROBLEMS  *BOWEL PROBLEMS  UNUSUAL RASH Items with * indicate a potential emergency and should be followed up as soon as possible.  Feel free to call the clinic should you have any questions or concerns. The clinic phone number is [phone].  Please show the CHEMO ALERT CARD at check-in to the Emergency Department and triage nurse.   

## 2020-01-27 NOTE — Progress Notes (Signed)
[facility] OFFICE PROGRESS NOTE  Patient Care Team: Sharon Seller, NP as PCP - General (Geriatric Medicine) Corky Crafts, MD as PCP - Cardiology (Cardiology) Argentina Ponder, MD as Consulting Physician (Obstetrics and Gynecology) Lisbeth Renshaw, MD as Consulting Physician (Neurosurgery) Deterding, Fayrene Fearing, MD (Inactive) as Consulting Physician (Nephrology)  ASSESSMENT & PLAN:  Endometrial cancer [facility]) She continues to tolerate treatment very poorly She has progressive pancytopenia, uncontrolled hypertension, persistent renal failure, and others Despite being with family for additional support, she did not thrive in the environment  The latest situation with her family is her son is now working overseas and not able to take care of her She is currently living with another daughter in [place] and is traveling back and forth for treatment She has not establish care with a new primary doctor Overall, we will proceed with treatment today with dose adjustment given her pancytopenia and renal failure We have extensive discussions about the importance of getting established with a primary care doctor for other health issues  Essential hypertension She continues to have severe, uncontrolled hypertension In our previous visits, I recommend close monitoring of blood pressure and she is not doing that For now, I do not plan to adjust her medications I recommend establish with a new primary doctor for medical management  Pancytopenia, acquired [facility]) She has worsening pancytopenia We will proceed with treatment with caution I plan to reduce the dose of carboplatin further  Chronic kidney disease, stage 4 (severe) (HCC) She has significant fluctuation of her renal function We will proceed with treatment as scheduled  She is instructed to increase oral fluid hydration as tolerated and aggressive risk factor management    No orders of the defined types were  placed in this encounter.   All questions were answered. The patient knows to call the clinic with any problems, questions or concerns. The total time spent in the appointment was 40 minutes encounter with patients including review of chart and various tests results, discussions about plan of care and coordination of care plan   Artis Delay, MD 01/27/2020 10:47 AM  INTERVAL HISTORY: Please see below for problem oriented charting. She returns with another caregiver today Her son who was here previously is no longer taking care of her She moved from [place] to [place] but is traveling back and forth to complete her treatment She denies side effects from therapy but does not know whether she had problems with nausea or constipation She does not have documentation of her blood pressure readings from home but recall that her systolic blood pressure is around 150 Denies peripheral neuropathy No recent infection, fever or chills  SUMMARY OF ONCOLOGIC HISTORY: Oncology History Overview Note  MMR IHC normal, MSI-stable HER2 negative (1+)   History of left breast cancer  Endometrial cancer (HCC)  08/23/2019 Initial Biopsy   D&C A. ENDOMETRIUM, CURETTAGE:  -  High-grade carcinoma  -  See comment  COMMENT:  The biopsy consists of a high-grade carcinoma.  Based on the biopsy the differential diagnosis includes serous, high-grade endometrioid and undifferentiated carcinoma.  These results were discussed with Dr. Sharol Roussel nurse, Olegario Messier, on August 24, 2019.    08/27/2019 Initial Diagnosis   Endometrial cancer (HCC)   09/01/2019 Imaging   CT C/A/P: 1. Enlarged uterus and heterogeneous endometrial cavity, consistent with the given diagnosis of uterine/cervical cancer. Small to borderline enlarged pelvic retroperitoneal lymph nodes are highly worrisome for metastatic disease. 2. Borderline enlarged thoracic inlet and mediastinal lymph  nodes also worrisome for malignancy. 3. Tiny subpleural  nodules are likely benign subpleural lymph nodes. Recommend attention on follow-up. 4. Aortic atherosclerosis (ICD10-I70.0). Coronary artery calcification.   09/09/2019 Surgery   Robotic-assisted laparoscopic total hysterectomy with bilateral salpingoophorectomy, SLN biopsy, right pelvic and para-aortic lymphadenectomy  On EUA, bulky enlarged uterus, mobile. Normal upper abdominal survey although prominent gallbladder and some filmy adhesions between the liver and anterior abdominal wall. Normal appearing omentum, small and large bowel. Uterus quite bulky and 10-12cm. Normal appearing adnexa. Mapping successful to right obturator and left external iliac SLNs. Multiple obturator lymph nodes on the right, an external iliac lymph node on the right, and lymph nodes along the common iliac and aorta on the right all enlarged, adherent to surround vessels, and filled with tumor. All grossly involved lymph nodes removed.   09/09/2019 Pathology Results   A. SENTINEL LYMPH NODE, RIGHT OBTURATOR, ADJACENT ENLARGED LYMPH NODE,  EXCISION:  - Metastatic serous carcinoma, see comment   B. LYMPH NODE, RIGHT EXTERNAL ILIAC, BIOPSY:  - Benign lymph node, negative for carcinoma (0/1)   C. SENTINEL LYMPH NODE, LEFT EXTERNAL ILIAC, BIOPSY:  - Benign lymph node, negative for carcinoma (0/1)   D. 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 UTERUS, CERVIX, BILATERAL TUBE AND OVARIES:  - Invasive high-grade serous carcinoma, 8.2 cm  - Carcinoma invades for depth of 2.0 cm with a myometrial thickness is  2.2 cm  - Carcinoma invades into the cervical stroma  - Extensive lymphovascular invasion is present  - Benign unremarkable bilateral fallopian tubes and ovaries  - See oncology table   F. 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 LYMPH NODES, RIGHT COMMON AND PERIAORTIC, RESECTION:  -  Metastatic serous carcinoma, see comment      COMMENT:   A.   Lymphoid tissue is not identified.  Findings likely represent an  entirely replaced lymph node(s).   G. Multiple fragments of lymph node(s) were received. The exact number  of lymph nodes cannot be determined but does not affect the nodal stage.     ONCOLOGY TABLE:   UTERUS, CARCINOMA OR CARCINOSARCOMA   Procedure: Total hysterectomy and bilateral salpingo-oophorectomy  Histologic type: Serous carcinoma  Histologic Grade: High-grade  Myometrial invasion:       Depth of invasion: 20 mm       Myometrial thickness: 22 mm  Uterine Serosa Involvement: Not identified  Cervical stromal involvement: Present  Extent of involvement of other organs: Not identified  Lymphovascular invasion: Present, extensive  Regional Lymph Nodes:        Examined:        At least 3 Sentinel                               At least 3 Non-sentinel                               At least 6 Total        Lymph nodes with metastasis: 4        Isolated tumor cells (<0.2 mm): 0        Micrometastasis: (>0.2 mm and < 2.0 mm): 0        Macrometastasis: (>2.0 mm): 4  Extracapsular extension: Present  Representative Tumor Block: E8  MMR / MSI testing: Will be ordered  Pathologic Stage Classification (pTNM, AJCC 8th edition):  pT2, pN2a    09/09/2019 Tumor Marker   Patient's tumor was tested for the following markers: CA-125. Results of the tumor marker test revealed: 181.   09/09/2019 Cancer Staging   Staging form: Corpus Uteri - Carcinoma and Carcinosarcoma, AJCC 8th Edition - Clinical stage from 09/09/2019: FIGO Stage IIIC2, calculated as Stage IVB (cT2, cN2a, pM1) - Signed by Lafonda Mosses, MD on 09/15/2019   10/11/2019 Procedure   Successful placement of a power injectable Port-A-Cath via the left internal jugular vein. The catheter is ready for immediate use. Prominent right cervical lymphadenopathy and occlusion of the right internal  jugular vein precluded right-sided placement.       10/21/2019 -  Chemotherapy   The patient had carboplatin and taxol for chemotherapy treatment.       REVIEW OF SYSTEMS:   Constitutional: Denies fevers, chills or abnormal weight loss Eyes: Denies blurriness of vision Ears, nose, mouth, throat, and face: Denies mucositis or sore throat Respiratory: Denies cough, dyspnea or wheezes Cardiovascular: Denies palpitation, chest discomfort or lower extremity swelling Gastrointestinal:  Denies nausea, heartburn or change in bowel habits Skin: Denies abnormal skin rashes Lymphatics: Denies new lymphadenopathy or easy bruising Neurological:Denies numbness, tingling or new weaknesses Behavioral/Psych: Mood is stable, no new changes  All other systems were reviewed with the patient and are negative.  I have reviewed the past medical history, past surgical history, social history and family history with the patient and they are unchanged from previous note.  ALLERGIES:  is allergic to shellfish allergy and neomy-bacit-polymyx-pramoxine.  MEDICATIONS:  Current Outpatient Medications  Medication Sig Dispense Refill  . acetaminophen (TYLENOL) 500 MG tablet Take 1,000 mg by mouth every 6 (six) hours as needed for moderate pain.     Marland Kitchen amLODipine (NORVASC) 10 MG tablet TAKE 1 TABLET BY MOUTH EVERY DAY 90 tablet 0  . aspirin 81 MG EC tablet Take 81 mg by mouth daily.     . bumetanide (BUMEX) 2 MG tablet Take 2 mg by mouth daily.  6  . busPIRone (BUSPAR) 10 MG tablet Take 1 by mouth twice daily .DX F41.9 APPT IS OVERDUE 60 tablet 0  . Carboxymethylcellulose Sodium (EYE DROPS OP) Place 1 drop into both eyes daily as needed (dry eyes).     Marland Kitchen Cod Liver Oil 1000 MG CAPS Take 1,000 mg by mouth 2 (two) times daily.     Marland Kitchen dexamethasone (DECADRON) 4 MG tablet Take 2 tabs at the night before and 2 tab the morning of chemotherapy, every 3 weeks, by mouth x 6 cycles 8 tablet 6  . donepezil (ARICEPT) 5 MG  tablet TAKE 1 TAB BY MOUTH AT BEDTIME 90 tablet 0  . hydrALAZINE (APRESOLINE) 100 MG tablet TAKE 1 TABLET (100 MG TOTAL) BY MOUTH 3 (THREE) TIMES DAILY. 270 tablet 0  . levothyroxine (SYNTHROID) 75 MCG tablet Take one tablet by mouth once daily 30 minutes before breakfast for thyroid 90 tablet 0  . lidocaine-prilocaine (EMLA) cream Apply to affected area once 30 g 3  . losartan (COZAAR) 100 MG tablet Take 1 by mouth daily at bedtime APPOINTMENT OVER DUE 30 tablet 0  . metoprolol succinate (TOPROL-XL) 100 MG 24 hr tablet Take 1 tablet (100 mg total) by mouth in the morning and at bedtime. Take with or immediately following a meal. 180 tablet 0  . Multiple  Vitamin (MULTIVITAMIN ADULT PO) Take 1 tablet by mouth daily.    . ondansetron (ZOFRAN) 8 MG tablet Take 1 tablet (8 mg total) by mouth every 8 (eight) hours as needed. Start on the third day after chemotherapy but only take if needed 30 tablet 1  . prochlorperazine (COMPAZINE) 10 MG tablet Take 1 tablet (10 mg total) by mouth every 6 (six) hours as needed (Nausea or vomiting). 30 tablet 1  . senna-docusate (SENOKOT-S) 8.6-50 MG tablet Take 2 tablets by mouth at bedtime. For AFTER surgery, do not take if having diarrhea 30 tablet 0  . traZODone (DESYREL) 50 MG tablet Take one tablet by mouth once daily at bedtime 90 tablet 0   No current facility-administered medications for this visit.    PHYSICAL EXAMINATION: ECOG PERFORMANCE STATUS: 2 - Symptomatic, <50% confined to bed  Vitals:   01/27/20 1014  BP: (!) 190/53  Pulse: 76  Resp: 18  Temp: (!) 97.5 F (36.4 C)  SpO2: 100%   Filed Weights   01/27/20 1014  Weight: 161 lb 9.6 oz (73.3 kg)    GENERAL:alert, no distress and comfortable SKIN: skin color, texture, turgor are normal, no rashes or significant lesions EYES: normal, Conjunctiva are pink and non-injected, sclera clear OROPHARYNX:no exudate, no erythema and lips, buccal mucosa, and tongue normal  NECK: supple, thyroid normal  size, non-tender, without nodularity LYMPH:  no palpable lymphadenopathy in the cervical, axillary or inguinal LUNGS: clear to auscultation and percussion with normal breathing effort HEART: regular rate & rhythm and no murmurs and no lower extremity edema ABDOMEN:abdomen soft, non-tender and normal bowel sounds Musculoskeletal:no cyanosis of digits and no clubbing  NEURO: alert & oriented x 3 with fluent speech, no focal motor/sensory deficits  LABORATORY DATA:  I have reviewed the data as listed    Component Value Date/Time   NA 137 01/27/2020 0947   K 3.9 01/27/2020 0947   CL 104 01/27/2020 0947   CO2 23 01/27/2020 0947   GLUCOSE 213 (H) 01/27/2020 0947   BUN 31 (H) 01/27/2020 0947   CREATININE 1.88 (H) 01/27/2020 0947   CREATININE 1.78 (H) 09/29/2019 1100   CALCIUM 8.8 (L) 01/27/2020 0947   PROT 7.1 01/27/2020 0947   ALBUMIN 3.4 (L) 01/27/2020 0947   AST 19 01/27/2020 0947   ALT 11 01/27/2020 0947   ALKPHOS 39 01/27/2020 0947   BILITOT 0.3 01/27/2020 0947   GFRNONAA 26 (L) 01/27/2020 0947   GFRNONAA 26 (L) 09/29/2019 1100   GFRAA 15 (L) 10/13/2019 1312   GFRAA 30 (L) 09/29/2019 1100    No results found for: SPEP, UPEP  Lab Results  Component Value Date   WBC 1.9 (L) 01/27/2020   NEUTROABS 1.2 (L) 01/27/2020   HGB 8.7 (L) 01/27/2020   HCT 26.0 (L) 01/27/2020   MCV 97.4 01/27/2020   PLT 128 (L) 01/27/2020      Chemistry      Component Value Date/Time   NA 137 01/27/2020 0947   K 3.9 01/27/2020 0947   CL 104 01/27/2020 0947   CO2 23 01/27/2020 0947   BUN 31 (H) 01/27/2020 0947   CREATININE 1.88 (H) 01/27/2020 0947   CREATININE 1.78 (H) 09/29/2019 1100      Component Value Date/Time   CALCIUM 8.8 (L) 01/27/2020 0947   ALKPHOS 39 01/27/2020 0947   AST 19 01/27/2020 0947   ALT 11 01/27/2020 0947   BILITOT 0.3 01/27/2020 0947

## 2020-01-27 NOTE — Assessment & Plan Note (Signed)
She continues to have severe, uncontrolled hypertension In our previous visits, I recommend close monitoring of blood pressure and she is not doing that For now, I do not plan to adjust her medications I recommend establish with a new primary doctor for medical management

## 2020-01-30 IMAGING — CR DG CHEST 2V
2 series · 2 of 2 positions shown · non-contrast
Comparison: Chest CT 05/01/2015

CLINICAL DATA: Preop testing. Pt is scheduled to have surgery on
her L-spine on [REDACTED] 06/04/17. Hx; breast cancer, HTN, heartburn,
breast surgery-lumpectomy (left), former smoker of .1 packs/day for
20 years, pt states she quit 6 or 7 years ago ([id number] or [id number])

EXAM:
CHEST - 2 VIEW

[w chest pa]
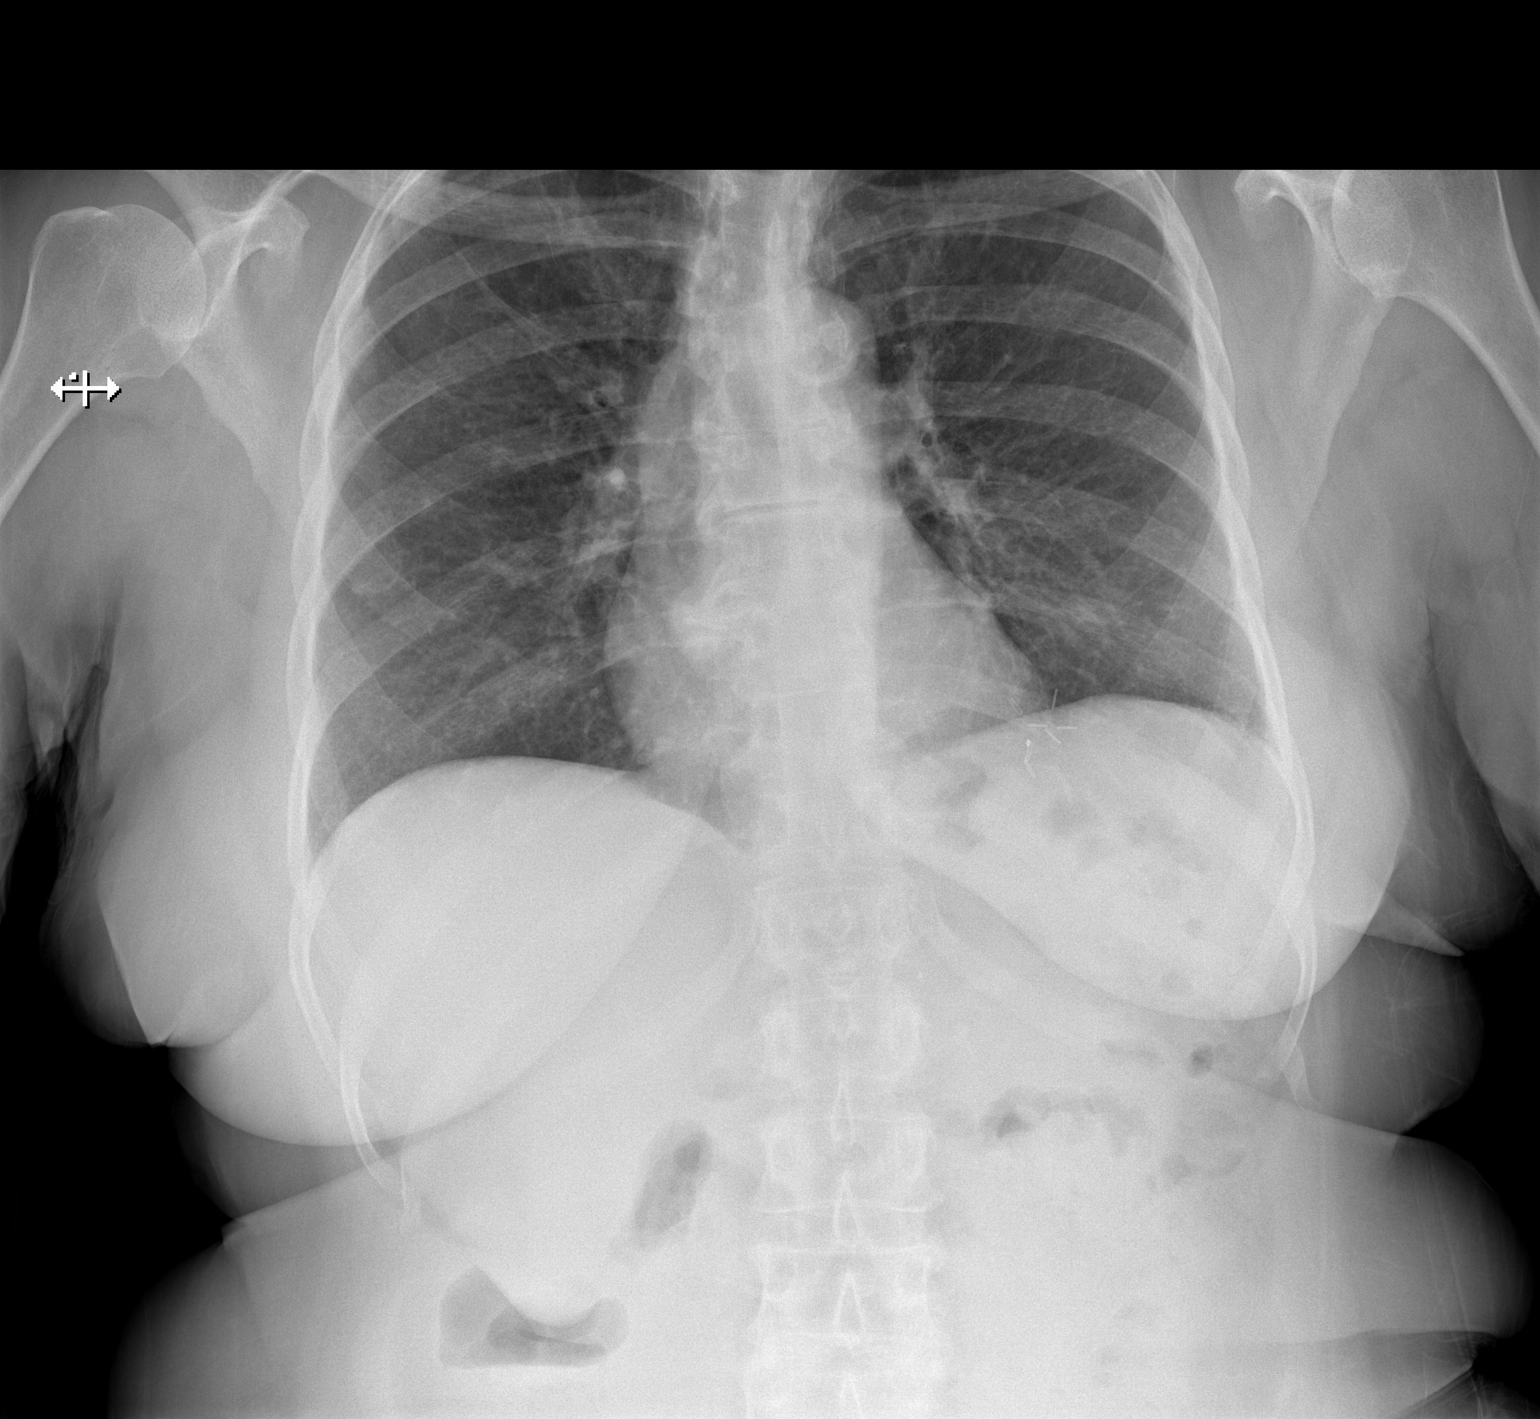

[w chest lat]
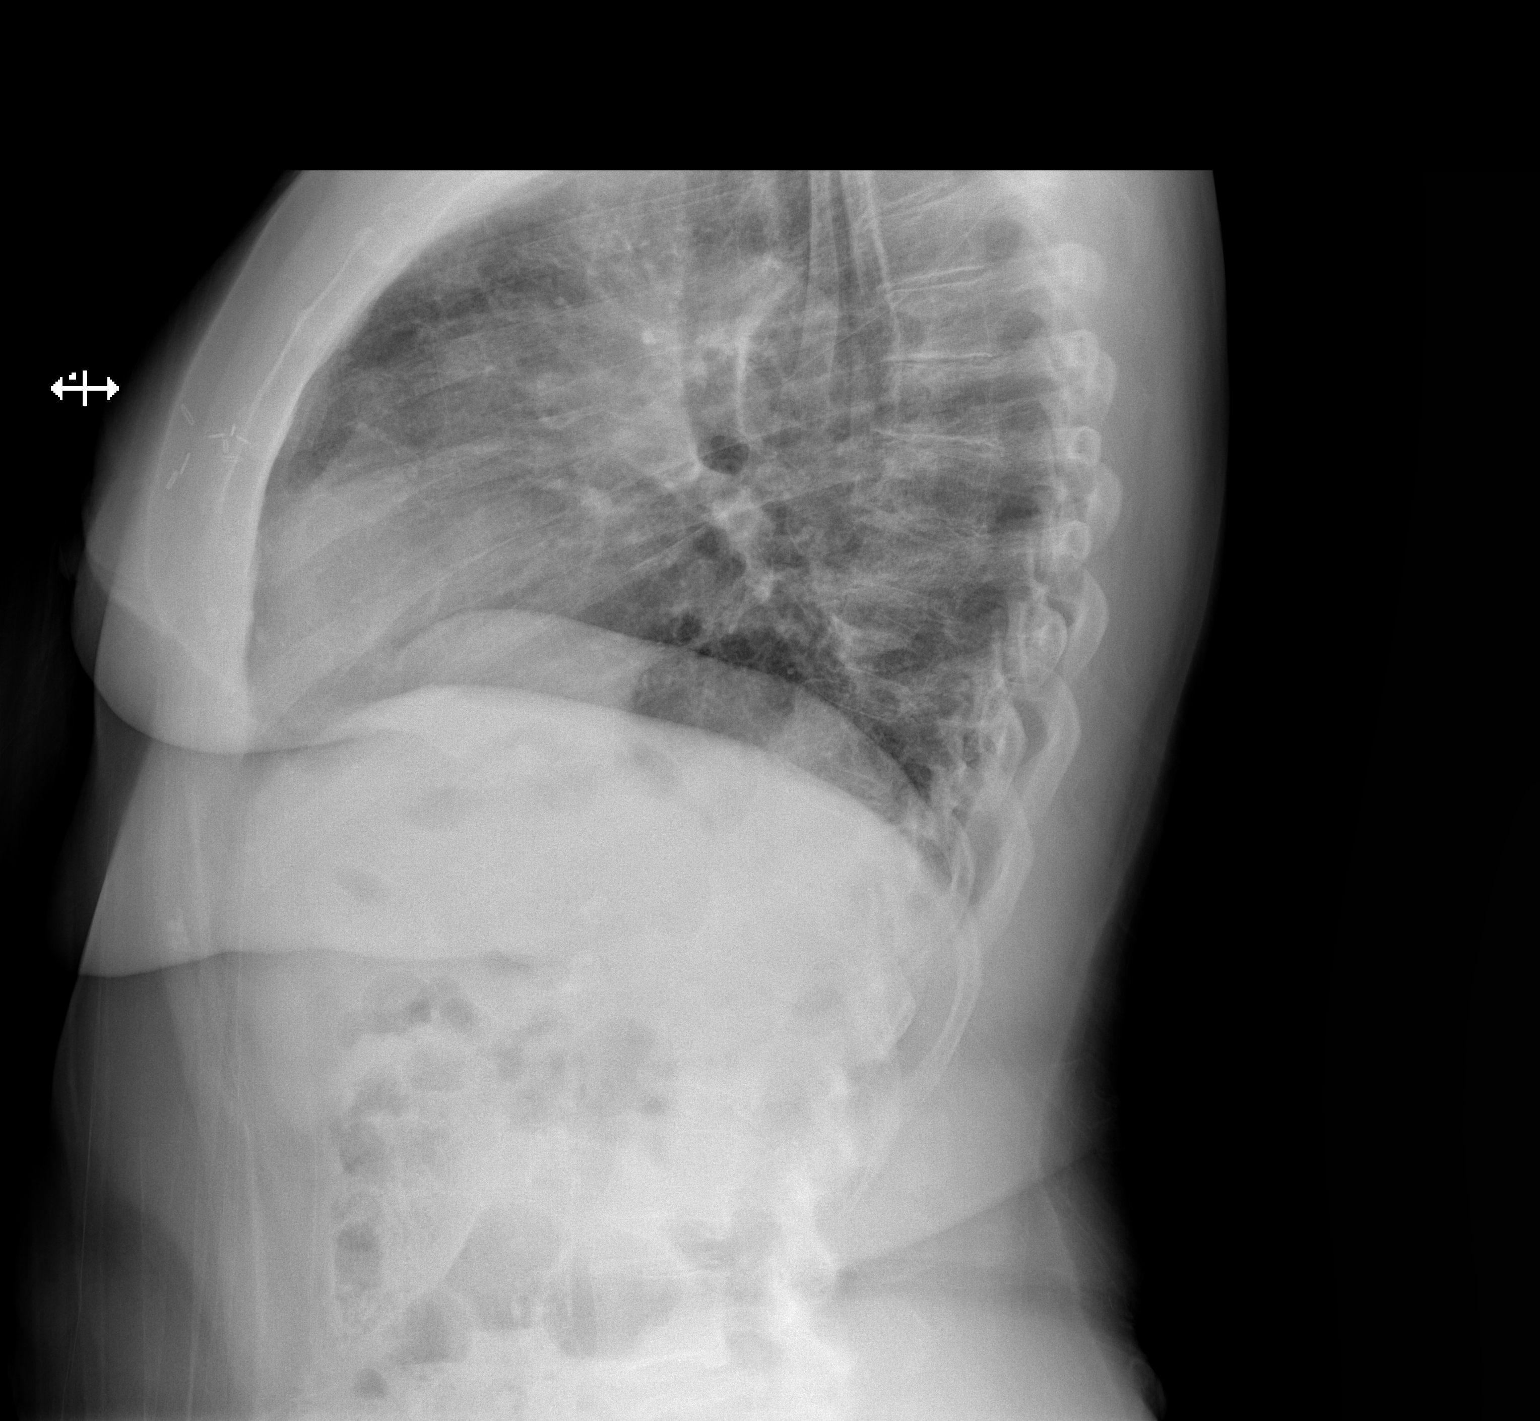

[2 of 2 positions shown; findings below may reference images not displayed]

FINDINGS: Normal mediastinum and cardiac silhouette. Normal pulmonary
vasculature. No evidence of effusion, infiltrate, or pneumothorax.
No acute bony abnormality. Surgical clips in the LEFT breast.
IMPRESSION: No active cardiopulmonary disease.

## 2020-02-02 ENCOUNTER — Telehealth: Payer: Self-pay | Admitting: Oncology

## 2020-02-02 NOTE — Telephone Encounter (Signed)
Regina Porter (daughter) called with blood pressure readings for the past few days:  Sunday - 189/78 HR 77 Monday - 180/78 HR 74 Tuesday - 167/77 HR 75 Today - 139/67 HR 74  She asked if Dr. Alvy Bimler is planning on changing her medications.  Advised her that per Dr. Calton Dach note from 01/27/20 that they should establish care with a primary care doctor for medical management and that I would notify Dr. Alvy Bimler of the readings.

## 2020-02-03 NOTE — Telephone Encounter (Signed)
I will not be managing her BP from far

## 2020-02-04 DIAGNOSIS — C775 Secondary and unspecified malignant neoplasm of intrapelvic lymph nodes: Secondary | ICD-10-CM | POA: Diagnosis not present

## 2020-02-04 DIAGNOSIS — C541 Malignant neoplasm of endometrium: Secondary | ICD-10-CM | POA: Diagnosis not present

## 2020-02-04 DIAGNOSIS — C538 Malignant neoplasm of overlapping sites of cervix uteri: Secondary | ICD-10-CM | POA: Diagnosis not present

## 2020-02-04 IMAGING — RF DG C-ARM 61-120 MIN
1 series · 2 of 2 positions shown · non-contrast
Comparison: 06/25/2016

CLINICAL DATA: Lumbar fusion

EXAM:
LUMBAR SPINE - 2-3 VIEW; DG C-ARM 61-120 MIN

[Series 1: run · 2 of 2 slices shown]
[im 1/2]
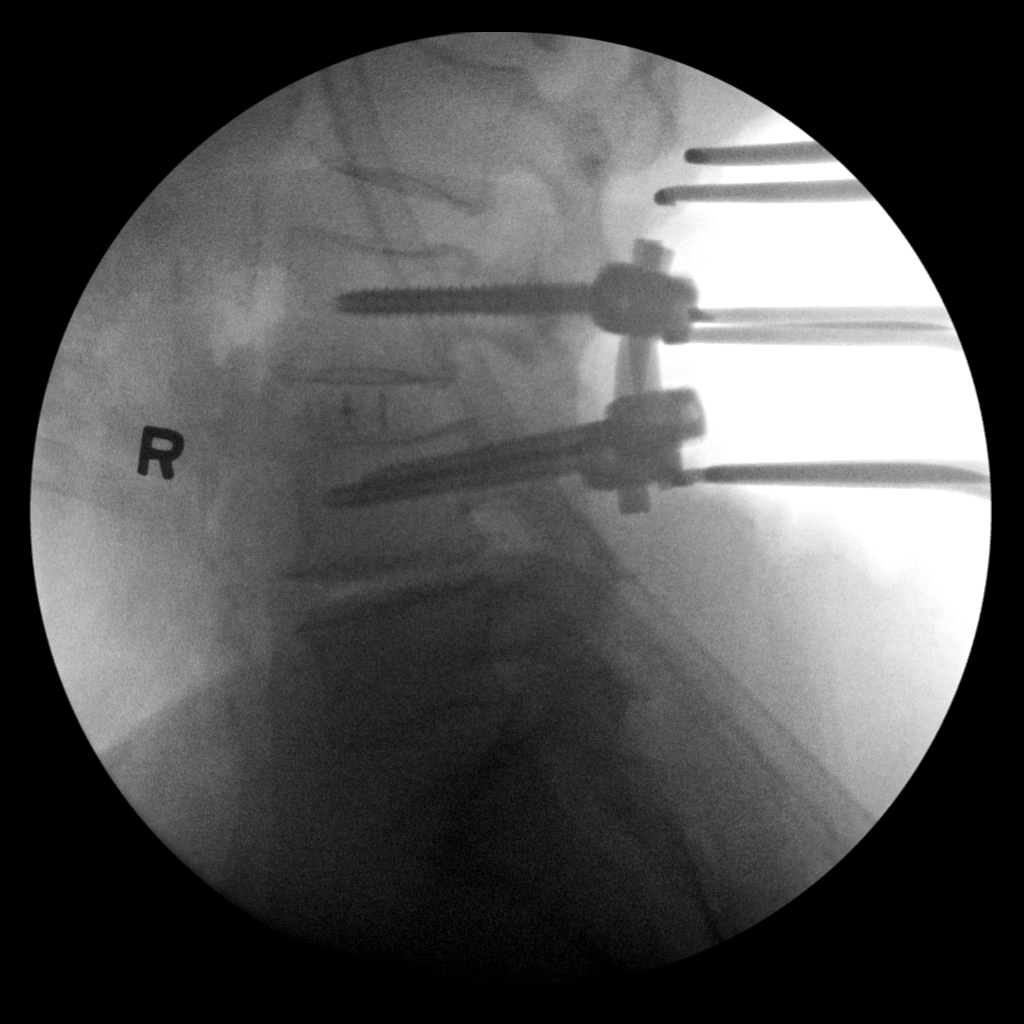
[im 2/2]
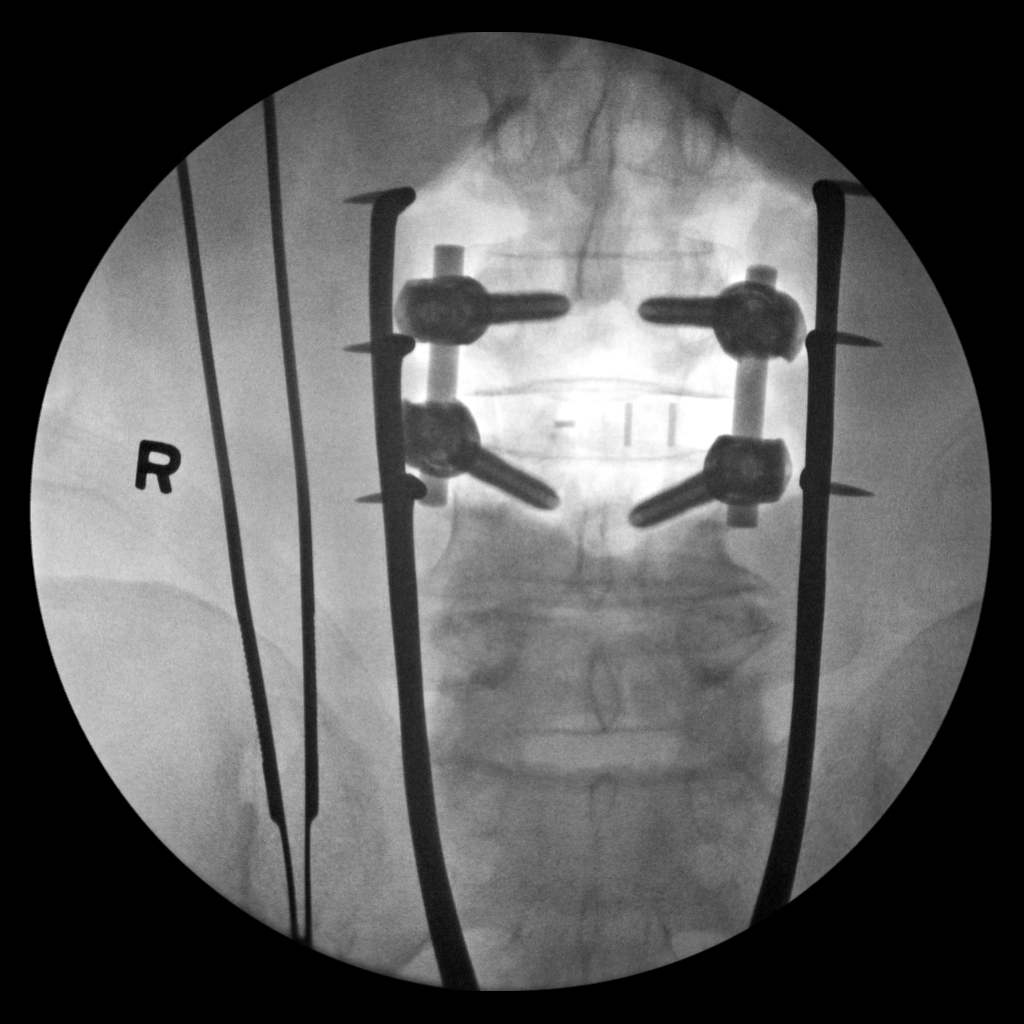

[2 of 2 positions shown; findings below may reference images not displayed]

FLUOROSCOPY TIME:  Fluoroscopy Time:  24 seconds

Radiation Exposure Index (if provided by the fluoroscopic device):
Not available

Number of Acquired Spot Images: 2
FINDINGS: Pedicle screws are noted at L3 and L4 with interbody fusion and
posterior fixation. The numbering nomenclature is similar to that
utilized on prior MRI.
IMPRESSION: Lumbar fusion.

## 2020-02-05 ENCOUNTER — Other Ambulatory Visit: Payer: Self-pay | Admitting: Nurse Practitioner

## 2020-02-07 NOTE — Telephone Encounter (Signed)
Left message on voicemail for patients daughter Elder Love to return call when available    Reason for call: Confirm if patient moved to [place] and is under the care of a provider there.

## 2020-02-09 ENCOUNTER — Telehealth: Payer: Self-pay | Admitting: Oncology

## 2020-02-09 NOTE — Telephone Encounter (Signed)
Regina Porter (daughter) called and asked what treatment if any Regina Porter will need after she finishes chemo on 02/24/20.  She said Regina Porter is going to stay with her in [place] and not go to [place].  Advised her to discuss this with Dr. Alvy Bimler at her next appointment. Also discussed that she should look for a cancer center in [place] in case Regina Porter would need more treatment or follow up and to let us know if we need to send her medical records.

## 2020-02-17 ENCOUNTER — Encounter: Payer: Self-pay | Admitting: Oncology

## 2020-02-18 ENCOUNTER — Other Ambulatory Visit: Payer: Self-pay | Admitting: Nurse Practitioner

## 2020-02-18 DIAGNOSIS — F419 Anxiety disorder, unspecified: Secondary | ICD-10-CM

## 2020-02-22 ENCOUNTER — Other Ambulatory Visit: Payer: Self-pay | Admitting: Nurse Practitioner

## 2020-02-22 DIAGNOSIS — F419 Anxiety disorder, unspecified: Secondary | ICD-10-CM

## 2020-02-23 ENCOUNTER — Telehealth: Payer: Self-pay | Admitting: Hematology and Oncology

## 2020-02-23 NOTE — Telephone Encounter (Signed)
[id number] Faxed medical records to [facility] @ fax#[phone]

## 2020-02-24 ENCOUNTER — Inpatient Hospital Stay: Payer: Medicare Other | Attending: Hematology and Oncology

## 2020-02-24 ENCOUNTER — Inpatient Hospital Stay: Payer: Medicare Other

## 2020-02-24 ENCOUNTER — Telehealth: Payer: Self-pay | Admitting: Hematology and Oncology

## 2020-02-24 ENCOUNTER — Other Ambulatory Visit: Payer: Self-pay

## 2020-02-24 ENCOUNTER — Inpatient Hospital Stay (HOSPITAL_BASED_OUTPATIENT_CLINIC_OR_DEPARTMENT_OTHER): Payer: Medicare Other | Admitting: Hematology and Oncology

## 2020-02-24 VITALS — BP 155/44 | HR 63 | Temp 98.2°F | Resp 18 | Ht 65.0 in | Wt 157.0 lb

## 2020-02-24 DIAGNOSIS — D61818 Other pancytopenia: Secondary | ICD-10-CM

## 2020-02-24 DIAGNOSIS — Z5111 Encounter for antineoplastic chemotherapy: Secondary | ICD-10-CM | POA: Diagnosis not present

## 2020-02-24 DIAGNOSIS — C541 Malignant neoplasm of endometrium: Secondary | ICD-10-CM | POA: Diagnosis not present

## 2020-02-24 DIAGNOSIS — R634 Abnormal weight loss: Secondary | ICD-10-CM

## 2020-02-24 DIAGNOSIS — Z79899 Other long term (current) drug therapy: Secondary | ICD-10-CM | POA: Insufficient documentation

## 2020-02-24 DIAGNOSIS — N184 Chronic kidney disease, stage 4 (severe): Secondary | ICD-10-CM

## 2020-02-24 DIAGNOSIS — Z23 Encounter for immunization: Secondary | ICD-10-CM | POA: Diagnosis not present

## 2020-02-24 DIAGNOSIS — I10 Essential (primary) hypertension: Secondary | ICD-10-CM

## 2020-02-24 LAB — CMP (CANCER CENTER ONLY)
ALT: 12 U/L (ref 0–44)
AST: 18 U/L (ref 15–41)
Albumin: 3.5 g/dL (ref 3.5–5.0)
Alkaline Phosphatase: 37 U/L — ABNORMAL LOW (ref 38–126)
Anion gap: 13 (ref 5–15)
BUN: 32 mg/dL — ABNORMAL HIGH (ref 8–23)
CO2: 21 mmol/L — ABNORMAL LOW (ref 22–32)
Calcium: 9.1 mg/dL (ref 8.9–10.3)
Chloride: 105 mmol/L (ref 98–111)
Creatinine: 1.99 mg/dL — ABNORMAL HIGH (ref 0.44–1.00)
GFR, Estimated: 24 mL/min — ABNORMAL LOW (ref >60)
Glucose, Bld: 197 mg/dL — ABNORMAL HIGH (ref 70–99)
Potassium: 3.8 mmol/L (ref 3.5–5.1)
Sodium: 139 mmol/L (ref 135–145)
Total Bilirubin: 0.3 mg/dL (ref 0.3–1.2)
Total Protein: 7 g/dL (ref 6.5–8.1)

## 2020-02-24 LAB — CBC WITH DIFFERENTIAL (CANCER CENTER ONLY)
Abs Immature Granulocytes: 0.01 10*3/uL (ref 0.00–0.07)
Basophils Absolute: 0 10*3/uL (ref 0.0–0.1)
Basophils Relative: 0 %
Eosinophils Absolute: 0 10*3/uL (ref 0.0–0.5)
Eosinophils Relative: 0 %
HCT: 25.6 % — ABNORMAL LOW (ref 36.0–46.0)
Hemoglobin: 8.8 g/dL — ABNORMAL LOW (ref 12.0–15.0)
Immature Granulocytes: 0 %
Lymphocytes Relative: 30 %
Lymphs Abs: 0.8 10*3/uL (ref 0.7–4.0)
MCH: 33.5 pg (ref 26.0–34.0)
MCHC: 34.4 g/dL (ref 30.0–36.0)
MCV: 97.3 fL (ref 80.0–100.0)
Monocytes Absolute: 0.1 10*3/uL (ref 0.1–1.0)
Monocytes Relative: 4 %
Neutro Abs: 1.7 10*3/uL (ref 1.7–7.7)
Neutrophils Relative %: 66 %
Platelet Count: 108 10*3/uL — ABNORMAL LOW (ref 150–400)
RBC: 2.63 MIL/uL — ABNORMAL LOW (ref 3.87–5.11)
RDW: 14.3 % (ref 11.5–15.5)
WBC Count: 2.6 10*3/uL — ABNORMAL LOW (ref 4.0–10.5)
nRBC: 0 % (ref 0.0–0.2)

## 2020-02-24 MED ORDER — FAMOTIDINE IN NACL 20-0.9 MG/50ML-% IV SOLN
20.0000 mg | Freq: Once | INTRAVENOUS | Status: AC
  Administered 2020-02-24: 20 mg via INTRAVENOUS

## 2020-02-24 MED ORDER — SODIUM CHLORIDE 0.9 % IV SOLN
Freq: Once | INTRAVENOUS | Status: AC
  Filled 2020-02-24: qty 250

## 2020-02-24 MED ORDER — FAMOTIDINE IN NACL 20-0.9 MG/50ML-% IV SOLN
INTRAVENOUS | Status: AC
  Filled 2020-02-24: qty 50

## 2020-02-24 MED ORDER — DIPHENHYDRAMINE HCL 50 MG/ML IJ SOLN
INTRAMUSCULAR | Status: AC
  Filled 2020-02-24: qty 1

## 2020-02-24 MED ORDER — INFLUENZA VAC A&B SA ADJ QUAD 0.5 ML IM PRSY
0.5000 mL | PREFILLED_SYRINGE | Freq: Once | INTRAMUSCULAR | Status: AC
  Administered 2020-02-24: 0.5 mL via INTRAMUSCULAR

## 2020-02-24 MED ORDER — SODIUM CHLORIDE 0.9 % IV SOLN
87.5000 mg/m2 | Freq: Once | INTRAVENOUS | Status: AC
  Administered 2020-02-24: 156 mg via INTRAVENOUS
  Filled 2020-02-24: qty 26

## 2020-02-24 MED ORDER — PALONOSETRON HCL INJECTION 0.25 MG/5ML
0.2500 mg | Freq: Once | INTRAVENOUS | Status: AC
  Administered 2020-02-24: 0.25 mg via INTRAVENOUS

## 2020-02-24 MED ORDER — DIPHENHYDRAMINE HCL 50 MG/ML IJ SOLN
12.5000 mg | Freq: Once | INTRAMUSCULAR | Status: AC
  Administered 2020-02-24: 12.5 mg via INTRAVENOUS

## 2020-02-24 MED ORDER — SODIUM CHLORIDE 0.9 % IV SOLN
150.0000 mg | Freq: Once | INTRAVENOUS | Status: AC
  Administered 2020-02-24: 150 mg via INTRAVENOUS
  Filled 2020-02-24: qty 150

## 2020-02-24 MED ORDER — SODIUM CHLORIDE 0.9 % IV SOLN
196.4000 mg | Freq: Once | INTRAVENOUS | Status: AC
  Administered 2020-02-24: 200 mg via INTRAVENOUS
  Filled 2020-02-24: qty 20

## 2020-02-24 MED ORDER — INFLUENZA VAC A&B SA ADJ QUAD 0.5 ML IM PRSY
PREFILLED_SYRINGE | INTRAMUSCULAR | Status: AC
  Filled 2020-02-24: qty 0.5

## 2020-02-24 MED ORDER — DIPHENHYDRAMINE HCL 12.5 MG/5ML PO ELIX
ORAL_SOLUTION | ORAL | Status: AC
  Filled 2020-02-24: qty 5

## 2020-02-24 MED ORDER — HEPARIN SOD (PORK) LOCK FLUSH 100 UNIT/ML IV SOLN
500.0000 [IU] | Freq: Once | INTRAVENOUS | Status: AC | PRN
  Administered 2020-02-24: 500 [IU]
  Filled 2020-02-24: qty 5

## 2020-02-24 MED ORDER — SODIUM CHLORIDE 0.9 % IV SOLN
10.0000 mg | Freq: Once | INTRAVENOUS | Status: AC
  Administered 2020-02-24: 10 mg via INTRAVENOUS
  Filled 2020-02-24: qty 10

## 2020-02-24 MED ORDER — PALONOSETRON HCL INJECTION 0.25 MG/5ML
INTRAVENOUS | Status: AC
  Filled 2020-02-24: qty 5

## 2020-02-24 MED ORDER — SODIUM CHLORIDE 0.9% FLUSH
10.0000 mL | INTRAVENOUS | Status: DC | PRN
Start: 2020-02-24 — End: 2020-02-24
  Administered 2020-02-24: 10 mL
  Filled 2020-02-24: qty 10

## 2020-02-24 MED ORDER — SODIUM CHLORIDE 0.9% FLUSH
10.0000 mL | Freq: Once | INTRAVENOUS | Status: AC
  Administered 2020-02-24: 10 mL
  Filled 2020-02-24: qty 10

## 2020-02-24 NOTE — Progress Notes (Signed)
[place] OFFICE PROGRESS NOTE  Patient Care Team: [name], NP as PCP - General (Geriatric Medicine) Jettie Booze, MD as PCP - Cardiology (Cardiology) Esperanza Heir, MD as Consulting Physician (Obstetrics and Gynecology) Consuella Lose, MD as Consulting Physician (Neurosurgery) Deterding, Jeneen Rinks, MD as Consulting Physician (Nephrology)  ASSESSMENT & PLAN:  Endometrial cancer [facility]) She continues to tolerate treatment very poorly She has progressive pancytopenia, uncontrolled hypertension, persistent renal failure, and others Despite being with family for additional support, she did not thrive in the environment  She is currently living with another daughter in [place] and is traveling back and forth for treatment She has not establish care with a new primary doctor Overall, we will proceed with treatment today with dose adjustment given her pancytopenia and renal failure We have extensive discussions about the importance of getting established with a primary care doctor for other health issues We discussed future follow-up after completion of chemotherapy I would like to order CT imaging in 1 month for baseline assessment We discussed briefly the risk and benefits of adjuvant radiation treatment Initially, her daughter felt it is best for her to get her imaging study done in [place] but at the end of the visit, changed her mind and would like to CT imaging done here I will schedule return appointment next month for further follow-up  Pancytopenia, acquired [facility]) She has stable pancytopenia We will proceed with treatment with caution I plan to reduce the dose of carboplatin further along with Taxol due to recent weight loss  Weight loss She had recent progressive weight loss I will adjust the dose of her treatment  Chronic kidney disease, stage 4 (severe) ([place]) She has significant fluctuation of her renal function We will proceed with  treatment as scheduled  She is instructed to increase oral fluid hydration as tolerated and aggressive risk factor management   Essential hypertension She continues to have severe, uncontrolled hypertension In our previous visits, I recommend close monitoring of blood pressure  For now, I do not plan to adjust her medications I recommend establish with a new primary doctor for medical management   Orders Placed This Encounter  Procedures  . CT Chest Wo Contrast    Standing Status:   Future    Standing Expiration Date:   02/23/2021    Order Specific Question:   Preferred imaging location?    Answer:   [facility]  . CT Abdomen Pelvis Wo Contrast    Standing Status:   Future    Standing Expiration Date:   02/23/2021    Order Specific Question:   Preferred imaging location?    Answer:   [facility]    Order Specific Question:   Is Oral Contrast requested for this exam?    Answer:   Yes, Per Radiology protocol    All questions were answered. The patient knows to call the clinic with any problems, questions or concerns. The total time spent in the appointment was 40 minutes encounter with patients including review of chart and various tests results, discussions about plan of care and coordination of care plan   Heath Lark, MD 02/24/2020 1:09 PM  INTERVAL HISTORY: Please see below for problem oriented charting. She returns with daughter for further follow-up Since last time I saw her, she has lost more weight Her blood pressure is still elevated but slightly better than before She denies nausea Unable to determine constipation The patient felt that her oral intake is  fair No peripheral neuropathy from treatment The patient denies any recent signs or symptoms of bleeding such as spontaneous epistaxis, hematuria or hematochezia.   SUMMARY OF ONCOLOGIC HISTORY: Oncology History Overview Note  MMR IHC normal, MSI-stable HER2 negative (1+)   History of left breast  cancer  Endometrial cancer ([place])  08/23/2019 Initial Biopsy   D&C A. ENDOMETRIUM, CURETTAGE:  -  High-grade carcinoma  -  See comment  COMMENT:  The biopsy consists of a high-grade carcinoma.  Based on the biopsy the differential diagnosis includes serous, high-grade endometrioid and undifferentiated carcinoma.  These results were discussed with Dr. Assunta Curtis nurse, Juliann Pulse, on August 24, 2019.    08/27/2019 Initial Diagnosis   Endometrial cancer ([place])   09/01/2019 Imaging   CT C/A/P: 1. Enlarged uterus and heterogeneous endometrial cavity, consistent with the given diagnosis of uterine/cervical cancer. Small to borderline enlarged pelvic retroperitoneal lymph nodes are highly worrisome for metastatic disease. 2. Borderline enlarged thoracic inlet and mediastinal lymph nodes also worrisome for malignancy. 3. Tiny subpleural nodules are likely benign subpleural lymph nodes. Recommend attention on follow-up. 4. Aortic atherosclerosis (ICD10-I70.0). Coronary artery calcification.   09/09/2019 Surgery   Robotic-assisted laparoscopic total hysterectomy with bilateral salpingoophorectomy, SLN biopsy, right pelvic and para-aortic lymphadenectomy  On EUA, bulky enlarged uterus, mobile. Normal upper abdominal survey although prominent gallbladder and some filmy adhesions between the liver and anterior abdominal wall. Normal appearing omentum, small and large bowel. Uterus quite bulky and 10-12cm. Normal appearing adnexa. Mapping successful to right obturator and left external iliac SLNs. Multiple obturator lymph nodes on the right, an external iliac lymph node on the right, and lymph nodes along the common iliac and aorta on the right all enlarged, adherent to surround vessels, and filled with tumor. All grossly involved lymph nodes removed.   09/09/2019 Pathology Results   A. SENTINEL LYMPH NODE, RIGHT OBTURATOR, ADJACENT ENLARGED LYMPH NODE,  EXCISION:  - Metastatic serous carcinoma, see comment    B. LYMPH NODE, RIGHT EXTERNAL ILIAC, BIOPSY:  - Benign lymph node, negative for carcinoma (0/1)   C. SENTINEL LYMPH NODE, LEFT EXTERNAL ILIAC, BIOPSY:  - Benign lymph node, negative for carcinoma (0/1)   D. 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 UTERUS, CERVIX, BILATERAL TUBE AND OVARIES:  - Invasive high-grade serous carcinoma, 8.2 cm  - Carcinoma invades for depth of 2.0 cm with a myometrial thickness is  2.2 cm  - Carcinoma invades into the cervical stroma  - Extensive lymphovascular invasion is present  - Benign unremarkable bilateral fallopian tubes and ovaries  - See oncology table   F. 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 LYMPH NODES, RIGHT COMMON AND PERIAORTIC, RESECTION:  - Metastatic serous carcinoma, see comment      COMMENT:   A.   Lymphoid tissue is not identified.  Findings likely represent an  entirely replaced lymph node(s).   G. Multiple fragments of lymph node(s) were received. The exact number  of lymph nodes cannot be determined but does not affect the nodal stage.     ONCOLOGY TABLE:   UTERUS, CARCINOMA OR CARCINOSARCOMA   Procedure: Total hysterectomy and bilateral salpingo-oophorectomy  Histologic type: Serous carcinoma  Histologic Grade: High-grade  Myometrial invasion:       Depth of invasion: 20 mm       Myometrial thickness: 22 mm  Uterine Serosa Involvement: Not  identified  Cervical stromal involvement: Present  Extent of involvement of other organs: Not identified  Lymphovascular invasion: Present, extensive  Regional Lymph Nodes:        Examined:        At least 3 Sentinel                               At least 3 Non-sentinel                               At least 6 Total        Lymph nodes with metastasis: 4        Isolated tumor cells (<0.2 mm): 0        Micrometastasis: (>0.2 mm and < 2.0 mm): 0         Macrometastasis: (>2.0 mm): 4        Extracapsular extension: Present  Representative Tumor Block: E8  MMR / MSI testing: Will be ordered  Pathologic Stage Classification (pTNM, AJCC 8th edition):  pT2, pN2a    09/09/2019 Tumor Marker   Patient's tumor was tested for the following markers: CA-125. Results of the tumor marker test revealed: 181.   09/09/2019 Cancer Staging   Staging form: Corpus Uteri - Carcinoma and Carcinosarcoma, AJCC 8th Edition - Clinical stage from 09/09/2019: FIGO Stage IIIC2, calculated as Stage IVB (cT2, cN2a, pM1) - Signed by Lafonda Mosses, MD on 09/15/2019   10/11/2019 Procedure   Successful placement of a power injectable Port-A-Cath via the left internal jugular vein. The catheter is ready for immediate use. Prominent right cervical lymphadenopathy and occlusion of the right internal jugular vein precluded right-sided placement.       10/21/2019 -  Chemotherapy   The patient had carboplatin and taxol for chemotherapy treatment.       REVIEW OF SYSTEMS:   Constitutional: Denies fevers, chills Eyes: Denies blurriness of vision Ears, nose, mouth, throat, and face: Denies mucositis or sore throat Respiratory: Denies cough, dyspnea or wheezes Cardiovascular: Denies palpitation, chest discomfort or lower extremity swelling Gastrointestinal:  Denies nausea, heartburn or change in bowel habits Skin: Denies abnormal skin rashes Lymphatics: Denies new lymphadenopathy or easy bruising Neurological:Denies numbness, tingling or new weaknesses Behavioral/Psych: Mood is stable, no new changes  All other systems were reviewed with the patient and are negative.  I have reviewed the past medical history, past surgical history, social history and family history with the patient and they are unchanged from previous note.  ALLERGIES:  is allergic to shellfish allergy and neomy-bacit-polymyx-pramoxine.  MEDICATIONS:  Current Outpatient Medications  Medication Sig  Dispense Refill  . acetaminophen (TYLENOL) 500 MG tablet Take 1,000 mg by mouth every 6 (six) hours as needed for moderate pain.     Marland Kitchen amLODipine (NORVASC) 10 MG tablet TAKE 1 TABLET BY MOUTH EVERY DAY 90 tablet 0  . aspirin 81 MG EC tablet Take 81 mg by mouth daily.     . bumetanide (BUMEX) 2 MG tablet Take 2 mg by mouth daily.  6  . busPIRone (BUSPAR) 10 MG tablet Take 1 by mouth twice daily .DX F41.9 APPT IS OVERDUE 60 tablet 0  . Carboxymethylcellulose Sodium (EYE DROPS OP) Place 1 drop into both eyes daily as needed (dry eyes).     Marland Kitchen Cod Liver Oil 1000 MG CAPS Take 1,000 mg by mouth 2 (two) times daily.     Marland Kitchen  dexamethasone (DECADRON) 4 MG tablet Take 2 tabs at the night before and 2 tab the morning of chemotherapy, every 3 weeks, by mouth x 6 cycles 8 tablet 6  . donepezil (ARICEPT) 5 MG tablet TAKE 1 TAB BY MOUTH AT BEDTIME 90 tablet 0  . hydrALAZINE (APRESOLINE) 100 MG tablet TAKE 1 TABLET (100 MG TOTAL) BY MOUTH 3 (THREE) TIMES DAILY. 270 tablet 0  . levothyroxine (SYNTHROID) 75 MCG tablet Take one tablet by mouth once daily 30 minutes before breakfast for thyroid 90 tablet 0  . lidocaine-prilocaine (EMLA) cream Apply to affected area once 30 g 3  . losartan (COZAAR) 100 MG tablet Take 1 by mouth daily at bedtime APPOINTMENT OVER DUE 30 tablet 0  . metoprolol succinate (TOPROL-XL) 100 MG 24 hr tablet Take 1 tablet (100 mg total) by mouth in the morning and at bedtime. Take with or immediately following a meal. 180 tablet 0  . Multiple Vitamin (MULTIVITAMIN ADULT PO) Take 1 tablet by mouth daily.    . ondansetron (ZOFRAN) 8 MG tablet Take 1 tablet (8 mg total) by mouth every 8 (eight) hours as needed. Start on the third day after chemotherapy but only take if needed 30 tablet 1  . prochlorperazine (COMPAZINE) 10 MG tablet Take 1 tablet (10 mg total) by mouth every 6 (six) hours as needed (Nausea or vomiting). 30 tablet 1  . senna-docusate (SENOKOT-S) 8.6-50 MG tablet Take 2 tablets by mouth  at bedtime. For AFTER surgery, do not take if having diarrhea 30 tablet 0  . traZODone (DESYREL) 50 MG tablet Take one tablet by mouth once daily at bedtime 90 tablet 0   No current facility-administered medications for this visit.   Facility-Administered Medications Ordered in Other Visits  Medication Dose Route Frequency Provider Last Rate Last Admin  . CARBOplatin (PARAPLATIN) 200 mg in sodium chloride 0.9 % 250 mL chemo infusion  200 mg Intravenous Once Alvy Bimler, Jarica Plass, MD      . heparin lock flush 100 unit/mL  500 Units Intracatheter Once PRN Alvy Bimler, Alfio Loescher, MD      . influenza vaccine adjuvanted (FLUAD) injection 0.5 mL  0.5 mL Intramuscular Once Alvy Bimler, Adriana Lina, MD      . PACLitaxel (TAXOL) 156 mg in sodium chloride 0.9 % 250 mL chemo infusion (> 70m/m2)  87.5 mg/m2 (Treatment Plan Recorded) Intravenous Once Cailen Texeira, MD      . sodium chloride flush (NS) 0.9 % injection 10 mL  10 mL Intracatheter PRN GAlvy Bimler Rayne Cowdrey, MD        PHYSICAL EXAMINATION: ECOG PERFORMANCE STATUS: 2 - Symptomatic, <50% confined to bed  Vitals:   02/24/20 1053  BP: (!) 155/44  Pulse: 63  Resp: 18  Temp: 98.2 F (36.8 C)  SpO2: 100%   Filed Weights   02/24/20 1053  Weight: 157 lb (71.2 kg)    GENERAL:alert, no distress and comfortable.  She looks thin and frail SKIN: skin color, texture, turgor are normal, no rashes or significant lesions EYES: normal, Conjunctiva are pink and non-injected, sclera clear OROPHARYNX:no exudate, no erythema and lips, buccal mucosa, and tongue normal  NECK: supple, thyroid normal size, non-tender, without nodularity LYMPH:  no palpable lymphadenopathy in the cervical, axillary or inguinal LUNGS: clear to auscultation and percussion with normal breathing effort HEART: regular rate & rhythm and no murmurs and no lower extremity edema ABDOMEN:abdomen soft, non-tender and normal bowel sounds Musculoskeletal:no cyanosis of digits and no clubbing  NEURO: alert & oriented x 3 with  fluent  speech, no focal motor/sensory deficits  LABORATORY DATA:  I have reviewed the data as listed    Component Value Date/Time   NA 139 02/24/2020 1025   K 3.8 02/24/2020 1025   CL 105 02/24/2020 1025   CO2 21 (L) 02/24/2020 1025   GLUCOSE 197 (H) 02/24/2020 1025   BUN 32 (H) 02/24/2020 1025   CREATININE 1.99 (H) 02/24/2020 1025   CREATININE 1.78 (H) 09/29/2019 1100   CALCIUM 9.1 02/24/2020 1025   PROT 7.0 02/24/2020 1025   ALBUMIN 3.5 02/24/2020 1025   AST 18 02/24/2020 1025   ALT 12 02/24/2020 1025   ALKPHOS 37 (L) 02/24/2020 1025   BILITOT 0.3 02/24/2020 1025   GFRNONAA 24 (L) 02/24/2020 1025   GFRNONAA 26 (L) 09/29/2019 1100   GFRAA 15 (L) 10/13/2019 1312   GFRAA 30 (L) 09/29/2019 1100    No results found for: SPEP, UPEP  Lab Results  Component Value Date   WBC 2.6 (L) 02/24/2020   NEUTROABS 1.7 02/24/2020   HGB 8.8 (L) 02/24/2020   HCT 25.6 (L) 02/24/2020   MCV 97.3 02/24/2020   PLT 108 (L) 02/24/2020      Chemistry      Component Value Date/Time   NA 139 02/24/2020 1025   K 3.8 02/24/2020 1025   CL 105 02/24/2020 1025   CO2 21 (L) 02/24/2020 1025   BUN 32 (H) 02/24/2020 1025   CREATININE 1.99 (H) 02/24/2020 1025   CREATININE 1.78 (H) 09/29/2019 1100      Component Value Date/Time   CALCIUM 9.1 02/24/2020 1025   ALKPHOS 37 (L) 02/24/2020 1025   AST 18 02/24/2020 1025   ALT 12 02/24/2020 1025   BILITOT 0.3 02/24/2020 1025

## 2020-02-24 NOTE — Assessment & Plan Note (Signed)
She had recent progressive weight loss I will adjust the dose of her treatment

## 2020-02-24 NOTE — Patient Instructions (Signed)
[facility] Discharge Instructions for Patients Receiving Chemotherapy  Today you received the following chemotherapy agents: paclitaxel/carboplatin.  To help prevent nausea and vomiting after your treatment, we encourage you to take your nausea medication as directed.  If you develop nausea and vomiting that is not controlled by your nausea medication, call the clinic.   BELOW ARE SYMPTOMS THAT SHOULD BE REPORTED IMMEDIATELY:  *FEVER GREATER THAN 100.5 F  *CHILLS WITH OR WITHOUT FEVER  NAUSEA AND VOMITING THAT IS NOT CONTROLLED WITH YOUR NAUSEA MEDICATION  *UNUSUAL SHORTNESS OF BREATH  *UNUSUAL BRUISING OR BLEEDING  TENDERNESS IN MOUTH AND THROAT WITH OR WITHOUT PRESENCE OF ULCERS  *URINARY PROBLEMS  *BOWEL PROBLEMS  UNUSUAL RASH Items with * indicate a potential emergency and should be followed up as soon as possible.  Feel free to call the clinic should you have any questions or concerns. The clinic phone number is [phone].  Please show the CHEMO ALERT CARD at check-in to the Emergency Department and triage nurse.   

## 2020-02-24 NOTE — Assessment & Plan Note (Signed)
She has significant fluctuation of her renal function We will proceed with treatment as scheduled  She is instructed to increase oral fluid hydration as tolerated and aggressive risk factor management

## 2020-02-24 NOTE — Patient Instructions (Signed)

## 2020-02-24 NOTE — Assessment & Plan Note (Signed)
She continues to tolerate treatment very poorly She has progressive pancytopenia, uncontrolled hypertension, persistent renal failure, and others Despite being with family for additional support, she did not thrive in the environment  She is currently living with another daughter in [place] and is traveling back and forth for treatment She has not establish care with a new primary doctor Overall, we will proceed with treatment today with dose adjustment given her pancytopenia and renal failure We have extensive discussions about the importance of getting established with a primary care doctor for other health issues We discussed future follow-up after completion of chemotherapy I would like to order CT imaging in 1 month for baseline assessment We discussed briefly the risk and benefits of adjuvant radiation treatment Initially, her daughter felt it is best for her to get her imaging study done in [place] but at the end of the visit, changed her mind and would like to CT imaging done here I will schedule return appointment next month for further follow-up

## 2020-02-24 NOTE — Assessment & Plan Note (Signed)
She continues to have severe, uncontrolled hypertension In our previous visits, I recommend close monitoring of blood pressure  For now, I do not plan to adjust her medications I recommend establish with a new primary doctor for medical management

## 2020-02-24 NOTE — Assessment & Plan Note (Signed)
She has stable pancytopenia We will proceed with treatment with caution I plan to reduce the dose of carboplatin further along with Taxol due to recent weight loss

## 2020-02-24 NOTE — Telephone Encounter (Signed)
Scheduled appts per 2/10 sch msg. Gave pt a print out of AVS.  

## 2020-02-28 ENCOUNTER — Other Ambulatory Visit: Payer: Self-pay | Admitting: *Deleted

## 2020-02-28 DIAGNOSIS — G3184 Mild cognitive impairment, so stated: Secondary | ICD-10-CM

## 2020-02-28 DIAGNOSIS — F419 Anxiety disorder, unspecified: Secondary | ICD-10-CM

## 2020-02-28 MED ORDER — DONEPEZIL HCL 5 MG PO TABS: ORAL_TABLET | ORAL | 0 refills | Status: DC

## 2020-02-28 MED ORDER — AMLODIPINE BESYLATE 10 MG PO TABS: 10.0000 mg | ORAL_TABLET | Freq: Every day | ORAL | 0 refills | Status: DC

## 2020-02-28 MED ORDER — HYDRALAZINE HCL 100 MG PO TABS: 100.0000 mg | ORAL_TABLET | Freq: Three times a day (TID) | ORAL | 0 refills | Status: DC

## 2020-02-28 MED ORDER — METOPROLOL SUCCINATE ER 100 MG PO TB24: 100.0000 mg | ORAL_TABLET | Freq: Two times a day (BID) | ORAL | 0 refills | Status: DC

## 2020-02-28 MED ORDER — LEVOTHYROXINE SODIUM 75 MCG PO TABS: ORAL_TABLET | ORAL | 0 refills | Status: DC

## 2020-02-28 MED ORDER — BUSPIRONE HCL 10 MG PO TABS: ORAL_TABLET | ORAL | 0 refills | Status: DC

## 2020-02-28 MED ORDER — TRAZODONE HCL 50 MG PO TABS: ORAL_TABLET | ORAL | 0 refills | Status: DC

## 2020-02-28 MED ORDER — LOSARTAN POTASSIUM 100 MG PO TABS: ORAL_TABLET | ORAL | 0 refills | Status: DC

## 2020-02-28 NOTE — Telephone Encounter (Signed)
Cori Razor, daughter, called and stated that patient has moved to [place] with daughter. Stated that she will not be seeing new PCP until 03/23/2020 and needs refills on all her medications you prescribe.   Pended Rx's and sent to [facility] for approval.

## 2020-03-06 DIAGNOSIS — E669 Obesity, unspecified: Secondary | ICD-10-CM | POA: Diagnosis not present

## 2020-03-06 DIAGNOSIS — E119 Type 2 diabetes mellitus without complications: Secondary | ICD-10-CM | POA: Diagnosis not present

## 2020-03-06 DIAGNOSIS — Z6828 Body mass index (BMI) 28.0-28.9, adult: Secondary | ICD-10-CM | POA: Diagnosis not present

## 2020-03-06 DIAGNOSIS — I129 Hypertensive chronic kidney disease with stage 1 through stage 4 chronic kidney disease, or unspecified chronic kidney disease: Secondary | ICD-10-CM | POA: Diagnosis not present

## 2020-03-06 DIAGNOSIS — C541 Malignant neoplasm of endometrium: Secondary | ICD-10-CM | POA: Diagnosis not present

## 2020-03-06 DIAGNOSIS — N179 Acute kidney failure, unspecified: Secondary | ICD-10-CM | POA: Diagnosis not present

## 2020-03-06 DIAGNOSIS — Z91013 Allergy to seafood: Secondary | ICD-10-CM | POA: Diagnosis not present

## 2020-03-06 DIAGNOSIS — N189 Chronic kidney disease, unspecified: Secondary | ICD-10-CM | POA: Diagnosis not present

## 2020-03-14 ENCOUNTER — Telehealth: Payer: Self-pay

## 2020-03-14 NOTE — Telephone Encounter (Signed)
-----   Message from Heath Lark, MD sent at 03/14/2020  8:12 AM EST ----- Regarding: CT and appt When I saw her last, I gave patient and family instructions to call and schedule CT They never call to schedule; I tried to accomodate for their needs because they are flying in from [place] Can you ask if they still want to keep appt or cancel?

## 2020-03-14 NOTE — Telephone Encounter (Signed)
Spoke with patient, patient states her daugther/care giver is actually out of town.  Pt verbalized she will relay message to her family to have them contact us to follow up with decision of CT and appointments.

## 2020-03-17 ENCOUNTER — Telehealth: Payer: Self-pay

## 2020-03-17 NOTE — Telephone Encounter (Signed)
Called and left a message for daughter to call and schedule CT scan. Left # for radiology scheduling. Appt canceled with Dr. Alvy Bimler on 3/8.  Ask them to call the office back when the scan is scheduled and the office will reschedule appt with Dr. Alvy Bimler.  FYI

## 2020-03-20 ENCOUNTER — Other Ambulatory Visit: Payer: Medicare Other

## 2020-03-20 ENCOUNTER — Inpatient Hospital Stay: Admission: RE | Admit: 2020-03-20 | Payer: Medicare Other | Source: Ambulatory Visit

## 2020-03-21 ENCOUNTER — Ambulatory Visit: Payer: Medicare Other | Admitting: Hematology and Oncology

## 2020-03-22 ENCOUNTER — Other Ambulatory Visit: Payer: Self-pay | Admitting: Nurse Practitioner

## 2020-03-22 DIAGNOSIS — F419 Anxiety disorder, unspecified: Secondary | ICD-10-CM

## 2020-03-25 ENCOUNTER — Other Ambulatory Visit: Payer: Self-pay | Admitting: Nurse Practitioner

## 2020-04-03 ENCOUNTER — Telehealth: Payer: Self-pay

## 2020-04-03 MED ORDER — METOPROLOL SUCCINATE ER 100 MG PO TB24: ORAL_TABLET | ORAL | 0 refills | Status: DC

## 2020-04-03 MED ORDER — BUMETANIDE 2 MG PO TABS: 2.0000 mg | ORAL_TABLET | Freq: Every day | ORAL | 0 refills | Status: DC

## 2020-04-03 NOTE — Telephone Encounter (Signed)
Patient's daughter called stating the patient will be in town soon for a few months from [place] but is currently out of her medications (bumex 2mg  and toprol 0mg  BID) and would like them to [facility] sent [facility] [place] for now. She also wants to schedule an appointment to be seen. Sent to pharmacy

## 2020-04-03 NOTE — Telephone Encounter (Signed)
Pt's daughter notified of upcoming appointments for CT scans and follow up with MD.  Appointments scheduled based on patient/family being in [place].

## 2020-04-10 ENCOUNTER — Other Ambulatory Visit: Payer: Self-pay

## 2020-04-10 NOTE — Telephone Encounter (Signed)
Is she still our patient? She was scheduled to move.

## 2020-04-10 NOTE — Telephone Encounter (Signed)
Daughter called needing prescription [facility] [place] came up when trying to send to pharmacy. Pended and sent to Sherrie Mustache, NP

## 2020-04-11 MED ORDER — BUMETANIDE 2 MG PO TABS: 2.0000 mg | ORAL_TABLET | Freq: Every day | ORAL | 0 refills | Status: DC

## 2020-04-14 ENCOUNTER — Telehealth: Payer: Self-pay | Admitting: Hematology and Oncology

## 2020-04-14 NOTE — Telephone Encounter (Signed)
R/s appts per 3/31 sch msg. Pt's daughter is aware.

## 2020-04-23 ENCOUNTER — Other Ambulatory Visit: Payer: Self-pay | Admitting: Nurse Practitioner

## 2020-04-23 DIAGNOSIS — F419 Anxiety disorder, unspecified: Secondary | ICD-10-CM

## 2020-04-24 ENCOUNTER — Ambulatory Visit: Payer: Self-pay | Admitting: Nurse Practitioner

## 2020-04-24 ENCOUNTER — Other Ambulatory Visit: Payer: Self-pay | Admitting: *Deleted

## 2020-04-24 MED ORDER — LEVOTHYROXINE SODIUM 75 MCG PO TABS: ORAL_TABLET | ORAL | 0 refills | Status: DC

## 2020-04-24 NOTE — Telephone Encounter (Signed)
[facility] requested.

## 2020-04-25 ENCOUNTER — Ambulatory Visit (HOSPITAL_COMMUNITY): Payer: Medicare Other

## 2020-04-25 ENCOUNTER — Other Ambulatory Visit: Payer: Medicare Other

## 2020-04-27 ENCOUNTER — Ambulatory Visit: Payer: Medicare Other | Admitting: Hematology and Oncology

## 2020-04-28 ENCOUNTER — Other Ambulatory Visit: Payer: Self-pay | Admitting: Nurse Practitioner

## 2020-05-01 ENCOUNTER — Other Ambulatory Visit: Payer: Self-pay

## 2020-05-01 ENCOUNTER — Ambulatory Visit (INDEPENDENT_AMBULATORY_CARE_PROVIDER_SITE_OTHER): Payer: Medicare Other | Admitting: Nurse Practitioner

## 2020-05-01 ENCOUNTER — Encounter: Payer: Self-pay | Admitting: Nurse Practitioner

## 2020-05-01 VITALS — BP 138/50 | HR 63 | Temp 97.5°F | Ht 65.0 in | Wt 153.0 lb

## 2020-05-01 DIAGNOSIS — I10 Essential (primary) hypertension: Secondary | ICD-10-CM | POA: Diagnosis not present

## 2020-05-01 DIAGNOSIS — G3184 Mild cognitive impairment, so stated: Secondary | ICD-10-CM | POA: Diagnosis not present

## 2020-05-01 DIAGNOSIS — F0280 Dementia in other diseases classified elsewhere without behavioral disturbance: Secondary | ICD-10-CM

## 2020-05-01 DIAGNOSIS — C541 Malignant neoplasm of endometrium: Secondary | ICD-10-CM | POA: Diagnosis not present

## 2020-05-01 DIAGNOSIS — E039 Hypothyroidism, unspecified: Secondary | ICD-10-CM | POA: Diagnosis not present

## 2020-05-01 DIAGNOSIS — F419 Anxiety disorder, unspecified: Secondary | ICD-10-CM | POA: Diagnosis not present

## 2020-05-01 DIAGNOSIS — N184 Chronic kidney disease, stage 4 (severe): Secondary | ICD-10-CM

## 2020-05-01 DIAGNOSIS — G4700 Insomnia, unspecified: Secondary | ICD-10-CM | POA: Diagnosis not present

## 2020-05-01 MED ORDER — LOSARTAN POTASSIUM 100 MG PO TABS: 100.0000 mg | ORAL_TABLET | Freq: Every day | ORAL | 1 refills | Status: DC

## 2020-05-01 MED ORDER — METOPROLOL SUCCINATE ER 100 MG PO TB24: ORAL_TABLET | ORAL | 1 refills | Status: DC

## 2020-05-01 MED ORDER — LEVOTHYROXINE SODIUM 75 MCG PO TABS: ORAL_TABLET | ORAL | 1 refills | Status: DC

## 2020-05-01 MED ORDER — DONEPEZIL HCL 5 MG PO TABS: ORAL_TABLET | ORAL | 1 refills | Status: DC

## 2020-05-01 MED ORDER — TRAZODONE HCL 50 MG PO TABS: ORAL_TABLET | ORAL | 1 refills | Status: DC

## 2020-05-01 MED ORDER — BUSPIRONE HCL 10 MG PO TABS: 10.0000 mg | ORAL_TABLET | Freq: Two times a day (BID) | ORAL | 1 refills | Status: DC

## 2020-05-01 NOTE — Progress Notes (Signed)
Careteam: Patient Care Team: [name], NP as PCP - General (Geriatric Medicine) Jettie Booze, MD as PCP - Cardiology (Cardiology) Esperanza Heir, MD as Consulting Physician (Obstetrics and Gynecology) Consuella Lose, MD as Consulting Physician (Neurosurgery) Deterding, Jeneen Rinks, MD as Consulting Physician (Nephrology)  PLACE OF SERVICE:  [place] Directive information Does Patient Have a Medical Advance Directive?: No, Would patient like information on creating a medical advance directive?: Yes (MAU/Ambulatory/Procedural Areas - Information given)  Allergies  Allergen Reactions  . Shellfish Allergy Swelling and Rash    MOUTH  . Neomy-Bacit-Polymyx-Pramoxine     UNSPECIFIED REACTION  Not sure which antibiotic she has a reaction to    Chief Complaint  Patient presents with  . Medical Management of Chronic Issues    5 month follow-up and discuss sleep issues (trouble staying asleep) and fasting for labs. Discuss need for td/tdap, covid boosters, and mammogram. Here with Netti      HPI: Patient is a 86 y.o. female for follow up. She is living between [place] and [place] to live with her children.   Pt with endometrial cancer followed by oncologist and has not been tolerating treatment. She has pancytopenia, htn, CKD. Weight loss, She was living in [place] and traveling back and forth for treatment. She has follow up CT abdomen tomorrow and then follow up with oncology later this week.   Reports she gets dizzy at times.   Ongoing sleep issues- she is afraid to go to sleep due to the dreams.  Sleeps well during the day.   Weight loss- snacks thought the day- small meals.   Anxiety/depression- overall feels fine and that symptoms are controlled.   Review of Systems:  Review of Systems  Constitutional: Negative for chills, fever and weight loss.  HENT: Negative for tinnitus.   Respiratory: Negative for cough, sputum  production and shortness of breath.   Cardiovascular: Negative for chest pain, palpitations and leg swelling.  Gastrointestinal: Negative for abdominal pain, constipation, diarrhea and heartburn.  Genitourinary: Negative for dysuria, frequency and urgency.  Musculoskeletal: Negative for back pain, falls, joint pain and myalgias.  Skin: Negative.   Neurological: Positive for dizziness. Negative for headaches.  Psychiatric/Behavioral: Positive for memory loss. Negative for depression. The patient is nervous/anxious. The patient does not have insomnia.     Past Medical History:  Diagnosis Date  . Allergy    mild   . Anemia   . Arthritis    pt states no pain   . Breast cancer ([place])    left   . Chronic kidney disease    ckd stage 4 per lov dr deterding 08-03-2018 on chart  . Dementia ([place])   . Depression   . Gastroesophageal reflux disease   . Heart murmur   . Heartburn   . Hypertension   . Hypothyroidism   . Leg cramps   . Post-menopause bleeding   . Restless leg syndrome 10/07/2014  . Sleep apnea    cpap not in use    Past Surgical History:  Procedure Laterality Date  . back sugery   2019   lower back   . BREAST LUMPECTOMY    . BREAST SURGERY Left 2013   Lumpectomy  radiation done no chemo  . CATARACT EXTRACTION, BILATERAL    . DILATATION & CURETTAGE/HYSTEROSCOPY WITH MYOSURE N/A 08/23/2019   Procedure: [place];  Surgeon: [name], MD;  Location: [facility];  Service: Gynecology;  Laterality: N/A;  request to follow 2nd case in [place] block requests one hour  . hysteroscopy biopsy    . IR IMAGING GUIDED PORT INSERTION  10/11/2019  . ROBOTIC ASSISTED TOTAL HYSTERECTOMY WITH BILATERAL SALPINGO OOPHERECTOMY Bilateral 09/09/2019   Procedure: XI ROBOTIC ASSISTED TOTAL HYSTERECTOMY WITH BILATERAL SALPINGO OOPHORECTOMY;  Surgeon: Lafonda Mosses, MD;  Location: [facility];  Service: Gynecology;  Laterality:  Bilateral;  . SENTINEL NODE BIOPSY N/A 09/09/2019   Procedure: SENTINEL NODE BIOPSY AND POSSIBLE LYMPH NODE DISECTION AND POSSIBLE LAPAROTOMY;  Surgeon: Lafonda Mosses, MD;  Location: [facility];  Service: Gynecology;  Laterality: N/A;   Social History:   reports that she quit smoking about 14 years ago. She has a 2.00 pack-year smoking history. She has never used smokeless tobacco. She reports that she does not drink alcohol and does not use drugs.  Family History  Problem Relation Age of Onset  . Hypertension Mother   . Arthritis Mother   . Heart disease Mother   . Hypertension Father   . Heart disease Father   . Breast cancer Sister   . Lung cancer Brother   . Breast cancer Paternal Aunt   . Colon cancer Neg Hx   . Colon polyps Neg Hx   . Esophageal cancer Neg Hx   . Rectal cancer Neg Hx   . Stomach cancer Neg Hx     Medications: Patient's Medications  New Prescriptions   No medications on file  Previous Medications   ACETAMINOPHEN (TYLENOL) 500 MG TABLET    Take 1,000 mg by mouth every 6 (six) hours as needed for moderate pain.    AMLODIPINE (NORVASC) 10 MG TABLET    Take 1 tablet (10 mg total) by mouth daily.   ASPIRIN 81 MG EC TABLET    Take 81 mg by mouth daily.    BUMETANIDE (BUMEX) 2 MG TABLET    Take 1 tablet (2 mg total) by mouth daily.   BUSPIRONE (BUSPAR) 10 MG TABLET    TAKE 1 TABLET BY MOUTH TWICE DAILY .DX F41.9   CARBOXYMETHYLCELLULOSE SODIUM (EYE DROPS OP)    Place 1 drop into both eyes daily as needed (dry eyes).    COD LIVER OIL 1000 MG CAPS    Take 1,000 mg by mouth 2 (two) times daily.    DONEPEZIL (ARICEPT) 5 MG TABLET    Take one tablet by mouth at bedtime   HYDRALAZINE (APRESOLINE) 100 MG TABLET    Take 1 tablet (100 mg total) by mouth 3 (three) times daily.   LEVOTHYROXINE (SYNTHROID) 75 MCG TABLET    Take one tablet by mouth once daily 30 minutes before breakfast for thyroid   LOSARTAN (COZAAR) 100 MG TABLET    TAKE 1 TABLET BY MOUTH EVERYDAY AT  BEDTIME   METOPROLOL SUCCINATE (TOPROL-XL) 100 MG 24 HR TABLET    TAKE 1 TABLET BY MOUTH IN THE MORNING AND AT BEDTIME. TAKE WITH OR IMMEDIATELY FOLLOWING A MEAL.   MULTIPLE VITAMIN (MULTIVITAMIN ADULT PO)    Take 1 tablet by mouth daily.   SENNOSIDES-DOCUSATE SODIUM (SENOKOT-S) 8.6-50 MG TABLET    Take 1 tablet by mouth as needed for constipation.   TRAZODONE (DESYREL) 50 MG TABLET    Take one tablet by mouth once daily at bedtime  Modified Medications   No medications on file  Discontinued Medications   DEXAMETHASONE (DECADRON) 4 MG TABLET    Take 2 tabs at the night before and 2 tab the morning  of chemotherapy, every 3 weeks, by mouth x 6 cycles   LIDOCAINE-PRILOCAINE (EMLA) CREAM    Apply to affected area once   ONDANSETRON (ZOFRAN) 8 MG TABLET    Take 1 tablet (8 mg total) by mouth every 8 (eight) hours as needed. Start on the third day after chemotherapy but only take if needed   PROCHLORPERAZINE (COMPAZINE) 10 MG TABLET    Take 1 tablet (10 mg total) by mouth every 6 (six) hours as needed (Nausea or vomiting).   SENNA-DOCUSATE (SENOKOT-S) 8.6-50 MG TABLET    Take 2 tablets by mouth at bedtime. For AFTER surgery, do not take if having diarrhea    Physical Exam:  Vitals:   05/01/20 1111  BP: (!) 138/50  Pulse: 63  Temp: (!) 97.5 F (36.4 C)  TempSrc: Temporal  SpO2: 99%  Weight: 153 lb (69.4 kg)  Height: 5\' 5"  (1.651 m)   Body mass index is 25.46 kg/m. Wt Readings from Last 3 Encounters:  05/01/20 153 lb (69.4 kg)  02/24/20 157 lb (71.2 kg)  01/27/20 161 lb 9.6 oz (73.3 kg)    Physical Exam Constitutional:      General: She is not in acute distress.    Appearance: She is well-developed. She is not diaphoretic.  HENT:     Head: Normocephalic and atraumatic.     Mouth/Throat:     Pharynx: No oropharyngeal exudate.  Eyes:     Conjunctiva/sclera: Conjunctivae normal.     Pupils: Pupils are equal, round, and reactive to light.  Cardiovascular:     Rate and Rhythm:  Normal rate and regular rhythm.     Heart sounds: Normal heart sounds.  Pulmonary:     Effort: Pulmonary effort is normal.     Breath sounds: Normal breath sounds.  Abdominal:     General: Bowel sounds are normal.     Palpations: Abdomen is soft.  Musculoskeletal:        General: No tenderness.     Cervical back: Normal range of motion and neck supple.  Skin:    General: Skin is warm and dry.  Neurological:     Mental Status: She is alert. Mental status is at baseline.     Labs reviewed: Basic Metabolic Panel: Recent Labs    08/31/19 1442 09/06/19 1346 12/29/19 1318 01/27/20 0947 02/24/20 1025  NA 139   < > 141 137 139  K 4.7   < > 3.7 3.9 3.8  CL 107   < > 107 104 105  CO2 23   < > 28 23 21*  GLUCOSE 95   < > 98 213* 197*  BUN 24   < > 21 31* 32*  CREATININE 1.90*   < > 1.82* 1.88* 1.99*  CALCIUM 9.0   < > 9.1 8.8* 9.1  TSH 0.47  --   --   --   --    < > = values in this interval not displayed.   Liver Function Tests: Recent Labs    12/29/19 1318 01/27/20 0947 02/24/20 1025  AST 21 19 18   ALT 14 11 12   ALKPHOS 37* 39 37*  BILITOT 0.4 0.3 0.3  PROT 7.6 7.1 7.0  ALBUMIN 3.7 3.4* 3.5   Recent Labs    09/29/19 1100  LIPASE 49  AMYLASE 99   No results for input(s): AMMONIA in the last 8760 hours. CBC: Recent Labs    12/29/19 1318 01/27/20 0947 02/24/20 1025  WBC 2.7* 1.9* 2.6*  NEUTROABS  1.1* 1.2* 1.7  HGB 9.2* 8.7* 8.8*  HCT 27.3* 26.0* 25.6*  MCV 92.5 97.4 97.3  PLT 155 128* 108*   Lipid Panel: Recent Labs    08/31/19 1442  CHOL 157  HDL 35*  LDLCALC 102*  TRIG 100  CHOLHDL 4.5   TSH: Recent Labs    08/31/19 1442  TSH 0.47   A1C: No results found for: HGBA1C   Assessment/Plan 1. Endometrial cancer ([facility]) -continues to follow up with oncologist, did not tolerate treatment well due to side effects. has CT scheduled for evaluation then follow up with oncologist scheduled this week.   2. Late onset Alzheimer's disease without  behavioral disturbance ([place]) -ongoing, continues on aricept. Will have her take during the day due to the vivid dreams - donepezil (ARICEPT) 5 MG tablet; Take one tablet by mouth daily  Dispense: 90 tablet; Refill: 1  3. Insomnia, unspecified type -will change aricept to daytime to see if this helps vivid dreams.  -encouraged routine at bedtime and sleep hygiene  - traZODone (DESYREL) 50 MG tablet; Take one tablet by mouth once daily at bedtime  Dispense: 90 tablet; Refill: 1  4. Essential hypertension -controlled at today's visit. Does not check bp at home.  -continue current medication regimen and low sodium diet.  - metoprolol succinate (TOPROL-XL) 100 MG 24 hr tablet; Take with or immediately following a meal.  Dispense: 180 tablet; Refill: 1 - losartan (COZAAR) 100 MG tablet; Take 1 tablet (100 mg total) by mouth daily.  Dispense: 90 tablet; Refill: 1  5. Chronic kidney disease, stage 4 (severe) ([facility]) -Encourage proper hydration and to avoid NSAIDS (Aleve, Advil, Motrin, Ibuprofen), dose adjust medication as needed  6. Hypothyroidism, unspecified type tsh at goal  - levothyroxine (SYNTHROID) 75 MCG tablet; Take one tablet by mouth once daily 30 minutes before breakfast for thyroid  Dispense: 90 tablet; Refill: 1  7. Anxiety -stable, continues on buspar BID - busPIRone (BUSPAR) 10 MG tablet; Take 1 tablet (10 mg total) by mouth 2 (two) times daily.  Dispense: 180 tablet; Refill: 1  Next appt: 4 months Deshanae Lindo K. [place], [facility] [phone]

## 2020-05-01 NOTE — Patient Instructions (Addendum)
Start taking aricept in the morning.

## 2020-05-02 ENCOUNTER — Ambulatory Visit (HOSPITAL_COMMUNITY)
Admission: RE | Admit: 2020-05-02 | Discharge: 2020-05-02 | Disposition: A | Discharge status: Home / Self Care | Payer: Medicare Other | Source: Ambulatory Visit | Attending: Hematology and Oncology | Admitting: Hematology and Oncology

## 2020-05-02 ENCOUNTER — Inpatient Hospital Stay: Payer: Medicare Other | Attending: Hematology and Oncology

## 2020-05-02 DIAGNOSIS — R630 Anorexia: Secondary | ICD-10-CM | POA: Insufficient documentation

## 2020-05-02 DIAGNOSIS — I700 Atherosclerosis of aorta: Secondary | ICD-10-CM | POA: Diagnosis not present

## 2020-05-02 DIAGNOSIS — R591 Generalized enlarged lymph nodes: Secondary | ICD-10-CM | POA: Insufficient documentation

## 2020-05-02 DIAGNOSIS — D61818 Other pancytopenia: Secondary | ICD-10-CM | POA: Insufficient documentation

## 2020-05-02 DIAGNOSIS — I2510 Atherosclerotic heart disease of native coronary artery without angina pectoris: Secondary | ICD-10-CM | POA: Diagnosis not present

## 2020-05-02 DIAGNOSIS — M4326 Fusion of spine, lumbar region: Secondary | ICD-10-CM | POA: Diagnosis not present

## 2020-05-02 DIAGNOSIS — Z79899 Other long term (current) drug therapy: Secondary | ICD-10-CM | POA: Insufficient documentation

## 2020-05-02 DIAGNOSIS — Z609 Problem related to social environment, unspecified: Secondary | ICD-10-CM | POA: Insufficient documentation

## 2020-05-02 DIAGNOSIS — N3289 Other specified disorders of bladder: Secondary | ICD-10-CM | POA: Diagnosis not present

## 2020-05-02 DIAGNOSIS — C541 Malignant neoplasm of endometrium: Secondary | ICD-10-CM | POA: Diagnosis not present

## 2020-05-02 DIAGNOSIS — R634 Abnormal weight loss: Secondary | ICD-10-CM | POA: Insufficient documentation

## 2020-05-02 DIAGNOSIS — J984 Other disorders of lung: Secondary | ICD-10-CM | POA: Diagnosis not present

## 2020-05-02 DIAGNOSIS — N184 Chronic kidney disease, stage 4 (severe): Secondary | ICD-10-CM | POA: Insufficient documentation

## 2020-05-02 DIAGNOSIS — Z853 Personal history of malignant neoplasm of breast: Secondary | ICD-10-CM | POA: Insufficient documentation

## 2020-05-02 DIAGNOSIS — I129 Hypertensive chronic kidney disease with stage 1 through stage 4 chronic kidney disease, or unspecified chronic kidney disease: Secondary | ICD-10-CM | POA: Insufficient documentation

## 2020-05-02 LAB — CBC WITH DIFFERENTIAL (CANCER CENTER ONLY)
Abs Immature Granulocytes: 0.01 10*3/uL (ref 0.00–0.07)
Basophils Absolute: 0 10*3/uL (ref 0.0–0.1)
Basophils Relative: 0 %
Eosinophils Absolute: 0 10*3/uL (ref 0.0–0.5)
Eosinophils Relative: 1 %
HCT: 30.3 % — ABNORMAL LOW (ref 36.0–46.0)
Hemoglobin: 10 g/dL — ABNORMAL LOW (ref 12.0–15.0)
Immature Granulocytes: 0 %
Lymphocytes Relative: 30 %
Lymphs Abs: 1.1 10*3/uL (ref 0.7–4.0)
MCH: 33.7 pg (ref 26.0–34.0)
MCHC: 33 g/dL (ref 30.0–36.0)
MCV: 102 fL — ABNORMAL HIGH (ref 80.0–100.0)
Monocytes Absolute: 0.4 10*3/uL (ref 0.1–1.0)
Monocytes Relative: 10 %
Neutro Abs: 2.2 10*3/uL (ref 1.7–7.7)
Neutrophils Relative %: 59 %
Platelet Count: 179 10*3/uL (ref 150–400)
RBC: 2.97 MIL/uL — ABNORMAL LOW (ref 3.87–5.11)
RDW: 14.6 % (ref 11.5–15.5)
WBC Count: 3.7 10*3/uL — ABNORMAL LOW (ref 4.0–10.5)
nRBC: 0 % (ref 0.0–0.2)

## 2020-05-02 LAB — CMP (CANCER CENTER ONLY)
ALT: 14 U/L (ref 0–44)
AST: 22 U/L (ref 15–41)
Albumin: 4 g/dL (ref 3.5–5.0)
Alkaline Phosphatase: 38 U/L (ref 38–126)
Anion gap: 12 (ref 5–15)
BUN: 36 mg/dL — ABNORMAL HIGH (ref 8–23)
CO2: 27 mmol/L (ref 22–32)
Calcium: 9.7 mg/dL (ref 8.9–10.3)
Chloride: 102 mmol/L (ref 98–111)
Creatinine: 2.43 mg/dL — ABNORMAL HIGH (ref 0.44–1.00)
GFR, Estimated: 19 mL/min — ABNORMAL LOW (ref >60)
Glucose, Bld: 82 mg/dL (ref 70–99)
Potassium: 4.1 mmol/L (ref 3.5–5.1)
Sodium: 141 mmol/L (ref 135–145)
Total Bilirubin: 0.4 mg/dL (ref 0.3–1.2)
Total Protein: 8 g/dL (ref 6.5–8.1)

## 2020-05-03 ENCOUNTER — Other Ambulatory Visit: Payer: Self-pay

## 2020-05-03 ENCOUNTER — Encounter: Payer: Self-pay | Admitting: Hematology and Oncology

## 2020-05-03 ENCOUNTER — Inpatient Hospital Stay (HOSPITAL_BASED_OUTPATIENT_CLINIC_OR_DEPARTMENT_OTHER): Payer: Medicare Other | Admitting: Hematology and Oncology

## 2020-05-03 ENCOUNTER — Telehealth: Payer: Self-pay

## 2020-05-03 DIAGNOSIS — Z609 Problem related to social environment, unspecified: Secondary | ICD-10-CM | POA: Diagnosis not present

## 2020-05-03 DIAGNOSIS — C541 Malignant neoplasm of endometrium: Secondary | ICD-10-CM

## 2020-05-03 DIAGNOSIS — I10 Essential (primary) hypertension: Secondary | ICD-10-CM | POA: Diagnosis not present

## 2020-05-03 DIAGNOSIS — Z853 Personal history of malignant neoplasm of breast: Secondary | ICD-10-CM | POA: Diagnosis not present

## 2020-05-03 DIAGNOSIS — D61818 Other pancytopenia: Secondary | ICD-10-CM | POA: Diagnosis not present

## 2020-05-03 DIAGNOSIS — I129 Hypertensive chronic kidney disease with stage 1 through stage 4 chronic kidney disease, or unspecified chronic kidney disease: Secondary | ICD-10-CM | POA: Diagnosis not present

## 2020-05-03 DIAGNOSIS — R634 Abnormal weight loss: Secondary | ICD-10-CM | POA: Diagnosis not present

## 2020-05-03 DIAGNOSIS — Z79899 Other long term (current) drug therapy: Secondary | ICD-10-CM | POA: Diagnosis not present

## 2020-05-03 DIAGNOSIS — R591 Generalized enlarged lymph nodes: Secondary | ICD-10-CM | POA: Diagnosis not present

## 2020-05-03 DIAGNOSIS — N184 Chronic kidney disease, stage 4 (severe): Secondary | ICD-10-CM | POA: Diagnosis not present

## 2020-05-03 DIAGNOSIS — R630 Anorexia: Secondary | ICD-10-CM | POA: Diagnosis not present

## 2020-05-03 NOTE — Assessment & Plan Note (Signed)
Her pancytopenia has improved since discontinuation of treatment Observe closely

## 2020-05-03 NOTE — Telephone Encounter (Signed)
Called patients daughter, no answer, and mailbox is full. I will have to try to reach patient again later

## 2020-05-03 NOTE — Assessment & Plan Note (Signed)
She continues to have gradual weight loss Her daughter felt that she is eating better but overall, she continues to have progressive weight loss In the past, I have asked family members to document her oral intake but this has not been done

## 2020-05-03 NOTE — Progress Notes (Signed)
[place] OFFICE PROGRESS NOTE  Patient Care Team: [name], NP as PCP - General (Geriatric Medicine) Jettie Booze, MD as PCP - Cardiology (Cardiology) Esperanza Heir, MD as Consulting Physician (Obstetrics and Gynecology) Consuella Lose, MD as Consulting Physician (Neurosurgery) Deterding, Jeneen Rinks, MD as Consulting Physician (Nephrology)  ASSESSMENT & PLAN:  Endometrial cancer [facility]) She has high risk disease Her treatment was delayed multiple times due to her social situations The CT imaging showed regression of lymphadenopathy in her chest reflecting positive response to treatment but that would also means she probably have stage IV disease several months ago She has new lymphadenopathy in the para-aortic region and possible disease in the vagina cuff that would warrant further evaluation and treatment The most important issue is whether the patient is able to complete treatment in one location versus her moving out of state If she is able to get reliable caregivers to take care of her, my preference would be to refer her to radiation oncology for consultation for radiation therapy as part of her adjuvant treatment At current state, I do not believe she has reliable caregiver All her other comorbidities are also not adequately treated She has lost a lot of weight since she started treatment When I saw her last year, she was 176 pounds She has progressive weight loss due to lack of appetite and not eating and drinking enough In every visit, a different caregiver is present and whenever I question the caregiver about her food intake, nobody can give me a reliable answer Her prognosis is poor not just because of her age and comorbidities, but her social situation is causing additional problem of her getting reliable treatment and overall I felt that there is jeopardizing her health Ultimately, I explained to her and her family that they need to make a  decision whether she wants to be observed with repeat imaging study in 3 months versus getting radiation oncology consultation to evaluate for possible radiation therapy Her daughter who is present will call with final decision  Pancytopenia, acquired [facility]) Her pancytopenia has improved since discontinuation of treatment Observe closely  Chronic kidney disease, stage 4 (severe) ([place]) She has acute on chronic renal failure again likely secondary to poor oral intake I have sent a message to her primary care doctor  She might have to be referred back to nephrologist for evaluation  Essential hypertension She continues to have poorly controlled blood pressure She is on losartan and bumetanide which is generally not good choices in the setting of renal failure I have sent a message to her primary care doctor alerting her about this She might have to be seen by nephrologist I warned the patient and family members importance of controlling blood pressure and increasing oral fluid intake to avoid complete renal failure  Weight loss She continues to have gradual weight loss Her daughter felt that she is eating better but overall, she continues to have progressive weight loss In the past, I have asked family members to document her oral intake but this has not been done    No orders of the defined types were placed in this encounter.   All questions were answered. The patient knows to call the clinic with any problems, questions or concerns. The total time spent in the appointment was 40 minutes encounter with patients including review of chart and various tests results, discussions about plan of care and coordination of care plan   Heath Lark, MD 05/03/2020 1:17  PM  INTERVAL HISTORY: Please see below for problem oriented charting. She returns with her daughter for further follow-up Her daughter lives with her now In June, there is plan for her to move to [place] to live with her son,  Darnell Level Her son is currently working out of state but eventually when his contract ends, the family prefers the patient to be living with her son Over the past 2 months, her appointment has been rescheduled because she could not keep her appointment The patient originally was in [place] and then moved to [place], and then moved to [place], and then now back to [place] Her daughter felt that she is eating better although she has progressive weight loss She could not tell me about her day-to-day oral fluid intake and oral fluid intake She denies pain  SUMMARY OF ONCOLOGIC HISTORY: Oncology History Overview Note  MMR IHC normal, MSI-stable HER2 negative (1+)   History of left breast cancer  Endometrial cancer ([place])  08/23/2019 Initial Biopsy   D&C A. ENDOMETRIUM, CURETTAGE:  -  High-grade carcinoma  -  See comment  COMMENT:  The biopsy consists of a high-grade carcinoma.  Based on the biopsy the differential diagnosis includes serous, high-grade endometrioid and undifferentiated carcinoma.  These results were discussed with Dr. Assunta Curtis nurse, Juliann Pulse, on August 24, 2019.    08/27/2019 Initial Diagnosis   Endometrial cancer ([place])   09/01/2019 Imaging   CT C/A/P: 1. Enlarged uterus and heterogeneous endometrial cavity, consistent with the given diagnosis of uterine/cervical cancer. Small to borderline enlarged pelvic retroperitoneal lymph nodes are highly worrisome for metastatic disease. 2. Borderline enlarged thoracic inlet and mediastinal lymph nodes also worrisome for malignancy. 3. Tiny subpleural nodules are likely benign subpleural lymph nodes. Recommend attention on follow-up. 4. Aortic atherosclerosis (ICD10-I70.0). Coronary artery calcification.   09/09/2019 Surgery   Robotic-assisted laparoscopic total hysterectomy with bilateral salpingoophorectomy, SLN biopsy, right pelvic and para-aortic lymphadenectomy  On EUA, bulky enlarged uterus, mobile. Normal upper  abdominal survey although prominent gallbladder and some filmy adhesions between the liver and anterior abdominal wall. Normal appearing omentum, small and large bowel. Uterus quite bulky and 10-12cm. Normal appearing adnexa. Mapping successful to right obturator and left external iliac SLNs. Multiple obturator lymph nodes on the right, an external iliac lymph node on the right, and lymph nodes along the common iliac and aorta on the right all enlarged, adherent to surround vessels, and filled with tumor. All grossly involved lymph nodes removed.   09/09/2019 Pathology Results   A. SENTINEL LYMPH NODE, RIGHT OBTURATOR, ADJACENT ENLARGED LYMPH NODE,  EXCISION:  - Metastatic serous carcinoma, see comment   B. LYMPH NODE, RIGHT EXTERNAL ILIAC, BIOPSY:  - Benign lymph node, negative for carcinoma (0/1)   C. SENTINEL LYMPH NODE, LEFT EXTERNAL ILIAC, BIOPSY:  - Benign lymph node, negative for carcinoma (0/1)   D. 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 UTERUS, CERVIX, BILATERAL TUBE AND OVARIES:  - Invasive high-grade serous carcinoma, 8.2 cm  - Carcinoma invades for depth of 2.0 cm with a myometrial thickness is  2.2 cm  - Carcinoma invades into the cervical stroma  - Extensive lymphovascular invasion is present  - Benign unremarkable bilateral fallopian tubes and ovaries  - See oncology table   F. 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 LYMPH NODES, RIGHT COMMON AND PERIAORTIC, RESECTION:  - Metastatic  serous carcinoma, see comment      COMMENT:   A.   Lymphoid tissue is not identified.  Findings likely represent an  entirely replaced lymph node(s).   G. Multiple fragments of lymph node(s) were received. The exact number  of lymph nodes cannot be determined but does not affect the nodal stage.     ONCOLOGY TABLE:   UTERUS, CARCINOMA OR CARCINOSARCOMA    Procedure: Total hysterectomy and bilateral salpingo-oophorectomy  Histologic type: Serous carcinoma  Histologic Grade: High-grade  Myometrial invasion:       Depth of invasion: 20 mm       Myometrial thickness: 22 mm  Uterine Serosa Involvement: Not identified  Cervical stromal involvement: Present  Extent of involvement of other organs: Not identified  Lymphovascular invasion: Present, extensive  Regional Lymph Nodes:        Examined:        At least 3 Sentinel                               At least 3 Non-sentinel                               At least 6 Total        Lymph nodes with metastasis: 4        Isolated tumor cells (<0.2 mm): 0        Micrometastasis: (>0.2 mm and < 2.0 mm): 0        Macrometastasis: (>2.0 mm): 4        Extracapsular extension: Present  Representative Tumor Block: E8  MMR / MSI testing: Will be ordered  Pathologic Stage Classification (pTNM, AJCC 8th edition):  pT2, pN2a    09/09/2019 Tumor Marker   Patient's tumor was tested for the following markers: CA-125. Results of the tumor marker test revealed: 181.   09/09/2019 Cancer Staging   Staging form: Corpus Uteri - Carcinoma and Carcinosarcoma, AJCC 8th Edition - Clinical stage from 09/09/2019: FIGO Stage IIIC2, calculated as Stage IVB (cT2, cN2a, pM1) - Signed by Lafonda Mosses, MD on 09/15/2019   10/11/2019 Procedure   Successful placement of a power injectable Port-A-Cath via the left internal jugular vein. The catheter is ready for immediate use. Prominent right cervical lymphadenopathy and occlusion of the right internal jugular vein precluded right-sided placement.       10/21/2019 - 02/24/2020 Chemotherapy   The patient had carboplatin and taxol for chemotherapy treatment with significant dose reduction and modified schedule due to social circumstances    05/02/2020 Imaging   1. Mixed response following hysterectomy. Near complete resolution of lymph nodes in the chest and in the pelvis but  with new intra-aortocaval lymph node as described. 2. Fullness at the LEFT vaginal apex tracking towards LEFT adnexa in the pelvis may be postoperative. This will be a baseline for future follow-up. At this time residual disease is not excluded based on appearance. 3. Scattered pulmonary nodules and pleural and parenchymal scarring, likely benign and/or related to prior therapy as described. Attention on follow-up. 4. Signs of coronary artery disease. 5. Renal cortical scarring on the RIGHT greater than LEFT. 6. Aortic atherosclerosis.       REVIEW OF SYSTEMS:   Constitutional: Denies fevers, chills  Eyes: Denies blurriness of vision Ears, nose, mouth, throat, and face: Denies mucositis or sore throat Respiratory: Denies cough, dyspnea or wheezes  Cardiovascular: Denies palpitation, chest discomfort or lower extremity swelling Gastrointestinal:  Denies nausea, heartburn or change in bowel habits Skin: Denies abnormal skin rashes Lymphatics: Denies new lymphadenopathy or easy bruising Neurological:Denies numbness, tingling or new weaknesses Behavioral/Psych: Mood is stable, no new changes  All other systems were reviewed with the patient and are negative.  I have reviewed the past medical history, past surgical history, social history and family history with the patient and they are unchanged from previous note.  ALLERGIES:  is allergic to shellfish allergy and neomy-bacit-polymyx-pramoxine.  MEDICATIONS:  Current Outpatient Medications  Medication Sig Dispense Refill  . acetaminophen (TYLENOL) 500 MG tablet Take 1,000 mg by mouth every 6 (six) hours as needed for moderate pain.     Marland Kitchen amLODipine (NORVASC) 10 MG tablet Take 1 tablet (10 mg total) by mouth daily. 30 tablet 0  . aspirin 81 MG EC tablet Take 81 mg by mouth daily.     . bumetanide (BUMEX) 2 MG tablet Take 1 tablet (2 mg total) by mouth daily. 30 tablet 0  . busPIRone (BUSPAR) 10 MG tablet Take 1 tablet (10 mg total) by  mouth 2 (two) times daily. 180 tablet 1  . Carboxymethylcellulose Sodium (EYE DROPS OP) Place 1 drop into both eyes daily as needed (dry eyes).     Marland Kitchen Cod Liver Oil 1000 MG CAPS Take 1,000 mg by mouth 2 (two) times daily.     Marland Kitchen donepezil (ARICEPT) 5 MG tablet Take one tablet by mouth daily 90 tablet 1  . hydrALAZINE (APRESOLINE) 100 MG tablet Take 1 tablet (100 mg total) by mouth 3 (three) times daily. 90 tablet 0  . levothyroxine (SYNTHROID) 75 MCG tablet Take one tablet by mouth once daily 30 minutes before breakfast for thyroid 90 tablet 1  . losartan (COZAAR) 100 MG tablet Take 1 tablet (100 mg total) by mouth daily. 90 tablet 1  . metoprolol succinate (TOPROL-XL) 100 MG 24 hr tablet Take with or immediately following a meal. 180 tablet 1  . Multiple Vitamin (MULTIVITAMIN ADULT PO) Take 1 tablet by mouth daily.    . sennosides-docusate sodium (SENOKOT-S) 8.6-50 MG tablet Take 1 tablet by mouth as needed for constipation.    . traZODone (DESYREL) 50 MG tablet Take one tablet by mouth once daily at bedtime 90 tablet 1   No current facility-administered medications for this visit.    PHYSICAL EXAMINATION: ECOG PERFORMANCE STATUS: 2 - Symptomatic, <50% confined to bed  Vitals:   05/03/20 1252  BP: (!) 156/35  Pulse: 73  Resp: 18  Temp: (!) 97.4 F (36.3 C)  SpO2: 100%   Filed Weights   05/03/20 1252  Weight: 153 lb 3.2 oz (69.5 kg)    GENERAL:alert, no distress and comfortable.  She looks thin   NEURO: alert & oriented x 3 with fluent speech, no focal motor/sensory deficits  LABORATORY DATA:  I have reviewed the data as listed    Component Value Date/Time   NA 141 05/02/2020 1233   K 4.1 05/02/2020 1233   CL 102 05/02/2020 1233   CO2 27 05/02/2020 1233   GLUCOSE 82 05/02/2020 1233   BUN 36 (H) 05/02/2020 1233   CREATININE 2.43 (H) 05/02/2020 1233   CREATININE 1.78 (H) 09/29/2019 1100   CALCIUM 9.7 05/02/2020 1233   PROT 8.0 05/02/2020 1233   ALBUMIN 4.0 05/02/2020  1233   AST 22 05/02/2020 1233   ALT 14 05/02/2020 1233   ALKPHOS 38 05/02/2020 1233   BILITOT 0.4  05/02/2020 1233   GFRNONAA 19 (L) 05/02/2020 1233   GFRNONAA 26 (L) 09/29/2019 1100   GFRAA 15 (L) 10/13/2019 1312   GFRAA 30 (L) 09/29/2019 1100    No results found for: SPEP, UPEP  Lab Results  Component Value Date   WBC 3.7 (L) 05/02/2020   NEUTROABS 2.2 05/02/2020   HGB 10.0 (L) 05/02/2020   HCT 30.3 (L) 05/02/2020   MCV 102.0 (H) 05/02/2020   PLT 179 05/02/2020      Chemistry      Component Value Date/Time   NA 141 05/02/2020 1233   K 4.1 05/02/2020 1233   CL 102 05/02/2020 1233   CO2 27 05/02/2020 1233   BUN 36 (H) 05/02/2020 1233   CREATININE 2.43 (H) 05/02/2020 1233   CREATININE 1.78 (H) 09/29/2019 1100      Component Value Date/Time   CALCIUM 9.7 05/02/2020 1233   ALKPHOS 38 05/02/2020 1233   AST 22 05/02/2020 1233   ALT 14 05/02/2020 1233   BILITOT 0.4 05/02/2020 1233       RADIOGRAPHIC STUDIES: I have reviewed multiple imaging studies with the patient and family I have personally reviewed the radiological images as listed and agreed with the findings in the report. CT Abdomen Pelvis Wo Contrast  Result Date: 05/03/2020 CLINICAL DATA:  Endometrial cancer diagnosed 7 months ago by report post oral therapy. EXAM: CT CHEST, ABDOMEN AND PELVIS WITHOUT CONTRAST TECHNIQUE: Multidetector CT imaging of the chest, abdomen and pelvis was performed following the standard protocol without IV contrast. COMPARISON:  September 01, 2019 FINDINGS: CT CHEST FINDINGS Cardiovascular: Calcified atheromatous plaque of the thoracic aorta. No aneurysmal dilation. The LEFT-sided Port-A-Cath terminates at the caval to atrial junction. Heart size is normal without pericardial effusion. Signs of coronary artery disease as before. Central pulmonary vasculature normal caliber. Limited assessment of cardiovascular structures given lack of intravenous contrast. Mediastinum/Nodes: Mildly patulous  esophagus. Thoracic inlet structures are normal. No axillary lymphadenopathy. Post LEFT breast lumpectomy. No mediastinal lymphadenopathy. No gross hilar lymphadenopathy. RIGHT paratracheal lymph nodes and other mediastinal lymph nodes have markedly diminished in size. Superior mediastinal lymph node in the anterior mediastinum on image 16 of series 2 measuring 5 mm, previous size approximately 12 mm short axis. Superior mediastinal lymph node along the high RIGHT paratracheal chain previously at 13 mm now 5 mm short axis (image 14, series 2) Lungs/Pleura: No effusion. No consolidation. Small subpleural nodules, some with calcification showing no change compared to the prior study. Airways are patent. Mild pleural and parenchymal scarring at the periphery of the LEFT upper lobe and lingula potentially related to prior radiation, also unchanged. Largest noncalcified nodule posterior RIGHT lower lobe superior segment on image 55 of series 6 is 5 mm Musculoskeletal: See below for full musculoskeletal details. CT ABDOMEN PELVIS FINDINGS Hepatobiliary: Normal noncontrast appearance of liver and biliary tree. No pericholecystic stranding. Pancreas: Normal contour. No signs of adjacent inflammation or gross ductal dilation. Spleen: Normal size and contour. Adrenals/Urinary Tract: Adrenal glands are normal. Renal cortical scarring on the RIGHT greater than LEFT is moderate. No hydronephrosis. No nephrolithiasis. No perinephric stranding. Urinary bladder with smooth contours, under distension limiting assessment. Stomach/Bowel: No acute gastrointestinal process. Small bowel normal caliber. Colon is largely stool filled without signs of dilation or inflammation. Appendix not visualized. No pericecal inflammatory changes Vascular/Lymphatic: Aortic atherosclerosis without aneurysmal dilation. Intra-aortocaval lymphadenopathy (image 60, series 2) 1.6 cm new enlarged lymph node in this location. Pelvic nodal enlargement has  improved considerably. The RIGHT external  iliac lymph node at the internal external bifurcation measuring 8 mm previously 10 mm short axis. Pelvic sidewall lymph nodes along the RIGHT pelvic sidewall have resolved since the previous study. Small lymph nodes tracking along the LEFT common and external iliac nodal chains are no longer enlarged. Reproductive: Post hysterectomy. Fullness of the LEFT vaginal apex tracking towards LEFT adnexa measuring 2.3 x 1.3 cm on image 100 of series 2. Other: No ascites. Musculoskeletal: Spinal degenerative changes. Signs of spinal fusion at L3-4 similar to the prior exam. No acute or destructive bone process. IMPRESSION: 1. Mixed response following hysterectomy. Near complete resolution of lymph nodes in the chest and in the pelvis but with new intra-aortocaval lymph node as described. 2. Fullness at the LEFT vaginal apex tracking towards LEFT adnexa in the pelvis may be postoperative. This will be a baseline for future follow-up. At this time residual disease is not excluded based on appearance. 3. Scattered pulmonary nodules and pleural and parenchymal scarring, likely benign and/or related to prior therapy as described. Attention on follow-up. 4. Signs of coronary artery disease. 5. Renal cortical scarring on the RIGHT greater than LEFT. 6. Aortic atherosclerosis. Aortic Atherosclerosis (ICD10-I70.0). Electronically Signed   By: Zetta Bills M.D.   On: 05/03/2020 07:25   CT Chest Wo Contrast  Result Date: 05/03/2020 CLINICAL DATA:  Endometrial cancer diagnosed 7 months ago by report post oral therapy. EXAM: CT CHEST, ABDOMEN AND PELVIS WITHOUT CONTRAST TECHNIQUE: Multidetector CT imaging of the chest, abdomen and pelvis was performed following the standard protocol without IV contrast. COMPARISON:  September 01, 2019 FINDINGS: CT CHEST FINDINGS Cardiovascular: Calcified atheromatous plaque of the thoracic aorta. No aneurysmal dilation. The LEFT-sided Port-A-Cath terminates at  the caval to atrial junction. Heart size is normal without pericardial effusion. Signs of coronary artery disease as before. Central pulmonary vasculature normal caliber. Limited assessment of cardiovascular structures given lack of intravenous contrast. Mediastinum/Nodes: Mildly patulous esophagus. Thoracic inlet structures are normal. No axillary lymphadenopathy. Post LEFT breast lumpectomy. No mediastinal lymphadenopathy. No gross hilar lymphadenopathy. RIGHT paratracheal lymph nodes and other mediastinal lymph nodes have markedly diminished in size. Superior mediastinal lymph node in the anterior mediastinum on image 16 of series 2 measuring 5 mm, previous size approximately 12 mm short axis. Superior mediastinal lymph node along the high RIGHT paratracheal chain previously at 13 mm now 5 mm short axis (image 14, series 2) Lungs/Pleura: No effusion. No consolidation. Small subpleural nodules, some with calcification showing no change compared to the prior study. Airways are patent. Mild pleural and parenchymal scarring at the periphery of the LEFT upper lobe and lingula potentially related to prior radiation, also unchanged. Largest noncalcified nodule posterior RIGHT lower lobe superior segment on image 55 of series 6 is 5 mm Musculoskeletal: See below for full musculoskeletal details. CT ABDOMEN PELVIS FINDINGS Hepatobiliary: Normal noncontrast appearance of liver and biliary tree. No pericholecystic stranding. Pancreas: Normal contour. No signs of adjacent inflammation or gross ductal dilation. Spleen: Normal size and contour. Adrenals/Urinary Tract: Adrenal glands are normal. Renal cortical scarring on the RIGHT greater than LEFT is moderate. No hydronephrosis. No nephrolithiasis. No perinephric stranding. Urinary bladder with smooth contours, under distension limiting assessment. Stomach/Bowel: No acute gastrointestinal process. Small bowel normal caliber. Colon is largely stool filled without signs of  dilation or inflammation. Appendix not visualized. No pericecal inflammatory changes Vascular/Lymphatic: Aortic atherosclerosis without aneurysmal dilation. Intra-aortocaval lymphadenopathy (image 60, series 2) 1.6 cm new enlarged lymph node in this location. Pelvic nodal enlargement has improved  considerably. The RIGHT external iliac lymph node at the internal external bifurcation measuring 8 mm previously 10 mm short axis. Pelvic sidewall lymph nodes along the RIGHT pelvic sidewall have resolved since the previous study. Small lymph nodes tracking along the LEFT common and external iliac nodal chains are no longer enlarged. Reproductive: Post hysterectomy. Fullness of the LEFT vaginal apex tracking towards LEFT adnexa measuring 2.3 x 1.3 cm on image 100 of series 2. Other: No ascites. Musculoskeletal: Spinal degenerative changes. Signs of spinal fusion at L3-4 similar to the prior exam. No acute or destructive bone process. IMPRESSION: 1. Mixed response following hysterectomy. Near complete resolution of lymph nodes in the chest and in the pelvis but with new intra-aortocaval lymph node as described. 2. Fullness at the LEFT vaginal apex tracking towards LEFT adnexa in the pelvis may be postoperative. This will be a baseline for future follow-up. At this time residual disease is not excluded based on appearance. 3. Scattered pulmonary nodules and pleural and parenchymal scarring, likely benign and/or related to prior therapy as described. Attention on follow-up. 4. Signs of coronary artery disease. 5. Renal cortical scarring on the RIGHT greater than LEFT. 6. Aortic atherosclerosis. Aortic Atherosclerosis (ICD10-I70.0). Electronically Signed   By: Zetta Bills M.D.   On: 05/03/2020 07:25

## 2020-05-03 NOTE — Assessment & Plan Note (Signed)
She has acute on chronic renal failure again likely secondary to poor oral intake I have sent a message to her primary care doctor  She might have to be referred back to nephrologist for evaluation

## 2020-05-03 NOTE — Telephone Encounter (Signed)
-----   Message from [name], NP sent at 05/03/2020  1:27 PM EDT ----- Regarding: FW: acute on chronic renal failure Will you please call the daughter and let her know we would like pt  to follow up with her nephrologist, she expressed during her last OV that she had not followed up recently  ----- Message ----- From: Heath Lark, MD Sent: 05/03/2020   1:11 PM EDT To: [name], NP Subject: RE: acute on chronic renal failure             In my experience there is a lot of non-compliance She does not eat and drink regularly I told her to touch base with you, if you feel she needs to see a nephrologist, please refer her  Thanks ----- Message ----- From: [name], NP Sent: 05/03/2020   1:07 PM EDT To: Heath Lark, MD Subject: RE: acute on chronic renal failure             Thank you, these have been historically prescribed by her nephrologist who she has not seen in a while. It sounds like she needs to get back into see them and likely needs adjustments in that.  Thank you ----- Message ----- From: Heath Lark, MD Sent: 05/03/2020  12:58 PM EDT To: [name], NP Subject: acute on chronic renal failure                 Hi,  I just got labs from yesterday She has worsening renal failure, likely due to dehydration I want to bring it to your attention because she is on Bumex and losartan  Thanks

## 2020-05-03 NOTE — Assessment & Plan Note (Addendum)
She has high risk disease Her treatment was delayed multiple times due to her social situations The CT imaging showed regression of lymphadenopathy in her chest reflecting positive response to treatment but that would also means she probably have stage IV disease several months ago She has new lymphadenopathy in the para-aortic region and possible disease in the vagina cuff that would warrant further evaluation and treatment The most important issue is whether the patient is able to complete treatment in one location versus her moving out of state If she is able to get reliable caregivers to take care of her, my preference would be to refer her to radiation oncology for consultation for radiation therapy as part of her adjuvant treatment At current state, I do not believe she has reliable caregiver All her other comorbidities are also not adequately treated She has lost a lot of weight since she started treatment When I saw her last year, she was 176 pounds She has progressive weight loss due to lack of appetite and not eating and drinking enough In every visit, a different caregiver is present and whenever I question the caregiver about her food intake, nobody can give me a reliable answer Her prognosis is poor not just because of her age and comorbidities, but her social situation is causing additional problem of her getting reliable treatment and overall I felt that there is jeopardizing her health Ultimately, I explained to her and her family that they need to make a decision whether she wants to be observed with repeat imaging study in 3 months versus getting radiation oncology consultation to evaluate for possible radiation therapy Her daughter who is present will call with final decision

## 2020-05-03 NOTE — Assessment & Plan Note (Signed)
She continues to have poorly controlled blood pressure She is on losartan and bumetanide which is generally not good choices in the setting of renal failure I have sent a message to her primary care doctor alerting her about this She might have to be seen by nephrologist I warned the patient and family members importance of controlling blood pressure and increasing oral fluid intake to avoid complete renal failure

## 2020-05-04 NOTE — Telephone Encounter (Signed)
Patient's daughter called and I explained to her that Sherrie Mustache, NP recommends that she follow up with neurologist.

## 2020-05-08 ENCOUNTER — Other Ambulatory Visit: Payer: Self-pay | Admitting: Oncology

## 2020-05-08 ENCOUNTER — Telehealth: Payer: Self-pay | Admitting: Oncology

## 2020-05-08 DIAGNOSIS — N2581 Secondary hyperparathyroidism of renal origin: Secondary | ICD-10-CM | POA: Diagnosis not present

## 2020-05-08 DIAGNOSIS — N189 Chronic kidney disease, unspecified: Secondary | ICD-10-CM | POA: Diagnosis not present

## 2020-05-08 DIAGNOSIS — N184 Chronic kidney disease, stage 4 (severe): Secondary | ICD-10-CM | POA: Diagnosis not present

## 2020-05-08 DIAGNOSIS — I129 Hypertensive chronic kidney disease with stage 1 through stage 4 chronic kidney disease, or unspecified chronic kidney disease: Secondary | ICD-10-CM | POA: Diagnosis not present

## 2020-05-08 DIAGNOSIS — C541 Malignant neoplasm of endometrium: Secondary | ICD-10-CM

## 2020-05-08 DIAGNOSIS — D631 Anemia in chronic kidney disease: Secondary | ICD-10-CM | POA: Diagnosis not present

## 2020-05-08 NOTE — Progress Notes (Signed)
Gynecologic Oncology Multi-Disciplinary Disposition Conference Note  Date of the Conference: 05/08/2020  Patient Name: Regina Porter  Primary GYN Oncologist: Dr. 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 Disposition is to refer to radiation oncology for consideration for palliative radiation therapy.   This Multidisciplinary conference took place involving physicians from [place], [place], Radiation Oncology, Pathology, Radiology along with the Gynecologic Oncology Nurse Practitioner and RN.  Comprehensive assessment of the patient's malignancy, staging, need for surgery, chemotherapy, radiation therapy, and need for further testing were reviewed. Supportive measures, both inpatient and following discharge were also discussed. The recommended plan of care is documented. Greater than 35 minutes were spent correlating and coordinating this patient's care.

## 2020-05-08 NOTE — Telephone Encounter (Signed)
Called Elder Love (daughter) and advised her of the [facility] recommendations for Radiation Oncology referral.  Advised her of appointment with Dr. Sondra Come on 05/15/2020.  She verbalized agreement and asked for the appointment information to be emailed to her as well.

## 2020-05-09 ENCOUNTER — Other Ambulatory Visit: Payer: Self-pay | Admitting: Nurse Practitioner

## 2020-05-09 ENCOUNTER — Telehealth: Payer: Self-pay | Admitting: Oncology

## 2020-05-09 NOTE — Telephone Encounter (Signed)
[name] called and said Regina Porter has decided not to do radiation.  They want to cancel the appointment on 05/15/20 with Dr. Sondra Come.  She perfers the option for observation with repeat imaging in 3 months.

## 2020-05-09 NOTE — Telephone Encounter (Signed)
She will not be here in 3 months, based on our previous discussion she will be moving to [place] in June Please call her family and inform them that she would need to establish care in [facility] with new oncologist

## 2020-05-10 NOTE — Telephone Encounter (Signed)
Called [name] back and advised her of message below from Dr. Alvy Bimler.  Shiloh will be going to [place] to live with her son, Darnell Level, at the end of June, early July.  Recommended that they set up an appointment now with the oncologist in [place] to establish care.  Gave her my number to call if they need any assistance setting up the appointment.  [name] verbalized understanding and will let Bruce know to contact me with any questions.

## 2020-05-15 ENCOUNTER — Ambulatory Visit: Payer: Medicare Other | Admitting: Radiation Oncology

## 2020-05-15 ENCOUNTER — Ambulatory Visit: Payer: Medicare Other

## 2020-05-22 DIAGNOSIS — D631 Anemia in chronic kidney disease: Secondary | ICD-10-CM | POA: Diagnosis not present

## 2020-05-22 DIAGNOSIS — N184 Chronic kidney disease, stage 4 (severe): Secondary | ICD-10-CM | POA: Diagnosis not present

## 2020-05-22 DIAGNOSIS — I129 Hypertensive chronic kidney disease with stage 1 through stage 4 chronic kidney disease, or unspecified chronic kidney disease: Secondary | ICD-10-CM | POA: Diagnosis not present

## 2020-05-22 DIAGNOSIS — N2581 Secondary hyperparathyroidism of renal origin: Secondary | ICD-10-CM | POA: Diagnosis not present

## 2020-05-23 ENCOUNTER — Other Ambulatory Visit: Payer: Self-pay | Admitting: Nurse Practitioner

## 2020-05-23 DIAGNOSIS — I10 Essential (primary) hypertension: Secondary | ICD-10-CM

## 2020-05-23 NOTE — Telephone Encounter (Signed)
4. Essential hypertension -controlled at today's visit. Does not check bp at home.  -continue current medication regimen and low sodium diet.  - metoprolol succinate (TOPROL-XL) 100 MG 24 hr tablet; Take with or immediately following a meal.  Dispense: 180 tablet; Refill: 1 - losartan (COZAAR) 100 MG tablet; Take 1 tablet (100 mg total) by mouth daily.  Dispense: 90 tablet; Refill    Please Clarify Directions per pharmacy.

## 2020-07-11 DIAGNOSIS — N2581 Secondary hyperparathyroidism of renal origin: Secondary | ICD-10-CM | POA: Diagnosis not present

## 2020-07-11 DIAGNOSIS — D631 Anemia in chronic kidney disease: Secondary | ICD-10-CM | POA: Diagnosis not present

## 2020-07-11 DIAGNOSIS — C541 Malignant neoplasm of endometrium: Secondary | ICD-10-CM | POA: Diagnosis not present

## 2020-07-11 DIAGNOSIS — I129 Hypertensive chronic kidney disease with stage 1 through stage 4 chronic kidney disease, or unspecified chronic kidney disease: Secondary | ICD-10-CM | POA: Diagnosis not present

## 2020-07-11 DIAGNOSIS — N184 Chronic kidney disease, stage 4 (severe): Secondary | ICD-10-CM | POA: Diagnosis not present

## 2020-07-14 ENCOUNTER — Encounter: Payer: Self-pay | Admitting: Hematology and Oncology

## 2020-08-01 ENCOUNTER — Telehealth: Payer: Self-pay | Admitting: Oncology

## 2020-08-01 DIAGNOSIS — Z8542 Personal history of malignant neoplasm of other parts of uterus: Secondary | ICD-10-CM | POA: Diagnosis not present

## 2020-08-01 DIAGNOSIS — H6990 Unspecified Eustachian tube disorder, unspecified ear: Secondary | ICD-10-CM | POA: Diagnosis not present

## 2020-08-01 DIAGNOSIS — F411 Generalized anxiety disorder: Secondary | ICD-10-CM | POA: Diagnosis not present

## 2020-08-01 DIAGNOSIS — I10 Essential (primary) hypertension: Secondary | ICD-10-CM | POA: Diagnosis not present

## 2020-08-01 DIAGNOSIS — N184 Chronic kidney disease, stage 4 (severe): Secondary | ICD-10-CM | POA: Diagnosis not present

## 2020-08-01 DIAGNOSIS — G3184 Mild cognitive impairment, so stated: Secondary | ICD-10-CM | POA: Diagnosis not present

## 2020-08-01 DIAGNOSIS — G4700 Insomnia, unspecified: Secondary | ICD-10-CM | POA: Diagnosis not present

## 2020-08-01 DIAGNOSIS — E039 Hypothyroidism, unspecified: Secondary | ICD-10-CM | POA: Diagnosis not present

## 2020-08-01 DIAGNOSIS — Z6828 Body mass index (BMI) 28.0-28.9, adult: Secondary | ICD-10-CM | POA: Diagnosis not present

## 2020-08-01 NOTE — Telephone Encounter (Signed)
Regina Porter (son) called and said that Regina Porter is now living with him in [place].  They need all her records sent to [facility] (request scanned in to [facility]) and a CD of her imaging.  Advised him that the records request has been sent to medical records and that will have a CD ready to be picked up tomorrow.  He said he will have his sister come by to pick it up.

## 2020-08-02 DIAGNOSIS — N184 Chronic kidney disease, stage 4 (severe): Secondary | ICD-10-CM | POA: Diagnosis not present

## 2020-08-02 DIAGNOSIS — E039 Hypothyroidism, unspecified: Secondary | ICD-10-CM | POA: Diagnosis not present

## 2020-08-02 DIAGNOSIS — I10 Essential (primary) hypertension: Secondary | ICD-10-CM | POA: Diagnosis not present

## 2020-08-08 DIAGNOSIS — Z90710 Acquired absence of both cervix and uterus: Secondary | ICD-10-CM | POA: Diagnosis not present

## 2020-08-08 DIAGNOSIS — Z01818 Encounter for other preprocedural examination: Secondary | ICD-10-CM | POA: Diagnosis not present

## 2020-08-08 DIAGNOSIS — C541 Malignant neoplasm of endometrium: Secondary | ICD-10-CM | POA: Diagnosis not present

## 2020-08-08 DIAGNOSIS — Z90722 Acquired absence of ovaries, bilateral: Secondary | ICD-10-CM | POA: Diagnosis not present

## 2020-08-08 DIAGNOSIS — I129 Hypertensive chronic kidney disease with stage 1 through stage 4 chronic kidney disease, or unspecified chronic kidney disease: Secondary | ICD-10-CM | POA: Diagnosis not present

## 2020-08-08 DIAGNOSIS — Z79899 Other long term (current) drug therapy: Secondary | ICD-10-CM | POA: Diagnosis not present

## 2020-08-08 DIAGNOSIS — K219 Gastro-esophageal reflux disease without esophagitis: Secondary | ICD-10-CM | POA: Diagnosis not present

## 2020-08-08 DIAGNOSIS — N9089 Other specified noninflammatory disorders of vulva and perineum: Secondary | ICD-10-CM | POA: Diagnosis not present

## 2020-08-08 DIAGNOSIS — Z87891 Personal history of nicotine dependence: Secondary | ICD-10-CM | POA: Diagnosis not present

## 2020-08-08 DIAGNOSIS — Z801 Family history of malignant neoplasm of trachea, bronchus and lung: Secondary | ICD-10-CM | POA: Diagnosis not present

## 2020-08-08 DIAGNOSIS — N184 Chronic kidney disease, stage 4 (severe): Secondary | ICD-10-CM | POA: Diagnosis not present

## 2020-08-08 DIAGNOSIS — Z9221 Personal history of antineoplastic chemotherapy: Secondary | ICD-10-CM | POA: Diagnosis not present

## 2020-08-08 DIAGNOSIS — C799 Secondary malignant neoplasm of unspecified site: Secondary | ICD-10-CM | POA: Diagnosis not present

## 2020-08-08 DIAGNOSIS — R590 Localized enlarged lymph nodes: Secondary | ICD-10-CM | POA: Diagnosis not present

## 2020-08-08 DIAGNOSIS — G4730 Sleep apnea, unspecified: Secondary | ICD-10-CM | POA: Diagnosis not present

## 2020-08-08 DIAGNOSIS — Z803 Family history of malignant neoplasm of breast: Secondary | ICD-10-CM | POA: Diagnosis not present

## 2020-08-08 DIAGNOSIS — R519 Headache, unspecified: Secondary | ICD-10-CM | POA: Diagnosis not present

## 2020-08-16 DIAGNOSIS — Z452 Encounter for adjustment and management of vascular access device: Secondary | ICD-10-CM | POA: Diagnosis not present

## 2020-08-16 DIAGNOSIS — C541 Malignant neoplasm of endometrium: Secondary | ICD-10-CM | POA: Diagnosis not present

## 2020-08-31 DIAGNOSIS — H6990 Unspecified Eustachian tube disorder, unspecified ear: Secondary | ICD-10-CM | POA: Diagnosis not present

## 2020-08-31 DIAGNOSIS — M1990 Unspecified osteoarthritis, unspecified site: Secondary | ICD-10-CM | POA: Diagnosis not present

## 2020-08-31 DIAGNOSIS — G4700 Insomnia, unspecified: Secondary | ICD-10-CM | POA: Diagnosis not present

## 2020-08-31 DIAGNOSIS — I10 Essential (primary) hypertension: Secondary | ICD-10-CM | POA: Diagnosis not present

## 2020-08-31 DIAGNOSIS — Z0000 Encounter for general adult medical examination without abnormal findings: Secondary | ICD-10-CM | POA: Diagnosis not present

## 2020-08-31 DIAGNOSIS — G3184 Mild cognitive impairment, so stated: Secondary | ICD-10-CM | POA: Diagnosis not present

## 2020-08-31 DIAGNOSIS — F411 Generalized anxiety disorder: Secondary | ICD-10-CM | POA: Diagnosis not present

## 2020-08-31 DIAGNOSIS — Z6828 Body mass index (BMI) 28.0-28.9, adult: Secondary | ICD-10-CM | POA: Diagnosis not present

## 2020-08-31 DIAGNOSIS — E039 Hypothyroidism, unspecified: Secondary | ICD-10-CM | POA: Diagnosis not present

## 2020-09-08 DIAGNOSIS — R591 Generalized enlarged lymph nodes: Secondary | ICD-10-CM | POA: Diagnosis not present

## 2020-09-08 DIAGNOSIS — Z79899 Other long term (current) drug therapy: Secondary | ICD-10-CM | POA: Diagnosis not present

## 2020-09-08 DIAGNOSIS — Z01818 Encounter for other preprocedural examination: Secondary | ICD-10-CM | POA: Diagnosis not present

## 2020-09-08 DIAGNOSIS — C541 Malignant neoplasm of endometrium: Secondary | ICD-10-CM | POA: Diagnosis not present

## 2020-09-12 DIAGNOSIS — C775 Secondary and unspecified malignant neoplasm of intrapelvic lymph nodes: Secondary | ICD-10-CM | POA: Diagnosis not present

## 2020-09-12 DIAGNOSIS — Z801 Family history of malignant neoplasm of trachea, bronchus and lung: Secondary | ICD-10-CM | POA: Diagnosis not present

## 2020-09-12 DIAGNOSIS — C541 Malignant neoplasm of endometrium: Secondary | ICD-10-CM | POA: Diagnosis not present

## 2020-09-12 DIAGNOSIS — N184 Chronic kidney disease, stage 4 (severe): Secondary | ICD-10-CM | POA: Diagnosis not present

## 2020-09-12 DIAGNOSIS — Z803 Family history of malignant neoplasm of breast: Secondary | ICD-10-CM | POA: Diagnosis not present

## 2020-09-12 DIAGNOSIS — Z7982 Long term (current) use of aspirin: Secondary | ICD-10-CM | POA: Diagnosis not present

## 2020-09-12 DIAGNOSIS — I129 Hypertensive chronic kidney disease with stage 1 through stage 4 chronic kidney disease, or unspecified chronic kidney disease: Secondary | ICD-10-CM | POA: Diagnosis not present

## 2020-09-12 DIAGNOSIS — Z79899 Other long term (current) drug therapy: Secondary | ICD-10-CM | POA: Diagnosis not present

## 2020-09-12 DIAGNOSIS — Z87891 Personal history of nicotine dependence: Secondary | ICD-10-CM | POA: Diagnosis not present

## 2020-09-12 DIAGNOSIS — K219 Gastro-esophageal reflux disease without esophagitis: Secondary | ICD-10-CM | POA: Diagnosis not present

## 2020-11-02 ENCOUNTER — Other Ambulatory Visit: Payer: Self-pay | Admitting: Nurse Practitioner

## 2020-11-02 DIAGNOSIS — G4700 Insomnia, unspecified: Secondary | ICD-10-CM

## 2020-11-02 NOTE — Telephone Encounter (Signed)
Pharmacy requested refill.  Patient needs an appointment before anymore future refills noted.

## 2020-11-09 ENCOUNTER — Other Ambulatory Visit: Payer: Self-pay | Admitting: Nurse Practitioner

## 2020-11-09 DIAGNOSIS — G3184 Mild cognitive impairment, so stated: Secondary | ICD-10-CM

## 2020-11-09 DIAGNOSIS — E039 Hypothyroidism, unspecified: Secondary | ICD-10-CM

## 2020-11-09 NOTE — Telephone Encounter (Signed)
Called patients daughter to inquire PCP status. [place] from another state.   Patient is overdue for a follow-up. Patients daughter stastes she is not sure if her mother is visiting [place], [place] or planning on staying long-term.   Elder Love also states she is not sure why the pharmacy sent request to [place].

## 2020-11-24 ENCOUNTER — Other Ambulatory Visit: Payer: Self-pay | Admitting: Nurse Practitioner

## 2020-11-24 DIAGNOSIS — F419 Anxiety disorder, unspecified: Secondary | ICD-10-CM

## 2020-11-24 DIAGNOSIS — I10 Essential (primary) hypertension: Secondary | ICD-10-CM

## 2021-01-31 ENCOUNTER — Other Ambulatory Visit: Payer: Self-pay | Admitting: Nurse Practitioner

## 2021-01-31 DIAGNOSIS — G4700 Insomnia, unspecified: Secondary | ICD-10-CM

## 2021-10-15 ENCOUNTER — Encounter: Payer: Self-pay | Admitting: Gastroenterology

## 2022-12-04 ENCOUNTER — Encounter: Payer: Self-pay | Admitting: Hematology and Oncology

## 2022-12-04 NOTE — Telephone Encounter (Signed)
Telephone call  

## 2023-07-15 DEATH — deceased
